# Patient Record
Sex: Female | Born: 1950
Health system: Southern US, Community
[De-identification: ages and names within clinical notes are randomized; demographics above are authoritative.]

## PROBLEM LIST (undated history)

## (undated) DIAGNOSIS — M1990 Unspecified osteoarthritis, unspecified site: Secondary | ICD-10-CM

## (undated) DIAGNOSIS — R51 Headache: Secondary | ICD-10-CM

## (undated) DIAGNOSIS — I499 Cardiac arrhythmia, unspecified: Secondary | ICD-10-CM

## (undated) DIAGNOSIS — M109 Gout, unspecified: Secondary | ICD-10-CM

## (undated) DIAGNOSIS — R011 Cardiac murmur, unspecified: Secondary | ICD-10-CM

## (undated) DIAGNOSIS — J189 Pneumonia, unspecified organism: Secondary | ICD-10-CM

## (undated) DIAGNOSIS — D649 Anemia, unspecified: Secondary | ICD-10-CM

## (undated) DIAGNOSIS — E039 Hypothyroidism, unspecified: Secondary | ICD-10-CM

## (undated) DIAGNOSIS — R0602 Shortness of breath: Secondary | ICD-10-CM

## (undated) DIAGNOSIS — K219 Gastro-esophageal reflux disease without esophagitis: Secondary | ICD-10-CM

## (undated) DIAGNOSIS — E119 Type 2 diabetes mellitus without complications: Secondary | ICD-10-CM

## (undated) DIAGNOSIS — K759 Inflammatory liver disease, unspecified: Secondary | ICD-10-CM

## (undated) DIAGNOSIS — N052 Unspecified nephritic syndrome with diffuse membranous glomerulonephritis: Secondary | ICD-10-CM

## (undated) DIAGNOSIS — I6529 Occlusion and stenosis of unspecified carotid artery: Secondary | ICD-10-CM

## (undated) DIAGNOSIS — I209 Angina pectoris, unspecified: Secondary | ICD-10-CM

## (undated) DIAGNOSIS — E7800 Pure hypercholesterolemia, unspecified: Secondary | ICD-10-CM

## (undated) DIAGNOSIS — I10 Essential (primary) hypertension: Secondary | ICD-10-CM

## (undated) DIAGNOSIS — G4730 Sleep apnea, unspecified: Secondary | ICD-10-CM

## (undated) HISTORY — PX: TONSILLECTOMY: SUR1361

## (undated) HISTORY — PX: BUNIONECTOMY: SHX129

## (undated) HISTORY — PX: RENAL BIOPSY: SHX156

## (undated) HISTORY — PX: CHOLECYSTECTOMY: SHX55

---
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
## 2001-06-08 ENCOUNTER — Encounter: Payer: Self-pay | Admitting: Family Medicine

## 2001-06-08 ENCOUNTER — Ambulatory Visit (HOSPITAL_COMMUNITY): Admission: RE | Admit: 2001-06-08 | Discharge: 2001-06-08 | Payer: Self-pay | Admitting: Family Medicine

## 2001-06-21 ENCOUNTER — Ambulatory Visit (HOSPITAL_COMMUNITY): Admission: RE | Admit: 2001-06-21 | Discharge: 2001-06-21 | Payer: Self-pay | Admitting: General Surgery

## 2001-10-09 ENCOUNTER — Ambulatory Visit (HOSPITAL_COMMUNITY): Admission: RE | Admit: 2001-10-09 | Discharge: 2001-10-10 | Payer: Self-pay | Admitting: Nephrology

## 2002-06-08 ENCOUNTER — Ambulatory Visit (HOSPITAL_COMMUNITY): Admission: RE | Admit: 2002-06-08 | Discharge: 2002-06-08 | Payer: Self-pay | Admitting: Family Medicine

## 2002-06-08 ENCOUNTER — Encounter: Payer: Self-pay | Admitting: Family Medicine

## 2003-06-13 ENCOUNTER — Encounter: Payer: Self-pay | Admitting: Family Medicine

## 2003-06-13 ENCOUNTER — Ambulatory Visit (HOSPITAL_COMMUNITY): Admission: RE | Admit: 2003-06-13 | Discharge: 2003-06-13 | Payer: Self-pay | Admitting: Family Medicine

## 2003-07-09 ENCOUNTER — Ambulatory Visit (HOSPITAL_COMMUNITY): Admission: RE | Admit: 2003-07-09 | Discharge: 2003-07-09 | Payer: Self-pay | Admitting: *Deleted

## 2003-08-15 ENCOUNTER — Ambulatory Visit (HOSPITAL_COMMUNITY): Admission: RE | Admit: 2003-08-15 | Discharge: 2003-08-15 | Payer: Self-pay | Admitting: Obstetrics and Gynecology

## 2004-08-27 ENCOUNTER — Ambulatory Visit (HOSPITAL_COMMUNITY): Admission: RE | Admit: 2004-08-27 | Discharge: 2004-08-27 | Payer: Self-pay | Admitting: Family Medicine

## 2005-07-02 ENCOUNTER — Inpatient Hospital Stay (HOSPITAL_COMMUNITY): Admission: EM | Admit: 2005-07-02 | Discharge: 2005-07-04 | Payer: Self-pay | Admitting: Emergency Medicine

## 2005-08-31 ENCOUNTER — Ambulatory Visit (HOSPITAL_COMMUNITY): Admission: RE | Admit: 2005-08-31 | Discharge: 2005-08-31 | Payer: Self-pay | Admitting: Family Medicine

## 2006-06-09 ENCOUNTER — Other Ambulatory Visit: Admission: RE | Admit: 2006-06-09 | Discharge: 2006-06-09 | Payer: Self-pay | Admitting: Obstetrics and Gynecology

## 2006-07-07 ENCOUNTER — Ambulatory Visit (HOSPITAL_COMMUNITY): Admission: RE | Admit: 2006-07-07 | Discharge: 2006-07-07 | Payer: Self-pay | Admitting: General Surgery

## 2006-09-02 ENCOUNTER — Ambulatory Visit (HOSPITAL_COMMUNITY): Admission: RE | Admit: 2006-09-02 | Discharge: 2006-09-02 | Payer: Self-pay | Admitting: Family Medicine

## 2006-11-20 ENCOUNTER — Emergency Department (HOSPITAL_COMMUNITY): Admission: EM | Admit: 2006-11-20 | Discharge: 2006-11-20 | Payer: Self-pay | Admitting: Emergency Medicine

## 2006-12-29 ENCOUNTER — Encounter (HOSPITAL_COMMUNITY): Admission: RE | Admit: 2006-12-29 | Discharge: 2007-01-28 | Payer: Self-pay | Admitting: Internal Medicine

## 2007-01-03 ENCOUNTER — Ambulatory Visit (HOSPITAL_COMMUNITY): Admission: RE | Admit: 2007-01-03 | Discharge: 2007-01-03 | Payer: Self-pay | Admitting: Internal Medicine

## 2007-06-29 ENCOUNTER — Ambulatory Visit (HOSPITAL_COMMUNITY): Admission: RE | Admit: 2007-06-29 | Discharge: 2007-06-29 | Payer: Self-pay | Admitting: Family Medicine

## 2007-08-24 HISTORY — PX: SHOULDER ARTHROSCOPY W/ ROTATOR CUFF REPAIR: SHX2400

## 2007-09-05 ENCOUNTER — Ambulatory Visit (HOSPITAL_COMMUNITY): Admission: RE | Admit: 2007-09-05 | Discharge: 2007-09-05 | Payer: Self-pay | Admitting: Obstetrics and Gynecology

## 2007-12-12 ENCOUNTER — Ambulatory Visit (HOSPITAL_COMMUNITY): Admission: RE | Admit: 2007-12-12 | Discharge: 2007-12-12 | Payer: Self-pay | Admitting: Family Medicine

## 2008-05-31 ENCOUNTER — Ambulatory Visit (HOSPITAL_BASED_OUTPATIENT_CLINIC_OR_DEPARTMENT_OTHER): Admission: RE | Admit: 2008-05-31 | Discharge: 2008-06-01 | Payer: Self-pay | Admitting: Orthopedic Surgery

## 2008-07-15 ENCOUNTER — Encounter (HOSPITAL_COMMUNITY): Admission: RE | Admit: 2008-07-15 | Discharge: 2008-08-21 | Payer: Self-pay | Admitting: Orthopedic Surgery

## 2008-07-19 ENCOUNTER — Ambulatory Visit (HOSPITAL_COMMUNITY): Admission: RE | Admit: 2008-07-19 | Discharge: 2008-07-19 | Payer: Self-pay | Admitting: Family Medicine

## 2008-08-26 ENCOUNTER — Encounter (HOSPITAL_COMMUNITY): Admission: RE | Admit: 2008-08-26 | Discharge: 2008-09-25 | Payer: Self-pay | Admitting: Orthopedic Surgery

## 2008-09-11 ENCOUNTER — Ambulatory Visit (HOSPITAL_COMMUNITY): Admission: RE | Admit: 2008-09-11 | Discharge: 2008-09-11 | Payer: Self-pay | Admitting: Family Medicine

## 2008-09-26 ENCOUNTER — Encounter (HOSPITAL_COMMUNITY): Admission: RE | Admit: 2008-09-26 | Discharge: 2008-10-26 | Payer: Self-pay | Admitting: Orthopedic Surgery

## 2008-10-29 ENCOUNTER — Encounter (HOSPITAL_COMMUNITY): Admission: RE | Admit: 2008-10-29 | Discharge: 2008-11-01 | Payer: Self-pay | Admitting: Orthopedic Surgery

## 2009-02-02 ENCOUNTER — Emergency Department (HOSPITAL_COMMUNITY): Admission: EM | Admit: 2009-02-02 | Discharge: 2009-02-02 | Payer: Self-pay | Admitting: Emergency Medicine

## 2009-03-17 ENCOUNTER — Ambulatory Visit (HOSPITAL_COMMUNITY): Admission: RE | Admit: 2009-03-17 | Discharge: 2009-03-17 | Payer: Self-pay | Admitting: Family Medicine

## 2009-09-22 ENCOUNTER — Ambulatory Visit (HOSPITAL_COMMUNITY)
Admission: RE | Admit: 2009-09-22 | Discharge: 2009-09-22 | Payer: Self-pay | Source: Home / Self Care | Admitting: Family Medicine

## 2010-09-10 ENCOUNTER — Other Ambulatory Visit (HOSPITAL_COMMUNITY): Payer: Self-pay | Admitting: Obstetrics and Gynecology

## 2010-09-10 DIAGNOSIS — Z139 Encounter for screening, unspecified: Secondary | ICD-10-CM

## 2010-09-24 ENCOUNTER — Ambulatory Visit (HOSPITAL_COMMUNITY): Admission: RE | Admit: 2010-09-24 | Payer: 59 | Source: Home / Self Care | Admitting: Obstetrics and Gynecology

## 2010-09-24 ENCOUNTER — Ambulatory Visit (HOSPITAL_COMMUNITY)
Admission: RE | Admit: 2010-09-24 | Discharge: 2010-09-24 | Disposition: A | Discharge status: Home / Self Care | Payer: 59 | Source: Ambulatory Visit | Attending: Obstetrics and Gynecology | Admitting: Obstetrics and Gynecology

## 2010-09-24 DIAGNOSIS — Z1231 Encounter for screening mammogram for malignant neoplasm of breast: Secondary | ICD-10-CM | POA: Insufficient documentation

## 2010-09-24 DIAGNOSIS — Z139 Encounter for screening, unspecified: Secondary | ICD-10-CM

## 2011-01-05 NOTE — Op Note (Signed)
NAMEFELICIE, [name]                  ACCOUNT NO.:  [id number]   MEDICAL RECORD NO.:  [id number]          PATIENT TYPE:  AMB   LOCATION:  [place]                          FACILITY:  [facility]   PHYSICIAN:  Johnny Bridge, MD    DATE OF BIRTH:  February 04, 1951   DATE OF PROCEDURE:  05/31/2008  DATE OF DISCHARGE:                               OPERATIVE REPORT   PREOPERATIVE DIAGNOSIS:  Right shoulder rotator cuff tear and  impingement with acromioclavicular joint arthritis.   POSTOPERATIVE DIAGNOSES:  1. Right shoulder rotator cuff tear and impingement with      acromioclavicular joint arthritis.  2. Biceps tendinosis.   OPERATIVE PROCEDURE:  Right shoulder arthroscopy with rotator cuff  repair, acromioplasty, distal clavicle excision, and biceps tenolysis.   ANESTHESIA:  General with interscalene block.   OPERATIVE IMPLANTS:  Arthrex PEEK 5.5 anchors x2 and PushLock 4.5  anchors x2 for lateral row.   PREOPERATIVE INDICATIONS:  Ms. [name] is a 60 year old woman who  complained of right shoulder pain.  She had impingement as well as  rotator cuff tear, AC joint arthritis.  She elected to undergo the above-  named procedures.  The risks, benefits, and alternatives were discussed  with her preoperatively including but not limited to risks of infection,  bleeding, nerve injury, recurrent rotator cuff tear, stiffness, adhesive  capsulitis, recurrent pain, cardiopulmonary complications, blood clots,  among others and she was willing to proceed.   OPERATIVE FINDINGS:  The glenohumeral articular surface was normal.  The  labrum was intact.  The biceps had significant fraying especially along  the intertubercular groove portion when pulled into the joint.  There  was at least 50% involvement of the tendon also with synovitis and  tendinosis going distally.  For this reason, I performed a biceps  tenolysis, because I felt that the biceps would likely continue to  degenerate and cause pain  postoperatively if left as it was.   The rotator cuff had a 2 x 2 cm tear in the supraspinatus.  Infraspinatus and subscapularis were intact.  The quality of the tissue  was reasonable, although not great.  There was a large subacromial spur.  [facility] joint had significant arthritis.   OPERATIVE PROCEDURE:  The patient was brought to the operating room and  placed in a supine position.  General anesthesia was administered.  Regional block had already been administered.  She was turned in a  sloppy lateral decubitus position, and diagnostic arthroscopy was  carried out after sterile prep and drape.  The above-named findings were  noted.  The biceps was tenolysed.  The shaver was utilized to debride  the bone on the greater tuberosity in order to prepare a bony bed for  the rotator cuff repair.  We then debrided the portion of the rotator  cuff of the worst tissue.  We placed a total of 2 anchors for the medial  row and passed then using modified Mason-Allen type stitch.  We then  anchored the tendon to the bone.  We then placed 2 lateral PushLock  anchors utilizing  the suture.  Excellent recreation of the footprint was  achieved.   We then performed an acromioplasty and released the CA ligament.  The  bone spur was excised.  We then used the bur to perform a distal  clavicle excision.  Confirmation was made of acceptable resection on  multiple views from multiple portals.  We then irrigated the shoulder  out copiously and closed the skin with Monocryl and Steri-Strips and  sterile gauze.  The patient was awakened and returned to PACU in stable  and satisfactory condition.  There were no complications.  The patient  tolerated the procedure well.      Johnny Bridge, MD  Electronically Signed     JPL/MEDQ  D:  05/31/2008  T:  06/01/2008  Job:  [id number]

## 2011-01-08 NOTE — H&P (Signed)
NAMEJANECIA, [name]                  ACCOUNT NO.:  [id number]   MEDICAL RECORD NO.:  [id number]          PATIENT TYPE:  INP   LOCATION:  A213                          FACILITY:  [facility]   PHYSICIAN:  Bonne Dolores, M.D.    DATE OF BIRTH:  09/20/1950   DATE OF ADMISSION:  07/02/2005  DATE OF DISCHARGE:  LH                                HISTORY & PHYSICAL   CHIEF COMPLAINT:  Chest pain.   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 She is managed by  nephrology in [place]. She also has a history of recurring chest pain and  has undergone Cardiolite stress testing most recently two years ago and has  been followed by Dr. Mathis Bud. She underwent a Doppler of her carotid  arteries within the past month for a bruit, the results of which are  unknown.   PAST SURGICAL HISTORY:  1.  Significant for cholecystitis.  2.  Cesarean section x3.  3.  Bunionectomy x2.  4.  Lasix surgery.  5.  Tonsillectomy.   PAST MEDICAL HISTORY:  1.  She also has a history of gastroesophageal reflux disease.  2.  She is treated chronically for hyperlipidemia as well, medication      regimen noted below.  3.  She has a history of hypothyroidism, compensated.   The patient presented to the emergency department with a one-hour history of  facial flushing, hard heart beat with a heart rate of 100 per her  measurement, right shoulder and upper arm discomfort as well as chest  tightness. She has associated dyspnea which was mild. However, she  experienced no diaphoresis, nausea, syncope.   Emergency room evaluation was essentially benign except for mild  hypokalemia. Cardiac enzymes negative. EKG essentially normal.   The patient is admitted with chest pain and rule out MI protocol. Given her  history and risk factors (noted below), cardiac catheterization will  probably be required.   There is no history of headache,  neurological deficits, nausea, vomiting,  diarrhea, melena, hematemesis, hematochezia or genitourinary symptoms.   MEDICATIONS:  1.  Lisinopril 40 mg p.o. b.i.d.  2.  Protonix 40 mg daily.  3.  Synthroid 0.125 daily.  4.  Prempro 3/1.5 daily.  5.  Lasix 20 mg daily.  6.  Toprol-XL 50 daily.  7.  Aspirin 325 daily.  8.  Lipitor 80 daily.  9.  TriCor 145 nightly.  10. Micardis 80 mg daily.  11. Nitroglycerin p.r.n.  12. Fiorinal p.r.n.   ALLERGIES:  None known.   PAST HISTORY:  As noted above. She also has a history of anemia in the  remote past.   SOCIAL HISTORY:  Nonsmoker, nondrinker. Supportive family.   FAMILY HISTORY:  Family history is significant for multiple members of her  father's family with coronary artery disease. Her father also had coronary  artery disease. Her mother died of nonalcoholic related cirrhosis of the  liver at age 84.   REVIEW OF SYSTEMS:  Negative except as mentioned.   PHYSICAL EXAMINATION:  GENERAL:  Very pleasant, black female who is no acute  distress at this time. Her color is good.  VITAL SIGNS:  At presentation, BP 137/62, temperature 98.7, pulse 82 and  regular, respirations 20. O2 saturation 98%.  HEENT:  Normocephalic, atraumatic. Pupils are equal. Ears, nose, and throat  benign.  NECK:  Supple. I am unable to appreciate a bruit on either carotid.  LUNGS:  Clear.  HEART:  Normal without murmurs, rubs, or gallops.  ABDOMEN:  Nontender, nondistended. Bowel sounds were intact.  EXTREMITIES:  No clubbing, cyanosis, or edema.  NEUROLOGICAL:  Nonfocal.   LABORATORY DATA:  As noted. Second set of cardiac enzymes were negative.  Repeat EKG:  No acute change.   ASSESSMENT:  Chest pain associated with mild tachy palpitations of  questionable etiology. Consider atypical angina pectoris. This has been  recurring over the past several years and cardiac catheterization probably  indicated.   PLAN:  Will continue current medications. Rule out  MI protocol. Ask  cardiology to see in consultation. Consider transfer to [facility]  for emergent catheterization should her symptoms recur. Will follow and  treat expectantly.     Bonne Dolores, M.D.  Electronically Signed    MC/MEDQ  D:  07/03/2005  T:  07/03/2005  Job:  [id number]

## 2011-01-08 NOTE — Discharge Summary (Signed)
Katherine, Burke                  ACCOUNT NO.:  [id number]   MEDICAL RECORD NO.:  [id number]          PATIENT TYPE:  INP   LOCATION:  A213                          FACILITY:  [facility]   PHYSICIAN:  Bonne Dolores, M.D.    DATE OF BIRTH:  November 21, 1950   DATE OF ADMISSION:  07/02/2005  DATE OF DISCHARGE:  11/12/2006LH                                 DISCHARGE SUMMARY   DISCHARGE DIAGNOSES:  1.  Chest pain of questionable etiology, rule out myocardial infarction      protocol negative, symptoms resolved.  2.  History biopsy-proven membranous glomerulonephritis, stable.  3.  History of cholecystitis.  4.  Cesarean section x3.  5.  Bunionectomy x2.  6.  History of LASIK surgery.  7.  History of tonsillectomy.  8.  Gastroesophageal reflux disease, controlled.  9.  Chronic hyperlipidemia.  10. Hypothyroidism, compensated.  11. Hypertension, controlled.   HISTORY AND PHYSICAL:  For details regarding admission please refer to the  admitting note.  Briefly this 60 year old female with above history  presented to the emergency department with a 1-hour history of facial  flushing and a hard heartbeat.  She also experienced right shoulder and  upper arm discomfort as well as chest tightness.  She also had associated  dyspnea which was mild.  She experienced no diaphoresis, nausea, or syncope.   In the emergency department her evaluation was benign except for a mild  hypokalemia.  Cardiac enzymes negative.  EKG normal.   Patient was admitted with chest pain and rule out MI protocol.   COURSE IN THE HOSPITAL:  The patient was treated routinely, rule out  myocardial infarction protocol was negative.  Her medications were  unchanged.  She was anxious for discharge after 36 hours and was painfree.  Of note she is a patient of Dr. Mathis Bud, cardiac catheterization is  probably indicated however outpatient workup may be undertaken as per  cardiology.   DISPOSITION:  Medications include lisinopril 40  mg twice daily, Protonix 40  daily, Synthroid 125 mcg daily, Prempro 3/1.5 daily, Lasix 40 daily, Toprol  XL 50 daily, aspirin 325 daily, Lipitor 80 daily, Tricor 145 mg daily and  Micardis 80 mg daily, nitroglycerin p.r.n., Fiorinal p.r.n., fish oil one  tab daily.      Bonne Dolores, M.D.  Electronically Signed     MC/MEDQ  D:  07/25/2005  T:  07/25/2005  Job:  [id number]

## 2011-01-08 NOTE — H&P (Signed)
[name], [name]                  ACCOUNT NO.:  [id number]   MEDICAL RECORD NO.:  [id number]          PATIENT TYPE:  AMB   LOCATION:                                FACILITY:  [facility]   PHYSICIAN:  Jamesetta So, M.D.  DATE OF BIRTH:  [birth year]   DATE OF ADMISSION:  DATE OF DISCHARGE:  LH                                HISTORY & PHYSICAL   CHIEF COMPLAINT:  Diverticulosis, abdominal pain.   HISTORY OF PRESENT ILLNESS:  The patient is a 60 year old white female who  is referred for endoscopic evaluation.  She needs a colonoscopy for a  history of diverticulosis and left lower quadrant abdominal pain.  She last  had a colonoscopy in 2000 by Dr. Tamala Julian, she was told she had diverticulosis  at that time.  She has been having intermittent left lower quadrant  abdominal pain, though no fevers, chills, or diarrhea.  No weight loss,  nausea, vomiting, constipation, melena, or hematochezia have been noted.  There is no family history of colon carcinoma.  Past medical history  includes hypothyroidism, hypertension, coronary artery disease.   PAST SURGICAL HISTORY:  Cholecystectomy, bunionectomy, temperature, two C-  sections.   CURRENT MEDICATIONS:  Lisinopril, Synthroid, Protonix, Lasix, Prempro,  aspirin, Micardis, Tricor, Lipitor, lisinopril, Cardizem.   ALLERGIES:  CODEINE, NONSTEROIDAL ANTI-INFLAMMATORY DRUGS.   REVIEW OF SYSTEMS:  Noncontributory.   PHYSICAL EXAMINATION:  On physical examination, the patient is well-  developed, well-nourished white female in no acute distress.  LUNGS:  Clear to auscultation with equal breath sounds bilaterally.  HEART:  Examination reveals regular rate and rhythm without S3, S4, or  murmurs.  The abdomen is soft, nontender, nondistended.  No hepatosplenomegaly or  masses are noted.  RECTAL:  Examination was deferred to the procedure.   IMPRESSION:  Diverticulosis, abdominal pain.   PLAN:  The patient is scheduled for a colonoscopy on June 28, 2006.  Risks and benefits of the procedure including bleeding and perforation were  fully explained to the patient, who gave informed consent.      Jamesetta So, M.D.  Electronically Signed     MAJ/MEDQ  D:  06/07/2006  T:  06/08/2006  Job:  [id number]   cc:   [facility]  Fax: [phone]   Halford Chessman, M.D.  Fax: [phone]

## 2011-02-11 ENCOUNTER — Encounter (HOSPITAL_COMMUNITY): Payer: Self-pay

## 2011-02-11 ENCOUNTER — Other Ambulatory Visit (HOSPITAL_COMMUNITY): Payer: Self-pay | Admitting: Family Medicine

## 2011-02-11 ENCOUNTER — Ambulatory Visit (HOSPITAL_COMMUNITY)
Admission: RE | Admit: 2011-02-11 | Discharge: 2011-02-11 | Disposition: A | Discharge status: Home / Self Care | Payer: 59 | Source: Ambulatory Visit | Attending: Family Medicine | Admitting: Family Medicine

## 2011-02-11 DIAGNOSIS — M25569 Pain in unspecified knee: Secondary | ICD-10-CM

## 2011-02-11 DIAGNOSIS — S99919A Unspecified injury of unspecified ankle, initial encounter: Secondary | ICD-10-CM | POA: Insufficient documentation

## 2011-02-11 DIAGNOSIS — W19XXXA Unspecified fall, initial encounter: Secondary | ICD-10-CM | POA: Insufficient documentation

## 2011-02-11 DIAGNOSIS — S8990XA Unspecified injury of unspecified lower leg, initial encounter: Secondary | ICD-10-CM | POA: Insufficient documentation

## 2011-02-11 DIAGNOSIS — S99929A Unspecified injury of unspecified foot, initial encounter: Secondary | ICD-10-CM | POA: Insufficient documentation

## 2011-05-25 LAB — BASIC METABOLIC PANEL
BUN: 31 — ABNORMAL HIGH
CO2: 27
Calcium: 10
Chloride: 108
Creatinine, Ser: 1.11
GFR calc Af Amer: >60
GFR calc non Af Amer: 51 — ABNORMAL LOW
Glucose, Bld: 114 — ABNORMAL HIGH
Potassium: 4.4
Sodium: 140

## 2011-05-25 LAB — POCT HEMOGLOBIN-HEMACUE: Hemoglobin: 12

## 2011-05-25 LAB — GLUCOSE, CAPILLARY: Glucose-Capillary: 169 — ABNORMAL HIGH

## 2011-08-25 ENCOUNTER — Other Ambulatory Visit (HOSPITAL_COMMUNITY): Payer: Self-pay | Admitting: Family Medicine

## 2011-08-25 DIAGNOSIS — Z139 Encounter for screening, unspecified: Secondary | ICD-10-CM

## 2011-09-28 ENCOUNTER — Ambulatory Visit (HOSPITAL_COMMUNITY)
Admission: RE | Admit: 2011-09-28 | Discharge: 2011-09-28 | Disposition: A | Discharge status: Home / Self Care | Payer: 59 | Source: Ambulatory Visit | Attending: Family Medicine | Admitting: Family Medicine

## 2011-09-28 DIAGNOSIS — Z1231 Encounter for screening mammogram for malignant neoplasm of breast: Secondary | ICD-10-CM | POA: Insufficient documentation

## 2011-09-28 DIAGNOSIS — Z139 Encounter for screening, unspecified: Secondary | ICD-10-CM

## 2011-11-07 ENCOUNTER — Emergency Department (HOSPITAL_COMMUNITY)
Admission: EM | Admit: 2011-11-07 | Discharge: 2011-11-07 | Disposition: A | Discharge status: Home / Self Care | Payer: 59 | Attending: Emergency Medicine | Admitting: Emergency Medicine

## 2011-11-07 ENCOUNTER — Encounter (HOSPITAL_COMMUNITY): Payer: Self-pay | Admitting: Emergency Medicine

## 2011-11-07 DIAGNOSIS — E079 Disorder of thyroid, unspecified: Secondary | ICD-10-CM | POA: Insufficient documentation

## 2011-11-07 DIAGNOSIS — K529 Noninfective gastroenteritis and colitis, unspecified: Secondary | ICD-10-CM

## 2011-11-07 DIAGNOSIS — K5289 Other specified noninfective gastroenteritis and colitis: Secondary | ICD-10-CM | POA: Insufficient documentation

## 2011-11-07 DIAGNOSIS — E119 Type 2 diabetes mellitus without complications: Secondary | ICD-10-CM | POA: Insufficient documentation

## 2011-11-07 DIAGNOSIS — I10 Essential (primary) hypertension: Secondary | ICD-10-CM | POA: Insufficient documentation

## 2011-11-07 DIAGNOSIS — E7800 Pure hypercholesterolemia, unspecified: Secondary | ICD-10-CM | POA: Insufficient documentation

## 2011-11-07 DIAGNOSIS — N289 Disorder of kidney and ureter, unspecified: Secondary | ICD-10-CM | POA: Insufficient documentation

## 2011-11-07 HISTORY — DX: Pure hypercholesterolemia, unspecified: E78.00

## 2011-11-07 HISTORY — DX: Essential (primary) hypertension: I10

## 2011-11-07 LAB — CBC
HCT: 37.4 % (ref 36.0–46.0)
Hemoglobin: 13.1 g/dL (ref 12.0–15.0)
MCHC: 35 g/dL (ref 30.0–36.0)
MCV: 87.2 fL (ref 78.0–100.0)
RBC: 4.29 MIL/uL (ref 3.87–5.11)
WBC: 6.7 10*3/uL (ref 4.0–10.5)

## 2011-11-07 LAB — COMPREHENSIVE METABOLIC PANEL
ALT: 52 U/L — ABNORMAL HIGH (ref 0–35)
AST: 36 U/L (ref 0–37)
Albumin: 3.3 g/dL — ABNORMAL LOW (ref 3.5–5.2)
Alkaline Phosphatase: 43 U/L (ref 39–117)
BUN: 18 mg/dL (ref 6–23)
CO2: 22 mEq/L (ref 19–32)
Chloride: 107 mEq/L (ref 96–112)
GFR calc non Af Amer: 60 mL/min — ABNORMAL LOW (ref >90)
Glucose, Bld: 134 mg/dL — ABNORMAL HIGH (ref 70–99)
Potassium: 3.5 mEq/L (ref 3.5–5.1)
Total Bilirubin: 0.3 mg/dL (ref 0.3–1.2)

## 2011-11-07 LAB — DIFFERENTIAL
Basophils Relative: 0 % (ref 0–1)
Eosinophils Absolute: 0.2 10*3/uL (ref 0.0–0.7)
Lymphocytes Relative: 26 % (ref 12–46)
Monocytes Relative: 9 % (ref 3–12)
Neutro Abs: 4.2 10*3/uL (ref 1.7–7.7)
Neutrophils Relative %: 62 % (ref 43–77)

## 2011-11-07 MED ORDER — PROMETHAZINE HCL 25 MG PO TABS: 25.0000 mg | ORAL_TABLET | Freq: Four times a day (QID) | ORAL | Status: AC | PRN

## 2011-11-07 NOTE — ED Provider Notes (Signed)
This chart was scribed for [name], MD by Toniann Ket. The patient was seen in room APA05/APA05 and the patient's care was started at 9:11 AM.   CSN: [id number]  Arrival date & time 11/07/11  0851   First MD Initiated Contact with Patient 11/07/11 [phone]      Chief Complaint  Patient presents with  . Nausea  . Diarrhea    (Consider location/radiation/quality/duration/timing/severity/associated sxs/prior treatment) Patient is a 61 y.o. female presenting with diarrhea. The history is provided by the patient. No language interpreter was used.  Diarrhea The primary symptoms include fever, fatigue, nausea, vomiting and diarrhea. Primary symptoms do not include abdominal pain or rash. The illness began 3 to 5 days ago. The onset was sudden. The problem has been gradually worsening.  Nausea began 3 to 5 days ago.  The illness is also significant for chills. The illness does not include back pain. Associated medical issues do not include gastric bypass. Risk factors: none.   [name] is a 61 y.o. female who presents to the Emergency Department complaining of sudden onset, waxing and waning, gradually worsening, moderate nausea onset 4 days ago that began after pt came home from home from work. Pt c/o associated chills, emesis, fatigue, dry heaves,  mild diarrhea, intermittent low-grade fever.  Pt stated that yesterday she was feeling better, but sx have worsened today. Pt denies any sick contacts.There are no other associated symptoms and no other alleviating or aggravating factors.  Pt w/ renal disorder and DM.   PCP: Hilma Favors Past Medical History  Diagnosis Date  . Renal disorder   . Diabetes mellitus   . Hypertension   . High cholesterol   . Thyroid disease     Past Surgical History  Procedure Date  . Cholecystectomy   . Cesarean section   . Rotator cuff repair   . Bunionectomy     No family history on file.  History  Substance Use Topics  . Smoking status:  Never Smoker   . Smokeless tobacco: Not on file  . Alcohol Use: No    OB History    Grav Para Term Preterm Abortions TAB SAB Ect Mult Living                  Review of Systems  Constitutional: Positive for fever, chills and fatigue.  HENT: Negative for congestion, sinus pressure and ear discharge.   Eyes: Negative for discharge.  Respiratory: Negative for cough.   Cardiovascular: Negative for chest pain.  Gastrointestinal: Positive for nausea, vomiting and diarrhea. Negative for abdominal pain.  Genitourinary: Negative for frequency and hematuria.  Musculoskeletal: Negative for back pain.  Skin: Negative for rash.  Neurological: Negative for seizures and headaches.  Hematological: Negative.   Psychiatric/Behavioral: Negative for hallucinations.    Allergies  Codeine and Nsaids  Home Medications  No current outpatient prescriptions on file.  BP 122/55  Pulse 81  Temp 98.4 F (36.9 C)  Resp 18  Ht 5\' 3"  (1.6 m)  Wt 160 lb (72.576 kg)  BMI 28.34 kg/m2  SpO2 95%  Physical Exam  Constitutional: She is oriented to person, place, and time. She appears well-developed.  HENT:  Head: Normocephalic and atraumatic.  Eyes: Conjunctivae and EOM are normal. No scleral icterus.  Neck: Neck supple. No thyromegaly present.  Cardiovascular: Normal rate and regular rhythm.  Exam reveals no gallop and no friction rub.   No murmur heard. Pulmonary/Chest: No stridor. She has no wheezes. She has no  rales. She exhibits no tenderness.  Abdominal: She exhibits no distension. There is no tenderness. There is no rebound.  Musculoskeletal: Normal range of motion. She exhibits no edema.  Lymphadenopathy:    She has no cervical adenopathy.  Neurological: She is oriented to person, place, and time. Coordination normal.  Skin: No rash noted. No erythema.  Psychiatric: She has a normal mood and affect. Her behavior is normal.    ED Course  Procedures (including critical care  time) DIAGNOSTIC STUDIES: Oxygen Saturation is 95% on room air, adequate by my interpretation.    COORDINATION OF CARE:  11:49 AM: EDP at bedside. All results reviewed and discussed with pt, questions answered, pt agreeable with plan.   Labs Reviewed  COMPREHENSIVE METABOLIC PANEL - Abnormal; Notable for the following:    Glucose, Bld 134 (*)    Albumin 3.3 (*)    ALT 52 (*)    GFR calc non Af Amer 60 (*)    GFR calc Af Amer 70 (*)    All other components within normal limits  CBC  DIFFERENTIAL   No results found.   No diagnosis found.    MDM  gastroenteritis  The chart was scribed for me under my direct supervision.  I personally performed the history, physical, and medical decision making and all procedures in the evaluation of this patient.[name], MD 11/07/11 1153

## 2011-11-07 NOTE — ED Notes (Signed)
Family at bedside. Patient is comfortable. Doctor in at this time.

## 2011-11-07 NOTE — ED Notes (Signed)
Pt c/o intermittent n/d/dry heaves since Wednesday. denies pain

## 2011-11-07 NOTE — ED Notes (Signed)
Family at bedside. Patient walked to restroom and back with no assistance needed.

## 2011-11-07 NOTE — Discharge Instructions (Signed)
Drink plenty of fluids.  Follow up if any problems

## 2012-01-10 ENCOUNTER — Other Ambulatory Visit (HOSPITAL_COMMUNITY): Payer: Self-pay | Admitting: Family Medicine

## 2012-01-10 DIAGNOSIS — Z01419 Encounter for gynecological examination (general) (routine) without abnormal findings: Secondary | ICD-10-CM

## 2012-01-10 DIAGNOSIS — Z139 Encounter for screening, unspecified: Secondary | ICD-10-CM

## 2012-01-19 ENCOUNTER — Other Ambulatory Visit (HOSPITAL_COMMUNITY): Payer: 59

## 2012-01-19 ENCOUNTER — Inpatient Hospital Stay (HOSPITAL_COMMUNITY): Admission: RE | Admit: 2012-01-19 | Payer: 59 | Source: Ambulatory Visit

## 2012-01-24 ENCOUNTER — Ambulatory Visit (HOSPITAL_COMMUNITY)
Admission: RE | Admit: 2012-01-24 | Discharge: 2012-01-24 | Disposition: A | Discharge status: Home / Self Care | Payer: 59 | Source: Ambulatory Visit | Attending: Family Medicine | Admitting: Family Medicine

## 2012-01-24 DIAGNOSIS — Z13820 Encounter for screening for osteoporosis: Secondary | ICD-10-CM | POA: Insufficient documentation

## 2012-01-24 DIAGNOSIS — Z139 Encounter for screening, unspecified: Secondary | ICD-10-CM

## 2012-01-24 DIAGNOSIS — Z01419 Encounter for gynecological examination (general) (routine) without abnormal findings: Secondary | ICD-10-CM

## 2012-01-24 DIAGNOSIS — M899 Disorder of bone, unspecified: Secondary | ICD-10-CM | POA: Insufficient documentation

## 2012-09-05 ENCOUNTER — Other Ambulatory Visit (HOSPITAL_COMMUNITY): Payer: Self-pay | Admitting: Obstetrics and Gynecology

## 2012-09-05 DIAGNOSIS — IMO0001 Reserved for inherently not codable concepts without codable children: Secondary | ICD-10-CM

## 2012-09-29 ENCOUNTER — Ambulatory Visit (HOSPITAL_COMMUNITY)
Admission: RE | Admit: 2012-09-29 | Discharge: 2012-09-29 | Disposition: A | Discharge status: Home / Self Care | Payer: 59 | Source: Ambulatory Visit | Attending: Obstetrics and Gynecology | Admitting: Obstetrics and Gynecology

## 2012-09-29 DIAGNOSIS — Z1231 Encounter for screening mammogram for malignant neoplasm of breast: Secondary | ICD-10-CM | POA: Insufficient documentation

## 2012-09-29 DIAGNOSIS — IMO0001 Reserved for inherently not codable concepts without codable children: Secondary | ICD-10-CM

## 2012-10-29 ENCOUNTER — Emergency Department (HOSPITAL_COMMUNITY): Payer: 59

## 2012-10-29 ENCOUNTER — Other Ambulatory Visit: Payer: Self-pay

## 2012-10-29 ENCOUNTER — Encounter (HOSPITAL_COMMUNITY): Payer: Self-pay | Admitting: *Deleted

## 2012-10-29 ENCOUNTER — Emergency Department (HOSPITAL_COMMUNITY)
Admission: EM | Admit: 2012-10-29 | Discharge: 2012-10-29 | Disposition: A | Discharge status: Home / Self Care | Payer: 59 | Attending: Emergency Medicine | Admitting: Emergency Medicine

## 2012-10-29 DIAGNOSIS — J189 Pneumonia, unspecified organism: Secondary | ICD-10-CM | POA: Insufficient documentation

## 2012-10-29 DIAGNOSIS — Z79899 Other long term (current) drug therapy: Secondary | ICD-10-CM | POA: Insufficient documentation

## 2012-10-29 DIAGNOSIS — Z9089 Acquired absence of other organs: Secondary | ICD-10-CM | POA: Insufficient documentation

## 2012-10-29 DIAGNOSIS — M7989 Other specified soft tissue disorders: Secondary | ICD-10-CM | POA: Insufficient documentation

## 2012-10-29 DIAGNOSIS — R079 Chest pain, unspecified: Secondary | ICD-10-CM

## 2012-10-29 DIAGNOSIS — R911 Solitary pulmonary nodule: Secondary | ICD-10-CM | POA: Insufficient documentation

## 2012-10-29 DIAGNOSIS — E119 Type 2 diabetes mellitus without complications: Secondary | ICD-10-CM | POA: Insufficient documentation

## 2012-10-29 DIAGNOSIS — I10 Essential (primary) hypertension: Secondary | ICD-10-CM | POA: Insufficient documentation

## 2012-10-29 DIAGNOSIS — R350 Frequency of micturition: Secondary | ICD-10-CM | POA: Insufficient documentation

## 2012-10-29 DIAGNOSIS — R110 Nausea: Secondary | ICD-10-CM | POA: Insufficient documentation

## 2012-10-29 DIAGNOSIS — Z7982 Long term (current) use of aspirin: Secondary | ICD-10-CM | POA: Insufficient documentation

## 2012-10-29 DIAGNOSIS — E7800 Pure hypercholesterolemia, unspecified: Secondary | ICD-10-CM | POA: Insufficient documentation

## 2012-10-29 DIAGNOSIS — Z87448 Personal history of other diseases of urinary system: Secondary | ICD-10-CM | POA: Insufficient documentation

## 2012-10-29 DIAGNOSIS — E079 Disorder of thyroid, unspecified: Secondary | ICD-10-CM | POA: Insufficient documentation

## 2012-10-29 LAB — BASIC METABOLIC PANEL
BUN: 14 mg/dL (ref 6–23)
Chloride: 103 mEq/L (ref 96–112)
Creatinine, Ser: 1.06 mg/dL (ref 0.50–1.10)
GFR calc Af Amer: 65 mL/min — ABNORMAL LOW (ref >90)
GFR calc non Af Amer: 56 mL/min — ABNORMAL LOW (ref >90)

## 2012-10-29 LAB — CBC WITH DIFFERENTIAL/PLATELET
Basophils Relative: 1 % (ref 0–1)
Eosinophils Absolute: 0.4 10*3/uL (ref 0.0–0.7)
HCT: 38.3 % (ref 36.0–46.0)
Hemoglobin: 13.3 g/dL (ref 12.0–15.0)
MCH: 30.7 pg (ref 26.0–34.0)
MCHC: 34.7 g/dL (ref 30.0–36.0)
MCV: 88.5 fL (ref 78.0–100.0)
Monocytes Absolute: 0.6 10*3/uL (ref 0.1–1.0)
Monocytes Relative: 7 % (ref 3–12)

## 2012-10-29 MED ORDER — AZITHROMYCIN 250 MG PO TABS: 250.0000 mg | ORAL_TABLET | Freq: Every day | ORAL | Status: DC

## 2012-10-29 MED ORDER — SODIUM CHLORIDE 0.9 % IV BOLUS (SEPSIS)
500.0000 mL | Freq: Once | INTRAVENOUS | Status: AC
  Administered 2012-10-29: 1000 mL via INTRAVENOUS

## 2012-10-29 MED ORDER — IOHEXOL 350 MG/ML SOLN
100.0000 mL | Freq: Once | INTRAVENOUS | Status: AC | PRN
  Administered 2012-10-29: 100 mL via INTRAVENOUS

## 2012-10-29 NOTE — ED Provider Notes (Signed)
History    This chart was scribed for [employer]. Alvino Chapel, MD by Marin Comment, ED Scribe. The patient was seen in room APA05/APA05. Patient's care was started at [place].   CSN: [id number]  Arrival date & time 10/29/12  [id number]   First MD Initiated Contact with Patient 10/29/12 0831      Chief Complaint  Patient presents with  . Abdominal Pain    The history is provided by the patient. No language interpreter was used.   [name] is a 62 y.o. female who presents to the Emergency Department complaining of gradually worsening, constant, right lower anterior rib pain that started 3 days ago. She states that the pain started under her right breast and has gradually moved to around her right ribs. Her pain is aggravated with breathing and movement, but improved with sitting up. She reports associated nausea but denies any vomiting. She denies any h/o similar symptoms. She denies any falls or recent trauma. She denies any new SOB, cough, rashes. She has a h/o renal disorder and states that she has had increased leg swelling and urinary frequency recently. She reports a h/o cholecystectomy.   Past Medical History  Diagnosis Date  . Renal disorder   . Diabetes mellitus   . Hypertension   . High cholesterol   . Thyroid disease     Past Surgical History  Procedure Laterality Date  . Cholecystectomy    . Cesarean section    . Rotator cuff repair    . Bunionectomy      No family history on file.  History  Substance Use Topics  . Smoking status: Never Smoker   . Smokeless tobacco: Not on file  . Alcohol Use: No    OB History   Grav Para Term Preterm Abortions TAB SAB Ect Mult Living                  Review of Systems  Respiratory: Negative for shortness of breath.   Cardiovascular: Positive for leg swelling.       Right lower rib pain.  Gastrointestinal: Positive for nausea. Negative for vomiting and abdominal pain.  Genitourinary: Positive for frequency.  All other  systems reviewed and are negative.    Allergies  Codeine; Nsaids; and Shellfish allergy  Home Medications   Current Outpatient Rx  Name  Route  Sig  Dispense  Refill  . allopurinol (ZYLOPRIM) 100 MG tablet   Oral   Take 200 mg by mouth daily.         Marland Kitchen aspirin EC 325 MG tablet   Oral   Take 325 mg by mouth daily.         Marland Kitchen dexlansoprazole (DEXILANT) 60 MG capsule   Oral   Take 60 mg by mouth daily.         Marland Kitchen diltiazem (CARDIZEM CD) 240 MG 24 hr capsule   Oral   Take 240 mg by mouth at bedtime.         . fenofibrate (TRICOR) 145 MG tablet   Oral   Take 145 mg by mouth daily.         . furosemide (LASIX) 20 MG tablet   Oral   Take 20 mg by mouth daily.         Marland Kitchen levothyroxine (SYNTHROID, LEVOTHROID) 125 MCG tablet   Oral   Take 125 mcg by mouth daily.         Marland Kitchen linagliptin (TRADJENTA) 5 MG TABS tablet  Oral   Take 5 mg by mouth daily.         Marland Kitchen lisinopril (PRINIVIL,ZESTRIL) 40 MG tablet   Oral   Take 40 mg by mouth 2 (two) times daily.         . metoprolol succinate (TOPROL-XL) 25 MG 24 hr tablet   Oral   Take 37.5 mg by mouth daily.         . Nutritional Supplements (ESTROVEN) TABS   Oral   Take 1 tablet by mouth daily.         . Omega-3 Fatty Acids (FISH OIL) 1200 MG CAPS   Oral   Take 2 capsules by mouth daily.          . rosuvastatin (CRESTOR) 40 MG tablet   Oral   Take 40 mg by mouth at bedtime.         Marland Kitchen telmisartan (MICARDIS) 80 MG tablet   Oral   Take 160 mg by mouth daily.         Marland Kitchen azithromycin (ZITHROMAX) 250 MG tablet   Oral   Take 1 tablet (250 mg total) by mouth daily. Take first 2 tablets together, then 1 every day until finished.   6 tablet   0     BP 121/63  Pulse 72  Temp(Src) 97.6 F (36.4 C) (Oral)  Resp 18  Ht 5\' 3"  (1.6 m)  Wt 165 lb (74.844 kg)  BMI 29.24 kg/m2  SpO2 90%  Physical Exam  Nursing note and vitals reviewed. Constitutional: She is oriented to person, place, and time.  She appears well-developed and well-nourished. No distress.  HENT:  Head: Normocephalic and atraumatic.  Eyes: Conjunctivae and EOM are normal.  Neck: Normal range of motion. Neck supple. No tracheal deviation present.  Cardiovascular: Normal rate, regular rhythm and normal heart sounds.   Pulmonary/Chest: Effort normal and breath sounds normal. No respiratory distress. She exhibits tenderness.  Point tenderness present in the anterior right lower chest wall. No crepitus, deformities or rashes noted. Lungs are clear bilaterally.    Abdominal: Soft. She exhibits no distension. There is no tenderness.  No RUQ abdominal tenderness.   Musculoskeletal: Normal range of motion. She exhibits edema.  Mild edema bilaterally in the lower legs  Neurological: She is alert and oriented to person, place, and time.  Skin: Skin is warm and dry.  Psychiatric: She has a normal mood and affect. Her behavior is normal.    ED Course  Procedures (including critical care time)  DIAGNOSTIC STUDIES: Oxygen Saturation is 95% on room air, adequate by my interpretation.    COORDINATION OF CARE:  08:40-Discussed planned course of treatment with the patient including a chest x-ray, basic blood work and checking a d-dimer, who is agreeable at this time.   Results for orders placed during the hospital encounter of 10/29/12  CBC WITH DIFFERENTIAL      Result Value Range   WBC 8.4  4.0 - 10.5 K/uL   RBC 4.33  3.87 - 5.11 MIL/uL   Hemoglobin 13.3  12.0 - 15.0 g/dL   HCT 38.3  36.0 - 46.0 %   MCV 88.5  78.0 - 100.0 fL   MCH 30.7  26.0 - 34.0 pg   MCHC 34.7  30.0 - 36.0 g/dL   RDW 13.6  11.5 - 15.5 %   Platelets 335  150 - 400 K/uL   Neutrophils Relative 69  43 - 77 %   Neutro Abs 5.7  1.7 - 7.7  K/uL   Lymphocytes Relative 19  12 - 46 %   Lymphs Abs 1.6  0.7 - 4.0 K/uL   Monocytes Relative 7  3 - 12 %   Monocytes Absolute 0.6  0.1 - 1.0 K/uL   Eosinophils Relative 5  0 - 5 %   Eosinophils Absolute 0.4  0.0  - 0.7 K/uL   Basophils Relative 1  0 - 1 %   Basophils Absolute 0.1  0.0 - 0.1 K/uL  BASIC METABOLIC PANEL      Result Value Range   Sodium 140  135 - 145 mEq/L   Potassium 4.1  3.5 - 5.1 mEq/L   Chloride 103  96 - 112 mEq/L   CO2 25  19 - 32 mEq/L   Glucose, Bld 195 (*) 70 - 99 mg/dL   BUN 14  6 - 23 mg/dL   Creatinine, Ser 1.06  0.50 - 1.10 mg/dL   Calcium 10.4  8.4 - 10.5 mg/dL   GFR calc non Af Amer 56 (*) >90 mL/min   GFR calc Af Amer 65 (*) >90 mL/min  D-DIMER, QUANTITATIVE      Result Value Range   D-Dimer, Quant 0.73 (*) 0.00 - 0.48 ug/mL-FEU    Dg Chest 2 View  10/29/2012  *RADIOLOGY REPORT*  Clinical Data: Right-sided chest pain since Thursday.  Shortness of breath.  CHEST - 2 VIEW  Comparison: 03/17/2009  Findings: Hyperinflation on the lateral view.  Accentuation of expected thoracic kyphosis. Lateral view degraded by patient arm position.  Remote right clavicular trauma.  Mild cardiomegaly. Atherosclerosis in the transverse aorta. No pleural effusion or pneumothorax.  Lung volumes are diminished on the frontal.  Left greater than right bibasilar volume loss. Apparent minimally increased opacity is favored to represent scarring, superimposed upon diminished lung volumes.  Upper lobes clear. No congestive failure.  IMPRESSION: Cardiomegaly and diminished lung volumes on the frontal.  Bibasilar opacities, left greater than right, favored to represent progressive scarring and volume loss.  If symptoms persist and there is a suspicion of infection, consider short-term radiographic follow-up.   Original Report Authenticated By: Abigail Miyamoto, M.D.    Ct Angio Chest W/cm &/or Wo Cm  10/29/2012  *RADIOLOGY REPORT*  Clinical Data: Shortness of breath, chest pain, positive D-dimer  CT ANGIOGRAPHY CHEST  Technique:  Multidetector CT imaging of the chest using the standard protocol during bolus administration of intravenous contrast. Multiplanar reconstructed images including MIPs were obtained  and reviewed to evaluate the vascular anatomy.  Contrast: 16mL OMNIPAQUE IOHEXOL 350 MG/ML SOLN  Comparison: Chest radiographs dated 10/29/2012  Findings: No evidence of pulmonary embolism.  Mild patchy opacity in the lingula (series 5/image 41), suspicious for pneumonia, less likely atelectasis / scarring.  Additional linear scarring/atelectasis in the left upper lobe and bilateral lower lobes.  4 mm subpleural nodule in the right lower lobe (series 5/image 45). Calcified granuloma in the left lower lobe (series 5/image 37).  No pleural effusion or pneumothorax.  Mild cardiomegaly.  No pericardial effusion.  Mild atherosclerotic calcifications of the aortic arch.  No suspicious mediastinal, hilar, or axillary lymphadenopathy.  Visualized upper abdomen is notable for severe hepatic steatosis.  Degenerative changes of the visualized thoracolumbar spine.  IMPRESSION: No evidence of pulmonary embolism.  Mild patchy lingular opacity, suspicious for pneumonia.  Additional areas of linear scarring/atelectasis in the left upper lobe and bilateral lower lobes.  4 mm subpleural nodule in the right lower lobe.  If this patient is high risk for primary  bronchogenic neoplasm, a single follow-up CT chest is suggested in 12 months.  If low risk, no dedicated follow- up imaging is required per Fleischner Society guidelines.  This recommendation follows the consensus statement: Guidelines for Management of Small Pulmonary Nodules Detected on CT Scans: A Statement from the [place] as published in Radiology 2005; 237:395-400.   Original Report Authenticated By: Julian Hy, M.D.      1. Chest pain   2. Lung nodule   3. CAP (community acquired pneumonia)     Date: 10/29/2012  Rate: 73  Rhythm: normal sinus rhythm  QRS Axis: normal  Intervals: normal  ST/T Wave abnormalities: normal  Conduction Disutrbances:none  Narrative Interpretation:   Old EKG Reviewed: none available     MDM  Patient  presents with sharp right-sided chest pain. Worse with breathing and movement. Patient had a positive d-dimer but negative CT angiography for pulmonary embolism. There is a lung nodule but would need to be followed. She also has possible pneumonia on the left lung. Patient is well-appearing be discharged home with antibiotics.   I personally performed the services described in this documentation, which was scribed in my presence. The recorded information has been reviewed and is accurate.        Jasper Riling. Alvino Chapel, MD 10/29/12 1504

## 2012-10-29 NOTE — ED Notes (Signed)
Pt presents to er with c/o right upper quad abd pain/rib cage pain that radiates around to right back area that started Thursday, is worse with respiration. Admits to nausea, denies any vomiting. Pt states that the pain remains constant but is better when she sits up.

## 2012-10-29 NOTE — ED Notes (Signed)
Patient is in CT. Family states they do not need anything at this time.

## 2012-10-29 NOTE — ED Notes (Signed)
Patient assisted to the restroom and back to room. Tolerated well.

## 2012-10-29 NOTE — ED Notes (Signed)
Iv attempted X2 without success, another RN to attempt placement,

## 2012-12-05 ENCOUNTER — Encounter (HOSPITAL_COMMUNITY): Payer: Self-pay | Admitting: Dietician

## 2012-12-05 NOTE — Progress Notes (Signed)
[facility] Diabetes Class Completion  Date:December 05, 2012  Time: 1000  Pt attended [facility]'s Diabetes Group Education Class on December 05, 2012.   Patient was educated on the following topics: survival skills (signs and symptoms of hyperglycemia and hypoglycemia, treatment for hypoglycemia, ideal levels for fasting and postprandial blood sugars, goal Hgb A1c level, foot care basics), recommendations for physical activity, carbohydrate metabolism in relation to diabetes, and meal planning (sources of carbohydrate, carbohydrate counting, meal planning strategies, food label reading, and portion control).   Joaquim Lai, RD, LDN

## 2013-01-11 ENCOUNTER — Other Ambulatory Visit (HOSPITAL_COMMUNITY): Payer: Self-pay | Admitting: Family Medicine

## 2013-01-11 DIAGNOSIS — Z0000 Encounter for general adult medical examination without abnormal findings: Secondary | ICD-10-CM

## 2013-01-16 ENCOUNTER — Ambulatory Visit (HOSPITAL_COMMUNITY)
Admission: RE | Admit: 2013-01-16 | Discharge: 2013-01-16 | Disposition: A | Discharge status: Home / Self Care | Payer: 59 | Source: Ambulatory Visit | Attending: Family Medicine | Admitting: Family Medicine

## 2013-01-16 DIAGNOSIS — Z0000 Encounter for general adult medical examination without abnormal findings: Secondary | ICD-10-CM

## 2013-01-16 DIAGNOSIS — R0989 Other specified symptoms and signs involving the circulatory and respiratory systems: Secondary | ICD-10-CM | POA: Insufficient documentation

## 2013-03-28 ENCOUNTER — Ambulatory Visit (INDEPENDENT_AMBULATORY_CARE_PROVIDER_SITE_OTHER): Payer: 59 | Admitting: Cardiovascular Disease

## 2013-03-28 VITALS — BP 128/78 | HR 90 | Ht 63.0 in | Wt 166.0 lb

## 2013-03-28 DIAGNOSIS — I119 Hypertensive heart disease without heart failure: Secondary | ICD-10-CM

## 2013-03-28 DIAGNOSIS — E119 Type 2 diabetes mellitus without complications: Secondary | ICD-10-CM

## 2013-03-28 DIAGNOSIS — G4733 Obstructive sleep apnea (adult) (pediatric): Secondary | ICD-10-CM

## 2013-03-28 DIAGNOSIS — E785 Hyperlipidemia, unspecified: Secondary | ICD-10-CM

## 2013-03-28 DIAGNOSIS — N052 Unspecified nephritic syndrome with diffuse membranous glomerulonephritis: Secondary | ICD-10-CM

## 2013-03-28 DIAGNOSIS — K219 Gastro-esophageal reflux disease without esophagitis: Secondary | ICD-10-CM

## 2013-03-28 DIAGNOSIS — E039 Hypothyroidism, unspecified: Secondary | ICD-10-CM

## 2013-03-28 DIAGNOSIS — I10 Essential (primary) hypertension: Secondary | ICD-10-CM

## 2013-03-28 MED ORDER — METOPROLOL SUCCINATE ER 50 MG PO TB24: 50.0000 mg | ORAL_TABLET | Freq: Every day | ORAL | Status: DC

## 2013-03-28 NOTE — Patient Instructions (Signed)
Your physician has recommended you make the following change in your medication: INCREASE metoprolol to 50 mg daily.  Your physician recommends that you schedule a follow-up appointment in: 1 year.

## 2013-04-08 ENCOUNTER — Encounter: Payer: Self-pay | Admitting: Cardiovascular Disease

## 2013-04-08 DIAGNOSIS — K219 Gastro-esophageal reflux disease without esophagitis: Secondary | ICD-10-CM | POA: Insufficient documentation

## 2013-04-08 DIAGNOSIS — G4733 Obstructive sleep apnea (adult) (pediatric): Secondary | ICD-10-CM | POA: Insufficient documentation

## 2013-04-08 DIAGNOSIS — I10 Essential (primary) hypertension: Secondary | ICD-10-CM | POA: Insufficient documentation

## 2013-04-08 DIAGNOSIS — E119 Type 2 diabetes mellitus without complications: Secondary | ICD-10-CM | POA: Insufficient documentation

## 2013-04-08 DIAGNOSIS — E785 Hyperlipidemia, unspecified: Secondary | ICD-10-CM | POA: Insufficient documentation

## 2013-04-08 DIAGNOSIS — N052 Unspecified nephritic syndrome with diffuse membranous glomerulonephritis: Secondary | ICD-10-CM | POA: Insufficient documentation

## 2013-04-08 DIAGNOSIS — E039 Hypothyroidism, unspecified: Secondary | ICD-10-CM | POA: Insufficient documentation

## 2013-04-08 NOTE — Progress Notes (Signed)
Patient ID: [name], female   DOB: [birth year], 62 y.o.   MRN: [id number]     HPI: [name], is a 62 y.o. female who presents for one-year cardiology evaluation.  Katherine. 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 Jimmy Footman. She also has a history of hypertension, hypothyroidism,  DM2, GERD as well as obstructive sleep apnea and only intermittently utilizes CPAP therapy. Her CPAP or it is moderate with an AHI of 20.9 but severe during REM sleep with an AHI of 54.3 which was noted on her initial sleep study in 2009.  She also has a history of hyperlipidemia and has been on combination therapy with fenofibrate and Crestor.  Over the past year, she has had issues with pneumonia she also has noticed some mild leg swelling. She does have proteinuria with her nephropathy and is on combination therapy with both ACE-I and ARB therapy by Dr. Jimmy Footman. She states she underwent carotid studies in June.  She denies recent chest pain. She denies presyncope or syncope. She notes her resting pulse typically runs somewhat in the 90s. She presents for evaluation.  Past Medical History  Diagnosis Date  . Renal disorder   . Diabetes mellitus   . Hypertension   . High cholesterol   . Thyroid disease     Past Surgical History  Procedure Laterality Date  . Cholecystectomy    . Cesarean section    . Rotator cuff repair    . Bunionectomy      Allergies  Allergen Reactions  . Codeine Nausea And Vomiting  . Ibuprofen   . Nsaids Other (See Comments)    Per Kidney DR. No NSAIDS  . Shellfish Allergy Other (See Comments)    Gout Flare-ups     Current Outpatient Prescriptions  Medication Sig Dispense Refill  . allopurinol (ZYLOPRIM) 100 MG tablet Take 200 mg by mouth daily.      Marland Kitchen aspirin EC 325 MG tablet Take 325 mg by mouth daily.      Marland Kitchen dexlansoprazole (DEXILANT) 60 MG capsule Take 60 mg by  mouth daily.      Marland Kitchen diltiazem (CARDIZEM CD) 240 MG 24 hr capsule Take 240 mg by mouth at bedtime.      . fenofibrate (TRICOR) 145 MG tablet Take 145 mg by mouth daily.      . furosemide (LASIX) 20 MG tablet Take 20 mg by mouth daily.      Marland Kitchen glimepiride (AMARYL) 2 MG tablet 1 tab bid      . levothyroxine (SYNTHROID, LEVOTHROID) 125 MCG tablet Take 125 mcg by mouth daily.      Marland Kitchen linagliptin (TRADJENTA) 5 MG TABS tablet Take 5 mg by mouth daily.      Marland Kitchen lisinopril (PRINIVIL,ZESTRIL) 40 MG tablet Take 40 mg by mouth 2 (two) times daily.      . Nutritional Supplements (ESTROVEN) TABS Take 1 tablet by mouth daily.      . Omega-3 Fatty Acids (FISH OIL) 1200 MG CAPS Take 2 capsules by mouth daily.       Marland Kitchen rOPINIRole (REQUIP) 0.5 MG tablet Take 0.5 mg by mouth daily.       . rosuvastatin (CRESTOR) 40 MG tablet Take 40 mg by mouth at bedtime.      Marland Kitchen telmisartan (MICARDIS) 80 MG tablet Take 160 mg by mouth daily.      . metoprolol succinate (TOPROL-XL) 50 MG 24  hr tablet Take 1 tablet (50 mg total) by mouth daily. Take with or immediately following a meal.  90 tablet  3   No current facility-administered medications for this visit.    History   Social History  . Marital Status: Married    Spouse Name: N/A    Number of Children: N/A  . Years of Education: N/A   Occupational History  . Not on file.   Social History Main Topics  . Smoking status: Never Smoker   . Smokeless tobacco: Not on file  . Alcohol Use: No  . Drug Use: No  . Sexual Activity: Not on file   Other Topics Concern  . Not on file   Social History Narrative  . No narrative on file     ROS is negative for fevers, chills or night sweats.  She denies presyncope or syncope. She denies wheezing. Does note an ache in her right shoulder. Pneumonia. She does note mild shortness of breath. There is no chest pain. Notes trace edema in her legs. She denies claudication. She denies paresthesias. Other system review is  negative.  PE BP 128/78  Pulse 90  Ht 5\' 3"  (1.6 m)  Wt 166 lb (75.297 kg)  BMI 29.41 kg/m2  General: Alert, oriented, no distress.  Skin: normal turgor, no rashes HEENT: Normocephalic, atraumatic. Pupils round and reactive; sclera anicteric;no lid lag.  Nose without nasal septal hypertrophy Mouth/Parynx benign; Mallinpatti scale 3 Neck: No JVD, no carotid briuts Lungs: clear to ausculatation and percussion; no wheezing or rales Heart: RRR, s1 s2 normal 1/6 sem Abdomen: soft, nontender; no hepatosplenomehaly, BS+; abdominal aorta nontender and not dilated by palpation. Pulses 2+ Extremities: Trace pretibial edema, no clubbing cyanosis, Homan's sign negative  Neurologic: grossly nonfocal  ECG: Normal sinus rhythm at 92 beats per minute. Poor R wave progression.  LABS:  BMET    Component Value Date/Time   NA 140 10/29/2012 0845   K 4.1 10/29/2012 0845   CL 103 10/29/2012 0845   CO2 25 10/29/2012 0845   GLUCOSE 195* 10/29/2012 0845   BUN 14 10/29/2012 0845   CREATININE 1.06 10/29/2012 0845   CALCIUM 10.4 10/29/2012 0845   GFRNONAA 56* 10/29/2012 0845   GFRAA 65* 10/29/2012 0845     Hepatic Function Panel     Component Value Date/Time   PROT 6.6 11/07/2011 0910   ALBUMIN 3.3* 11/07/2011 0910   AST 36 11/07/2011 0910   ALT 52* 11/07/2011 0910   ALKPHOS 43 11/07/2011 0910   BILITOT 0.3 11/07/2011 0910     CBC    Component Value Date/Time   WBC 8.4 10/29/2012 0845   RBC 4.33 10/29/2012 0845   HGB 13.3 10/29/2012 0845   HCT 38.3 10/29/2012 0845   PLT 335 10/29/2012 0845   MCV 88.5 10/29/2012 0845   MCH 30.7 10/29/2012 0845   MCHC 34.7 10/29/2012 0845   RDW 13.6 10/29/2012 0845   LYMPHSABS 1.6 10/29/2012 0845   MONOABS 0.6 10/29/2012 0845   EOSABS 0.4 10/29/2012 0845   BASOSABS 0.1 10/29/2012 0845     BNP No results found for this basename: probnp    Lipid Panel  No results found for this basename: chol, trig, hdl, cholhdl, vldl, ldlcalc     RADIOLOGY: No results found.    ASSESSMENT AND  PLAN: [name] is now 62 years old and has a history of hypertension, hypothyroidism, GERD and obstructive sleep apnea as well as her nephrotic range proteinuria due to membranous glomerulonephritis.  She only intermittently utilizes her CPAP therapy. We discussed importance of continued improved compliance and increased cardiovascular comorbidities  if her sleep apnea is left untreated. I'm recommending further titration of her Toprol to 50 mg daily from her present dose of 37.5 mg. She also is on Cardizem. She is on combination therapy for lipid treatment including fenofibrate and rosuvastatin. Blood sugar was recently elevated and diabetes needs to be more optimally controlled I will be available to see her if problems arise. Otherwise I will see her in one year for followup evaluation.     Troy Sine, MD, [facility]  04/08/2013 8:55 AM

## 2013-06-07 ENCOUNTER — Other Ambulatory Visit (HOSPITAL_COMMUNITY): Payer: Self-pay | Admitting: Nephrology

## 2013-06-07 DIAGNOSIS — N059 Unspecified nephritic syndrome with unspecified morphologic changes: Secondary | ICD-10-CM

## 2013-06-08 ENCOUNTER — Encounter: Payer: Self-pay | Admitting: Cardiovascular Disease

## 2013-06-14 ENCOUNTER — Encounter (HOSPITAL_COMMUNITY)
Admission: RE | Admit: 2013-06-14 | Discharge: 2013-06-14 | Disposition: A | Discharge status: Home / Self Care | Payer: 59 | Source: Ambulatory Visit | Attending: Nephrology | Admitting: Nephrology

## 2013-06-14 ENCOUNTER — Other Ambulatory Visit (HOSPITAL_COMMUNITY): Payer: Self-pay | Admitting: *Deleted

## 2013-06-14 DIAGNOSIS — Z01812 Encounter for preprocedural laboratory examination: Secondary | ICD-10-CM | POA: Insufficient documentation

## 2013-06-14 LAB — PLATELET FUNCTION ASSAY: Collagen / Epinephrine: 240 seconds (ref 0–197)

## 2013-06-14 LAB — PROTIME-INR: Prothrombin Time: 12.4 seconds (ref 11.6–15.2)

## 2013-06-14 LAB — APTT: aPTT: 32 seconds (ref 24–37)

## 2013-06-15 ENCOUNTER — Encounter (HOSPITAL_COMMUNITY): Payer: Self-pay | Admitting: Pharmacy Technician

## 2013-06-18 ENCOUNTER — Observation Stay (HOSPITAL_COMMUNITY)
Admission: RE | Admit: 2013-06-18 | Discharge: 2013-06-19 | Disposition: A | Discharge status: Home / Self Care | Payer: 59 | Source: Ambulatory Visit | Attending: Nephrology | Admitting: Nephrology

## 2013-06-18 ENCOUNTER — Encounter (HOSPITAL_COMMUNITY): Payer: Self-pay

## 2013-06-18 VITALS — BP 118/60 | HR 76 | Temp 98.2°F | Resp 18 | Ht 63.0 in | Wt 165.0 lb

## 2013-06-18 DIAGNOSIS — R809 Proteinuria, unspecified: Secondary | ICD-10-CM | POA: Insufficient documentation

## 2013-06-18 DIAGNOSIS — K219 Gastro-esophageal reflux disease without esophagitis: Secondary | ICD-10-CM | POA: Insufficient documentation

## 2013-06-18 DIAGNOSIS — E119 Type 2 diabetes mellitus without complications: Secondary | ICD-10-CM | POA: Insufficient documentation

## 2013-06-18 DIAGNOSIS — I129 Hypertensive chronic kidney disease with stage 1 through stage 4 chronic kidney disease, or unspecified chronic kidney disease: Principal | ICD-10-CM | POA: Insufficient documentation

## 2013-06-18 DIAGNOSIS — N269 Renal sclerosis, unspecified: Secondary | ICD-10-CM | POA: Insufficient documentation

## 2013-06-18 DIAGNOSIS — N059 Unspecified nephritic syndrome with unspecified morphologic changes: Secondary | ICD-10-CM

## 2013-06-18 DIAGNOSIS — E785 Hyperlipidemia, unspecified: Secondary | ICD-10-CM | POA: Insufficient documentation

## 2013-06-18 DIAGNOSIS — E669 Obesity, unspecified: Secondary | ICD-10-CM | POA: Insufficient documentation

## 2013-06-18 DIAGNOSIS — Z79899 Other long term (current) drug therapy: Secondary | ICD-10-CM | POA: Insufficient documentation

## 2013-06-18 DIAGNOSIS — E039 Hypothyroidism, unspecified: Secondary | ICD-10-CM | POA: Insufficient documentation

## 2013-06-18 HISTORY — DX: Cardiac murmur, unspecified: R01.1

## 2013-06-18 HISTORY — DX: Angina pectoris, unspecified: I20.9

## 2013-06-18 HISTORY — DX: Shortness of breath: R06.02

## 2013-06-18 HISTORY — DX: Cardiac arrhythmia, unspecified: I49.9

## 2013-06-18 HISTORY — DX: Sleep apnea, unspecified: G47.30

## 2013-06-18 HISTORY — DX: Unspecified osteoarthritis, unspecified site: M19.90

## 2013-06-18 HISTORY — DX: Unspecified nephritic syndrome with diffuse membranous glomerulonephritis: N05.2

## 2013-06-18 HISTORY — DX: Occlusion and stenosis of unspecified carotid artery: I65.29

## 2013-06-18 HISTORY — DX: Hypothyroidism, unspecified: E03.9

## 2013-06-18 HISTORY — DX: Gastro-esophageal reflux disease without esophagitis: K21.9

## 2013-06-18 HISTORY — DX: Anemia, unspecified: D64.9

## 2013-06-18 HISTORY — DX: Pneumonia, unspecified organism: J18.9

## 2013-06-18 HISTORY — DX: Type 2 diabetes mellitus without complications: E11.9

## 2013-06-18 HISTORY — DX: Gout, unspecified: M10.9

## 2013-06-18 HISTORY — DX: Inflammatory liver disease, unspecified: K75.9

## 2013-06-18 HISTORY — DX: Headache: R51

## 2013-06-18 LAB — TYPE AND SCREEN
ABO/RH(D): B POS
Antibody Screen: NEGATIVE

## 2013-06-18 LAB — CBC
HCT: 36.9 % (ref 36.0–46.0)
HCT: 34.1 % — ABNORMAL LOW (ref 36.0–46.0)
Hemoglobin: 11.6 g/dL — ABNORMAL LOW (ref 12.0–15.0)
MCH: 31.8 pg (ref 26.0–34.0)
MCH: 31.4 pg (ref 26.0–34.0)
MCHC: 34.4 g/dL (ref 30.0–36.0)
MCHC: 34 g/dL (ref 30.0–36.0)
MCV: 92.3 fL (ref 78.0–100.0)
Platelets: 306 10*3/uL (ref 150–400)
RDW: 13.5 % (ref 11.5–15.5)
WBC: 9.5 10*3/uL (ref 4.0–10.5)

## 2013-06-18 LAB — BASIC METABOLIC PANEL
BUN: 24 mg/dL — ABNORMAL HIGH (ref 6–23)
Calcium: 10.3 mg/dL (ref 8.4–10.5)
Creatinine, Ser: 1.08 mg/dL (ref 0.50–1.10)
GFR calc Af Amer: 63 mL/min — ABNORMAL LOW (ref >90)
GFR calc non Af Amer: 54 mL/min — ABNORMAL LOW (ref >90)

## 2013-06-18 LAB — GLUCOSE, CAPILLARY: Glucose-Capillary: 115 mg/dL — ABNORMAL HIGH (ref 70–99)

## 2013-06-18 MED ORDER — OXYCODONE-ACETAMINOPHEN 5-325 MG PO TABS: 1.0000 | ORAL_TABLET | ORAL | Status: DC | PRN

## 2013-06-18 MED ORDER — SODIUM CHLORIDE 0.9 % IV SOLN
INTRAVENOUS | Status: DC
  Administered 2013-06-18 (×2): via INTRAVENOUS

## 2013-06-18 MED ORDER — NON FORMULARY: 160.0000 mg | Freq: Every day | Status: DC

## 2013-06-18 MED ORDER — TELMISARTAN 80 MG PO TABS
160.0000 mg | ORAL_TABLET | Freq: Every day | ORAL | Status: DC
  Administered 2013-06-18 – 2013-06-19 (×2): 160 mg via ORAL
  Filled 2013-06-18 (×2): qty 2

## 2013-06-18 MED ORDER — HYDROCODONE-ACETAMINOPHEN 5-325 MG PO TABS: 1.0000 | ORAL_TABLET | Freq: Four times a day (QID) | ORAL | Status: DC | PRN

## 2013-06-18 MED ORDER — SODIUM CHLORIDE 0.9 % IV SOLN: INTRAVENOUS | Status: DC

## 2013-06-18 MED ORDER — FENOFIBRATE 160 MG PO TABS
160.0000 mg | ORAL_TABLET | Freq: Every day | ORAL | Status: DC
  Filled 2013-06-18: qty 1

## 2013-06-18 MED ORDER — LORAZEPAM 1 MG PO TABS
4.0000 mg | ORAL_TABLET | ORAL | Status: AC
  Administered 2013-06-18: 4 mg via ORAL
  Filled 2013-06-18: qty 4

## 2013-06-18 MED ORDER — LEVOTHYROXINE SODIUM 100 MCG PO TABS: 100.0000 ug | ORAL_TABLET | Freq: Every day | ORAL | Status: DC

## 2013-06-18 MED ORDER — LORAZEPAM 1 MG PO TABS
ORAL_TABLET | ORAL | Status: AC
  Filled 2013-06-18: qty 4

## 2013-06-18 MED ORDER — ALLOPURINOL 100 MG PO TABS
200.0000 mg | ORAL_TABLET | Freq: Every day | ORAL | Status: DC
  Administered 2013-06-18 – 2013-06-19 (×2): 200 mg via ORAL
  Filled 2013-06-18 (×2): qty 2

## 2013-06-18 MED ORDER — LINAGLIPTIN 5 MG PO TABS
5.0000 mg | ORAL_TABLET | Freq: Every day | ORAL | Status: DC
  Filled 2013-06-18 (×2): qty 1

## 2013-06-18 MED ORDER — FENOFIBRATE 160 MG PO TABS
160.0000 mg | ORAL_TABLET | ORAL | Status: DC
  Administered 2013-06-18: 160 mg via ORAL
  Filled 2013-06-18 (×3): qty 1

## 2013-06-18 MED ORDER — PANTOPRAZOLE SODIUM 40 MG PO TBEC
40.0000 mg | DELAYED_RELEASE_TABLET | Freq: Every day | ORAL | Status: DC
  Administered 2013-06-18 – 2013-06-19 (×2): 40 mg via ORAL
  Filled 2013-06-18 (×2): qty 1

## 2013-06-18 MED ORDER — ATORVASTATIN CALCIUM 80 MG PO TABS
80.0000 mg | ORAL_TABLET | Freq: Every day | ORAL | Status: DC
  Administered 2013-06-18: 80 mg via ORAL
  Filled 2013-06-18 (×2): qty 1

## 2013-06-18 MED ORDER — ROPINIROLE HCL 0.5 MG PO TABS
0.5000 mg | ORAL_TABLET | Freq: Every day | ORAL | Status: DC
  Administered 2013-06-18: 0.5 mg via ORAL
  Filled 2013-06-18 (×4): qty 1

## 2013-06-18 MED ORDER — METOPROLOL SUCCINATE ER 50 MG PO TB24
50.0000 mg | ORAL_TABLET | Freq: Every day | ORAL | Status: DC
  Administered 2013-06-18 – 2013-06-19 (×2): 50 mg via ORAL
  Filled 2013-06-18 (×2): qty 1

## 2013-06-18 MED ORDER — LEVOTHYROXINE SODIUM 125 MCG PO TABS
125.0000 ug | ORAL_TABLET | Freq: Every day | ORAL | Status: DC
  Administered 2013-06-19: 125 ug via ORAL
  Filled 2013-06-18 (×2): qty 1

## 2013-06-18 MED ORDER — DILTIAZEM HCL ER COATED BEADS 240 MG PO CP24
240.0000 mg | ORAL_CAPSULE | Freq: Every day | ORAL | Status: DC
  Filled 2013-06-18: qty 1

## 2013-06-18 MED ORDER — LISINOPRIL 40 MG PO TABS
40.0000 mg | ORAL_TABLET | Freq: Two times a day (BID) | ORAL | Status: DC
  Administered 2013-06-18 – 2013-06-19 (×2): 40 mg via ORAL
  Filled 2013-06-18 (×4): qty 1

## 2013-06-18 MED ORDER — LEVOTHYROXINE SODIUM 25 MCG PO TABS: 25.0000 ug | ORAL_TABLET | Freq: Every day | ORAL | Status: DC

## 2013-06-18 MED ORDER — ACETAMINOPHEN 500 MG PO TABS: 500.0000 mg | ORAL_TABLET | Freq: Four times a day (QID) | ORAL | Status: DC | PRN

## 2013-06-18 MED ORDER — DILTIAZEM HCL ER COATED BEADS 240 MG PO CP24
240.0000 mg | ORAL_CAPSULE | ORAL | Status: DC
Start: 2013-06-18 — End: 2013-06-19
  Administered 2013-06-18: 240 mg via ORAL
  Filled 2013-06-18 (×3): qty 1

## 2013-06-18 NOTE — H&P (Signed)
[name] is an 62 y.o. female.  HPI: 53yr female with hx MGN bx 17 yr ago.  Quick response to steroids at that time.  Over past 1 yr worsening proteinuria.  Interim devel of DM.  Has signif HTN, ^ Lipids, hypothyroid,  And obesity.  Admitted for renal bx.   Past Medical History  Diagnosis Date  . Renal disorder   . Diabetes mellitus   . Hypertension   . High cholesterol   . Thyroid disease     Allergies:  Allergies  Allergen Reactions  . Codeine Nausea And Vomiting  . Ibuprofen Other (See Comments)    Per kidney MD.  . Nsaids Other (See Comments)    Per Kidney DR. No NSAIDS  . Shellfish Allergy Other (See Comments)    REACTION: Gout Flare-ups     Medications: {medication reviewed/display:[id number] Results for orders placed during the hospital encounter of 06/18/13 (from the past 48 hour(s))  TYPE AND SCREEN     Status: None   Collection Time    06/18/13 10:15 AM      Result Value Range   ABO/RH(D) B POS     Antibody Screen NEG     Sample Expiration 06/21/2013    ABO/RH     Status: None   Collection Time    06/18/13 10:15 AM      Result Value Range   ABO/RH(D) B POS    CBC     Status: None   Collection Time    06/18/13 10:26 AM      Result Value Range   WBC 9.5  4.0 - 10.5 K/uL   RBC 4.00  3.87 - 5.11 MIL/uL   Hemoglobin 12.7  12.0 - 15.0 g/dL   HCT 36.9  36.0 - 46.0 %   MCV 92.3  78.0 - 100.0 fL   MCH 31.8  26.0 - 34.0 pg   MCHC 34.4  30.0 - 36.0 g/dL   RDW 13.5  11.5 - 15.5 %   Platelets 306  150 - 400 K/uL  BASIC METABOLIC PANEL     Status: Abnormal   Collection Time    06/18/13 10:26 AM      Result Value Range   Sodium 139  135 - 145 mEq/L   Potassium 4.2  3.5 - 5.1 mEq/L   Chloride 104  96 - 112 mEq/L   CO2 25  19 - 32 mEq/L   Glucose, Bld 125 (*) 70 - 99 mg/dL   BUN 24 (*) 6 - 23 mg/dL   Creatinine, Ser 1.08  0.50 - 1.10 mg/dL   Calcium 10.3  8.4 - 10.5 mg/dL   GFR calc non Af Amer 54 (*) >90 mL/min   GFR calc Af Amer 63 (*) >90 mL/min   Comment: (NOTE)     The eGFR has been calculated using the CKD EPI equation.     This calculation has not been validated in all clinical situations.     eGFR's persistently <90 mL/min signify possible Chronic Kidney     Disease.  GLUCOSE, CAPILLARY     Status: Abnormal   Collection Time    06/18/13 10:30 AM      Result Value Range   Glucose-Capillary 115 (*) 70 - 99 mg/dL    No results found.  Blood pressure 127/54, pulse 72, temperature 97.8 F (36.6 C), temperature source Oral, resp. rate 20, height 5\' 3"  (1.6 m), weight 74.844 kg (165 lb), SpO2 100.00%. General appearance: alert, cooperative, mildly obese  and pale Head: Normocephalic, without obvious abnormality, atraumatic Eyes: fundi benign Neck: PCL Resp: clear to auscultation bilaterally Chest wall: kyphosis Cardio: S1, S2 normal, S4 present and systolic murmur: holosystolic 2/6, blowing at apex GI: obese,pos bs, liver down 5 cm Extremities: edema 2+ Skin: pale doughy Lymph nodes: Cervical adenopathy: PCL  Assessment/Plan: 1 Membranous in past , worsening proteinuria. ?MGN recurrence vs DM vs other 2 DM 3 HTN 4 Obesity 5 ^ Lipids P Renal bx, observe over night, meds.   [name] L 06/18/2013, 12:58 PM

## 2013-06-18 NOTE — Progress Notes (Signed)
06/18/2013 patient came from ultrasound to [facility]. Patient is alert, oriented and ambulatory. She had a ultrasound guided biopsy bandaid on the left lower back have little blood stain. Patient voided, but no blood in urine. Patient skin is fine.[facility] RN.

## 2013-06-18 NOTE — Procedures (Signed)
Patient in prone position.  Kidneys localized with U/S.  Prep clorohexadine, xylocaine LA.  Using U/S guidance 2 passes to obtain 2 cores of tissue.  EBL 5cc.  Tolerated well.Marland Kitchen

## 2013-06-19 LAB — CBC
Hemoglobin: 11.6 g/dL — ABNORMAL LOW (ref 12.0–15.0)
MCH: 31.7 pg (ref 26.0–34.0)
MCV: 92.6 fL (ref 78.0–100.0)
RBC: 3.66 MIL/uL — ABNORMAL LOW (ref 3.87–5.11)
RDW: 13.6 % (ref 11.5–15.5)
WBC: 10.6 10*3/uL — ABNORMAL HIGH (ref 4.0–10.5)

## 2013-06-19 LAB — GLUCOSE, CAPILLARY
Glucose-Capillary: 149 mg/dL — ABNORMAL HIGH (ref 70–99)
Glucose-Capillary: 150 mg/dL — ABNORMAL HIGH (ref 70–99)
Glucose-Capillary: 132 mg/dL — ABNORMAL HIGH (ref 70–99)

## 2013-06-19 NOTE — Discharge Summary (Signed)
  Physician Discharge Summary  Patient ID: [name] MRN: [id number] DOB/AGE: [birth year] 62 y.o.  Admit date: 06/18/2013 Discharge date: 06/19/2013  Admission Diagnoses:Proteinuria  Discharge Diagnoses:Proteinuria  DM HTN Obesity Hyperlipidemia Hypothyroid GERD   Active Problems:   * No active hospital problems. *   Discharged Condition: good  Hospital Course: Patient admitted for Renal Biopsy.  Procedure went well. Hb 11.6, little change.  F/U in office scheduled.    Treatments: procedures: Renal Biopsy  Blood pressure 112/57, pulse 76, temperature 98.2 F (36.8 C), temperature source Oral, resp. rate 18, height 5\' 3"  (1.6 m), weight 74.844 kg (165 lb), SpO2 98.00%.  Disposition: 01-Home or Self Care Resume home meds.  F/U as scheduled.     Medication List         allopurinol 100 MG tablet  Commonly known as:  ZYLOPRIM  Take 200 mg by mouth daily.     aspirin EC 325 MG tablet  Take 325 mg by mouth daily.     cetirizine 10 MG tablet  Commonly known as:  ZYRTEC  Take 10 mg by mouth at bedtime.     DEXILANT 60 MG capsule  Generic drug:  dexlansoprazole  Take 60 mg by mouth every morning.     diltiazem 240 MG 24 hr capsule  Commonly known as:  CARDIZEM CD  Take 240 mg by mouth at bedtime.     ESTROVEN Tabs  Take 1 tablet by mouth at bedtime.     fenofibrate 145 MG tablet  Commonly known as:  TRICOR  Take 145 mg by mouth at bedtime.     Fish Oil 1200 MG Caps  Take 2 capsules by mouth every morning.     furosemide 40 MG tablet  Commonly known as:  LASIX  Take 40 mg by mouth daily.     glimepiride 2 MG tablet  Commonly known as:  AMARYL  Take 2 mg by mouth 2 (two) times daily.     levothyroxine 125 MCG tablet  Commonly known as:  SYNTHROID, LEVOTHROID  Take 125 mcg by mouth daily.     linagliptin 5 MG Tabs tablet  Commonly known as:  TRADJENTA  Take 5 mg by mouth every morning.     lisinopril 40 MG tablet  Commonly known as:   PRINIVIL,ZESTRIL  Take 40 mg by mouth 2 (two) times daily.     metoprolol succinate 50 MG 24 hr tablet  Commonly known as:  TOPROL-XL  Take 1 tablet (50 mg total) by mouth daily. Take with or immediately following a meal.     multivitamin with minerals Tabs tablet  Take 1 tablet by mouth daily.     rOPINIRole 0.5 MG tablet  Commonly known as:  REQUIP  Take 0.5 mg by mouth at bedtime.     rosuvastatin 40 MG tablet  Commonly known as:  CRESTOR  Take 40 mg by mouth at bedtime.     telmisartan 80 MG tablet  Commonly known as:  MICARDIS  Take 160 mg by mouth at bedtime.           Follow-up Information   Please follow up. (scheduled already)       Signed: Tahjay Binion L 06/19/2013, 7:41 AM

## 2013-06-19 NOTE — Progress Notes (Signed)
Subjective: Interval History: none.  Objective: Vital signs in last 24 hours: Temp:  [97.4 F (36.3 C)-98.6 F (37 C)] 98.2 F (36.8 C) (10/28 0536) Pulse Rate:  [72-89] 76 (10/28 0536) Resp:  [17-20] 18 (10/28 0536) BP: (104-128)/(48-61) 112/57 mmHg (10/28 0536) SpO2:  [96 %-100 %] 98 % (10/28 0536) Weight:  [74.844 kg (165 lb)] 74.844 kg (165 lb) (10/27 1027) Weight change:   Intake/Output from previous day: 10/27 0701 - 10/28 0700 In: 220 [P.O.:220] Out: 200 [Urine:200] Intake/Output this shift:    General appearance: alert, cooperative and mildly obese Resp: clear to auscultation bilaterally Chest wall: no tenderness GI: soft, non-tender; bowel sounds normal; no masses,  no organomegaly and liver down 4 cm Skin: Skin color, texture, turgor normal. No rashes or lesions  Lab Results:  Recent Labs  06/18/13 1647 06/19/13 0358  WBC 9.4 10.6*  HGB 11.6* 11.6*  HCT 34.1* 33.9*  PLT 293 279   BMET:  Recent Labs  06/18/13 1026  NA 139  K 4.2  CL 104  CO2 25  GLUCOSE 125*  BUN 24*  CREATININE 1.08  CALCIUM 10.3   No results found for this basename: PTH,  in the last 72 hours Iron Studies: No results found for this basename: IRON, TIBC, TRANSFERRIN, FERRITIN,  in the last 72 hours  Studies/Results: No results found.  I have reviewed the patient's current medications.  Assessment/Plan: 1 Proteinuria BX pending 2 DM controlled monitor at home 3 HTN  4 Hyperlipidemia 5Hypothyroid P F/u in office    LOS: 1 day   Zachry Hopfensperger L 06/19/2013,7:45 AM

## 2013-07-02 ENCOUNTER — Encounter (HOSPITAL_COMMUNITY): Payer: Self-pay

## 2013-09-03 ENCOUNTER — Other Ambulatory Visit (HOSPITAL_COMMUNITY): Payer: Self-pay | Admitting: Obstetrics and Gynecology

## 2013-09-03 DIAGNOSIS — Z139 Encounter for screening, unspecified: Secondary | ICD-10-CM

## 2013-10-01 ENCOUNTER — Ambulatory Visit (HOSPITAL_COMMUNITY)
Admission: RE | Admit: 2013-10-01 | Discharge: 2013-10-01 | Disposition: A | Discharge status: Home / Self Care | Payer: 59 | Source: Ambulatory Visit | Attending: Obstetrics and Gynecology | Admitting: Obstetrics and Gynecology

## 2013-10-01 DIAGNOSIS — Z139 Encounter for screening, unspecified: Secondary | ICD-10-CM

## 2013-10-01 DIAGNOSIS — Z1231 Encounter for screening mammogram for malignant neoplasm of breast: Secondary | ICD-10-CM | POA: Insufficient documentation

## 2013-10-10 ENCOUNTER — Encounter: Payer: Self-pay | Admitting: Family Medicine

## 2014-03-25 ENCOUNTER — Encounter: Payer: Self-pay | Admitting: Cardiovascular Disease

## 2014-03-25 ENCOUNTER — Ambulatory Visit (INDEPENDENT_AMBULATORY_CARE_PROVIDER_SITE_OTHER): Payer: 59 | Admitting: Cardiovascular Disease

## 2014-03-25 VITALS — BP 112/70 | HR 70 | Ht 62.5 in | Wt 163.5 lb

## 2014-03-25 DIAGNOSIS — N052 Unspecified nephritic syndrome with diffuse membranous glomerulonephritis: Secondary | ICD-10-CM

## 2014-03-25 DIAGNOSIS — I150 Renovascular hypertension: Secondary | ICD-10-CM

## 2014-03-25 DIAGNOSIS — I151 Hypertension secondary to other renal disorders: Secondary | ICD-10-CM

## 2014-03-25 DIAGNOSIS — E785 Hyperlipidemia, unspecified: Secondary | ICD-10-CM

## 2014-03-25 DIAGNOSIS — E1121 Type 2 diabetes mellitus with diabetic nephropathy: Secondary | ICD-10-CM

## 2014-03-25 DIAGNOSIS — G4733 Obstructive sleep apnea (adult) (pediatric): Secondary | ICD-10-CM

## 2014-03-25 DIAGNOSIS — E1129 Type 2 diabetes mellitus with other diabetic kidney complication: Secondary | ICD-10-CM

## 2014-03-25 DIAGNOSIS — N058 Unspecified nephritic syndrome with other morphologic changes: Secondary | ICD-10-CM

## 2014-03-25 DIAGNOSIS — K219 Gastro-esophageal reflux disease without esophagitis: Secondary | ICD-10-CM

## 2014-03-25 DIAGNOSIS — N2889 Other specified disorders of kidney and ureter: Secondary | ICD-10-CM

## 2014-03-25 NOTE — Patient Instructions (Signed)
Your physician recommends that you schedule a follow-up appointment in: 1 year. No changes were made today in your therapy. 

## 2014-03-25 NOTE — Progress Notes (Signed)
Patient ID: [name], female   DOB: 1951-08-11, 63 y.o.   MRN: [id number]      HPI: [name] is a 63 y.o. female who presents for one-year cardiology evaluation.  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 Jimmy Footman. She also has a history of hypertension, hypothyroidism,  DM2, GERD as well as obstructive sleep apnea and has only  intermittently utilized CPAP therapy. Her OSA is moderate with an AHI of 20.9 but severe during REM sleep with an AHI of 54.3 which was noted on her initial sleep study in 2009.  She also has a history of hyperlipidemia and has been on combination therapy with fenofibrate and Crestor.  Over the past year, she states that she underwent another kidney biopsy in October.  This did not show any significant pathologic change.  She continues to be on high-dose ARB and ACE inhibition to reduce protein excretion and also takes furosemide.  In addition to diltiazem for blood pressure.  She has not been using her CPAP recently.  She has TMJ and has been using it might mouthguard.  Oftentimes this as her mouth stay open.  While sleeping.  She has had recent issues with plantar fasciitis.  She denies chest pain.  She denies PND or orthopnea.  She denies presyncope or syncope.  She is unaware of any arrhythmia.  Past Medical History  Diagnosis Date  . Hypertension   . High cholesterol   . Heart murmur   . Type II diabetes mellitus   . Anginal pain     "often; usually when thyroid goes out of wack and heart starts fluttering" (06/18/2013)  . Dysrhythmia     "palpitation-type feelings; related to thyroid" (06/18/2013)  . Carotid stenosis ~ 01/2013    "bilaterally; ~ 50%; dx'd/carotid US" (06/18/2013)  . Hypothyroidism   . Pneumonia ?1980's; 09/2011    "twice" (06/18/2013)  . Exertional shortness of breath   . Sleep apnea     "have mask; haven't worn it in ~  1 yr" (06/18/2013)  . Anemia     "as a kid" (06/18/2013)  . GERD (gastroesophageal reflux disease)   . [id number])     "monthly" (06/18/2013)  . Arthritis     "joints probably" (101/27/2014)  . Gout   . Membranous glomerulonephritis   . Hepatitis, unspecified 1972    "husband had hepatitis; my mother, daughter, and me all had to get the gammaglobulin shots" (06/18/2013)    Past Surgical History  Procedure Laterality Date  . Cesarean section  1971; 1977  . Shoulder arthroscopy w/ rotator cuff repair Right 2009  . Bunionectomy Bilateral [id number]    "once on each side" (06/18/2013)  . Cholecystectomy  ?1998  . Renal biopsy Left ~ 1997; 06/18/2013    Allergies  Allergen Reactions  . Codeine Nausea And Vomiting  . Ibuprofen Other (See Comments)    Per kidney MD.  . Nsaids Other (See Comments)    Per Kidney DR. No NSAIDS  . Shellfish Allergy Other (See Comments)    REACTION: Gout Flare-ups     Current Outpatient Prescriptions  Medication Sig Dispense Refill  . allopurinol (ZYLOPRIM) 300 MG tablet Take 300 mg by mouth daily.      Marland Kitchen aspirin EC 325 MG tablet Take 325 mg by mouth daily.      . cetirizine (ZYRTEC) 10 MG tablet Take 10 mg by mouth at bedtime.      Marland Kitchen  dexlansoprazole (DEXILANT) 60 MG capsule Take 60 mg by mouth every morning.       . diltiazem (CARDIZEM CD) 240 MG 24 hr capsule Take 240 mg by mouth at bedtime.      . fenofibrate (TRICOR) 145 MG tablet Take 145 mg by mouth at bedtime.       . furosemide (LASIX) 40 MG tablet Take 40 mg by mouth daily.      Marland Kitchen glimepiride (AMARYL) 2 MG tablet Take 2 mg by mouth 2 (two) times daily.      Marland Kitchen levothyroxine (SYNTHROID, LEVOTHROID) 75 MCG tablet Take 75 mcg by mouth daily before breakfast.      . linagliptin (TRADJENTA) 5 MG TABS tablet Take 5 mg by mouth every morning.       Marland Kitchen lisinopril (PRINIVIL,ZESTRIL) 40 MG tablet Take 40 mg by mouth 2 (two) times daily.      . metoprolol succinate (TOPROL-XL) 50 MG 24 hr tablet  Take 1 tablet (50 mg total) by mouth daily. Take with or immediately following a meal.  90 tablet  3  . Multiple Vitamin (MULTIVITAMIN WITH MINERALS) TABS tablet Take 1 tablet by mouth daily.      . Nutritional Supplements (ESTROVEN) TABS Take 1 tablet by mouth at bedtime.       . Omega-3 Fatty Acids (FISH OIL) 1200 MG CAPS Take 2 capsules by mouth every morning.       Marland Kitchen rOPINIRole (REQUIP) 0.5 MG tablet Take 0.5 mg by mouth at bedtime.      . rosuvastatin (CRESTOR) 40 MG tablet Take 40 mg by mouth at bedtime.      Marland Kitchen telmisartan (MICARDIS) 80 MG tablet Take 160 mg by mouth at bedtime.        No current facility-administered medications for this visit.    History   Social History  . Marital Status: Married    Spouse Name: N/A    Number of Children: N/A  . Years of Education: N/A   Occupational History  . Not on file.   Social History Main Topics  . Smoking status: Never Smoker   . Smokeless tobacco: Never Used  . Alcohol Use: No  . Drug Use: No  . Sexual Activity: Not Currently   Other Topics Concern  . Not on file   Social History Narrative  . No narrative on file   Socially, she has just retired.  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  ROS General: Negative; No fevers, chills, or night sweats;  HEENT: Negative; No changes in vision or hearing, sinus congestion, difficulty swallowing Pulmonary: Negative; No cough, wheezing, shortness of breath, hemoptysis Cardiovascular: Negative; No chest pain, presyncope, syncope, palpitations Positive for occasional edema GI: Negative; No nausea, vomiting, diarrhea, or abdominal pain GU: Negative; No dysuria, hematuria, or difficulty voiding Renal: Positive for membranous glomerulonephropathy. Musculoskeletal: Positive for TMJ; no myalgias, joint pain, or weakness Hematologic/Oncology: Negative; no easy bruising, bleeding Endocrine: Negative; no heat/cold intolerance; no diabetes Neuro:  Negative; no changes in balance, headaches Skin: Negative; No rashes or skin lesions Psychiatric: Negative; No behavioral problems, depression Sleep: Positive for OSA; No snoring, daytime sleepiness, hypersomnolence, bruxism, restless legs, hypnogognic hallucinations, no cataplexy Other comprehensive 14 point system review is negative.  PE BP 112/70  Pulse 70  Ht 5' 2.5" (1.588 m)  Wt 163 lb 8 oz (74.163 kg)  BMI 29.41 kg/m2  General: Alert, oriented, no distress.  Skin: normal turgor, no rashes HEENT:  Normocephalic, atraumatic. Pupils round and reactive; sclera anicteric;no lid lag.  Nose without nasal septal hypertrophy Mouth/Parynx benign; Mallinpatti scale 3 Neck: No JVD, no carotid bruits with normal carotid Lungs: clear to ausculatation and percussion; no wheezing or rales Chest wall: Nontender to palpation Heart: RRR, s1 s2 normal 1/6 sem; no diastolic murmur, rubs, thrills or heaves. Abdomen: soft, nontender; no hepatosplenomehaly, BS+; abdominal aorta nontender and not dilated by palpation. Back: No CVA tenderness Pulses 2+ Extremities: Trace pretibial edema, no clubbing cyanosis, Homan's sign negative  Neurologic: grossly nonfocal Psychological: Normal affect and mood  ECG (independently read by me): Normal sinus rhythm at 70 beats per minute.  QTc interval 423 Katherine.  PR interval normal at 150 Katherine.  Prior ECG: Normal sinus rhythm at 92 beats per minute. Poor R wave progression.  LABS:  BMET    Component Value Date/Time   NA 139 06/18/2013 1026   K 4.2 06/18/2013 1026   CL 104 06/18/2013 1026   CO2 25 06/18/2013 1026   GLUCOSE 125* 06/18/2013 1026   BUN 24* 06/18/2013 1026   CREATININE 1.08 06/18/2013 1026   CALCIUM 10.3 06/18/2013 1026   GFRNONAA 54* 06/18/2013 1026   GFRAA 63* 06/18/2013 1026     Hepatic Function Panel     Component Value Date/Time   PROT 6.6 11/07/2011 0910   ALBUMIN 3.3* 11/07/2011 0910   AST 36 11/07/2011 0910   ALT 52* 11/07/2011 0910     ALKPHOS 43 11/07/2011 0910   BILITOT 0.3 11/07/2011 0910     CBC    Component Value Date/Time   WBC 10.6* 06/19/2013 0358   RBC 3.66* 06/19/2013 0358   HGB 11.6* 06/19/2013 0358   HCT 33.9* 06/19/2013 0358   PLT 279 06/19/2013 0358   MCV 92.6 06/19/2013 0358   MCH 31.7 06/19/2013 0358   MCHC 34.2 06/19/2013 0358   RDW 13.6 06/19/2013 0358   LYMPHSABS 1.6 10/29/2012 0845   MONOABS 0.6 10/29/2012 0845   EOSABS 0.4 10/29/2012 0845   BASOSABS 0.1 10/29/2012 0845     BNP No results found for this basename: probnp    Lipid Panel  No results found for this basename: chol,  trig,  hdl,  cholhdl,  vldl,  ldlcalc     RADIOLOGY: No results found.    ASSESSMENT AND PLAN: [name] will be turning 63 years old next week years and has a history of hypertension, hypothyroidism, GERD, obstructive sleep apnea as well as her nephrotic range proteinuria due to membranous glomerulonephritis.  When I last saw her, her resting pulse was elevated as was her blood pressure.  She now has been on an increased dose of beta blocker therapy with Toprol-XL at 50 mg daily.  Her resting pulse is now 70.  Blood pressure is controlled.  She also is on lisinopril for 80 mg in addition to my card is 160 mg, furosemide 40 mg as well as Cardizem CD 240 mg daily.  She's not having any chest pain.  She is on Crestor 40 mg for hyperlipidemia.  Therapist.  Her diabetes.  She is on Amaryl 2 mg, Cogentin 5 mg.  Her GERD is controlled with excellent.  She did recently have plantar fasciitis.  She's not having any shortness of breath.  Presently, her volume status appears normal.  I did review recent laboratory which had over the year.  SPEP and UPEP were normal without an M spike.  Her 24 urine in October 2014 showed over 4 g of  protein.  Apparently, there was no significant change in her renal pathology on biopsy.  Her EKG is stable today.  She will be having repeat blood work done by Dr. Jimmy Footman  As long as she remains stable  from a cardiac standpoint, I will see her one year followup evaluation.   Troy Sine, MD, [facility]  03/25/2014 6:14 PM

## 2014-04-21 ENCOUNTER — Other Ambulatory Visit: Payer: Self-pay | Admitting: Cardiovascular Disease

## 2014-04-22 NOTE — Telephone Encounter (Signed)
Rx was sent to pharmacy electronically. 

## 2014-05-22 ENCOUNTER — Other Ambulatory Visit (HOSPITAL_COMMUNITY): Payer: Self-pay | Admitting: Nephrology

## 2014-05-22 DIAGNOSIS — R0602 Shortness of breath: Secondary | ICD-10-CM

## 2014-05-24 ENCOUNTER — Ambulatory Visit (HOSPITAL_COMMUNITY)
Admission: RE | Admit: 2014-05-24 | Discharge: 2014-05-24 | Disposition: A | Discharge status: Home / Self Care | Payer: 59 | Source: Ambulatory Visit | Attending: Nephrology | Admitting: Nephrology

## 2014-05-24 DIAGNOSIS — Z9181 History of falling: Secondary | ICD-10-CM | POA: Insufficient documentation

## 2014-05-24 DIAGNOSIS — J9811 Atelectasis: Secondary | ICD-10-CM | POA: Insufficient documentation

## 2014-05-24 DIAGNOSIS — R0602 Shortness of breath: Secondary | ICD-10-CM

## 2014-05-24 DIAGNOSIS — J986 Disorders of diaphragm: Secondary | ICD-10-CM | POA: Diagnosis not present

## 2014-05-24 MED ORDER — TECHNETIUM TC 99M DIETHYLENETRIAME-PENTAACETIC ACID: 40.0000 | Freq: Once | INTRAVENOUS | Status: AC | PRN

## 2014-05-24 MED ORDER — TECHNETIUM TO 99M ALBUMIN AGGREGATED
6.0000 | Freq: Once | INTRAVENOUS | Status: AC | PRN
  Administered 2014-05-24: 6 via INTRAVENOUS

## 2014-08-19 ENCOUNTER — Other Ambulatory Visit (HOSPITAL_COMMUNITY): Payer: Self-pay | Admitting: Family Medicine

## 2014-08-19 DIAGNOSIS — R059 Cough, unspecified: Secondary | ICD-10-CM

## 2014-08-19 DIAGNOSIS — R918 Other nonspecific abnormal finding of lung field: Secondary | ICD-10-CM

## 2014-08-19 DIAGNOSIS — R05 Cough: Secondary | ICD-10-CM

## 2014-08-21 ENCOUNTER — Ambulatory Visit (HOSPITAL_COMMUNITY)
Admission: RE | Admit: 2014-08-21 | Discharge: 2014-08-21 | Disposition: A | Discharge status: Home / Self Care | Payer: 59 | Source: Ambulatory Visit | Attending: Family Medicine | Admitting: Family Medicine

## 2014-08-21 DIAGNOSIS — R059 Cough, unspecified: Secondary | ICD-10-CM

## 2014-08-21 DIAGNOSIS — J8410 Pulmonary fibrosis, unspecified: Secondary | ICD-10-CM | POA: Insufficient documentation

## 2014-08-21 DIAGNOSIS — R05 Cough: Secondary | ICD-10-CM | POA: Diagnosis present

## 2014-08-21 DIAGNOSIS — R918 Other nonspecific abnormal finding of lung field: Secondary | ICD-10-CM | POA: Insufficient documentation

## 2014-08-21 DIAGNOSIS — I898 Other specified noninfective disorders of lymphatic vessels and lymph nodes: Secondary | ICD-10-CM | POA: Diagnosis not present

## 2014-08-21 DIAGNOSIS — I2510 Atherosclerotic heart disease of native coronary artery without angina pectoris: Secondary | ICD-10-CM | POA: Diagnosis not present

## 2014-08-21 DIAGNOSIS — K760 Fatty (change of) liver, not elsewhere classified: Secondary | ICD-10-CM | POA: Insufficient documentation

## 2014-08-21 DIAGNOSIS — Z9049 Acquired absence of other specified parts of digestive tract: Secondary | ICD-10-CM | POA: Insufficient documentation

## 2014-08-21 LAB — POCT I-STAT CREATININE: CREATININE: 1.3 mg/dL — AB (ref 0.50–1.10)

## 2014-08-21 MED ORDER — IOHEXOL 300 MG/ML  SOLN
80.0000 mL | Freq: Once | INTRAMUSCULAR | Status: AC | PRN
  Administered 2014-08-21: 64 mL via INTRAVENOUS

## 2014-09-12 ENCOUNTER — Other Ambulatory Visit (HOSPITAL_COMMUNITY): Payer: Self-pay | Admitting: Family Medicine

## 2014-09-12 DIAGNOSIS — Z1231 Encounter for screening mammogram for malignant neoplasm of breast: Secondary | ICD-10-CM

## 2014-10-03 ENCOUNTER — Ambulatory Visit (HOSPITAL_COMMUNITY)
Admission: RE | Admit: 2014-10-03 | Discharge: 2014-10-03 | Disposition: A | Discharge status: Home / Self Care | Payer: 59 | Source: Ambulatory Visit | Attending: Family Medicine | Admitting: Family Medicine

## 2014-10-03 DIAGNOSIS — Z1231 Encounter for screening mammogram for malignant neoplasm of breast: Secondary | ICD-10-CM | POA: Insufficient documentation

## 2014-10-21 ENCOUNTER — Ambulatory Visit (HOSPITAL_COMMUNITY)
Admission: RE | Admit: 2014-10-21 | Discharge: 2014-10-21 | Disposition: A | Discharge status: Home / Self Care | Payer: 59 | Source: Ambulatory Visit | Attending: Family Medicine | Admitting: Family Medicine

## 2014-10-21 ENCOUNTER — Other Ambulatory Visit (HOSPITAL_COMMUNITY): Payer: Self-pay | Admitting: Family Medicine

## 2014-10-21 DIAGNOSIS — M545 Low back pain: Secondary | ICD-10-CM | POA: Diagnosis present

## 2015-01-15 ENCOUNTER — Encounter: Payer: Self-pay | Admitting: Cardiovascular Disease

## 2015-02-13 ENCOUNTER — Ambulatory Visit (HOSPITAL_COMMUNITY): Payer: 59 | Attending: Orthopedic Surgery

## 2015-02-13 ENCOUNTER — Encounter (HOSPITAL_COMMUNITY): Payer: Self-pay

## 2015-02-13 DIAGNOSIS — M25562 Pain in left knee: Secondary | ICD-10-CM | POA: Diagnosis present

## 2015-02-13 DIAGNOSIS — R2681 Unsteadiness on feet: Secondary | ICD-10-CM | POA: Insufficient documentation

## 2015-02-13 NOTE — Patient Instructions (Signed)
Quad Set   With other leg bent, foot flat, slowly tighten muscles on thigh of straight leg while counting out loud to 3. Repeat with other leg. Repeat 10 times. Do 2 sessions per day.  http://gt2.exer.us/276   Copyright  VHI. All rights reserved.   Functional Quadriceps: Chair Squat   Keeping feet flat on floor, shoulder width apart, squat as low as is comfortable. Use support as necessary. Repeat 10 times per set. Do 1 sets per session. Do 2 sessions per day.  http://orth.exer.us/737   Copyright  VHI. All rights reserved.

## 2015-02-14 ENCOUNTER — Ambulatory Visit (HOSPITAL_COMMUNITY): Payer: 59

## 2015-02-14 DIAGNOSIS — M25562 Pain in left knee: Secondary | ICD-10-CM

## 2015-02-14 DIAGNOSIS — R2681 Unsteadiness on feet: Secondary | ICD-10-CM

## 2015-02-14 NOTE — Patient Instructions (Signed)
Belly Breathing   Inhaling, relax the belly. Exhaling, pull belly toward spine, to "hug the baby". (Can be done with Kegels.) Repeat 10 times. Do 2 times per day.  Copyright  VHI. All rights reserved.   Stabilization: Transverse Abdominus Contraction - Supine   Lie with knees bent, feet flat. Place fingers on abdominal muscles just inside pelvis. Contract abdominals, pulling naval toward spine. Feel muscle contract, but keep pelvis and back still. Repeat 10x times per set. Do 1 set per session. Do 7 sessions per week.  Copyright  VHI. All rights reserved.

## 2015-02-14 NOTE — Therapy (Signed)
[place], [name], [address] Phone: [phone]   Fax:  [phone]  Physical Therapy Evaluation  Patient Details  Name: [name] MRN: [id number] Date of Birth: [birth year] Referring Provider:  Marchia Bond, MD  Encounter Date: 02/13/2015      PT End of Session - 02/13/15 1630    Visit Number 1   Number of Visits 16   Date for PT Re-Evaluation 04/15/15   Authorization Type [employer] Choice Plus    Authorization Time Period 02/13/15- 04/15/15   Authorization - Visit Number 1   Authorization - Number of Visits 16   PT Start Time [id number]   PT Stop Time 1435   PT Time Calculation (min) 46 min   Activity Tolerance Patient tolerated treatment well;No increased pain   Behavior During Therapy [facility] for tasks assessed/performed      Past Medical History  Diagnosis Date  . Hypertension   . High cholesterol   . Heart murmur   . Type II diabetes mellitus   . Anginal pain     "often; usually when thyroid goes out of wack and heart starts fluttering" (06/18/2013)  . Dysrhythmia     "palpitation-type feelings; related to thyroid" (06/18/2013)  . Carotid stenosis ~ 01/2013    "bilaterally; ~ 50%; dx'd/carotid US" (06/18/2013)  . Hypothyroidism   . Pneumonia ?1980's; 09/2011    "twice" (06/18/2013)  . Exertional shortness of breath   . Sleep apnea     "have mask; haven't worn it in ~ 1 yr" (06/18/2013)  . Anemia     "as a kid" (06/18/2013)  . GERD (gastroesophageal reflux disease)   . [id number])     "monthly" (06/18/2013)  . Arthritis     "joints probably" (101/27/2014)  . Gout   . Membranous glomerulonephritis   . Hepatitis, unspecified 1972    "husband had hepatitis; my mother, daughter, and me all had to get the gammaglobulin shots" (06/18/2013)    Past Surgical History  Procedure Laterality Date  . Cesarean section  1971; 1977  . Shoulder arthroscopy w/ rotator cuff repair Right 2009  . Bunionectomy  Bilateral [id number]    "once on each side" (06/18/2013)  . Cholecystectomy  ?1998  . Renal biopsy Left ~ 1997; 06/18/2013    There were no vitals filed for this visit.  Visit Diagnosis:  Left knee pain - Plan: PT plan of care cert/re-cert  Unsteady gait - Plan: PT plan of care cert/re-cert      Subjective Assessment - 02/13/15 1624    Subjective Pt reports acute onset of L medial knee pain during a trip to [place], [place] upon which pt underwent excessive walking c gradually worseing symptomes. Pt has been checked out by PCP and ortho consult, and now presents for PT. Pt reports initital pain was intermittent in nature, stabing and burning. Pt denies acute trauma. Pt reports since injection into knee on 6/20, pain has constantly hurt c ache.  Pt reports knees feels fine upon waking but will hurt more with activity throughout day.    How long can you sit comfortably? 30 minutes prior to elevation for comfort    How long can you stand comfortably? 1 hour   How long can you walk comfortably? 1 hour    Diagnostic tests x rays at ortho   Patient Stated Goals Reduce pain, improve function activity tolerance, and avoid surgery.    Currently in Pain? Yes   Pain Score 1  Pain Location Knee   Pain Orientation Left;Medial   Pain Descriptors / Indicators Aching   Pain Onset In the past 7 days   Pain Frequency Constant   Aggravating Factors  walking, sitting, activity   Pain Relieving Factors ice, tramadol   Effect of Pain on Daily Activities (unable to take nsaids due to kidney problems)             [facility] PT Assessment - 02/13/15 1507    Assessment   Medical Diagnosis L medial knee pain   Onset Date/Surgical Date 01/13/15   Hand Dominance Right   Prior Therapy yes, for rotator cuff surgery    Balance Screen   Has the patient fallen in the past 6 months No   Has the patient had a decrease in activity level because of a fear of falling?  No   Is the patient reluctant to leave their  home because of a fear of falling?  No   Prior Function   Level of Independence Independent   Vocation Retired  Marine scientist   Observation/Other Assessments   Focus on Therapeutic Outcomes (FOTO)  42   Sensation   Light Touch Appears Intact   AROM   Overall AROM  Within functional limits for tasks performed   PROM   Right Hip External Rotation  60   Right Hip Internal Rotation  70   Left Hip External Rotation  35   Left Hip Internal Rotation  75   Left Knee Extension -5  R: -10   Left Knee Flexion 162  R:165   Strength   Overall Strength Deficits  BLE 5/5 throughout c exception to: hip flexion 4/5 bilat   Right Knee   Right Knee Extension -10   Right Knee Flexion 165   Special Tests    Special Tests --  Apley's compression distraction negative.    Lumbar Tests other   Hip Special Tests  --  negative   Meniscus Tests Apley's Compression;Apley's Distraction  both negative   other   Findings Negative   Side  Left   Comments repeated flexion, extension lumbar spine   Hip Scouring   Findings Negative   Side Left   Apley's Compression   Findings Negative   Side  Left   Apley's Distraction   Findings Negative   Side Left                   [facility] Adult PT Treatment/Exercise - 02/13/15 1623    Knee/Hip Exercises: Standing   Functional Squat 1 set;10 reps  chair for tactile feedback   Knee/Hip Exercises: Supine   Quad Sets AROM;Strengthening;Left;1 set;10 reps  3 sec hold                PT Education - 02/13/15 1630    Education provided No          PT Short Term Goals - 02/13/15 1640    PT SHORT TERM GOAL #1   Title Pt will demonstrate ability to perform sitting without increase in pain for >2 hours to show return to plof.    PT SHORT TERM GOAL #2   Title Pt will demonstrate ability to perform standing without increase in pain for >2 hours to show return to plof.    PT SHORT TERM GOAL #3   Title Pt will demonstrate ability to perform walking  without increase in pain for >2 hours to show return to plof.  PT Long Term Goals - 02/13/15 1641    PT LONG TERM GOAL #1   Title Pt will demonstrate ability to perform sitting without increase in pain for >3 hours to show return to plof.    PT LONG TERM GOAL #2   Title Pt will demonstrate ability to perform standing without increase in pain for >4 hours to show return to plof.    PT LONG TERM GOAL #3   Title Pt will demonstrate ability to perform standing without increase in pain for >4 hours to show return to plof.                Plan - 02/13/15 1633    Clinical Impression Statement Pt presenting with subacute onset of medial knee pain. Pt pain has been intermittent, sharp and burning. Screening rules out signs consistent c origination of symptoms from Lumbar spine, Hip OA, or meniscal pathology. Subjective report also not consistent c knee OA, but more likely acute onset of medial impingement pain, possibly bursa, worsened with increased activity, likely related to hypermobility/hyperextension of knee joint. Pt presenting c impairment of pain and swelling and joint instability and will benefit from strengthening to restore PLOF in moblity.    Pt will benefit from skilled therapeutic intervention in order to improve on the following deficits Difficulty walking;Abnormal gait;Decreased activity tolerance;Hypermobility;Pain;Decreased strength;Increased edema   Rehab Potential Good   PT Frequency 2x / week   PT Duration 8 weeks   PT Treatment/Interventions Balance training;Therapeutic exercise;Therapeutic activities;Manual techniques;Stair training;Gait training   PT Next Visit Plan Progress closed chain strengtheng of quads and hips.    PT Home Exercise Plan Quad sets, and functional squats.    Consulted and Agree with Plan of Care Patient         Problem List Patient Active Problem List   Diagnosis Date Noted  . HTN (hypertension) 04/08/2013  . Membranous  glomerulonephritis 04/08/2013  . Hypothyroidism 04/08/2013  . GERD (gastroesophageal reflux disease) 04/08/2013  . OSA (obstructive sleep apnea) 04/08/2013  . Hyperlipidemia 04/08/2013  . DM2 (diabetes mellitus, type 2) 04/08/2013    Keiarra Charon C 02/14/2015, 8:28 AM  8:29 AM  Etta Grandchild, PT, DPT [place] License # [id number]       [facility] [address] [place], [name], [address] Phone: [phone]   Fax:  [phone]

## 2015-02-15 NOTE — Therapy (Signed)
[place], [name], [address] Phone: [phone]   Fax:  [phone]  Physical Therapy Treatment  Patient Details  Name: [name] MRN: [id number] Date of Birth: April 11, 1951 Referring Provider:  Sharilyn Sites, MD  Encounter Date: 02/14/2015      PT End of Session - 02/15/15 0604    Visit Number 2   Number of Visits 16   Date for PT Re-Evaluation 04/15/15   Authorization Type [employer] Choice Plus    Authorization Time Period 02/13/15- 04/15/15   Authorization - Visit Number 2   Authorization - Number of Visits 16   PT Start Time [id number]   PT Stop Time 1432   PT Time Calculation (min) 38 min   Activity Tolerance Patient tolerated treatment well;No increased pain   Behavior During Therapy [facility] for tasks assessed/performed      Past Medical History  Diagnosis Date  . Hypertension   . High cholesterol   . Heart murmur   . Type II diabetes mellitus   . Anginal pain     "often; usually when thyroid goes out of wack and heart starts fluttering" (06/18/2013)  . Dysrhythmia     "palpitation-type feelings; related to thyroid" (06/18/2013)  . Carotid stenosis ~ 01/2013    "bilaterally; ~ 50%; dx'd/carotid US" (06/18/2013)  . Hypothyroidism   . Pneumonia ?1980's; 09/2011    "twice" (06/18/2013)  . Exertional shortness of breath   . Sleep apnea     "have mask; haven't worn it in ~ 1 yr" (06/18/2013)  . Anemia     "as a kid" (06/18/2013)  . GERD (gastroesophageal reflux disease)   . [id number]     "monthly" (06/18/2013)  . Arthritis     "joints probably" (101/27/2014)  . Gout   . Membranous glomerulonephritis   . Hepatitis, unspecified 1972    "husband had hepatitis; my mother, daughter, and me all had to get the gammaglobulin shots" (06/18/2013)    Past Surgical History  Procedure Laterality Date  . Cesarean section  1971; 1977  . Shoulder arthroscopy w/ rotator cuff repair Right 2009  . Bunionectomy  Bilateral [id number]    "once on each side" (06/18/2013)  . Cholecystectomy  ?1998  . Renal biopsy Left ~ 1997; 06/18/2013    There were no vitals filed for this visit.  Visit Diagnosis:  Left knee pain  Unsteady gait      Subjective Assessment - 02/14/15 1402    Subjective Pt reports since eval yesterday, pain has resolved from constant to intermittent, and overall feeling much better.  HEP reviewed as correct.    Pertinent History Pt reports acute onset of L medial knee pain during a trip to [place], [place] upon which pt underwent excessive walking c gradually worseing symptomes. Pt has been checked out by PCP and ortho consult, and now presents for PT. Pt reports initital pain was intermittent in nature, stabing and burning. Pt denies acute trauma. Pt reports since injection into knee on 6/20, pain has constantly hurt c ache.  Pt reports knees feels fine upon waking but will hurt more with activity throughout day.    Patient Stated Goals Reduce pain, improve function activity tolerance, and avoid surgery.                          [facility] Adult PT Treatment/Exercise - 02/14/15 1412    Knee/Hip Exercises: Standing   Functional Squat 1 set;10 reps  chair for tactile feedback   Knee/Hip Exercises: Seated   Long Arc Quad Strengthening;2 sets;Both;15 reps   Long Arc Quad Weight 0 lbs.   Knee/Hip Exercises: Supine   Quad Sets AROM;Strengthening;Left;1 set;10 reps  3 sec hold   Heel Slides AROM;1 set;5 reps  ilat c BP cuff at 48mmHg   Other Supine Knee/Hip Exercises Transverse Abdomins activation 10x3 sec c BP cuff   10x supine, 10x quadruped.   Other Supine Knee/Hip Exercises Transverse Abdominus activation  c postural breathing, 10x seated, 10x quadruped.                PT Education - 02/15/15 0603    Education provided Yes   Education Details Using yoga belly breathing for better activation of Transverse abdominus to correct dysfunctional breathing patterns  assoicated c belly holding.    Person(s) Educated Patient   Methods Explanation   Comprehension Verbalized understanding          PT Short Term Goals - 02/13/15 1640    PT SHORT TERM GOAL #1   Title Pt will demonstrate ability to perform sitting without increase in pain for >2 hours to show return to plof.    PT SHORT TERM GOAL #2   Title Pt will demonstrate ability to perform standing without increase in pain for >2 hours to show return to plof.    PT SHORT TERM GOAL #3   Title Pt will demonstrate ability to perform walking without increase in pain for >2 hours to show return to plof.            PT Long Term Goals - 02/13/15 1641    PT LONG TERM GOAL #1   Title Pt will demonstrate ability to perform sitting without increase in pain for >3 hours to show return to plof.    PT LONG TERM GOAL #2   Title Pt will demonstrate ability to perform standing without increase in pain for >4 hours to show return to plof.    PT LONG TERM GOAL #3   Title Pt will demonstrate ability to perform standing without increase in pain for >4 hours to show return to plof.                Plan - 02/15/15 0604    Clinical Impression Statement Pt tolerating treatment session well, motivated and able to complete entire PT session as planned. Pt continues to make progress toward goals as evidenced by decrease in pain and improved indep in HEP. Pt's greatest limitation continues to be poor ability to perform isolated activation of core stability muscles, which continues to limit ability to perform dynamic core stabilization training c LE functional tasks. This will be a crucial part of getting patient out of lower-crossed-syndrome associated postural changes mediated by fatigue and resultant in reinjury of anterior knee impingement. Patient presenting with impairments of muscle weakness, pain, hypermoblity, and activity tolerance, limiting ability to perform ADL and mobility tasks at  baseline level of  function. Patient will benefit from skilled intervention to address the above impairments and limitations, in order to restore to prior level of function, improve patient safety upon discharge, and to restore quality of life.   Pt will benefit from skilled therapeutic intervention in order to improve on the following deficits Difficulty walking;Abnormal gait;Decreased activity tolerance;Hypermobility;Pain;Decreased strength;Increased edema   Rehab Potential Good   Clinical Impairments Affecting Rehab Potential bilat genu recurvatum; generalized congenital global hypermobility   PT Frequency 2x / week   PT Duration  8 weeks   PT Treatment/Interventions Balance training;Therapeutic exercise;Therapeutic activities;Manual techniques;Stair training;Gait training   PT Next Visit Plan Progress closed chain strengtheng of quads and hips. Progress BP mediated dynamic core stabilization and TrA activation.    PT Home Exercise Plan Quad sets, and functional squats; updated to include TrA activation in hook-lying as well as belly breathing.    Consulted and Agree with Plan of Care Patient        Problem List Patient Active Problem List   Diagnosis Date Noted  . HTN (hypertension) 04/08/2013  . Membranous glomerulonephritis 04/08/2013  . Hypothyroidism 04/08/2013  . GERD (gastroesophageal reflux disease) 04/08/2013  . OSA (obstructive sleep apnea) 04/08/2013  . Hyperlipidemia 04/08/2013  . DM2 (diabetes mellitus, type 2) 04/08/2013    Alline Pio C 02/15/2015, 6:12 AM  6:12 AM  Etta Grandchild, PT, DPT [place] License # [id number]       [facility] [address] [place], [name], [address] Phone: [phone]   Fax:  [phone]

## 2015-02-17 ENCOUNTER — Ambulatory Visit (HOSPITAL_COMMUNITY): Payer: 59 | Admitting: Physical Therapy

## 2015-02-17 DIAGNOSIS — M25562 Pain in left knee: Secondary | ICD-10-CM | POA: Diagnosis not present

## 2015-02-17 DIAGNOSIS — R2681 Unsteadiness on feet: Secondary | ICD-10-CM

## 2015-02-17 NOTE — Patient Instructions (Signed)
Step-Up: Forward   Step up forward. Keep pelvis level and spine neutral. Do _10__ times, each leg, _1-2__ times per day.   Step-Up: Lateral   Step up to side with right leg. Bring other foot up onto _6_ inch step. Return to floor position with left leg. Repeat _10___ times per session. Do __1-2__ sessions per day.  Copyright  [employer]. All rights reserved.

## 2015-02-17 NOTE — Therapy (Signed)
[place], [name], [address] Phone: [phone]   Fax:  [phone]  Physical Therapy Treatment  Patient Details  Name: [name] MRN: [id number] Date of Birth: [birth year] Referring Provider:  Marchia Bond, MD  Encounter Date: 02/17/2015      PT End of Session - 02/17/15 1148    Visit Number 3   Number of Visits 16   Date for PT Re-Evaluation 04/15/15   Authorization Type [employer] Choice Plus    Authorization Time Period 02/13/15- 04/15/15   Authorization - Visit Number 3   Authorization - Number of Visits 16   PT Start Time [id number]   PT Stop Time 1142   PT Time Calculation (min) 39 min   Activity Tolerance Patient tolerated treatment well;No increased pain   Behavior During Therapy [facility] for tasks assessed/performed      Past Medical History  Diagnosis Date  . Hypertension   . High cholesterol   . Heart murmur   . Type II diabetes mellitus   . Anginal pain     "often; usually when thyroid goes out of wack and heart starts fluttering" (06/18/2013)  . Dysrhythmia     "palpitation-type feelings; related to thyroid" (06/18/2013)  . Carotid stenosis ~ 01/2013    "bilaterally; ~ 50%; dx'd/carotid US" (06/18/2013)  . Hypothyroidism   . Pneumonia ?1980's; 09/2011    "twice" (06/18/2013)  . Exertional shortness of breath   . Sleep apnea     "have mask; haven't worn it in ~ 1 yr" (06/18/2013)  . Anemia     "as a kid" (06/18/2013)  . GERD (gastroesophageal reflux disease)   . [id number])     "monthly" (06/18/2013)  . Arthritis     "joints probably" (101/27/2014)  . Gout   . Membranous glomerulonephritis   . Hepatitis, unspecified 1972    "husband had hepatitis; my mother, daughter, and me all had to get the gammaglobulin shots" (06/18/2013)    Past Surgical History  Procedure Laterality Date  . Cesarean section  1971; 1977  . Shoulder arthroscopy w/ rotator cuff repair Right 2009  . Bunionectomy  Bilateral [id number]    "once on each side" (06/18/2013)  . Cholecystectomy  ?1998  . Renal biopsy Left ~ 1997; 06/18/2013    There were no vitals filed for this visit.  Visit Diagnosis:  Left knee pain  Unsteady gait      Subjective Assessment - 02/17/15 1104    Subjective Pt reports that she has no pain today, and her knee has been feeling better. She had slight pain over the weekend, and reports some slight tightness in her L hamstrings.            [facility] Adult PT Treatment/Exercise - 02/17/15 0001    Knee/Hip Exercises: Standing   Forward Lunges 10 reps;Both   Side Lunges 10 reps;Both   Lateral Step Up 2 sets;10 reps;Left;Step Height: 6"   Forward Step Up 2 sets;10 reps;Step Height: 6";Both   Functional Squat 1 set;15 reps  at chair for tactile feedback   Knee/Hip Exercises: Seated   Long Arc Quad Strengthening;2 sets;Both;15 reps   Long Arc Quad Weight 3 lbs.   Knee/Hip Exercises: Supine   Quad Sets AROM;Strengthening;Left;1 set;15 reps  3 sec hold   Other Supine Knee/Hip Exercises Transverse Abdomins activation 10x3 sec c BP cuff   10x supine            PT Education - 02/17/15 1148  Education provided Yes   Education Details Belly breathing, progressed HEP.    Person(s) Educated Patient   Methods Explanation;Handout   Comprehension Verbalized understanding;Returned demonstration          PT Short Term Goals - 02/13/15 1640    PT SHORT TERM GOAL #1   Title Pt will demonstrate ability to perform sitting without increase in pain for >2 hours to show return to plof.    PT SHORT TERM GOAL #2   Title Pt will demonstrate ability to perform standing without increase in pain for >2 hours to show return to plof.    PT SHORT TERM GOAL #3   Title Pt will demonstrate ability to perform walking without increase in pain for >2 hours to show return to plof.            PT Long Term Goals - 02/13/15 1641    PT LONG TERM GOAL #1   Title Pt will demonstrate  ability to perform sitting without increase in pain for >3 hours to show return to plof.    PT LONG TERM GOAL #2   Title Pt will demonstrate ability to perform standing without increase in pain for >4 hours to show return to plof.    PT LONG TERM GOAL #3   Title Pt will demonstrate ability to perform standing without increase in pain for >4 hours to show return to plof.                Plan - 02/17/15 1149    Clinical Impression Statement Pt was able to tolerate progression of closed chain BLE strengthening without any c/o increased pain. She required verbal cueing during TrA activation to prevent holding her breath, and also required tactile cueing and visual cueing with BP cuff to contract TrA correctly. Pt also required cueing during forward and lateral lunges to maintain upright posture for proper form. She will benefit from continued core strengthening and closed chain BLE strengthening.    PT Next Visit Plan Continue with closed chain BLE strengthening, review HEP of forward and lateral step ups, continue with BP cuff stabilization training.         Problem List Patient Active Problem List   Diagnosis Date Noted  . HTN (hypertension) 04/08/2013  . Membranous glomerulonephritis 04/08/2013  . Hypothyroidism 04/08/2013  . GERD (gastroesophageal reflux disease) 04/08/2013  . OSA (obstructive sleep apnea) 04/08/2013  . Hyperlipidemia 04/08/2013  . DM2 (diabetes mellitus, type 2) 04/08/2013    Hilma Favors, PT, DPT [id number] 02/17/2015, 11:55 AM  [place], [name], [address] Phone: [phone]   Fax:  [phone]

## 2015-02-21 ENCOUNTER — Ambulatory Visit (HOSPITAL_COMMUNITY): Payer: 59 | Attending: Orthopedic Surgery | Admitting: Physical Therapy

## 2015-02-21 DIAGNOSIS — M25562 Pain in left knee: Secondary | ICD-10-CM | POA: Diagnosis not present

## 2015-02-21 DIAGNOSIS — R2681 Unsteadiness on feet: Secondary | ICD-10-CM

## 2015-02-21 NOTE — Therapy (Signed)
[place], [name], [address] Phone: [phone]   Fax:  [phone]  Physical Therapy Treatment  Patient Details  Name: [name] MRN: [id number] Date of Birth: 03/16/1951 Referring Provider:  Marchia Bond, MD  Encounter Date: 02/21/2015      PT End of Session - 02/21/15 1438    Visit Number 4   Number of Visits 16   Date for PT Re-Evaluation 04/15/15   Authorization Type [employer] Choice Plus    Authorization Time Period 02/13/15- 04/15/15   Authorization - Visit Number 4   Authorization - Number of Visits 16   PT Start Time 1350   PT Stop Time 1430   PT Time Calculation (min) 40 min   Activity Tolerance Patient tolerated treatment well   Behavior During Therapy [facility] for tasks assessed/performed      Past Medical History  Diagnosis Date  . Hypertension   . High cholesterol   . Heart murmur   . Type II diabetes mellitus   . Anginal pain     "often; usually when thyroid goes out of wack and heart starts fluttering" (06/18/2013)  . Dysrhythmia     "palpitation-type feelings; related to thyroid" (06/18/2013)  . Carotid stenosis ~ 01/2013    "bilaterally; ~ 50%; dx'd/carotid US" (06/18/2013)  . Hypothyroidism   . Pneumonia ?1980's; 09/2011    "twice" (06/18/2013)  . Exertional shortness of breath   . Sleep apnea     "have mask; haven't worn it in ~ 1 yr" (06/18/2013)  . Anemia     "as a kid" (06/18/2013)  . GERD (gastroesophageal reflux disease)   . [id number])     "monthly" (06/18/2013)  . Arthritis     "joints probably" (101/27/2014)  . Gout   . Membranous glomerulonephritis   . Hepatitis, unspecified 1972    "husband had hepatitis; my mother, daughter, and me all had to get the gammaglobulin shots" (06/18/2013)    Past Surgical History  Procedure Laterality Date  . Cesarean section  1971; 1977  . Shoulder arthroscopy w/ rotator cuff repair Right 2009  . Bunionectomy Bilateral [id number]   "once on each side" (06/18/2013)  . Cholecystectomy  ?1998  . Renal biopsy Left ~ 1997; 06/18/2013    There were no vitals filed for this visit.  Visit Diagnosis:  Left knee pain  Unsteady gait      Subjective Assessment - 02/21/15 1352    Subjective Patient reports that she is not having any pain today, doing well and needs to take her dog to the vet after PT today    Pertinent History Pt reports acute onset of L medial knee pain during a trip to [place], [place] upon which pt underwent excessive walking c gradually worseing symptomes. Pt has been checked out by PCP and ortho consult, and now presents for PT. Pt reports initital pain was intermittent in nature, stabing and burning. Pt denies acute trauma. Pt reports since injection into knee on 6/20, pain has constantly hurt c ache.  Pt reports knees feels fine upon waking but will hurt more with activity throughout day.    Currently in Pain? No/denies                         [facility] Adult PT Treatment/Exercise - 02/21/15 0001    Knee/Hip Exercises: Stretches   Active Hamstring Stretch 3 reps;30 seconds   Active Hamstring Stretch Limitations 12 inch box  Piriformis Stretch 3 reps;30 seconds   Piriformis Stretch Limitations seated    Gastroc Stretch 3 reps;30 seconds   Gastroc Stretch Limitations slantboard    Knee/Hip Exercises: Aerobic   Nustep Seat 7 hills 3 level 2 for 10 minutes    Knee/Hip Exercises: Standing   Heel Raises Both;1 set;15 reps   Heel Raises Limitations floor    Forward Lunges Both;1 set;10 reps   Forward Lunges Limitations 6 inch step    Side Lunges Both;1 set;10 reps   Side Lunges Limitations 6 inch step    Forward Step Up 1 set;15 reps   Forward Step Up Limitations 6 inch box    Rocker Board Limitations x20AP, x20 lateral U HHA    Other Standing Knee Exercises 3D hip excursions 1x15 neutral stance    Other Standing Knee Exercises Hip IR box walks 1x10 each side; standing hip ABD 2x56ft                  PT Education - 02/21/15 1438    Education provided No          PT Short Term Goals - 02/13/15 1640    PT SHORT TERM GOAL #1   Title Pt will demonstrate ability to perform sitting without increase in pain for >2 hours to show return to plof.    PT SHORT TERM GOAL #2   Title Pt will demonstrate ability to perform standing without increase in pain for >2 hours to show return to plof.    PT SHORT TERM GOAL #3   Title Pt will demonstrate ability to perform walking without increase in pain for >2 hours to show return to plof.            PT Long Term Goals - 02/13/15 1641    PT LONG TERM GOAL #1   Title Pt will demonstrate ability to perform sitting without increase in pain for >3 hours to show return to plof.    PT LONG TERM GOAL #2   Title Pt will demonstrate ability to perform standing without increase in pain for >4 hours to show return to plof.    PT LONG TERM GOAL #3   Title Pt will demonstrate ability to perform standing without increase in pain for >4 hours to show return to plof.                Plan - 02/21/15 1439    Clinical Impression Statement Continued and progressed closed chain functional exercises and stretches in closed chain today, without any increased pain and apparent good tolerance by patient. Introduced tasks such as 3D hip excursions, hip ABD endurance walking, hip IR box walks, and Nustep. Patient had 0/10 pain at end of session but did state that she had occasional knee twinges during standing hip ABD walk today.    Pt will benefit from skilled therapeutic intervention in order to improve on the following deficits Difficulty walking;Abnormal gait;Decreased activity tolerance;Hypermobility;Pain;Decreased strength;Increased edema   Rehab Potential Good   Clinical Impairments Affecting Rehab Potential bilat genu recurvatum; generalized congenital global hypermobility   PT Frequency 2x / week   PT Duration 8 weeks   PT  Treatment/Interventions Balance training;Therapeutic exercise;Therapeutic activities;Manual techniques;Stair training;Gait training   PT Next Visit Plan Continue with closed chain BLE strengthening, review HEP of forward and lateral step ups, continue with BP cuff stabilization training.    PT Home Exercise Plan Quad sets, and functional squats; updated to include TrA activation in hook-lying as well  as belly breathing.    Consulted and Agree with Plan of Care Patient        Problem List Patient Active Problem List   Diagnosis Date Noted  . HTN (hypertension) 04/08/2013  . Membranous glomerulonephritis 04/08/2013  . Hypothyroidism 04/08/2013  . GERD (gastroesophageal reflux disease) 04/08/2013  . OSA (obstructive sleep apnea) 04/08/2013  . Hyperlipidemia 04/08/2013  . DM2 (diabetes mellitus, type 2) 04/08/2013    Deniece Ree PT, DPT [place] [address] [place], [name], [address] Phone: [phone]   Fax:  [phone]

## 2015-02-26 ENCOUNTER — Ambulatory Visit (HOSPITAL_COMMUNITY): Payer: 59 | Admitting: Physical Therapy

## 2015-02-28 ENCOUNTER — Ambulatory Visit (HOSPITAL_COMMUNITY): Payer: 59 | Admitting: Physical Therapy

## 2015-02-28 DIAGNOSIS — M25562 Pain in left knee: Secondary | ICD-10-CM

## 2015-02-28 DIAGNOSIS — R2681 Unsteadiness on feet: Secondary | ICD-10-CM

## 2015-02-28 NOTE — Therapy (Signed)
[name], [name], [address] Phone: [phone]   Fax:  [phone]  Physical Therapy Treatment  Patient Details  Name: [name] MRN: [id number] Date of Birth: 03-May-1951 Referring Anwar Crill:  Sharilyn Sites, MD  Encounter Date: 02/28/2015      PT End of Session - 02/28/15 1336    Visit Number 5   Number of Visits 16   Date for PT Re-Evaluation 04/15/15   Authorization Type [employer] Plus    Authorization Time Period 02/13/15- 04/15/15   Authorization - Visit Number 5   Authorization - Number of Visits 16   PT Start Time 1301   PT Stop Time 1342   PT Time Calculation (min) 41 min   Activity Tolerance Patient tolerated treatment well   Behavior During Therapy [facility] for tasks assessed/performed      Past Medical History  Diagnosis Date  . Hypertension   . High cholesterol   . Heart murmur   . Type II diabetes mellitus   . Anginal pain     "often; usually when thyroid goes out of wack and heart starts fluttering" (06/18/2013)  . Dysrhythmia     "palpitation-type feelings; related to thyroid" (06/18/2013)  . Carotid stenosis ~ 01/2013    "bilaterally; ~ 50%; dx'd/carotid US" (06/18/2013)  . Hypothyroidism   . Pneumonia ?1980's; 09/2011    "twice" (06/18/2013)  . Exertional shortness of breath   . Sleep apnea     "have mask; haven't worn it in ~ 1 yr" (06/18/2013)  . Anemia     "as a kid" (06/18/2013)  . GERD (gastroesophageal reflux disease)   . [id number])     "monthly" (06/18/2013)  . Arthritis     "joints probably" (101/27/2014)  . Gout   . Membranous glomerulonephritis   . Hepatitis, unspecified 1972    "husband had hepatitis; my mother, daughter, and me all had to get the gammaglobulin shots" (06/18/2013)    Past Surgical History  Procedure Laterality Date  . Cesarean section  1971; 1977  . Shoulder arthroscopy w/ rotator cuff repair Right 2009  . Bunionectomy Bilateral [id number]   "once on each side" (06/18/2013)  . Cholecystectomy  ?1998  . Renal biopsy Left ~ 1997; 06/18/2013    There were no vitals filed for this visit.  Visit Diagnosis:  Left knee pain  Unsteady gait      Subjective Assessment - 02/28/15 1303    Subjective Patient reports she is doing well today, felt a little tired after last session but no increased soreness    Pertinent History Pt reports acute onset of L medial knee pain during a trip to [place], [place] upon which pt underwent excessive walking c gradually worseing symptomes. Pt has been checked out by PCP and ortho consult, and now presents for PT. Pt reports initital pain was intermittent in nature, stabing and burning. Pt denies acute trauma. Pt reports since injection into knee on 6/20, pain has constantly hurt c ache.  Pt reports knees feels fine upon waking but will hurt more with activity throughout day.    Currently in Pain? No/denies                         [facility] Adult PT Treatment/Exercise - 02/28/15 0001    Knee/Hip Exercises: Stretches   Active Hamstring Stretch 3 reps;30 seconds   Active Hamstring Stretch Limitations 12 inch box    Piriformis Stretch 3 reps;30  seconds   Piriformis Stretch Limitations seated    Gastroc Stretch 3 reps;30 seconds   Gastroc Stretch Limitations slantboard    Knee/Hip Exercises: Aerobic   Nustep Seat 7 hills 3 level 2 for 10 minutes    Knee/Hip Exercises: Standing   Heel Raises Right;1 set;20 reps   Heel Raises Limitations floor    Forward Lunges Both;1 set;10 reps   Forward Lunges Limitations 4 inch box    Side Lunges Both;1 set;10 reps   Side Lunges Limitations 4 inch box    Functional Squat 3 sets;10 reps   Functional Squat Limitations toes neutral, in, out    Rocker Board Limitations x20AP, x20 lateral no HHA    Other Standing Knee Exercises 3D hip excursions 1x15 neutral stance    Other Standing Knee Exercises Standing hip ABD 2x26ft                PT  Education - 02/28/15 1335    Education provided Yes   Education Details educated to stretch ankle PF groups before bed, to check with kidney MD before trying pedialyte to assist in reducing cramps at night    Person(s) Educated Patient   Methods Explanation   Comprehension Verbalized understanding          PT Short Term Goals - 02/13/15 1640    PT SHORT TERM GOAL #1   Title Pt will demonstrate ability to perform sitting without increase in pain for >2 hours to show return to plof.    PT SHORT TERM GOAL #2   Title Pt will demonstrate ability to perform standing without increase in pain for >2 hours to show return to plof.    PT SHORT TERM GOAL #3   Title Pt will demonstrate ability to perform walking without increase in pain for >2 hours to show return to plof.            PT Long Term Goals - 02/13/15 1641    PT LONG TERM GOAL #1   Title Pt will demonstrate ability to perform sitting without increase in pain for >3 hours to show return to plof.    PT LONG TERM GOAL #2   Title Pt will demonstrate ability to perform standing without increase in pain for >4 hours to show return to plof.    PT LONG TERM GOAL #3   Title Pt will demonstrate ability to perform standing without increase in pain for >4 hours to show return to plof.                Plan - 02/28/15 1336    Clinical Impression Statement Continued functional exercises and stretches today with progression of all tasks. Patient appeared to tolerate session well today, however states that she has been having strong muscle cramps at night; advised to stretch gastrocs before bed and also advised to ask her kidney MD about drinking pedialyte to help reduce cramps.    Pt will benefit from skilled therapeutic intervention in order to improve on the following deficits Difficulty walking;Abnormal gait;Decreased activity tolerance;Hypermobility;Pain;Decreased strength;Increased edema   Rehab Potential Good   Clinical Impairments  Affecting Rehab Potential bilat genu recurvatum; generalized congenital global hypermobility   PT Frequency 2x / week   PT Duration 8 weeks   PT Treatment/Interventions Balance training;Therapeutic exercise;Therapeutic activities;Manual techniques;Stair training;Gait training   PT Next Visit Plan Continue with closed chain BLE strengthening, review HEP of forward and lateral step ups, continue with BP cuff stabilization training.    PT  Home Exercise Plan Quad sets, and functional squats; updated to include TrA activation in hook-lying as well as belly breathing.    Consulted and Agree with Plan of Care Patient        Problem List Patient Active Problem List   Diagnosis Date Noted  . HTN (hypertension) 04/08/2013  . Membranous glomerulonephritis 04/08/2013  . Hypothyroidism 04/08/2013  . GERD (gastroesophageal reflux disease) 04/08/2013  . OSA (obstructive sleep apnea) 04/08/2013  . Hyperlipidemia 04/08/2013  . DM2 (diabetes mellitus, type 2) 04/08/2013    Deniece Ree PT, DPT [place] [address] [place], [name], [address] Phone: [phone]   Fax:  [phone]

## 2015-03-03 ENCOUNTER — Ambulatory Visit (HOSPITAL_COMMUNITY): Payer: 59 | Admitting: Physical Therapy

## 2015-03-03 DIAGNOSIS — M25562 Pain in left knee: Secondary | ICD-10-CM | POA: Diagnosis not present

## 2015-03-03 DIAGNOSIS — R2681 Unsteadiness on feet: Secondary | ICD-10-CM

## 2015-03-03 NOTE — Therapy (Signed)
[place], [name], [address] Phone: [phone]   Fax:  [phone]  Physical Therapy Treatment  Patient Details  Name: [name] MRN: [id number] Date of Birth: 1951-06-15 Referring Provider:  Sharilyn Sites, MD  Encounter Date: 03/03/2015      PT End of Session - 03/03/15 1428    Visit Number 6   Number of Visits 16   Date for PT Re-Evaluation 04/15/15   Authorization Type [employer] Choice Plus    Authorization Time Period 02/13/15- 04/15/15   Authorization - Visit Number 6   Authorization - Number of Visits 16   PT Start Time 1346   PT Stop Time 1435   PT Time Calculation (min) 49 min   Activity Tolerance Patient tolerated treatment well   Behavior During Therapy [facility] for tasks assessed/performed      Past Medical History  Diagnosis Date  . Hypertension   . High cholesterol   . Heart murmur   . Type II diabetes mellitus   . Anginal pain     "often; usually when thyroid goes out of wack and heart starts fluttering" (06/18/2013)  . Dysrhythmia     "palpitation-type feelings; related to thyroid" (06/18/2013)  . Carotid stenosis ~ 01/2013    "bilaterally; ~ 50%; dx'd/carotid US" (06/18/2013)  . Hypothyroidism   . Pneumonia ?1980's; 09/2011    "twice" (06/18/2013)  . Exertional shortness of breath   . Sleep apnea     "have mask; haven't worn it in ~ 1 yr" (06/18/2013)  . Anemia     "as a kid" (06/18/2013)  . GERD (gastroesophageal reflux disease)   . [id number])     "monthly" (06/18/2013)  . Arthritis     "joints probably" (101/27/2014)  . Gout   . Membranous glomerulonephritis   . Hepatitis, unspecified 1972    "husband had hepatitis; my mother, daughter, and me all had to get the gammaglobulin shots" (06/18/2013)    Past Surgical History  Procedure Laterality Date  . Cesarean section  1971; 1977  . Shoulder arthroscopy w/ rotator cuff repair Right 2009  . Bunionectomy Bilateral [id number]   "once on each side" (06/18/2013)  . Cholecystectomy  ?1998  . Renal biopsy Left ~ 1997; 06/18/2013    There were no vitals filed for this visit.  Visit Diagnosis:  Left knee pain  Unsteady gait      Subjective Assessment - 03/03/15 1347    Subjective Pt denies having any pain in her knee today, however, when she went to bed last night, she was having pain underneath her kneecap.    Currently in Pain? No/denies   Pain Score 0-No pain              [facility] Adult PT Treatment/Exercise - 03/03/15 0001    Exercises   Exercises Knee/Hip   Knee/Hip Exercises: Stretches   Active Hamstring Stretch 3 reps;30 seconds   Active Hamstring Stretch Limitations 14 inch step   Piriformis Stretch 3 reps;30 seconds   Piriformis Stretch Limitations seated    Gastroc Stretch 3 reps;30 seconds   Gastroc Stretch Limitations slantboard    Knee/Hip Exercises: Aerobic   Nustep Seat 7 hills 3 level 2 for 10 minutes    Knee/Hip Exercises: Standing   Heel Raises Right;1 set;20 reps   Heel Raises Limitations floor    Forward Lunges Both;1 set;10 reps   Forward Lunges Limitations 4 inch box    Side Lunges Both;1 set;10 reps  Side Lunges Limitations 4 inch box    Forward Step Up 1 set;15 reps   Forward Step Up Limitations 6 inch box    Functional Squat 2 sets;15 reps   Wall Squat 10 reps   Rocker Board 2 minutes   Rocker Board Limitations no HHA   Other Standing Knee Exercises Standing hip ABD 2x75ft   Knee/Hip Exercises: Supine   Straight Leg Raise with External Rotation 2 sets;10 reps   Knee/Hip Exercises: Sidelying   Hip ABduction 2 sets;15 reps                PT Education - 03/03/15 1428    Education provided No          PT Short Term Goals - 02/13/15 1640    PT SHORT TERM GOAL #1   Title Pt will demonstrate ability to perform sitting without increase in pain for >2 hours to show return to plof.    PT SHORT TERM GOAL #2   Title Pt will demonstrate ability to perform  standing without increase in pain for >2 hours to show return to plof.    PT SHORT TERM GOAL #3   Title Pt will demonstrate ability to perform walking without increase in pain for >2 hours to show return to plof.            PT Long Term Goals - 02/13/15 1641    PT LONG TERM GOAL #1   Title Pt will demonstrate ability to perform sitting without increase in pain for >3 hours to show return to plof.    PT LONG TERM GOAL #2   Title Pt will demonstrate ability to perform standing without increase in pain for >4 hours to show return to plof.    PT LONG TERM GOAL #3   Title Pt will demonstrate ability to perform standing without increase in pain for >4 hours to show return to plof.                Plan - 03/03/15 1428    Clinical Impression Statement Treatment focused on improving quad and hip abductor strength in today's treatment. She responded well to the addition of wall squats and sidelying hip abduction, but fatigued easily with these exercises.    PT Next Visit Plan Continue with closed chain BLE strengthening, review HEP of forward and lateral step ups        Problem List Patient Active Problem List   Diagnosis Date Noted  . HTN (hypertension) 04/08/2013  . Membranous glomerulonephritis 04/08/2013  . Hypothyroidism 04/08/2013  . GERD (gastroesophageal reflux disease) 04/08/2013  . OSA (obstructive sleep apnea) 04/08/2013  . Hyperlipidemia 04/08/2013  . DM2 (diabetes mellitus, type 2) 04/08/2013    Hilma Favors, PT, DPT [phone] 03/03/2015, 2:30 PM  [place], [name], [address] Phone: [phone]   Fax:  [phone]

## 2015-03-06 ENCOUNTER — Ambulatory Visit (HOSPITAL_COMMUNITY): Payer: 59 | Admitting: Physical Therapy

## 2015-03-06 DIAGNOSIS — M25562 Pain in left knee: Secondary | ICD-10-CM | POA: Diagnosis not present

## 2015-03-06 DIAGNOSIS — R2681 Unsteadiness on feet: Secondary | ICD-10-CM

## 2015-03-06 NOTE — Therapy (Signed)
[place], [name], [address] Phone: [phone]   Fax:  [phone]  Physical Therapy Treatment  Patient Details  Name: [name] MRN: [id number] Date of Birth: 01/23/1951 Referring Provider:  Sharilyn Sites, MD  Encounter Date: 03/06/2015      PT End of Session - 03/06/15 1430    Visit Number 7   Number of Visits 16   Date for PT Re-Evaluation 04/15/15   Authorization Type [employer] Choice Plus    Authorization Time Period 02/13/15- 04/15/15   Authorization - Visit Number 7   Authorization - Number of Visits 16   PT Start Time 1346   PT Stop Time 1429   PT Time Calculation (min) 43 min   Activity Tolerance Patient tolerated treatment well   Behavior During Therapy [facility] for tasks assessed/performed      Past Medical History  Diagnosis Date  . Hypertension   . High cholesterol   . Heart murmur   . Type II diabetes mellitus   . Anginal pain     "often; usually when thyroid goes out of wack and heart starts fluttering" (06/18/2013)  . Dysrhythmia     "palpitation-type feelings; related to thyroid" (06/18/2013)  . Carotid stenosis ~ 01/2013    "bilaterally; ~ 50%; dx'd/carotid US" (06/18/2013)  . Hypothyroidism   . Pneumonia ?1980's; 09/2011    "twice" (06/18/2013)  . Exertional shortness of breath   . Sleep apnea     "have mask; haven't worn it in ~ 1 yr" (06/18/2013)  . Anemia     "as a kid" (06/18/2013)  . GERD (gastroesophageal reflux disease)   . VFIEPPIR(518.8)     "monthly" (06/18/2013)  . Arthritis     "joints probably" (101/27/2014)  . Gout   . Membranous glomerulonephritis   . Hepatitis, unspecified 1972    "husband had hepatitis; my mother, daughter, and me all had to get the gammaglobulin shots" (06/18/2013)    Past Surgical History  Procedure Laterality Date  . Cesarean section  1971; 1977  . Shoulder arthroscopy w/ rotator cuff repair Right 2009  . Bunionectomy Bilateral [id number]   "once on each side" (06/18/2013)  . Cholecystectomy  ?1998  . Renal biopsy Left ~ 1997; 06/18/2013    There were no vitals filed for this visit.  Visit Diagnosis:  Left knee pain  Unsteady gait      Subjective Assessment - 03/06/15 1349    Subjective Patient reports she is doing OK, after last session when she was driving to [facility] she had a sharp hip to back pain on R that was approx 8/10; it has since eased and she is not having any pain at the moment.    Pertinent History Pt reports acute onset of L medial knee pain during a trip to [place], [place] upon which pt underwent excessive walking c gradually worseing symptomes. Pt has been checked out by PCP and ortho consult, and now presents for PT. Pt reports initital pain was intermittent in nature, stabing and burning. Pt denies acute trauma. Pt reports since injection into knee on 6/20, pain has constantly hurt c ache.  Pt reports knees feels fine upon waking but will hurt more with activity throughout day.    Currently in Pain? No/denies                         [facility] Adult PT Treatment/Exercise - 03/06/15 0001    Knee/Hip Exercises:  Stretches   Active Hamstring Stretch 3 reps;30 seconds   Active Hamstring Stretch Limitations 12 inch step, 3 way   Piriformis Stretch 3 reps;30 seconds   Piriformis Stretch Limitations seated    Gastroc Stretch 3 reps;30 seconds   Gastroc Stretch Limitations slantboard    Knee/Hip Exercises: Standing   Forward Lunges Both;1 set;15 reps   Forward Lunges Limitations 4 inch box    Side Lunges Both;1 set;15 reps   Side Lunges Limitations 4 inch box    Forward Step Up Both;1 set;10 reps   Forward Step Up Limitations 8 inch box    Step Down Both;1 set;10 reps   Step Down Limitations 4 inch box    Functional Squat 3 sets;10 reps   Functional Squat Limitations toes neutral, in, out    Wall Squat 10 reps   Rocker Board Limitations x20 AP and lateral, no HHA    Other Standing Knee  Exercises 3D hip excursions toe touch 1x10 each side; 2D walking hip excursions 4x81f     Other Standing Knee Exercises Standing hip ABD 2x574f  Knee/Hip Exercises: Supine   Bridges Limitations 1x15 standard form    Straight Leg Raises Both;1 set;15 reps   Straight Leg Raises Limitations standard form    Knee/Hip Exercises: Sidelying   Clams 1x10 with red TB    Manual Therapy   Manual Therapy Muscle Energy Technique   Muscle Energy Technique corrected anteriorly rotated R pelvis with MET                 PT Education - 03/06/15 1430    Education provided Yes   Education Details reviewed HEP           PT Short Term Goals - 02/13/15 1640    PT SHORT TERM GOAL #1   Title Pt will demonstrate ability to perform sitting without increase in pain for >2 hours to show return to plof.    PT SHORT TERM GOAL #2   Title Pt will demonstrate ability to perform standing without increase in pain for >2 hours to show return to plof.    PT SHORT TERM GOAL #3   Title Pt will demonstrate ability to perform walking without increase in pain for >2 hours to show return to plof.            PT Long Term Goals - 02/13/15 1641    PT LONG TERM GOAL #1   Title Pt will demonstrate ability to perform sitting without increase in pain for >3 hours to show return to plof.    PT LONG TERM GOAL #2   Title Pt will demonstrate ability to perform standing without increase in pain for >4 hours to show return to plof.    PT LONG TERM GOAL #3   Title Pt will demonstrate ability to perform standing without increase in pain for >4 hours to show return to plof.                Plan - 03/06/15 1430    Clinical Impression Statement COntinued functional stretches and exercises today. Introduced toe touch 3D hip excursions and 2D walking hip excursions, also progressed closed chain exercises with good tolerance by patient today. Corrected R anteriorly rotated pelvis today as well.  Reviewed HEP.    Pt  will benefit from skilled therapeutic intervention in order to improve on the following deficits Difficulty walking;Abnormal gait;Decreased activity tolerance;Hypermobility;Pain;Decreased strength;Increased edema   Rehab Potential Good   Clinical Impairments Affecting  Rehab Potential bilat genu recurvatum; generalized congenital global hypermobility   PT Frequency 2x / week   PT Duration 8 weeks   PT Treatment/Interventions Balance training;Therapeutic exercise;Therapeutic activities;Manual techniques;Stair training;Gait training   PT Next Visit Plan Continue with closed chain BLE strengthening, review HEP of forward and lateral step ups   PT Home Exercise Plan Quad sets, and functional squats; updated to include TrA activation in hook-lying as well as belly breathing.    Consulted and Agree with Plan of Care Patient        Problem List Patient Active Problem List   Diagnosis Date Noted  . HTN (hypertension) 04/08/2013  . Membranous glomerulonephritis 04/08/2013  . Hypothyroidism 04/08/2013  . GERD (gastroesophageal reflux disease) 04/08/2013  . OSA (obstructive sleep apnea) 04/08/2013  . Hyperlipidemia 04/08/2013  . DM2 (diabetes mellitus, type 2) 04/08/2013    Deniece Ree PT, DPT [place] [address] [place], [name], [address] Phone: [phone]   Fax:  [phone]

## 2015-03-10 ENCOUNTER — Ambulatory Visit (HOSPITAL_COMMUNITY): Payer: 59 | Admitting: Physical Therapy

## 2015-03-13 ENCOUNTER — Ambulatory Visit (HOSPITAL_COMMUNITY): Payer: 59

## 2015-03-13 DIAGNOSIS — M25562 Pain in left knee: Secondary | ICD-10-CM | POA: Diagnosis not present

## 2015-03-13 DIAGNOSIS — R2681 Unsteadiness on feet: Secondary | ICD-10-CM

## 2015-03-13 NOTE — Therapy (Signed)
[facility], [name], [address] Phone: [phone]   Fax:  [phone]  Physical Therapy Treatment  Patient Details  Name: [name] MRN: [id number] Date of Birth: 25-Feb-1951 Referring Provider:  Sharilyn Sites, MD  Encounter Date: 03/13/2015      PT End of Session - 03/13/15 1358    Visit Number 8   Number of Visits 16   Date for PT Re-Evaluation 04/15/15   Authorization Type [employer] Plus    Authorization Time Period 02/13/15- 04/15/15   Authorization - Visit Number 8   Authorization - Number of Visits 16   PT Start Time 1350   PT Stop Time 1430   PT Time Calculation (min) 40 min   Activity Tolerance Patient tolerated treatment well   Behavior During Therapy [facility] for tasks assessed/performed      Past Medical History  Diagnosis Date  . Hypertension   . High cholesterol   . Heart murmur   . Type II diabetes mellitus   . Anginal pain     "often; usually when thyroid goes out of wack and heart starts fluttering" (06/18/2013)  . Dysrhythmia     "palpitation-type feelings; related to thyroid" (06/18/2013)  . Carotid stenosis ~ 01/2013    "bilaterally; ~ 50%; dx'd/carotid US" (06/18/2013)  . Hypothyroidism   . Pneumonia ?1980's; 09/2011    "twice" (06/18/2013)  . Exertional shortness of breath   . Sleep apnea     "have mask; haven't worn it in ~ 1 yr" (06/18/2013)  . Anemia     "as a kid" (06/18/2013)  . GERD (gastroesophageal reflux disease)   . [id number])     "monthly" (06/18/2013)  . Arthritis     "joints probably" (101/27/2014)  . Gout   . Membranous glomerulonephritis   . Hepatitis, unspecified 1972    "husband had hepatitis; my mother, daughter, and me all had to get the gammaglobulin shots" (06/18/2013)    Past Surgical History  Procedure Laterality Date  . Cesarean section  1971; 1977  . Shoulder arthroscopy w/ rotator cuff repair Right 2009  . Bunionectomy Bilateral [id number]   "once on each side" (06/18/2013)  . Cholecystectomy  ?1998  . Renal biopsy Left ~ 1997; 06/18/2013    There were no vitals filed for this visit.  Visit Diagnosis:  Left knee pain  Unsteady gait      Subjective Assessment - 03/13/15 1350    Subjective Pt stated pain free today.  Went to MD on Monday 18th with reports of MD happy with progress, stated she may discharge following next week (2 more sessions following this session).   Currently in Pain? No/denies            [facility] PT Assessment - 03/13/15 0001    PROM   Left Knee Extension 0   Left Knee Flexion 162   Right Knee   Right Knee Extension 0   Right Knee Flexion 165              [facility] Adult PT Treatment/Exercise - 03/13/15 0001    Exercises   Exercises Knee/Hip   Knee/Hip Exercises: Stretches   Active Hamstring Stretch 3 reps;30 seconds   Active Hamstring Stretch Limitations 14in step   Gastroc Stretch 3 reps;30 seconds   Knee/Hip Exercises: Standing   Lateral Step Up 15 reps;Hand Hold: 0;Step Height: 6"   Forward Step Up Left;15 reps;Hand Hold: 0;Step Height: 6"   Step Down Left;15 reps;Step  Height: 6";Hand Hold: 1   Functional Squat 15 reps   Wall Squat 10 reps;5 seconds   Stairs 5RT reciprocal pattern 7in height wiht 1 HR   SLS Lt 30", Rt19" max of 3   SLS with Vectors 5x5"   Other Standing Knee Exercises 3D hip excursion 10x   Other Standing Knee Exercises Sidestepping GTB 2RT, tandem stance 2x 30", tandem gait 2RT             PT Short Term Goals - 03/13/15 1359    PT SHORT TERM GOAL #1   Title Pt will demonstrate ability to perform sitting without increase in pain for >2 hours to show return to plof.    Status On-going   PT SHORT TERM GOAL #2   Title Pt will demonstrate ability to perform standing without increase in pain for >2 hours to show return to plof.    Status On-going   PT SHORT TERM GOAL #3   Title Pt will demonstrate ability to perform walking without increase in pain for >2  hours to show return to plof.    Status On-going           PT Long Term Goals - 03/13/15 1359    PT LONG TERM GOAL #1   Title Pt will demonstrate ability to perform sitting without increase in pain for >3 hours to show return to plof.    PT LONG TERM GOAL #2   Title Pt will demonstrate ability to perform standing without increase in pain for >4 hours to show return to plof.    PT LONG TERM GOAL #3   Title Pt will demonstrate ability to perform standing without increase in pain for >4 hours to show return to plof.                Plan - 03/13/15 1401    Clinical Impression Statement Session focus on functional strengthening exercises.  Pt able to demonstrate appropraite technique with all closed chain kinetic exercises with minimal cueing for technique.  Added balance activities to improve knee stabilty with min guard for LOB episodes, pt able to recover independently.  No reports of pain.  AROM 0-162 degrees.     PT Next Visit Plan Reassess in 2 more session per MD, anticapate DC.  Continue CKC for BLE strengthening        Problem List Patient Active Problem List   Diagnosis Date Noted  . HTN (hypertension) 04/08/2013  . Membranous glomerulonephritis 04/08/2013  . Hypothyroidism 04/08/2013  . GERD (gastroesophageal reflux disease) 04/08/2013  . OSA (obstructive sleep apnea) 04/08/2013  . Hyperlipidemia 04/08/2013  . DM2 (diabetes mellitus, type 2) 04/08/2013   Ihor Austin, [place]; [place]  Aldona Lento 03/13/2015, 2:32 PM  [facility] [address] [place], [name], [address] Phone: [phone]   Fax:  [phone]

## 2015-03-17 ENCOUNTER — Ambulatory Visit (HOSPITAL_COMMUNITY): Payer: 59 | Admitting: Physical Therapy

## 2015-03-17 DIAGNOSIS — M25562 Pain in left knee: Secondary | ICD-10-CM

## 2015-03-17 DIAGNOSIS — R2681 Unsteadiness on feet: Secondary | ICD-10-CM

## 2015-03-17 NOTE — Therapy (Signed)
[place], [name], [address] Phone: [phone]   Fax:  [phone]  Physical Therapy Treatment  Patient Details  Name: [name] MRN: [id number] Date of Birth: November 23, 1950 Referring Provider:  Sharilyn Sites, MD  Encounter Date: 03/17/2015      PT End of Session - 03/17/15 1419    Visit Number 9   Number of Visits 16   Date for PT Re-Evaluation 04/15/15   Authorization Type [employer] Choice Plus    Authorization Time Period 02/13/15- 04/15/15   Authorization - Visit Number 9   Authorization - Number of Visits 16   PT Start Time [id number]   PT Stop Time 1428   PT Time Calculation (min) 43 min   Activity Tolerance Patient tolerated treatment well   Behavior During Therapy [facility] for tasks assessed/performed      Past Medical History  Diagnosis Date  . Hypertension   . High cholesterol   . Heart murmur   . Type II diabetes mellitus   . Anginal pain     "often; usually when thyroid goes out of wack and heart starts fluttering" (06/18/2013)  . Dysrhythmia     "palpitation-type feelings; related to thyroid" (06/18/2013)  . Carotid stenosis ~ 01/2013    "bilaterally; ~ 50%; dx'd/carotid US" (06/18/2013)  . Hypothyroidism   . Pneumonia ?1980's; 09/2011    "twice" (06/18/2013)  . Exertional shortness of breath   . Sleep apnea     "have mask; haven't worn it in ~ 1 yr" (06/18/2013)  . Anemia     "as a kid" (06/18/2013)  . GERD (gastroesophageal reflux disease)   . [id number]     "monthly" (06/18/2013)  . Arthritis     "joints probably" (101/27/2014)  . Gout   . Membranous glomerulonephritis   . Hepatitis, unspecified 1972    "husband had hepatitis; my mother, daughter, and me all had to get the gammaglobulin shots" (06/18/2013)    Past Surgical History  Procedure Laterality Date  . Cesarean section  1971; 1977  . Shoulder arthroscopy w/ rotator cuff repair Right 2009  . Bunionectomy Bilateral [id number]     "once on each side" (06/18/2013)  . Cholecystectomy  ?1998  . Renal biopsy Left ~ 1997; 06/18/2013    There were no vitals filed for this visit.  Visit Diagnosis:  Left knee pain  Unsteady gait      Subjective Assessment - 03/17/15 1345    Subjective Patient pain free this afternoon, very pleasant    Pertinent History Pt reports acute onset of L medial knee pain during a trip to [place], [place] upon which pt underwent excessive walking c gradually worseing symptomes. Pt has been checked out by PCP and ortho consult, and now presents for PT. Pt reports initital pain was intermittent in nature, stabing and burning. Pt denies acute trauma. Pt reports since injection into knee on 6/20, pain has constantly hurt c ache.  Pt reports knees feels fine upon waking but will hurt more with activity throughout day.    Currently in Pain? No/denies                         [facility] Adult PT Treatment/Exercise - 03/17/15 0001    Knee/Hip Exercises: Stretches   Active Hamstring Stretch 3 reps;30 seconds   Active Hamstring Stretch Limitations 14in step   Piriformis Stretch 3 reps;30 seconds   Piriformis Stretch Limitations seated    Gastroc  Stretch 3 reps;30 seconds   Gastroc Stretch Limitations slantboard    Knee/Hip Exercises: Aerobic   Nustep seat 7 hills 3 level 3 for 10 minutes   Knee/Hip Exercises: Standing   Heel Raises Both;1 set;15 reps   Heel Raises Limitations BOSU    Forward Lunges Both;1 set;10 reps   Forward Lunges Limitations 2 inch box    Side Lunges Both;1 set;10 reps   Side Lunges Limitations 2 inch box    Forward Step Up Both;1 set;15 reps   Forward Step Up Limitations 8 inch box    Functional Squat 15 reps   Functional Squat Limitations on BOSU    Other Standing Knee Exercises 3D hip excursions 1x15 toe touch; step ups on BOSU 1x10 each leg    Other Standing Knee Exercises sit to stand with staggered stance 1x10 each sid; squat walks 2x59ft                   PT Education - 03/17/15 1418    Education provided No          PT Short Term Goals - 03/13/15 1359    PT SHORT TERM GOAL #1   Title Pt will demonstrate ability to perform sitting without increase in pain for >2 hours to show return to plof.    Status On-going   PT SHORT TERM GOAL #2   Title Pt will demonstrate ability to perform standing without increase in pain for >2 hours to show return to plof.    Status On-going   PT SHORT TERM GOAL #3   Title Pt will demonstrate ability to perform walking without increase in pain for >2 hours to show return to plof.    Status On-going           PT Long Term Goals - 03/13/15 1359    PT LONG TERM GOAL #1   Title Pt will demonstrate ability to perform sitting without increase in pain for >3 hours to show return to plof.    PT LONG TERM GOAL #2   Title Pt will demonstrate ability to perform standing without increase in pain for >4 hours to show return to plof.    PT LONG TERM GOAL #3   Title Pt will demonstrate ability to perform standing without increase in pain for >4 hours to show return to plof.                Plan - 03/17/15 1420    Clinical Impression Statement Focus on functional strength exercisess in CKC form today. Patient performed all exercises well with only occasional cues for form today. Introduced functional CKC exercises on BOSU today with good tolerance and no increases in pain during session.    Pt will benefit from skilled therapeutic intervention in order to improve on the following deficits Difficulty walking;Abnormal gait;Decreased activity tolerance;Hypermobility;Pain;Decreased strength;Increased edema   Rehab Potential Good   Clinical Impairments Affecting Rehab Potential bilat genu recurvatum; generalized congenital global hypermobility   PT Frequency 2x / week   PT Duration 8 weeks   PT Treatment/Interventions Balance training;Therapeutic exercise;Therapeutic activities;Manual  techniques;Stair training;Gait training   PT Next Visit Plan Re-assess next session with possible DC.    PT Home Exercise Plan Quad sets, and functional squats; updated to include TrA activation in hook-lying as well as belly breathing.    Consulted and Agree with Plan of Care Patient        Problem List Patient Active Problem List   Diagnosis Date  Noted  . HTN (hypertension) 04/08/2013  . Membranous glomerulonephritis 04/08/2013  . Hypothyroidism 04/08/2013  . GERD (gastroesophageal reflux disease) 04/08/2013  . OSA (obstructive sleep apnea) 04/08/2013  . Hyperlipidemia 04/08/2013  . DM2 (diabetes mellitus, type 2) 04/08/2013    Deniece Ree PT, DPT [facility] [address] [place], [name], [address] Phone: [phone]   Fax:  [phone]

## 2015-03-20 ENCOUNTER — Ambulatory Visit (HOSPITAL_COMMUNITY): Payer: 59 | Admitting: Physical Therapy

## 2015-03-20 DIAGNOSIS — M25562 Pain in left knee: Secondary | ICD-10-CM

## 2015-03-20 DIAGNOSIS — R2681 Unsteadiness on feet: Secondary | ICD-10-CM

## 2015-03-20 NOTE — Therapy (Signed)
[place], [name], [address] Phone: [phone]   Fax:  [phone]  Physical Therapy Treatment (Discharge)  Patient Details  Name: [name] MRN: [id number] Date of Birth: 06/07/1951 Referring Provider:  Marchia Bond, MD  Encounter Date: 03/20/2015      PT End of Session - 03/20/15 1623    Visit Number 10   Number of Visits 16   Date for PT Re-Evaluation 04/15/15   Authorization Type [employer] Choice Plus    Authorization Time Period 02/13/15- 04/15/15   Authorization - Visit Number 10   Authorization - Number of Visits 16   PT Start Time 1346   PT Stop Time 1430   PT Time Calculation (min) 44 min   Activity Tolerance Patient tolerated treatment well   Behavior During Therapy [facility] for tasks assessed/performed      Past Medical History  Diagnosis Date  . Hypertension   . High cholesterol   . Heart murmur   . Type II diabetes mellitus   . Anginal pain     "often; usually when thyroid goes out of wack and heart starts fluttering" (06/18/2013)  . Dysrhythmia     "palpitation-type feelings; related to thyroid" (06/18/2013)  . Carotid stenosis ~ 01/2013    "bilaterally; ~ 50%; dx'd/carotid US" (06/18/2013)  . Hypothyroidism   . Pneumonia ?1980's; 09/2011    "twice" (06/18/2013)  . Exertional shortness of breath   . Sleep apnea     "have mask; haven't worn it in ~ 1 yr" (06/18/2013)  . Anemia     "as a kid" (06/18/2013)  . GERD (gastroesophageal reflux disease)   . [id number](993.5)     "monthly" (06/18/2013)  . Arthritis     "joints probably" (101/27/2014)  . Gout   . Membranous glomerulonephritis   . Hepatitis, unspecified 1972    "husband had hepatitis; my mother, daughter, and me all had to get the gammaglobulin shots" (06/18/2013)    Past Surgical History  Procedure Laterality Date  . Cesarean section  1971; 1977  . Shoulder arthroscopy w/ rotator cuff repair Right 2009  . Bunionectomy Bilateral  [id number]    "once on each side" (06/18/2013)  . Cholecystectomy  ?1998  . Renal biopsy Left ~ 1997; 06/18/2013    There were no vitals filed for this visit.  Visit Diagnosis:  Left knee pain  Unsteady gait      Subjective Assessment - 03/20/15 1349    Subjective Patient reports she is having a little bit of a hard day today, has more to do with kidney issues than anything    Pertinent History Pt reports acute onset of L medial knee pain during a trip to [place], [place] upon which pt underwent excessive walking c gradually worseing symptomes. Pt has been checked out by PCP and ortho consult, and now presents for PT. Pt reports initital pain was intermittent in nature, stabing and burning. Pt denies acute trauma. Pt reports since injection into knee on 6/20, pain has constantly hurt c ache.  Pt reports knees feels fine upon waking but will hurt more with activity throughout day.    How long can you sit comfortably? 7/28- no limits    How long can you stand comfortably? 7/28- able to stand longer than an hour    How long can you walk comfortably? 7/28- at least 2 hours    Patient Stated Goals Reduce pain, improve function activity tolerance, and avoid surgery.  Currently in Pain? No/denies            [facility] PT Assessment - 03/20/15 0001    Observation/Other Assessments   Focus on Therapeutic Outcomes (FOTO)  10% limited    PROM   Left Knee Extension 5  hyperextension    Left Knee Flexion 158   Strength   Right Hip Flexion 4/5   Right Hip Extension 4-/5   Right Hip ABduction 3+/5   Left Hip Flexion 4/5   Left Hip Extension 4-/5   Left Hip ABduction 3/5   Right Knee Flexion 4/5   Right Knee Extension 4+/5   Left Knee Flexion 4-/5   Left Knee Extension 4+/5   Right Ankle Dorsiflexion 4+/5   Left Ankle Dorsiflexion 4+/5                     [facility] Adult PT Treatment/Exercise - 03/20/15 0001    Knee/Hip Exercises: Stretches   Active Hamstring Stretch 3 reps;30  seconds   Active Hamstring Stretch Limitations 12 inch step    Piriformis Stretch 3 reps;30 seconds   Piriformis Stretch Limitations seated    Gastroc Stretch 3 reps;30 seconds   Gastroc Stretch Limitations slantboard    Knee/Hip Exercises: Aerobic   Nustep seat 7 hills 3 level 3 for 10 minutes                PT Education - 03/20/15 1623    Education provided Yes   Education Details DC today    Person(s) Educated Patient   Methods Explanation   Comprehension Verbalized understanding          PT Short Term Goals - 03/20/15 1414    PT SHORT TERM GOAL #1   Title Pt will demonstrate ability to perform sitting without increase in pain for >2 hours to show return to plof.    Status Achieved   PT SHORT TERM GOAL #2   Title Pt will demonstrate ability to perform standing without increase in pain for >2 hours to show return to plof.    Status Achieved   PT SHORT TERM GOAL #3   Title Pt will demonstrate ability to perform walking without increase in pain for >2 hours to show return to plof.    Status Partially Met           PT Long Term Goals - 03/20/15 1417    PT LONG TERM GOAL #1   Title Pt will demonstrate ability to perform sitting without increase in pain for >3 hours to show return to plof.    Status Achieved   PT LONG TERM GOAL #2   Title Pt will demonstrate ability to perform standing without increase in pain for >4 hours to show return to plof.    Status Partially Met   PT LONG TERM GOAL #3   Title Pt will demonstrate ability to perform standing without increase in pain for >4 hours to show return to plof.    Status On-going               Plan - 03/20/15 1624    Clinical Impression Statement Discharge assesssment performed today. Patient does demonstrate some weakness in proximal musculature but otherwise is at the level of function that she is very content with. At this time patient is ready for discharge and is motivated to remain active at home. DC  today.    Pt will benefit from skilled therapeutic intervention in order to improve on the  following deficits Difficulty walking;Abnormal gait;Decreased activity tolerance;Hypermobility;Pain;Decreased strength;Increased edema   Rehab Potential Good   Clinical Impairments Affecting Rehab Potential bilat genu recurvatum; generalized congenital global hypermobility   PT Frequency 2x / week   PT Duration 8 weeks   PT Treatment/Interventions Balance training;Therapeutic exercise;Therapeutic activities;Manual techniques;Stair training;Gait training   PT Next Visit Plan DC today    PT Home Exercise Plan Quad sets, and functional squats; updated to include TrA activation in hook-lying as well as belly breathing.    Consulted and Agree with Plan of Care Patient        Problem List Patient Active Problem List   Diagnosis Date Noted  . HTN (hypertension) 04/08/2013  . Membranous glomerulonephritis 04/08/2013  . Hypothyroidism 04/08/2013  . GERD (gastroesophageal reflux disease) 04/08/2013  . OSA (obstructive sleep apnea) 04/08/2013  . Hyperlipidemia 04/08/2013  . DM2 (diabetes mellitus, type 2) 04/08/2013   PHYSICAL THERAPY DISCHARGE SUMMARY  Visits from Start of Care: 10  Current functional level related to goals / functional outcomes: Patient is able to do all she needs and wants to do functionally at this time and is pain free    Remaining deficits: Proximal muscle weakness   Education / Equipment: If patient would like to return to PT, will need new MD order  Plan: Patient agrees to discharge.  Patient goals were partially met. Patient is being discharged due to being pleased with the current functional level.  ?????       Deniece Ree PT, DPT [facility] [address] [place], [name], [address] Phone: [phone]   Fax:  [phone]

## 2015-05-27 ENCOUNTER — Ambulatory Visit (INDEPENDENT_AMBULATORY_CARE_PROVIDER_SITE_OTHER): Payer: 59 | Admitting: Cardiovascular Disease

## 2015-05-27 ENCOUNTER — Encounter: Payer: Self-pay | Admitting: Cardiovascular Disease

## 2015-05-27 VITALS — BP 126/84 | HR 99 | Ht 61.0 in | Wt 159.4 lb

## 2015-05-27 DIAGNOSIS — E118 Type 2 diabetes mellitus with unspecified complications: Secondary | ICD-10-CM | POA: Insufficient documentation

## 2015-05-27 DIAGNOSIS — G4733 Obstructive sleep apnea (adult) (pediatric): Secondary | ICD-10-CM

## 2015-05-27 DIAGNOSIS — N032 Chronic nephritic syndrome with diffuse membranous glomerulonephritis: Secondary | ICD-10-CM

## 2015-05-27 DIAGNOSIS — E039 Hypothyroidism, unspecified: Secondary | ICD-10-CM | POA: Diagnosis not present

## 2015-05-27 DIAGNOSIS — I10 Essential (primary) hypertension: Secondary | ICD-10-CM

## 2015-05-27 DIAGNOSIS — I25810 Atherosclerosis of coronary artery bypass graft(s) without angina pectoris: Secondary | ICD-10-CM

## 2015-05-27 DIAGNOSIS — N052 Unspecified nephritic syndrome with diffuse membranous glomerulonephritis: Secondary | ICD-10-CM

## 2015-05-27 DIAGNOSIS — E785 Hyperlipidemia, unspecified: Secondary | ICD-10-CM

## 2015-05-27 MED ORDER — METOPROLOL SUCCINATE ER 50 MG PO TB24: ORAL_TABLET | ORAL | Status: DC

## 2015-05-27 NOTE — Progress Notes (Signed)
Patient ID: [name], female   DOB: 02/10/1951, 64 y.o.   MRN: [id number]      HPI: [name] is a 64 y.o. female who presents for 28 month cardiology evaluation.  Ms. 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 Jimmy Footman. She also has a history of hypertension, hypothyroidism,  DM2, GERD as well as obstructive sleep apnea and has only  intermittently utilized CPAP therapy. Her OSA is moderate with an AHI of 20.9 but severe during REM sleep with an AHI of 54.3 which was noted on her initial sleep study in 2009.  She also has a history of hyperlipidemia and has been on combination therapy with fenofibrate and Crestor.  She underwent another kidney biopsy in October 2014.  This did not show any significant pathologic change.  She has been on direct renin inhibition and ARB therapy in attempt to reduce protein excretion.  She tells recently, her proteinuria has increased to 10 g.  She has a history of hypothyroidism and is on thyroid replacement.  She will be seen Dr. Suzette Battiest to establish endocrinologic care.  She has a history of hyperlipidemia and has been on combination Crestor and fenofibrate.  In addition to her tekturna and micardis.  She also has been taking Lasix 40 mg daily, Toprol-XL 50 mg and Cardizem 240 mg for additional blood pressure control.  I saw her last year, she was not using CPAP.  She again has started to use CPAP but admits that she has has not been using this for long enough duration per night.  She at times uses it only 1-4 hours and oftentimes does not put a bag on.  When she comes back from going to the bathroom.  She denies chest pain brought on with exertion.  At times she notes a vague chest sensation which is nonexertional.  She tells me she had was running out of her metoprolol succinate, which had been 50 mg and a prescription of metoprolol was cold and by her primary physician,  but unfortunately this was just metoprolol, tartrate, and she has been only taking this 50 mg daily.  She did not take her dose last night.  She presents to the office today for evaluation.  Past Medical History  Diagnosis Date  . Hypertension   . High cholesterol   . Heart murmur   . Type II diabetes mellitus ([place])   . Anginal pain ([place])     "often; usually when thyroid goes out of wack and heart starts fluttering" (06/18/2013)  . Dysrhythmia     "palpitation-type feelings; related to thyroid" (06/18/2013)  . Carotid stenosis ~ 01/2013    "bilaterally; ~ 50%; dx'd/carotid US" (06/18/2013)  . Hypothyroidism   . Pneumonia ?1980's; 09/2011    "twice" (06/18/2013)  . Exertional shortness of breath   . Sleep apnea     "have mask; haven't worn it in ~ 1 yr" (06/18/2013)  . Anemia     "as a kid" (06/18/2013)  . GERD (gastroesophageal reflux disease)   . CLEXNTZG(017.4)     "monthly" (06/18/2013)  . Arthritis     "joints probably" (101/27/2014)  . Gout   . Membranous glomerulonephritis   . Hepatitis, unspecified 1972    "husband had hepatitis; my mother, daughter, and me all had to get the gammaglobulin shots" (06/18/2013)    Past Surgical History  Procedure Laterality Date  . Cesarean section  1971; 1977  .  Shoulder arthroscopy w/ rotator cuff repair Right 2009  . Bunionectomy Bilateral [id number]    "once on each side" (06/18/2013)  . Cholecystectomy  ?1998  . Renal biopsy Left ~ 1997; 06/18/2013    Allergies  Allergen Reactions  . Codeine Nausea And Vomiting  . Ibuprofen Other (See Comments)    Per kidney MD.  . Nsaids Other (See Comments)    Per Kidney DR. No NSAIDS  . Shellfish Allergy Other (See Comments)    REACTION: Gout Flare-ups   . Septra Ds [Sulfamethoxazole-Trimethoprim] Itching and Rash    Current Outpatient Prescriptions  Medication Sig Dispense Refill  . allopurinol (ZYLOPRIM) 300 MG tablet Take 300 mg by mouth daily.    Marland Kitchen aspirin EC 325 MG tablet Take  325 mg by mouth daily.    . cetirizine (ZYRTEC) 10 MG tablet Take 10 mg by mouth at bedtime.    Marland Kitchen dexlansoprazole (DEXILANT) 60 MG capsule Take 60 mg by mouth every morning.     . diltiazem (CARDIZEM CD) 240 MG 24 hr capsule Take 240 mg by mouth at bedtime.    . fenofibrate (TRICOR) 145 MG tablet Take 145 mg by mouth at bedtime.     . furosemide (LASIX) 40 MG tablet Take 40 mg by mouth daily.    Marland Kitchen glimepiride (AMARYL) 2 MG tablet Take 2 mg by mouth 2 (two) times daily.    Marland Kitchen linagliptin (TRADJENTA) 5 MG TABS tablet Take 5 mg by mouth every morning.     . Multiple Vitamin (MULTIVITAMIN WITH MINERALS) TABS tablet Take 1 tablet by mouth daily.    . Nutritional Supplements (ESTROVEN) TABS Take 1 tablet by mouth at bedtime.     . Omega-3 Fatty Acids (FISH OIL) 1200 MG CAPS Take 2 capsules by mouth every morning.     Marland Kitchen rOPINIRole (REQUIP) 0.5 MG tablet Take 0.5 mg by mouth at bedtime.    . rosuvastatin (CRESTOR) 40 MG tablet Take 40 mg by mouth at bedtime.    Marland Kitchen SYNTHROID 200 MCG tablet Take 200 mcg by mouth daily.  0  . SYNTHROID 25 MCG tablet Take 25 mcg by mouth daily.  0  . TEKTURNA 150 MG tablet Take 1 tablet by mouth daily.  0  . telmisartan (MICARDIS) 80 MG tablet Take 160 mg by mouth at bedtime.     . metoprolol succinate (TOPROL-XL) 50 MG 24 hr tablet Take 1 & 1/2 tablet daily 45 tablet 6   No current facility-administered medications for this visit.    Social History   Social History  . Marital Status: Married    Spouse Name: N/A  . Number of Children: N/A  . Years of Education: N/A   Occupational History  . Not on file.   Social History Main Topics  . Smoking status: Never Smoker   . Smokeless tobacco: Never Used  . Alcohol Use: No  . Drug Use: No  . Sexual Activity: No   Other Topics Concern  . Not on file   Social History Narrative   Socially, she has just retired.  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  ROS General:  Negative; No fevers, chills, or night sweats;  HEENT: Negative; No changes in vision or hearing, sinus congestion, difficulty swallowing Pulmonary: Negative; No cough, wheezing, shortness of breath, hemoptysis Cardiovascular: Negative; No chest pain, presyncope, syncope, palpitations Positive for occasional edema GI: Negative; No nausea, vomiting, diarrhea, or abdominal pain GU: Negative; No dysuria, hematuria,  or difficulty voiding Renal: Positive for membranous glomerulonephropathy with nephrotic proteinuria Musculoskeletal: Positive for TMJ; no myalgias, joint pain, or weakness Hematologic/Oncology: Negative; no easy bruising, bleeding Endocrine: Negative; no heat/cold intolerance; no diabetes Neuro: Negative; no changes in balance, headaches Skin: Negative; No rashes or skin lesions Psychiatric: Negative; No behavioral problems, depression Sleep: Positive for OSA;  Other comprehensive 14 point system review is negative.  PE BP 126/84 mmHg  Pulse 99  Ht '5\' 1"'  (1.549 m)  Wt 159 lb 6.4 oz (72.303 kg)  BMI 30.13 kg/m2   Wt Readings from Last 3 Encounters:  05/27/15 159 lb 6.4 oz (72.303 kg)  03/25/14 163 lb 8 oz (74.163 kg)  06/18/13 165 lb (74.844 kg)   General: Alert, oriented, no distress.  Skin: normal turgor, no rashes HEENT: Normocephalic, atraumatic. Pupils round and reactive; sclera anicteric;no lid lag.  Nose without nasal septal hypertrophy Mouth/Parynx benign; Mallinpatti scale 3 Neck: No JVD, no carotid bruits with normal carotid Lungs: clear to ausculatation and percussion; no wheezing or rales Chest wall: Nontender to palpation Heart: RRR, s1 s2 normal 1/6 sem; no diastolic murmur, rubs, thrills or heaves. Abdomen: soft, nontender; no hepatosplenomehaly, BS+; abdominal aorta nontender and not dilated by palpation. Back: No CVA tenderness Pulses 2+ Extremities: Trace pretibial edema, no clubbing cyanosis, Homan's sign negative  Neurologic: grossly  nonfocal Psychological: Normal affect and mood  ECG (independently read by me): Sinus rhythm at 99 bpm.  Left axis deviation.  Poor progression anteriorly.  August 2015 ECG (independently read by me): Normal sinus rhythm at 70 beats per minute.  QTc interval 423 ms.  PR interval normal at 150 ms.  Prior ECG: Normal sinus rhythm at 92 beats per minute. Poor R wave progression.  LABS: BMP Latest Ref Rng 08/21/2014 06/18/2013 10/29/2012  Glucose 70 - 99 mg/dL - 125(H) 195(H)  BUN 6 - 23 mg/dL - 24(H) 14  Creatinine 0.50 - 1.10 mg/dL 1.30(H) 1.08 1.06  Sodium 135 - 145 mEq/L - 139 140  Potassium 3.5 - 5.1 mEq/L - 4.2 4.1  Chloride 96 - 112 mEq/L - 104 103  CO2 19 - 32 mEq/L - 25 25  Calcium 8.4 - 10.5 mg/dL - 10.3 10.4   Hepatic Function Latest Ref Rng 11/07/2011  Total Protein 6.0 - 8.3 g/dL 6.6  Albumin 3.5 - 5.2 g/dL 3.3(L)  AST 0 - 37 U/L 36  ALT 0 - 35 U/L 52(H)  Alk Phosphatase 39 - 117 U/L 43  Total Bilirubin 0.3 - 1.2 mg/dL 0.3    CBC Latest Ref Rng 06/19/2013 06/18/2013 06/18/2013  WBC 4.0 - 10.5 K/uL 10.6(H) 9.4 9.5  Hemoglobin 12.0 - 15.0 g/dL 11.6(L) 11.6(L) 12.7  Hematocrit 36.0 - 46.0 % 33.9(L) 34.1(L) 36.9  Platelets 150 - 400 K/uL 279 293 306   . Lab Results  Component Value Date   MCV 92.6 06/19/2013   MCV 92.4 06/18/2013   MCV 92.3 06/18/2013   No results found for: TSH No results found for: HGBA1C   Lipid Panel  No results found for: CHOL, TRIG, HDL, CHOLHDL, VLDL, LDLCALC, LDLDIRECT   RADIOLOGY: No results found.    ASSESSMENT AND PLAN: Ms Darsey is a 64 year old female who has a history of hypertension, hypothyroidism, GERD, obstructive sleep apnea as well as her nephrotic range proteinuria due to membranous glomerulonephritis.  She has a resting tachycardia today, which I suspect may be due to the inadvertant change from metoprolol succinate to metoprolol tartrate, and she had missed a  dose yesterday.  She has had issues with fast heartbeat in the  past.  She also was on high-dose Synthroid replacement at 225 g for her thyroid disease and will be establishing endocrinologic care in the near future with Dr. Suzette Battiest.  I am changing her metoprolol back to metoprolol succinate and will increase this to 75 mg daily.  This should continue to allow for good blood pressure control as well as heart rate.  I had a long discussion with her concerning the importance of using her CPAP all night.  I discussed with her that the preponderance of rim sleep occurs in the latter half at night and her sleep apnea is markedly abnormal, particularly in REM sleep with an AHI of 54.3.  If she has only been using her CPAP therapy for short duration when she needs it the most .  She has not been utilizing this therapy.  We discussed ways so that she will put the mask on which his nasal pillows and should not be difficult.  She seems have a better understanding as to the importance of this.  She is not having any anginal type chest pain.  She is diabetic on amaryl and tradjenta.  Target LDL is less than 70 in this diabetic female and she is on Crestor 40 mg, omega-3 fatty acids, and fenofibrate.  I will see her in 6 months for cardiology reevaluation  Troy Sine, MD, [facility]  05/27/2015 6:10 PM

## 2015-05-27 NOTE — Patient Instructions (Signed)
Your physician has recommended you make the following change in your medication: the lopressor has been changed to metoprolol succ ER 50 mg tablets. Take as directed on the bottle.  Your physician wants you to follow-up in: 6 months or sooner if needed. You will receive a reminder letter in the mail two months in advance. If you don't receive a letter, please call our office to schedule the follow-up appointment.

## 2015-07-10 ENCOUNTER — Encounter: Payer: Self-pay | Admitting: *Deleted

## 2015-07-10 ENCOUNTER — Encounter: Payer: 59 | Attending: Endocrinology | Admitting: *Deleted

## 2015-07-10 DIAGNOSIS — Z713 Dietary counseling and surveillance: Secondary | ICD-10-CM | POA: Diagnosis not present

## 2015-07-10 DIAGNOSIS — E1121 Type 2 diabetes mellitus with diabetic nephropathy: Secondary | ICD-10-CM | POA: Diagnosis not present

## 2015-07-10 DIAGNOSIS — Z794 Long term (current) use of insulin: Secondary | ICD-10-CM | POA: Diagnosis not present

## 2015-07-10 NOTE — Patient Instructions (Signed)
Plan:  Aim for 2 Carb Choices per meal (30 grams) +/- 1 either way  Aim for 0-1 Carbs per snack if hungry  Include protein in moderation with your meals and snacks Consider reading food labels for Total Carbohydrate of foods Consider checking BG at alternate times per day if directed by MD  Continue taking medication insulin as directed by MD

## 2015-07-10 NOTE — Progress Notes (Signed)
Diabetes Self-Management Education  Visit Type: First/Initial  Appt. Start Time: 0930 Appt. End Time: 1100  07/10/2015  Ms. [name], identified by name and date of birth, is a 64 y.o. female with a diagnosis of Diabetes: Type 2.   ASSESSMENT  There were no vitals taken for this visit. There is no weight on file to calculate BMI.      Diabetes Self-Management Education - 07/10/15 0938    Visit Information   Visit Type First/Initial   Initial Visit   Diabetes Type Type 2   Are you currently following a meal plan? No   Are you taking your medications as prescribed? Yes   Date Diagnosed 2012   Health Coping   How would you rate your overall health? Fair   Psychosocial Assessment   Patient Belief/Attitude about Diabetes Motivated to manage diabetes   Self-management support Family;Doctor's office;Internet communities   Other persons present Patient;Spouse/SO   Patient Concerns Nutrition/Meal planning   Preferred Learning Style Auditory;Hands on   How often do you need to have someone help you when you read instructions, pamphlets, or other written materials from your doctor or pharmacy? 1 - Never   What is the last grade level you completed in school? 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 3-4 times/day   Fasting Blood glucose range (mg/dL) 70-129   Postprandial Blood glucose range (mg/dL) 130-179   Number of hypoglycemic episodes per month 0   Have you had a dilated eye exam in the past 12 months? Yes   Have you had a dental exam in the past 12 months? Yes   Are you checking your feet? Yes   How many days per week are you checking your feet? 3   Dietary Intake   Breakfast she used to skip breakfast until about 11 AM. Now that she's on insulin, she is eating bacon on lettuce leaf wtih a tomato   Snack (morning) cheese and a yogourt, pickles    Lunch lunch meat on lettuce again with  tomato   Snack (afternoon) same as AM snack   Dinner meat, green vegetables, avoiding starches right now   Snack (evening) yogurt or cheese or pickle   Beverage(s) coffee with sugar free creamer, water, Crystal light   Exercise   Exercise Type ADL's   How many days per week to you exercise? 0   How many minutes per day do you exercise? 0   Total minutes per week of exercise 0   Patient Education   Previous Diabetes Education Yes (please comment)  2014 and 2015 at [place] [facility]  Definition of diabetes, type 1 and 2, and the diagnosis of diabetes   Nutrition management  Role of diet in the treatment of diabetes and the relationship between the three main macronutrients and blood glucose level;Food label reading, portion sizes and measuring food.;Carbohydrate counting   Monitoring Purpose and frequency of SMBG.;Identified appropriate SMBG and/or A1C goals.   Individualized Goals (developed by patient)   Nutrition Follow meal plan discussed   Outcomes   Expected Outcomes Demonstrated interest in learning. Expect positive outcomes   Future DMSE PRN   Program Status Completed      Individualized Plan for Diabetes Self-Management Training:   Learning Objective:  Patient will have a greater understanding of diabetes self-management. Patient education plan is to attend individual and/or group sessions  per assessed needs and concerns.   Plan:   Patient Instructions  Plan:  Aim for 2 Carb Choices per meal (30 grams) +/- 1 either way  Aim for 0-1 Carbs per snack if hungry  Include protein in moderation with your meals and snacks Consider reading food labels for Total Carbohydrate of foods Consider checking BG at alternate times per day if directed by MD  Continue taking medication insulin as directed by MD       Expected Outcomes:  Demonstrated interest in learning. Expect positive outcomes  Education material provided: Living Well with Diabetes, A1C conversion sheet,  Meal plan card and Carbohydrate counting sheet  If problems or questions, patient to contact team via:  Phone and Email  Future DSME appointment: PRN

## 2015-09-11 ENCOUNTER — Other Ambulatory Visit (HOSPITAL_COMMUNITY): Payer: Self-pay | Admitting: Obstetrics and Gynecology

## 2015-09-11 DIAGNOSIS — Z1231 Encounter for screening mammogram for malignant neoplasm of breast: Secondary | ICD-10-CM

## 2015-10-06 ENCOUNTER — Ambulatory Visit (HOSPITAL_COMMUNITY)
Admission: RE | Admit: 2015-10-06 | Discharge: 2015-10-06 | Disposition: A | Discharge status: Home / Self Care | Payer: 59 | Source: Ambulatory Visit | Attending: Obstetrics and Gynecology | Admitting: Obstetrics and Gynecology

## 2015-10-06 DIAGNOSIS — Z1231 Encounter for screening mammogram for malignant neoplasm of breast: Secondary | ICD-10-CM | POA: Diagnosis not present

## 2015-10-28 ENCOUNTER — Telehealth: Payer: Self-pay

## 2015-10-28 NOTE — Telephone Encounter (Signed)
Pt received a letter from DS to be triage. Please call her at [phone]

## 2015-10-28 NOTE — Telephone Encounter (Signed)
Tried to call pt. Bad connection.  

## 2015-10-28 NOTE — Telephone Encounter (Signed)
Pt called back. She was unable to get to the phone before answering machine picked up.

## 2015-11-03 NOTE — Telephone Encounter (Signed)
Faxed Medical Records, pt said she had a colonoscopy with Dr. Arnoldo Morale in the last 10 years.

## 2015-11-06 NOTE — Telephone Encounter (Signed)
Gastroenterology Pre-Procedure Review  Request Date: 11/06/2015 Requesting Physician: Delman Cheadle, PA  PATIENT REVIEW QUESTIONS: The patient responded to the following health history questions as indicated:    Pt said she occasionally has palpitations  Routing to Walden Field, NP to advise about OV prior to procedure  1. Diabetes Melitis: YES 2. Joint replacements in the past 12 months: no 3. Major health problems in the past 3 months: no 4. Has an artificial valve or MVP: no 5. Has a defibrillator: no 6. Has been advised in past to take antibiotics in advance of a procedure like teeth cleaning: no 7. Family history of colon cancer: YES   Her mom's sister in her 13's 8. Alcohol Use: no 9. History of sleep apnea: YES    But she just does not sleep with the C-pap/ got out of the habit   MEDICATIONS & ALLERGIES:    Patient reports the following regarding taking any blood thinners:   Plavix? no Aspirin? YES Coumadin? no  Patient confirms/reports the following medications:  Current Outpatient Prescriptions  Medication Sig Dispense Refill  . allopurinol (ZYLOPRIM) 300 MG tablet Take 300 mg by mouth daily.    Marland Kitchen aspirin 81 MG tablet Take 81 mg by mouth daily. Takes 2 of the 81 mg tablets daily    . cetirizine (ZYRTEC) 10 MG tablet Take 10 mg by mouth at bedtime.    . Corticotropin (ACTHAR HP IJ) Inject as directed. Takes the injections every Tuesday and Friday    . dexlansoprazole (DEXILANT) 60 MG capsule Take 60 mg by mouth every morning.     . diltiazem (CARDIZEM CD) 240 MG 24 hr capsule Take 240 mg by mouth at bedtime.    . fenofibrate (TRICOR) 145 MG tablet Take 145 mg by mouth at bedtime.     . furosemide (LASIX) 40 MG tablet Take 40 mg by mouth daily.    . insulin aspart (NOVOLOG) 100 UNIT/ML injection Inject 3 Units into the skin 3 (three) times daily before meals. On sliding scale    . insulin glargine (LANTUS) 100 UNIT/ML injection Inject 15 Units into the skin at bedtime.     . metoprolol succinate (TOPROL-XL) 50 MG 24 hr tablet Take 1 & 1/2 tablet daily 45 tablet 6  . NON FORMULARY     . rOPINIRole (REQUIP) 0.5 MG tablet Take 0.5 mg by mouth at bedtime. Takes 2 tablets at bedtime    . rosuvastatin (CRESTOR) 40 MG tablet Take 40 mg by mouth at bedtime.    Marland Kitchen SYNTHROID 200 MCG tablet Take 225 mcg by mouth daily.   0  . SYNTHROID 25 MCG tablet Take 25 mcg by mouth daily.  0  . TEKTURNA 150 MG tablet Take 1 tablet by mouth daily.  0  . telmisartan (MICARDIS) 80 MG tablet Take 160 mg by mouth at bedtime.     Marland Kitchen aspirin EC 325 MG tablet Take 325 mg by mouth daily. Reported on 10/28/2015    . glimepiride (AMARYL) 2 MG tablet Take 2 mg by mouth 2 (two) times daily. Reported on 10/28/2015    . linagliptin (TRADJENTA) 5 MG TABS tablet Take 5 mg by mouth every morning. Reported on 10/28/2015    . Misc Natural Products (ESTROVEN ENERGY PO) Take by mouth. Reported on 10/28/2015    . Multiple Vitamin (MULTIVITAMIN WITH MINERALS) TABS tablet Take 1 tablet by mouth daily. Reported on 10/28/2015    . Nutritional Supplements (ESTROVEN) TABS Take 1 tablet by mouth at bedtime.  Reported on 10/28/2015    . Omega-3 Fatty Acids (FISH OIL) 1200 MG CAPS Take 2 capsules by mouth every morning. Reported on 10/28/2015     No current facility-administered medications for this visit.    Patient confirms/reports the following allergies:  Allergies  Allergen Reactions  . Codeine Nausea And Vomiting  . Ibuprofen Other (See Comments)    Per kidney MD.  . Nsaids Other (See Comments)    Per Kidney DR. No NSAIDS  . Shellfish Allergy Other (See Comments)    REACTION: Gout Flare-ups   . Septra Ds [Sulfamethoxazole-Trimethoprim] Itching and Rash    No orders of the defined types were placed in this encounter.    AUTHORIZATION INFORMATION Primary Insurance:  ID #: Group #:  Pre-Cert / Auth required:  Pre-Cert / Auth #:   Secondary Insurance:   ID #: Group #:  Pre-Cert / Auth required:  Pre-Cert /  Auth #:   SCHEDULE INFORMATION: Procedure has been scheduled as follows:  Date:                 Time:   Location:   This Gastroenterology Pre-Precedure Review Form is being routed to the following provider(s):

## 2015-11-06 NOTE — Telephone Encounter (Signed)
Last colonoscopy report was 07/07/2006 by dr. Arnoldo Morale and he recommended the next in 10 years.

## 2015-11-06 NOTE — Addendum Note (Signed)
Addended by: Everardo All on: 11/06/2015 11:39 AM   Modules accepted: Medications

## 2015-11-06 NOTE — Addendum Note (Signed)
Addended by: Everardo All on: 11/06/2015 11:14 AM   Modules accepted: Medications

## 2015-11-06 NOTE — Telephone Encounter (Signed)
Pt has family hx of colon cancer in her mom's sister in her 58's. [place] for colonoscopy for family hx of colon cancer. She said previously she was getting them 3-5 years, but Dr. Arnoldo Morale said she could wait 10 years. She would like to do now. Sending triage to Walden Field, NP to see if OV is recommended prior to colonoscopy due to her kidney problems.

## 2015-11-06 NOTE — Telephone Encounter (Signed)
Pt to check her insurance and see if it will pay before 06/2016. She will let me know.

## 2015-11-07 NOTE — Telephone Encounter (Signed)
Ok to schedule.

## 2015-11-10 ENCOUNTER — Other Ambulatory Visit: Payer: Self-pay

## 2015-11-10 DIAGNOSIS — Z1211 Encounter for screening for malignant neoplasm of colon: Secondary | ICD-10-CM

## 2015-11-10 MED ORDER — PEG 3350-KCL-NA BICARB-NACL 420 G PO SOLR: 4000.0000 mL | ORAL | Status: DC

## 2015-11-10 NOTE — Telephone Encounter (Signed)
Discard first instructions. New instructions to include DM meds instructions.

## 2015-11-10 NOTE — Telephone Encounter (Signed)
All DM medications: 1/2 dose night before, none morning of.

## 2015-11-10 NOTE — Telephone Encounter (Signed)
Eric, please advise on her insulin. Thanks!

## 2015-11-10 NOTE — Addendum Note (Signed)
Addended by: Everardo All on: 11/10/2015 04:00 PM   Modules accepted: Orders

## 2015-11-10 NOTE — Telephone Encounter (Signed)
Rx sent to the pharmacy and instructions mailed to pt.  

## 2015-11-26 ENCOUNTER — Telehealth: Payer: Self-pay

## 2015-11-26 NOTE — Telephone Encounter (Signed)
I called [employer] at [phone] and spoke to Lex T who said a PA is required for the screening colonoscopy as out patient. Reference # [id number], process started.

## 2015-11-27 NOTE — Telephone Encounter (Signed)
VM from [place] with [facility] stating that the PA has been approved. Ref# [id number].

## 2015-11-30 ENCOUNTER — Emergency Department (HOSPITAL_COMMUNITY)
Admission: EM | Admit: 2015-11-30 | Discharge: 2015-11-30 | Disposition: A | Discharge status: Home / Self Care | Payer: 59 | Attending: Emergency Medicine | Admitting: Emergency Medicine

## 2015-11-30 ENCOUNTER — Encounter (HOSPITAL_COMMUNITY): Payer: Self-pay | Admitting: Emergency Medicine

## 2015-11-30 ENCOUNTER — Emergency Department (HOSPITAL_COMMUNITY): Payer: 59

## 2015-11-30 DIAGNOSIS — Z862 Personal history of diseases of the blood and blood-forming organs and certain disorders involving the immune mechanism: Secondary | ICD-10-CM | POA: Insufficient documentation

## 2015-11-30 DIAGNOSIS — W01198A Fall on same level from slipping, tripping and stumbling with subsequent striking against other object, initial encounter: Secondary | ICD-10-CM | POA: Insufficient documentation

## 2015-11-30 DIAGNOSIS — E119 Type 2 diabetes mellitus without complications: Secondary | ICD-10-CM | POA: Insufficient documentation

## 2015-11-30 DIAGNOSIS — Y9289 Other specified places as the place of occurrence of the external cause: Secondary | ICD-10-CM | POA: Insufficient documentation

## 2015-11-30 DIAGNOSIS — S20212A Contusion of left front wall of thorax, initial encounter: Secondary | ICD-10-CM | POA: Insufficient documentation

## 2015-11-30 DIAGNOSIS — M1990 Unspecified osteoarthritis, unspecified site: Secondary | ICD-10-CM | POA: Diagnosis not present

## 2015-11-30 DIAGNOSIS — Y998 Other external cause status: Secondary | ICD-10-CM | POA: Insufficient documentation

## 2015-11-30 DIAGNOSIS — Z7984 Long term (current) use of oral hypoglycemic drugs: Secondary | ICD-10-CM | POA: Diagnosis not present

## 2015-11-30 DIAGNOSIS — Z794 Long term (current) use of insulin: Secondary | ICD-10-CM | POA: Insufficient documentation

## 2015-11-30 DIAGNOSIS — Y9389 Activity, other specified: Secondary | ICD-10-CM | POA: Insufficient documentation

## 2015-11-30 DIAGNOSIS — Z79899 Other long term (current) drug therapy: Secondary | ICD-10-CM | POA: Insufficient documentation

## 2015-11-30 DIAGNOSIS — S63502A Unspecified sprain of left wrist, initial encounter: Secondary | ICD-10-CM | POA: Diagnosis not present

## 2015-11-30 DIAGNOSIS — R011 Cardiac murmur, unspecified: Secondary | ICD-10-CM | POA: Insufficient documentation

## 2015-11-30 DIAGNOSIS — Z7982 Long term (current) use of aspirin: Secondary | ICD-10-CM | POA: Diagnosis not present

## 2015-11-30 DIAGNOSIS — E039 Hypothyroidism, unspecified: Secondary | ICD-10-CM | POA: Insufficient documentation

## 2015-11-30 DIAGNOSIS — Z8669 Personal history of other diseases of the nervous system and sense organs: Secondary | ICD-10-CM | POA: Diagnosis not present

## 2015-11-30 DIAGNOSIS — Z87448 Personal history of other diseases of urinary system: Secondary | ICD-10-CM | POA: Insufficient documentation

## 2015-11-30 DIAGNOSIS — I10 Essential (primary) hypertension: Secondary | ICD-10-CM | POA: Insufficient documentation

## 2015-11-30 DIAGNOSIS — S6992XA Unspecified injury of left wrist, hand and finger(s), initial encounter: Secondary | ICD-10-CM | POA: Diagnosis present

## 2015-11-30 DIAGNOSIS — M109 Gout, unspecified: Secondary | ICD-10-CM | POA: Diagnosis not present

## 2015-11-30 DIAGNOSIS — E7800 Pure hypercholesterolemia, unspecified: Secondary | ICD-10-CM | POA: Insufficient documentation

## 2015-11-30 DIAGNOSIS — Z8701 Personal history of pneumonia (recurrent): Secondary | ICD-10-CM | POA: Insufficient documentation

## 2015-11-30 DIAGNOSIS — S8002XA Contusion of left knee, initial encounter: Secondary | ICD-10-CM

## 2015-11-30 DIAGNOSIS — I499 Cardiac arrhythmia, unspecified: Secondary | ICD-10-CM | POA: Insufficient documentation

## 2015-11-30 DIAGNOSIS — K219 Gastro-esophageal reflux disease without esophagitis: Secondary | ICD-10-CM | POA: Diagnosis not present

## 2015-11-30 NOTE — ED Notes (Signed)
Pt fell while outside on Friday. Pt states she tripped. Denies LOC. States she hit her face and broke her glasses. Pt c/o L wrist pain and L knee pain. Bruising noted to both. + pulse and sensation noted.

## 2015-11-30 NOTE — ED Provider Notes (Signed)
CSN: [id number]     Arrival date & time 11/30/15  [id number] History   First MD Initiated Contact with Patient 11/30/15 [phone]     Chief Complaint  Patient presents with  . Fall     (Consider location/radiation/quality/duration/timing/severity/associated sxs/prior Treatment) HPI Comments: Patient presents with wrist and knee pain after a fall. She states 2 days ago she tripped on a piece of concrete and fell onto her left side. She has pain and swelling to her left knee and pain and swelling to her left wrist. She states she hit her glasses during the fall and her glasses broke but she denies any other head injury. She's not been having any headaches since the fall. No dizziness. No nausea or vomiting. No neck or back pain. She is not on anticoagulants. She denies any other injuries from the fall other than she has some soreness in her left ribs. She denies any shortness of breath. On Friday after it happened, she did take one of her husband's pain pills which did seem to help with the pain. She states her tetanus shot is up-to-date.  Patient is a 65 y.o. female presenting with fall.  Fall Pertinent negatives include no chest pain, no abdominal pain, no headaches and no shortness of breath.    Past Medical History  Diagnosis Date  . Hypertension   . High cholesterol   . Heart murmur   . Type II diabetes mellitus ([place])   . Anginal pain ([place])     "often; usually when thyroid goes out of wack and heart starts fluttering" (06/18/2013)  . Dysrhythmia     "palpitation-type feelings; related to thyroid" (06/18/2013)  . Carotid stenosis ~ 01/2013    "bilaterally; ~ 50%; dx'd/carotid US" (06/18/2013)  . Hypothyroidism   . Pneumonia ?1980's; 09/2011    "twice" (06/18/2013)  . Exertional shortness of breath   . Sleep apnea     "have mask; haven't worn it in ~ 1 yr" (06/18/2013)  . Anemia     "as a kid" (06/18/2013)  . GERD (gastroesophageal reflux disease)   . [id number])     "monthly"  (06/18/2013)  . Arthritis     "joints probably" (101/27/2014)  . Gout   . Membranous glomerulonephritis   . Hepatitis, unspecified 1972    "husband had hepatitis; my mother, daughter, and me all had to get the gammaglobulin shots" (06/18/2013)   Past Surgical History  Procedure Laterality Date  . Cesarean section  1971; 1977  . Shoulder arthroscopy w/ rotator cuff repair Right 2009  . Bunionectomy Bilateral [id number]    "once on each side" (06/18/2013)  . Cholecystectomy  ?1998  . Renal biopsy Left ~ 1997; 06/18/2013   Family History  Problem Relation Age of Onset  . Cirrhosis Mother 36    Non-alcoholic related  . Coronary artery disease Father    Social History  Substance Use Topics  . Smoking status: Never Smoker   . Smokeless tobacco: Never Used  . Alcohol Use: No   OB History    No data available     Review of Systems  Constitutional: Negative for fever, chills, diaphoresis and fatigue.  HENT: Negative for congestion, rhinorrhea and sneezing.   Eyes: Negative.   Respiratory: Negative for cough, chest tightness and shortness of breath.   Cardiovascular: Negative for chest pain and leg swelling.  Gastrointestinal: Negative for nausea, vomiting, abdominal pain, diarrhea and blood in stool.  Genitourinary: Negative for frequency, hematuria, flank pain and difficulty urinating.  Musculoskeletal:  Positive for joint swelling and arthralgias. Negative for back pain and neck pain.  Skin: Positive for wound. Negative for rash.  Neurological: Negative for dizziness, speech difficulty, weakness, numbness and headaches.      Allergies  Codeine; Ibuprofen; Nsaids; Shellfish allergy; and Septra ds  Home Medications   Prior to Admission medications   Medication Sig Start Date End Date Taking? Authorizing Provider  allopurinol (ZYLOPRIM) 300 MG tablet Take 300 mg by mouth daily.   Yes Historical Provider, MD  amoxicillin-clavulanate (AUGMENTIN) 875-125 MG tablet Take 1  tablet by mouth 2 (two) times daily. 10 day regimen started 4/6 11/27/15  Yes Historical Provider, MD  aspirin 81 MG tablet Take 162 mg by mouth daily.    Yes Historical Provider, MD  cetirizine (ZYRTEC) 10 MG tablet Take 10 mg by mouth at bedtime.   Yes Historical Provider, MD  Corticotropin (ACTHAR HP IJ) Inject 80 Units as directed See admin instructions. Takes the injections every Tuesday and Friday   Yes Historical Provider, MD  dexlansoprazole (DEXILANT) 60 MG capsule Take 60 mg by mouth every morning.    Yes Historical Provider, MD  diltiazem (CARDIZEM CD) 240 MG 24 hr capsule Take 240 mg by mouth at bedtime.   Yes Historical Provider, MD  fenofibrate (TRICOR) 145 MG tablet Take 145 mg by mouth at bedtime.    Yes Historical Provider, MD  furosemide (LASIX) 40 MG tablet Take 40 mg by mouth daily.   Yes Historical Provider, MD  insulin aspart (NOVOLOG) 100 UNIT/ML injection Inject 3-8 Units into the skin 3 (three) times daily before meals. On sliding scale   Yes Historical Provider, MD  insulin glargine (LANTUS) 100 UNIT/ML injection Inject 15 Units into the skin at bedtime.   Yes Historical Provider, MD  linagliptin (TRADJENTA) 5 MG TABS tablet Take 5 mg by mouth every morning. Reported on 10/28/2015   Yes Historical Provider, MD  metoprolol succinate (TOPROL-XL) 50 MG 24 hr tablet Take 1 & 1/2 tablet daily Patient taking differently: Take 75 mg by mouth daily.  05/27/15  Yes Troy Sine, MD  Multiple Vitamin (MULTIVITAMIN WITH MINERALS) TABS tablet Take 1 tablet by mouth daily. Reported on 10/28/2015   Yes Historical Provider, MD  Nutritional Supplements (ESTROVEN) TABS Take 1 tablet by mouth at bedtime. Reported on 10/28/2015   Yes Historical Provider, MD  Omega-3 Fatty Acids (FISH OIL) 1200 MG CAPS Take 2 capsules by mouth every morning. Reported on 10/28/2015   Yes Historical Provider, MD  rOPINIRole (REQUIP) 0.5 MG tablet Take 1 mg by mouth at bedtime.    Yes Historical Provider, MD  rosuvastatin  (CRESTOR) 40 MG tablet Take 40 mg by mouth at bedtime.   Yes Historical Provider, MD  SYNTHROID 200 MCG tablet Take 225 mcg by mouth daily.  05/15/15  Yes Historical Provider, MD  SYNTHROID 25 MCG tablet Take 25 mcg by mouth daily. 05/25/15  Yes Historical Provider, MD  TEKTURNA 150 MG tablet Take 1 tablet by mouth daily. 05/16/15  Yes Historical Provider, MD  telmisartan (MICARDIS) 80 MG tablet Take 160 mg by mouth at bedtime.    Yes Historical Provider, MD   BP 164/75 mmHg  Pulse 74  Temp(Src) 98.7 F (37.1 C) (Oral)  Resp 16  Ht 5\' 1"  (1.549 m)  Wt 151 lb (68.493 kg)  BMI 28.55 kg/m2  SpO2 96% Physical Exam  Constitutional: She is oriented to person, place, and time. She appears well-developed and well-nourished.  HENT:  Head: Normocephalic and  atraumatic.  She has a small abrasion over her left maxilla. There is no bony tenderness or swelling to the face. There is no malocclusion.  Eyes: Pupils are equal, round, and reactive to light.  Neck: Normal range of motion. Neck supple.  No pain to the cervical thoracic or lumbosacral spine  Cardiovascular: Normal rate, regular rhythm and normal heart sounds.   Pulmonary/Chest: Effort normal and breath sounds normal. No respiratory distress. She has no wheezes. She has no rales. She exhibits tenderness (Positive tenderness to the left mid ribs. No crepitus or deformity. No signs of external trauma to the chest or abdomen.).  Abdominal: Soft. Bowel sounds are normal. There is no tenderness. There is no rebound and no guarding.  Musculoskeletal: Normal range of motion. She exhibits edema.  Patient has some pain and mild swelling to the left wrist over the distal radius. There is no bony tenderness to the hand. No pain to the elbow. No pain to the remainder of the forearm. Radial pulses are intact. She has normal sensation and motor function in the hand. She has some ecchymosis and swelling to her left knee. There is tenderness over the proximal  tibia. There is no pain to the hip. Pedal pulses are intact. She has normal sensation and motor function distally. There is no other pain on palpation or range of motion of the extremities. There is some mild ecchymosis over the right knee but there is no underlying bony tenderness.  Lymphadenopathy:    She has no cervical adenopathy.  Neurological: She is alert and oriented to person, place, and time.  Skin: Skin is warm and dry. No rash noted.  Psychiatric: She has a normal mood and affect.    ED Course  Procedures (including critical care time) Labs Review Labs Reviewed - No data to display  Imaging Review Dg Ribs Unilateral W/chest Left  11/30/2015  CLINICAL DATA:  65 year old who tripped and fell while outside 2 days ago, patient presents with left rib pain, left anterior knee pain and bruising, and left wrist pain and swelling. Initial encounter. EXAM: LEFT RIBS AND CHEST - 3+ VIEW COMPARISON:  CTs CT chest 08/21/2014 and earlier. Chest x-rays 05/24/2014 and earlier. No prior left rib imaging. FINDINGS: No fractures identified involving the left ribs. Osseous demineralization. Cardiac silhouette normal in size, unchanged. Thoracic aorta mildly atherosclerotic, unchanged. Hilar and mediastinal contours otherwise unremarkable. Linear scarring in the lingula. Lungs otherwise clear. Bronchovascular markings normal. No localized airspace consolidation. No pleural effusions. No pneumothorax. Normal pulmonary vascularity. IMPRESSION: 1. No left rib fracture identified. 2.  No acute cardiopulmonary disease. Electronically Signed   By: Evangeline Dakin M.D.   On: 11/30/2015 10:12   Dg Wrist Complete Left  11/30/2015  CLINICAL DATA:  65 year old who tripped and fell while outside 2 days ago, patient presents with left rib pain, left anterior knee pain and bruising, and left wrist pain and swelling. Initial encounter. EXAM: LEFT WRIST - COMPLETE 3+ VIEW COMPARISON:  None. FINDINGS: Osseous  demineralization. No evidence of acute fracture or dislocation. Severe narrowing of the trapezium-1st metacarpal joint space with associated hypertrophic changes and likely calcified loose bodies in the joint. Narrowing of the scaphotrapezium and scaphotrapezoid joint spaces. IMPRESSION: 1. No acute osseous abnormality. 2. Severe osteoarthritis. 3. Osseous demineralization. Electronically Signed   By: Evangeline Dakin M.D.   On: 11/30/2015 10:08   Dg Knee Complete 4 Views Left  11/30/2015  CLINICAL DATA:  65 year old who tripped and fell while outside 2 days  ago, patient presents with left rib pain, left anterior knee pain and bruising, and left wrist pain and swelling. Initial encounter. EXAM: LEFT KNEE - COMPLETE 4+ VIEW COMPARISON:  02/11/2011. FINDINGS: No evidence of acute fracture or dislocation. Moderate medial compartment joint space narrowing, unchanged. Lateral joint space well preserved. Mild patellofemoral compartment joint space narrowing and associated minimal spurring along the undersurface of the patella, unchanged. No visible joint effusion. IMPRESSION: 1. No acute osseous abnormality. 2. Stable moderate medial compartment osteoarthritis and mild patellofemoral compartment osteoarthritis. Electronically Signed   By: Evangeline Dakin M.D.   On: 11/30/2015 10:16   I have personally reviewed and evaluated these images and lab results as part of my medical decision-making.   EKG Interpretation None      MDM   Final diagnoses:  Wrist sprain, left, initial encounter  Chest wall contusion, left, initial encounter  Knee contusion, left, initial encounter    Patient has no evidence of fractures. No evidence of pneumothorax or rib fracture. She has no abdominal pain. She has no evidence of a head injury and since this is been 2 days since he incident, I didn't feel that she needed CT imaging of her head. She has no spinal tenderness. She was placed in a left wrist splint. She will take  Tylenol home for symptomatic relief. She denied the need for any stronger pain medication. She is able to ambulate and does not need crutches. She was advised to follow-up with her PCP if her symptoms continue or return here as needed for any worsening symptoms.    Malvin Johns, MD 11/30/15 1052

## 2015-11-30 NOTE — Discharge Instructions (Signed)
Blunt Chest Trauma Blunt chest trauma is an injury caused by a blow to the chest. These chest injuries can be very painful. Blunt chest trauma often results in bruised or broken (fractured) ribs. Most cases of bruised and fractured ribs from blunt chest traumas get better after 1 to 3 weeks of rest and pain medicine. Often, the soft tissue in the chest wall is also injured, causing pain and bruising. Internal organs, such as the heart and lungs, may also be injured. Blunt chest trauma can lead to serious medical problems. This injury requires immediate medical care. CAUSES   Motor vehicle collisions.  Falls.  Physical violence.  Sports injuries. SYMPTOMS   Chest pain. The pain may be worse when you move or breathe deeply.  Shortness of breath.  Lightheadedness.  Bruising.  Tenderness.  Swelling. DIAGNOSIS  Your caregiver will do a physical exam. X-rays may be taken to look for fractures. However, minor rib fractures may not show up on X-rays until a few days after the injury. If a more serious injury is suspected, further imaging tests may be done. This may include ultrasounds, computed tomography (CT) scans, or magnetic resonance imaging (MRI). TREATMENT  Treatment depends on the severity of your injury. Your caregiver may prescribe pain medicines and deep breathing exercises. HOME CARE INSTRUCTIONS  Limit your activities until you can move around without much pain.  Do not do any strenuous work until your injury is healed.  Put ice on the injured area.  Put ice in a plastic bag.  Place a towel between your skin and the bag.  Leave the ice on for 15-20 minutes, 03-04 times a day.  You may wear a rib belt as directed by your caregiver to reduce pain.  Practice deep breathing as directed by your caregiver to keep your lungs clear.  Only take over-the-counter or prescription medicines for pain, fever, or discomfort as directed by your caregiver. SEEK IMMEDIATE MEDICAL  CARE IF:   You have increasing pain or shortness of breath.  You cough up blood.  You have nausea, vomiting, or abdominal pain.  You have a fever.  You feel dizzy, weak, or you faint. MAKE SURE YOU:  Understand these instructions.  Will watch your condition.  Will get help right away if you are not doing well or get worse.   This information is not intended to replace advice given to you by your health care provider. Make sure you discuss any questions you have with your health care provider.   Document Released: 09/16/2004 Document Revised: 08/30/2014 Document Reviewed: 02/05/2015 Elsevier Interactive Patient Education 2016 [place] A contusion is a deep bruise. Contusions are the result of a blunt injury to tissues and muscle fibers under the skin. The injury causes bleeding under the skin. The skin overlying the contusion may turn blue, purple, or yellow. Minor injuries will give you a painless contusion, but more severe contusions may stay painful and swollen for a few weeks.  CAUSES  This condition is usually caused by a blow, trauma, or direct force to an area of the body. SYMPTOMS  Symptoms of this condition include:  Swelling of the injured area.  Pain and tenderness in the injured area.  Discoloration. The area may have redness and then turn blue, purple, or yellow. DIAGNOSIS  This condition is diagnosed based on a physical exam and medical history. An X-ray, CT scan, or MRI may be needed to determine if there are any associated injuries, such as  broken bones (fractures). TREATMENT  Specific treatment for this condition depends on what area of the body was injured. In general, the best treatment for a contusion is resting, icing, applying pressure to (compression), and elevating the injured area. This is often called the RICE strategy. Over-the-counter anti-inflammatory medicines may also be recommended for pain control.  HOME CARE INSTRUCTIONS    Rest the injured area.  If directed, apply ice to the injured area:  Put ice in a plastic bag.  Place a towel between your skin and the bag.  Leave the ice on for 20 minutes, 2-3 times per day.  If directed, apply light compression to the injured area using an elastic bandage. Make sure the bandage is not wrapped too tightly. Remove and reapply the bandage as directed by your health care provider.  If possible, raise (elevate) the injured area above the level of your heart while you are sitting or lying down.  Take over-the-counter and prescription medicines only as told by your health care provider. SEEK MEDICAL CARE IF:  Your symptoms do not improve after several days of treatment.  Your symptoms get worse.  You have difficulty moving the injured area. SEEK IMMEDIATE MEDICAL CARE IF:   You have severe pain.  You have numbness in a hand or foot.  Your hand or foot turns pale or cold.   This information is not intended to replace advice given to you by your health care provider. Make sure you discuss any questions you have with your health care provider.   Document Released: 05/19/2005 Document Revised: 04/30/2015 Document Reviewed: 12/25/2014 Elsevier Interactive Patient Education 2016 [employer]  Wrist Sprain A wrist sprain is a stretch or tear in the strong, fibrous tissues (ligaments) that connect your wrist bones. The ligaments of your wrist may be easily sprained. There are three types of wrist sprains.  Grade 1. The ligament is not stretched or torn, but the sprain causes pain.  Grade 2. The ligament is stretched or partially torn. You may be able to move your wrist, but not very much.  Grade 3. The ligament or muscle completely tears. You may find it difficult or extremely painful to move your wrist even a little. CAUSES Often, wrist sprains are a result of a fall or an injury. The force of the impact causes the fibers of your ligament to stretch too much  or tear. Common causes of wrist sprains include:  Overextending your wrist while catching a ball with your hands.  Repetitive or strenuous extension or bending of your wrist.  Landing on your hand during a fall. RISK FACTORS  Having previous wrist injuries.  Playing contact sports, such as boxing or wrestling.  Participating in activities in which falling is common.  Having poor wrist strength and flexibility. SIGNS AND SYMPTOMS  Wrist pain.  Wrist tenderness.  Inflammation or bruising of the wrist area.  Hearing a "pop" or feeling a tear at the time of the injury.  Decreased wrist movement due to pain, stiffness, or weakness. DIAGNOSIS Your health care provider will examine your wrist. In some cases, an X-ray will be taken to make sure you did not break any bones. If your health care provider thinks that you tore a ligament, he or she may order an MRI of your wrist. TREATMENT Treatment involves resting and icing your wrist. You may also need to take pain medicines to help lessen pain and inflammation. Your health care provider may recommend keeping your wrist still (immobilized) with  a splint to help your sprain heal. When the splint is no longer necessary, you may need to perform strengthening and stretching exercises. These exercises help you to regain strength and full range of motion in your wrist. Surgery is not usually needed for wrist sprains unless the ligament completely tears. HOME CARE INSTRUCTIONS  Rest your wrist. Do not do things that cause pain.  Wear your wrist splint as directed by your health care provider.  Take medicines only as directed by your health care provider.  To ease pain and swelling, apply ice to the injured area.  Put ice in a plastic bag.  Place a towel between your skin and the bag.  Leave the ice on for 20 minutes, 2-3 times a day. SEEK MEDICAL CARE IF:  Your pain, discomfort, or swelling gets worse even with treatment.  You feel  sudden numbness in your hand.   This information is not intended to replace advice given to you by your health care provider. Make sure you discuss any questions you have with your health care provider.   Document Released: 04/12/2014 Document Reviewed: 04/12/2014 Elsevier Interactive Patient Education [employer].

## 2015-12-02 NOTE — Telephone Encounter (Signed)
T/C from [place] @ [facility] asking about the PA for this pt. I told her the Ref # and she went on line and said it did not exiist. She asked me to call again. I called and spoke to Corene Cornea D who gave me the same Ref # that was already approved for the colonoscopy scheduled for 12/03/2015.  Ref # [id number].  I returned Lynnda Shields' call to [phone] and left Vm with the information.

## 2015-12-03 ENCOUNTER — Encounter (HOSPITAL_COMMUNITY)
Admission: RE | Disposition: A | Discharge status: Home / Self Care | Payer: Self-pay | Source: Ambulatory Visit | Attending: Internal Medicine

## 2015-12-03 ENCOUNTER — Ambulatory Visit (HOSPITAL_COMMUNITY)
Admission: RE | Admit: 2015-12-03 | Discharge: 2015-12-03 | Disposition: A | Discharge status: Home / Self Care | Payer: 59 | Source: Ambulatory Visit | Attending: Internal Medicine | Admitting: Internal Medicine

## 2015-12-03 ENCOUNTER — Encounter (HOSPITAL_COMMUNITY): Payer: Self-pay | Admitting: *Deleted

## 2015-12-03 DIAGNOSIS — K5730 Diverticulosis of large intestine without perforation or abscess without bleeding: Secondary | ICD-10-CM | POA: Diagnosis not present

## 2015-12-03 DIAGNOSIS — Z794 Long term (current) use of insulin: Secondary | ICD-10-CM | POA: Insufficient documentation

## 2015-12-03 DIAGNOSIS — E7800 Pure hypercholesterolemia, unspecified: Secondary | ICD-10-CM | POA: Diagnosis not present

## 2015-12-03 DIAGNOSIS — Z7982 Long term (current) use of aspirin: Secondary | ICD-10-CM | POA: Diagnosis not present

## 2015-12-03 DIAGNOSIS — E119 Type 2 diabetes mellitus without complications: Secondary | ICD-10-CM | POA: Diagnosis not present

## 2015-12-03 DIAGNOSIS — Z79899 Other long term (current) drug therapy: Secondary | ICD-10-CM | POA: Insufficient documentation

## 2015-12-03 DIAGNOSIS — E039 Hypothyroidism, unspecified: Secondary | ICD-10-CM | POA: Diagnosis not present

## 2015-12-03 DIAGNOSIS — D120 Benign neoplasm of cecum: Secondary | ICD-10-CM

## 2015-12-03 DIAGNOSIS — G4730 Sleep apnea, unspecified: Secondary | ICD-10-CM | POA: Diagnosis not present

## 2015-12-03 DIAGNOSIS — K219 Gastro-esophageal reflux disease without esophagitis: Secondary | ICD-10-CM | POA: Insufficient documentation

## 2015-12-03 DIAGNOSIS — Z1211 Encounter for screening for malignant neoplasm of colon: Secondary | ICD-10-CM | POA: Diagnosis not present

## 2015-12-03 DIAGNOSIS — I10 Essential (primary) hypertension: Secondary | ICD-10-CM | POA: Insufficient documentation

## 2015-12-03 DIAGNOSIS — M1991 Primary osteoarthritis, unspecified site: Secondary | ICD-10-CM | POA: Diagnosis not present

## 2015-12-03 DIAGNOSIS — Z86010 Personal history of colonic polyps: Secondary | ICD-10-CM | POA: Insufficient documentation

## 2015-12-03 HISTORY — PX: COLONOSCOPY: SHX5424

## 2015-12-03 LAB — GLUCOSE, CAPILLARY: Glucose-Capillary: 135 mg/dL — ABNORMAL HIGH (ref 65–99)

## 2015-12-03 SURGERY — COLONOSCOPY
Anesthesia: Moderate Sedation

## 2015-12-03 MED ORDER — MIDAZOLAM HCL 5 MG/5ML IJ SOLN
INTRAMUSCULAR | Status: AC
  Filled 2015-12-03: qty 10

## 2015-12-03 MED ORDER — ONDANSETRON HCL 4 MG/2ML IJ SOLN
INTRAMUSCULAR | Status: DC | PRN
  Administered 2015-12-03: 4 mg via INTRAVENOUS

## 2015-12-03 MED ORDER — STERILE WATER FOR IRRIGATION IR SOLN
Status: DC | PRN
  Administered 2015-12-03: 09:00:00

## 2015-12-03 MED ORDER — MEPERIDINE HCL 100 MG/ML IJ SOLN
INTRAMUSCULAR | Status: DC | PRN
  Administered 2015-12-03: 25 mg via INTRAVENOUS
  Administered 2015-12-03: 50 mg via INTRAVENOUS

## 2015-12-03 MED ORDER — MIDAZOLAM HCL 5 MG/5ML IJ SOLN
INTRAMUSCULAR | Status: DC | PRN
  Administered 2015-12-03: 2 mg via INTRAVENOUS
  Administered 2015-12-03 (×3): 1 mg via INTRAVENOUS

## 2015-12-03 MED ORDER — MEPERIDINE HCL 100 MG/ML IJ SOLN
INTRAMUSCULAR | Status: AC
  Filled 2015-12-03: qty 2

## 2015-12-03 MED ORDER — SODIUM CHLORIDE 0.9 % IV SOLN
INTRAVENOUS | Status: DC
  Administered 2015-12-03: 1000 mL via INTRAVENOUS

## 2015-12-03 MED ORDER — ONDANSETRON HCL 4 MG/2ML IJ SOLN
INTRAMUSCULAR | Status: AC
  Filled 2015-12-03: qty 2

## 2015-12-03 NOTE — Op Note (Signed)
[facility] Patient Name: [name] Procedure Date: 12/03/2015 8:25 AM MRN: [id number] Date of Birth: Apr 13, 1951 Attending MD: Norvel Richards , MD CSN: [id number] Age: 65 Admit Type: Outpatient Procedure:                Colonoscopy Indications:              Screening for colorectal malignant neoplasm Providers:                Norvel Richards, MD, Gwenlyn Fudge, RN, Isabella Stalling, Technician Referring MD:             Dr. Hilma Favors, MD (Referring MD) Medicines:                Midazolam 5 mg IV, Meperidine 75 mg IV, Ondansetron                            4 mg IV Complications:            No immediate complications. Estimated Blood Loss:     Estimated blood loss was minimal. Procedure:                Pre-Anesthesia Assessment:                           - Prior to the procedure, a History and Physical                            was performed, and patient medications and                            allergies were reviewed. The patient's tolerance of                            previous anesthesia was also reviewed. The risks                            and benefits of the procedure and the sedation                            options and risks were discussed with the patient.                            All questions were answered, and informed consent                            was obtained. Prior Anticoagulants: The patient has                            taken no previous anticoagulant or antiplatelet                            agents. ASA Grade Assessment: II - A patient with  mild systemic disease. After reviewing the risks                            and benefits, the patient was deemed in                            satisfactory condition to undergo the procedure.                           After obtaining informed consent, the colonoscope                            was passed under direct vision. Throughout the                procedure, the patient's blood pressure, pulse, and                            oxygen saturations were monitored continuously. The                            EC-[id number] scope was introduced through                            the anus and advanced to the the cecum, identified                            by appendiceal orifice and ileocecal valve. The                            colonoscopy was performed without difficulty. The                            patient tolerated the procedure well. The quality                            of the bowel preparation was adequate. The                            ileocecal valve, appendiceal orifice, and rectum                            were photographed. The colonoscopy was performed                            without difficulty. The patient tolerated the                            procedure well. Scope In: 9:02:04 AM Scope Out: 9:20:16 AM Scope Withdrawal Time: 0 hours 10 minutes 5 seconds  Total Procedure Duration: 0 hours 18 minutes 12 seconds  Findings:      The perianal and digital rectal examinations were normal.      Scattered medium-mouthed diverticula were found in the sigmoid colon and       descending colon. There was no evidence of diverticular bleeding.  Estimated blood loss: none.      A 3 mm polyp was found in the cecum. The polyp was sessile. The polyp       was removed with a cold biopsy forceps. Resection was complete, and       retrieval was complete. Estimated blood loss was minimal.      Internal hemorrhoids were found. Impression:               - Diverticulosis in the sigmoid colon and in the                            descending colon. There was no evidence of                            diverticular bleeding.                           - One 3 mm polyp in the cecum, removed with a cold                            biopsy forceps. Resected and retrieved. Moderate Sedation:      Moderate (conscious) sedation  was administered by the endoscopy nurse       and supervised by the endoscopist. The following parameters were       monitored: oxygen saturation, heart rate, blood pressure, respiratory       rate, EKG, adequacy of pulmonary ventilation, and response to care.       Total physician intraservice time was 30 minutes. Recommendation:           - Patient has a contact number available for                            emergencies. The signs and symptoms of potential                            delayed complications were discussed with the                            patient. Return to normal activities tomorrow.                            Written discharge instructions were provided to the                            patient.                           - Advance diet as tolerated.                           - Await pathology results.                           - Repeat colonoscopy (date not yet determined) for                            surveillance  based on pathology results.                           - Return to GI office PRN.                           - Continue present medications. Procedure Code(s):        --- Professional ---                           [phone], Colonoscopy, flexible; with biopsy, single                            or multiple                           99152, Moderate sedation services provided by the                            same physician or other qualified health care                            professional performing the diagnostic or                            therapeutic service that the sedation supports,                            requiring the presence of an independent trained                            observer to assist in the monitoring of the                            patient's level of consciousness and physiological                            status; initial 15 minutes of intraservice time,                            patient age 86 years or older                            [phone], Moderate sedation services; each additional                            15 minutes intraservice time Diagnosis Code(s):        --- Professional ---                           Z12.11, Encounter for screening for malignant                            neoplasm of colon  D12.0, Benign neoplasm of cecum                           K57.30, Diverticulosis of large intestine without                            perforation or abscess without bleeding CPT copyright 2016 American Medical Association. All rights reserved. The codes documented in this report are preliminary and upon coder review may  be revised to meet current compliance requirements. Cristopher Estimable. Jenelle Drennon, MD Norvel Richards, MD 12/03/2015 9:31:33 AM This report has been signed electronically. Number of Addenda: 0

## 2015-12-03 NOTE — H&P (Signed)
@LOGO @   Primary Care Physician:  Purvis Kilts, MD Primary Gastroenterologist:  Dr. Gala Romney  Pre-Procedure History & Physical: HPI:  [name] is a 65 y.o. female is here for a screening colonoscopy.   Past Medical History  Diagnosis Date  . Hypertension   . High cholesterol   . Heart murmur   . Type II diabetes mellitus ([place])   . Anginal pain ([place])     "often; usually when thyroid goes out of wack and heart starts fluttering" (06/18/2013)  . Dysrhythmia     "palpitation-type feelings; related to thyroid" (06/18/2013)  . Carotid stenosis ~ 01/2013    "bilaterally; ~ 50%; dx'd/carotid US" (06/18/2013)  . Hypothyroidism   . Pneumonia ?1980's; 09/2011    "twice" (06/18/2013)  . Exertional shortness of breath   . Sleep apnea     "have mask; haven't worn it in ~ 1 yr" (06/18/2013)  . Anemia     "as a kid" (06/18/2013)  . GERD (gastroesophageal reflux disease)   . [id number])     "monthly" (06/18/2013)  . Arthritis     "joints probably" (101/27/2014)  . Gout   . Membranous glomerulonephritis   . Hepatitis, unspecified 1972    "husband had hepatitis; my mother, daughter, and me all had to get the gammaglobulin shots" (06/18/2013)    Past Surgical History  Procedure Laterality Date  . Cesarean section  1971; 1977  . Shoulder arthroscopy w/ rotator cuff repair Right 2009  . Bunionectomy Bilateral [id number]    "once on each side" (06/18/2013)  . Cholecystectomy  ?1998  . Renal biopsy Left ~ 1997; 06/18/2013  . Tonsillectomy      Prior to Admission medications   Medication Sig Start Date End Date Taking? Authorizing Provider  allopurinol (ZYLOPRIM) 300 MG tablet Take 300 mg by mouth daily.   Yes Historical Provider, MD  amoxicillin-clavulanate (AUGMENTIN) 875-125 MG tablet Take 1 tablet by mouth 2 (two) times daily. 10 day regimen started 4/6 11/27/15  Yes Historical Provider, MD  aspirin 81 MG tablet Take 162 mg by mouth daily.    Yes Historical Provider, MD   cetirizine (ZYRTEC) 10 MG tablet Take 10 mg by mouth at bedtime.   Yes Historical Provider, MD  Corticotropin (ACTHAR HP IJ) Inject 80 Units as directed See admin instructions. Takes the injections every Tuesday and Friday   Yes Historical Provider, MD  dexlansoprazole (DEXILANT) 60 MG capsule Take 60 mg by mouth every morning.    Yes Historical Provider, MD  diltiazem (CARDIZEM CD) 240 MG 24 hr capsule Take 240 mg by mouth at bedtime.   Yes Historical Provider, MD  fenofibrate (TRICOR) 145 MG tablet Take 145 mg by mouth at bedtime.    Yes Historical Provider, MD  furosemide (LASIX) 40 MG tablet Take 40 mg by mouth daily.   Yes Historical Provider, MD  insulin aspart (NOVOLOG) 100 UNIT/ML injection Inject 3-8 Units into the skin 3 (three) times daily before meals. On sliding scale   Yes Historical Provider, MD  insulin glargine (LANTUS) 100 UNIT/ML injection Inject 15 Units into the skin at bedtime.   Yes Historical Provider, MD  linagliptin (TRADJENTA) 5 MG TABS tablet Take 5 mg by mouth every morning. Reported on 10/28/2015   Yes Historical Provider, MD  metoprolol succinate (TOPROL-XL) 50 MG 24 hr tablet Take 1 & 1/2 tablet daily Patient taking differently: Take 75 mg by mouth daily.  05/27/15  Yes Troy Sine, MD  Multiple Vitamin (MULTIVITAMIN WITH MINERALS)  TABS tablet Take 1 tablet by mouth daily. Reported on 10/28/2015   Yes Historical Provider, MD  Nutritional Supplements (ESTROVEN) TABS Take 1 tablet by mouth at bedtime. Reported on 10/28/2015   Yes Historical Provider, MD  Omega-3 Fatty Acids (FISH OIL) 1200 MG CAPS Take 2 capsules by mouth every morning. Reported on 10/28/2015   Yes Historical Provider, MD  rOPINIRole (REQUIP) 0.5 MG tablet Take 1 mg by mouth at bedtime.    Yes Historical Provider, MD  rosuvastatin (CRESTOR) 40 MG tablet Take 40 mg by mouth at bedtime.   Yes Historical Provider, MD  SYNTHROID 200 MCG tablet Take 225 mcg by mouth daily.  05/15/15  Yes Historical Provider, MD   SYNTHROID 25 MCG tablet Take 25 mcg by mouth daily. 05/25/15  Yes Historical Provider, MD  TEKTURNA 150 MG tablet Take 1 tablet by mouth daily. 05/16/15  Yes Historical Provider, MD  telmisartan (MICARDIS) 80 MG tablet Take 160 mg by mouth at bedtime.    Yes Historical Provider, MD    Allergies as of 11/10/2015 - Review Complete 10/28/2015  Allergen Reaction Noted  . Codeine Nausea And Vomiting 02/11/2011  . Ibuprofen Other (See Comments) 03/28/2013  . Nsaids Other (See Comments) 02/11/2011  . Shellfish allergy Other (See Comments) 10/29/2012  . Septra ds [sulfamethoxazole-trimethoprim] Itching and Rash 08/21/2014    Family History  Problem Relation Age of Onset  . Cirrhosis Mother 72    Non-alcoholic related  . Coronary artery disease Father     Social History   Social History  . Marital Status: Married    Spouse Name: N/A  . Number of Children: N/A  . Years of Education: N/A   Occupational History  . Not on file.   Social History Main Topics  . Smoking status: Never Smoker   . Smokeless tobacco: Never Used  . Alcohol Use: No  . Drug Use: No  . Sexual Activity: No   Other Topics Concern  . Not on file   Social History Narrative    Review of Systems: See HPI, otherwise negative ROS  Physical Exam: BP 159/86 mmHg  Pulse 88  Temp(Src) 98.4 F (36.9 C) (Oral)  Resp 16  Ht 5\' 1"  (1.549 m)  Wt 151 lb (68.493 kg)  BMI 28.55 kg/m2  SpO2 100% General:   Alert,  Well-developed, well-nourished, pleasant and cooperative in NAD Head:  Normocephalic and atraumatic. Eyes:  Sclera clear, no icterus.   Conjunctiva pink. Neck:  Supple; no masses or thyromegaly. Lungs:  Clear throughout to auscultation.   No wheezes, crackles, or rhonchi. No acute distress. Heart:  Regular rate and rhythm; no murmurs, clicks, rubs,  or gallops. Abdomen:  Soft, nontender and nondistended. No masses, hepatosplenomegaly or hernias noted. Normal bowel sounds, without guarding, and without  rebound.   Msk:  Symmetrical without gross deformities. Normal posture. Pulses:  Normal pulses noted. Extremities:  Without clubbing or edema.  Impression/Plan: [name] is now here to undergo a screening colonoscopy. Average risk screening examination  Risks, benefits, limitations, imponderables and alternatives regarding colonoscopy have been reviewed with the patient. Questions have been answered. All parties agreeable.     Notice:  This dictation was prepared with Dragon dictation along with smaller phrase technology. Any transcriptional errors that result from this process are unintentional and may not be corrected upon review.

## 2015-12-03 NOTE — Discharge Instructions (Signed)
Colonoscopy Discharge Instructions  Read the instructions outlined below and refer to this sheet in the next few weeks. These discharge instructions provide you with general information on caring for yourself after you leave the hospital. Your doctor may also give you specific instructions. While your treatment has been planned according to the most current medical practices available, unavoidable complications occasionally occur. If you have any problems or questions after discharge, call Dr. Gala Romney at [phone]. ACTIVITY  You may resume your regular activity, but move at a slower pace for the next 24 hours.   Take frequent rest periods for the next 24 hours.   Walking will help get rid of the air and reduce the bloated feeling in your belly (abdomen).   No driving for 24 hours (because of the medicine (anesthesia) used during the test).    Do not sign any important legal documents or operate any machinery for 24 hours (because of the anesthesia used during the test).  NUTRITION  Drink plenty of fluids.   You may resume your normal diet as instructed by your doctor.   Begin with a light meal and progress to your normal diet. Heavy or fried foods are harder to digest and may make you feel sick to your stomach (nauseated).   Avoid alcoholic beverages for 24 hours or as instructed.  MEDICATIONS  You may resume your normal medications unless your doctor tells you otherwise.  WHAT YOU CAN EXPECT TODAY  Some feelings of bloating in the abdomen.   Passage of more gas than usual.   Spotting of blood in your stool or on the toilet paper.  IF YOU HAD POLYPS REMOVED DURING THE COLONOSCOPY:  No aspirin products for 7 days or as instructed.   No alcohol for 7 days or as instructed.   Eat a soft diet for the next 24 hours.  FINDING OUT THE RESULTS OF YOUR TEST Not all test results are available during your visit. If your test results are not back during the visit, make an appointment  with your caregiver to find out the results. Do not assume everything is normal if you have not heard from your caregiver or the medical facility. It is important for you to follow up on all of your test results.  SEEK IMMEDIATE MEDICAL ATTENTION IF:  You have more than a spotting of blood in your stool.   Your belly is swollen (abdominal distention).   You are nauseated or vomiting.   You have a temperature over 101.   You have abdominal pain or discomfort that is severe or gets worse throughout the day.    Colon polyp and diverticulosis information provided  Further recommendations to follow pending review of pathology report      Colon Polyps Polyps are lumps of extra tissue growing inside the body. Polyps can grow in the large intestine (colon). Most colon polyps are noncancerous (benign). However, some colon polyps can become cancerous over time. Polyps that are larger than a pea may be harmful. To be safe, caregivers remove and test all polyps. CAUSES  Polyps form when mutations in the genes cause your cells to grow and divide even though no more tissue is needed. RISK FACTORS There are a number of risk factors that can increase your chances of getting colon polyps. They include:  Being older than 50 years.  Family history of colon polyps or colon cancer.  Long-term colon diseases, such as colitis or Crohn disease.  Being overweight.  Smoking.  Being  inactive.  Drinking too much alcohol. SYMPTOMS  Most small polyps do not cause symptoms. If symptoms are present, they may include:  Blood in the stool. The stool may look dark red or black.  Constipation or diarrhea that lasts longer than 1 week. DIAGNOSIS People often do not know they have polyps until their caregiver finds them during a regular checkup. Your caregiver can use 4 tests to check for polyps:  Digital rectal exam. The caregiver wears gloves and feels inside the rectum. This test would find polyps  only in the rectum.  Barium enema. The caregiver puts a liquid called barium into your rectum before taking X-rays of your colon. Barium makes your colon look white. Polyps are dark, so they are easy to see in the X-ray pictures.  Sigmoidoscopy. A thin, flexible tube (sigmoidoscope) is placed into your rectum. The sigmoidoscope has a light and tiny camera in it. The caregiver uses the sigmoidoscope to look at the last third of your colon.  Colonoscopy. This test is like sigmoidoscopy, but the caregiver looks at the entire colon. This is the most common method for finding and removing polyps. TREATMENT  Any polyps will be removed during a sigmoidoscopy or colonoscopy. The polyps are then tested for cancer. PREVENTION  To help lower your risk of getting more colon polyps:  Eat plenty of fruits and vegetables. Avoid eating fatty foods.  Do not smoke.  Avoid drinking alcohol.  Exercise every day.  Lose weight if recommended by your caregiver.  Eat plenty of calcium and folate. Foods that are rich in calcium include milk, cheese, and broccoli. Foods that are rich in folate include chickpeas, kidney beans, and spinach. HOME CARE INSTRUCTIONS Keep all follow-up appointments as directed by your caregiver. You may need periodic exams to check for polyps. SEEK MEDICAL CARE IF: You notice bleeding during a bowel movement.   This information is not intended to replace advice given to you by your health care provider. Make sure you discuss any questions you have with your health care provider.   Document Released: 05/05/2004 Document Revised: 08/30/2014 Document Reviewed: 10/19/2011 Elsevier Interactive Patient Education 2016 [employer].     Diverticulosis Diverticulosis is the condition that develops when small pouches (diverticula) form in the wall of your colon. Your colon, or large intestine, is where water is absorbed and stool is formed. The pouches form when the inside layer of your  colon pushes through weak spots in the outer layers of your colon. CAUSES  No one knows exactly what causes diverticulosis. RISK FACTORS  Being older than 64. Your risk for this condition increases with age. Diverticulosis is rare in people younger than 40 years. By age 30, almost everyone has it.  Eating a low-fiber diet.  Being frequently constipated.  Being overweight.  Not getting enough exercise.  Smoking.  Taking over-the-counter pain medicines, like aspirin and ibuprofen. SYMPTOMS  Most people with diverticulosis do not have symptoms. DIAGNOSIS  Because diverticulosis often has no symptoms, health care providers often discover the condition during an exam for other colon problems. In many cases, a health care provider will diagnose diverticulosis while using a flexible scope to examine the colon (colonoscopy). TREATMENT  If you have never developed an infection related to diverticulosis, you may not need treatment. If you have had an infection before, treatment may include:  Eating more fruits, vegetables, and grains.  Taking a fiber supplement.  Taking a live bacteria supplement (probiotic).  Taking medicine to relax your colon.  HOME CARE INSTRUCTIONS   Drink at least 6-8 glasses of water each day to prevent constipation.  Try not to strain when you have a bowel movement.  Keep all follow-up appointments. If you have had an infection before:  Increase the fiber in your diet as directed by your health care provider or dietitian.  Take a dietary fiber supplement if your health care provider approves.  Only take medicines as directed by your health care provider. SEEK MEDICAL CARE IF:   You have abdominal pain.  You have bloating.  You have cramps.  You have not gone to the bathroom in 3 days. SEEK IMMEDIATE MEDICAL CARE IF:   Your pain gets worse.  Yourbloating becomes very bad.  You have a fever or chills, and your symptoms suddenly get  worse.  You begin vomiting.  You have bowel movements that are bloody or black. MAKE SURE YOU:  Understand these instructions.  Will watch your condition.  Will get help right away if you are not doing well or get worse.   This information is not intended to replace advice given to you by your health care provider. Make sure you discuss any questions you have with your health care provider.   Document Released: 05/06/2004 Document Revised: 08/14/2013 Document Reviewed: 07/04/2013 Elsevier Interactive Patient Education [employer].

## 2015-12-04 ENCOUNTER — Encounter: Payer: Self-pay | Admitting: Internal Medicine

## 2015-12-08 ENCOUNTER — Encounter (HOSPITAL_COMMUNITY): Payer: Self-pay | Admitting: Internal Medicine

## 2015-12-18 ENCOUNTER — Other Ambulatory Visit (HOSPITAL_COMMUNITY): Payer: Self-pay | Admitting: Family Medicine

## 2015-12-18 ENCOUNTER — Ambulatory Visit (HOSPITAL_COMMUNITY)
Admission: RE | Admit: 2015-12-18 | Discharge: 2015-12-18 | Disposition: A | Discharge status: Home / Self Care | Payer: 59 | Source: Ambulatory Visit | Attending: Family Medicine | Admitting: Family Medicine

## 2015-12-18 DIAGNOSIS — X58XXXS Exposure to other specified factors, sequela: Secondary | ICD-10-CM | POA: Insufficient documentation

## 2015-12-18 DIAGNOSIS — Z6828 Body mass index (BMI) 28.0-28.9, adult: Secondary | ICD-10-CM | POA: Insufficient documentation

## 2015-12-18 DIAGNOSIS — S63502S Unspecified sprain of left wrist, sequela: Secondary | ICD-10-CM

## 2015-12-18 DIAGNOSIS — E663 Overweight: Secondary | ICD-10-CM | POA: Diagnosis not present

## 2015-12-18 DIAGNOSIS — Z1389 Encounter for screening for other disorder: Secondary | ICD-10-CM | POA: Insufficient documentation

## 2015-12-30 ENCOUNTER — Other Ambulatory Visit: Payer: Self-pay

## 2015-12-30 MED ORDER — METOPROLOL SUCCINATE ER 50 MG PO TB24: ORAL_TABLET | ORAL | Status: DC

## 2015-12-31 ENCOUNTER — Telehealth: Payer: Self-pay | Admitting: Cardiovascular Disease

## 2015-12-31 NOTE — Telephone Encounter (Signed)
Received records from [facility] for appointment on 01/08/16 with Dr Claiborne Billings.  Records given to [facility] (medical records) for Dr Evette Georges schedule on 01/08/16. lp

## 2016-01-08 ENCOUNTER — Encounter: Payer: Self-pay | Admitting: Cardiovascular Disease

## 2016-01-08 ENCOUNTER — Ambulatory Visit (INDEPENDENT_AMBULATORY_CARE_PROVIDER_SITE_OTHER): Payer: 59 | Admitting: Cardiovascular Disease

## 2016-01-08 VITALS — BP 182/96 | HR 75 | Ht 61.0 in | Wt 150.0 lb

## 2016-01-08 DIAGNOSIS — E118 Type 2 diabetes mellitus with unspecified complications: Secondary | ICD-10-CM

## 2016-01-08 DIAGNOSIS — E039 Hypothyroidism, unspecified: Secondary | ICD-10-CM

## 2016-01-08 DIAGNOSIS — N052 Unspecified nephritic syndrome with diffuse membranous glomerulonephritis: Secondary | ICD-10-CM

## 2016-01-08 DIAGNOSIS — E785 Hyperlipidemia, unspecified: Secondary | ICD-10-CM

## 2016-01-08 DIAGNOSIS — N032 Chronic nephritic syndrome with diffuse membranous glomerulonephritis: Secondary | ICD-10-CM

## 2016-01-08 DIAGNOSIS — G4733 Obstructive sleep apnea (adult) (pediatric): Secondary | ICD-10-CM

## 2016-01-08 DIAGNOSIS — I10 Essential (primary) hypertension: Secondary | ICD-10-CM | POA: Diagnosis not present

## 2016-01-08 MED ORDER — METOPROLOL SUCCINATE ER 100 MG PO TB24: 100.0000 mg | ORAL_TABLET | Freq: Every day | ORAL | Status: DC

## 2016-01-08 NOTE — Patient Instructions (Signed)
Your physician has recommended you make the following change in your medication:   1.) The metoprolol has been changed to 100 mg daily.  Your physician recommends that you schedule a follow-up appointment in: 4 months with Dr Claiborne Billings.

## 2016-01-10 ENCOUNTER — Encounter: Payer: Self-pay | Admitting: Cardiovascular Disease

## 2016-01-10 NOTE — Progress Notes (Signed)
Patient ID: [name], female   DOB: 06/14/1951, 64 y.o.   MRN: [id number]    Primary M.D.: Dr. John Golding Primary nephrologist: Dr. Deterding  HPI: [name] is a 64 y.o. female who presents for a 7  month cardiology evaluation.  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 Deterding. She also has a history of hypertension, hypothyroidism,  DM2, GERD as well as obstructive sleep apnea and has only  intermittently utilized CPAP therapy. Her OSA is moderate with an AHI of 20.9 but severe during REM sleep with an AHI of 54.3 which was noted on her initial sleep study in 2009.  She also has a history of hyperlipidemia and has been on combination therapy with fenofibrate and Crestor.  She underwent another kidney biopsy in October 2014.  This did not show any significant pathologic change.  She has been on direct renin inhibition and ARB therapy in attempt to reduce protein excretion.  She tells recently, her proteinuria has increased to 10 grams. She has a history of hypothyroidism and is on thyroid replacement.   She has a history of hyperlipidemia and has been on combination Crestor and fenofibrate.  In addition to her tekturna and micardis she also has been taking Lasix 40 mg daily, Toprol-XL 50 mg and Cardizem 240 mg for additional blood pressure control.  She is diabetic on Levemir insulin.  She no longer is taking Tradjenta.  She now sees Dr. Balen for her endocrinologic care.  She has a history of obstructive sleep apnea but states that she has not been using CPAP therapy.  She uses a mouthguard for TMJ.  She denies chest pain brought on with exertion.  She admits to ankle swelling.  She tells me she had undergone an MRI of her brain and was told of having abnormality in her sinuses for which she was given a prescription for Augmentin.   Past Medical History  Diagnosis Date  . Hypertension     . High cholesterol   . Heart murmur   . Type II diabetes mellitus (HCC)   . Anginal pain (HCC)     "often; usually when thyroid goes out of wack and heart starts fluttering" (06/18/2013)  . Dysrhythmia     "palpitation-type feelings; related to thyroid" (06/18/2013)  . Carotid stenosis ~ 01/2013    "bilaterally; ~ 50%; dx'd/carotid US" (06/18/2013)  . Hypothyroidism   . Pneumonia ?1980's; 09/2011    "twice" (06/18/2013)  . Exertional shortness of breath   . Sleep apnea     "have mask; haven't worn it in ~ 1 yr" (06/18/2013)  . Anemia     "as a kid" (06/18/2013)  . GERD (gastroesophageal reflux disease)   . Headache(784.0)     "monthly" (06/18/2013)  . Arthritis     "joints probably" (101/27/2014)  . Gout   . Membranous glomerulonephritis   . Hepatitis, unspecified 1972    "husband had hepatitis; my mother, daughter, and me all had to get the gammaglobulin shots" (06/18/2013)    Past Surgical History  Procedure Laterality Date  . Cesarean section  1971; 1977  . Shoulder arthroscopy w/ rotator cuff repair Right 2009  . Bunionectomy Bilateral 1994-1997    "once on each side" (06/18/2013)  . Cholecystectomy  ?1998  . Renal biopsy Left ~ 1997; 06/18/2013  . Tonsillectomy    . Colonoscopy N/A 12/03/2015    Procedure: COLONOSCOPY;  Surgeon:   Robert M Rourk, MD;  Location: [facility];  Service: Endoscopy;  Laterality: N/A;  8:30 AM    Allergies  Allergen Reactions  . Codeine Nausea And Vomiting  . Ibuprofen Other (See Comments)    Per kidney MD.  . Nsaids Other (See Comments)    Per Kidney DR. No NSAIDS  . Shellfish Allergy Other (See Comments)    REACTION: Gout Flare-ups   . Septra Ds [Sulfamethoxazole-Trimethoprim] Itching and Rash    Current Outpatient Prescriptions  Medication Sig Dispense Refill  . allopurinol (ZYLOPRIM) 300 MG tablet Take 300 mg by mouth daily.    . aspirin 81 MG tablet Take 162 mg by mouth daily.     . cetirizine (ZYRTEC) 10 MG tablet Take 10  mg by mouth at bedtime.    . dexlansoprazole (DEXILANT) 60 MG capsule Take 60 mg by mouth every morning.     . diltiazem (CARDIZEM CD) 240 MG 24 hr capsule Take 240 mg by mouth at bedtime.    . fenofibrate (TRICOR) 145 MG tablet Take 145 mg by mouth at bedtime.     . furosemide (LASIX) 40 MG tablet Take 40 mg by mouth daily.    . insulin aspart (NOVOLOG) 100 UNIT/ML injection Inject 3-8 Units into the skin 3 (three) times daily before meals. On sliding scale    . insulin detemir (LEVEMIR) 100 UNIT/ML injection Inject 15 Units into the skin at bedtime.    . insulin glargine (LANTUS) 100 UNIT/ML injection Inject 15 Units into the skin at bedtime.    . Multiple Vitamin (MULTIVITAMIN WITH MINERALS) TABS tablet Take 1 tablet by mouth daily. Reported on 10/28/2015    . Nutritional Supplements (ESTROVEN) TABS Take 1 tablet by mouth at bedtime. Reported on 10/28/2015    . Omega-3 Fatty Acids (FISH OIL) 1200 MG CAPS Take 2 capsules by mouth every morning. Reported on 10/28/2015    . rOPINIRole (REQUIP) 0.5 MG tablet Take 1 mg by mouth at bedtime.     . rosuvastatin (CRESTOR) 40 MG tablet Take 40 mg by mouth at bedtime.    . SYNTHROID 200 MCG tablet Take 225 mcg by mouth daily.   0  . SYNTHROID 25 MCG tablet Take 25 mcg by mouth daily.  0  . TEKTURNA 150 MG tablet Take 1 tablet by mouth daily.  0  . telmisartan (MICARDIS) 80 MG tablet Take 160 mg by mouth at bedtime.     . metoprolol succinate (TOPROL-XL) 100 MG 24 hr tablet Take 1 tablet (100 mg total) by mouth daily. Take with or immediately following a meal. 30 tablet 6   No current facility-administered medications for this visit.    Social History   Social History  . Marital Status: Married    Spouse Name: N/A  . Number of Children: N/A  . Years of Education: N/A   Occupational History  . Not on file.   Social History Main Topics  . Smoking status: Never Smoker   . Smokeless tobacco: Never Used  . Alcohol Use: No  . Drug Use: No  . Sexual  Activity: No   Other Topics Concern  . Not on file   Social History Narrative   Socially, she has just retired.  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  ROS General: Negative; No fevers, chills, or night sweats;  HEENT: Negative; No changes in vision or hearing, sinus congestion, difficulty swallowing Pulmonary: Negative; No cough, wheezing, shortness of   breath, hemoptysis Cardiovascular: Negative; No chest pain, presyncope, syncope, palpitations Positive for occasional edema GI: Negative; No nausea, vomiting, diarrhea, or abdominal pain GU: Negative; No dysuria, hematuria, or difficulty voiding Renal: Positive for membranous glomerulonephropathy with nephrotic proteinuria Musculoskeletal: Positive for TMJ; no myalgias, joint pain, or weakness Hematologic/Oncology: Negative; no easy bruising, bleeding Endocrine: Negative; no heat/cold intolerance; no diabetes Neuro: Negative; no changes in balance, headaches Skin: Negative; No rashes or skin lesions Psychiatric: Negative; No behavioral problems, depression Sleep: Positive for OSA; Not using CPAP Other comprehensive 14 point system review is negative.  PE BP 182/96 mmHg  Pulse 75  Ht 5' 1" (1.549 m)  Wt 150 lb (68.04 kg)  BMI 28.36 kg/m2   Repeat blood pressure by me was 170/90.  Wt Readings from Last 3 Encounters:  01/08/16 150 lb (68.04 kg)  12/03/15 151 lb (68.493 kg)  11/30/15 151 lb (68.493 kg)   General: Alert, oriented, no distress.  Skin: normal turgor, no rashes HEENT: Normocephalic, atraumatic. Pupils round and reactive; sclera anicteric;no lid lag.  Nose without nasal septal hypertrophy Mouth/Parynx benign; Mallinpatti scale 3 Neck: No JVD, no carotid bruits with normal carotid Lungs: clear to ausculatation and percussion; no wheezing or rales Chest wall: Nontender to palpation Heart: RRR, s1 s2 normal 1/6 sem; no diastolic murmur, rubs, thrills or heaves. Abdomen:  soft, nontender; no hepatosplenomehaly, BS+; abdominal aorta nontender and not dilated by palpation. Back: No CVA tenderness Pulses 2+ Extremities: 1+ bilateral ankle edema, no clubbing cyanosis, Homan's sign negative  Neurologic: grossly nonfocal Psychological: Normal affect and mood  ECG (independently read by me): Normal sinus rhythm at 75 bpm.  LVH by voltage criteria.  No significant ST changes.  QTc interval 460 Katherine.  ECG (independently read by me): Sinus rhythm at 99 bpm.  Left axis deviation.  Poor progression anteriorly.  August 2015 ECG (independently read by me): Normal sinus rhythm at 70 beats per minute.  QTc interval 423 Katherine.  PR interval normal at 150 Katherine.  Prior ECG: Normal sinus rhythm at 92 beats per minute. Poor R wave progression.  LABS: BMP Latest Ref Rng 08/21/2014 06/18/2013 10/29/2012  Glucose 70 - 99 mg/dL - 125(H) 195(H)  BUN 6 - 23 mg/dL - 24(H) 14  Creatinine 0.50 - 1.10 mg/dL 1.30(H) 1.08 1.06  Sodium 135 - 145 mEq/L - 139 140  Potassium 3.5 - 5.1 mEq/L - 4.2 4.1  Chloride 96 - 112 mEq/L - 104 103  CO2 19 - 32 mEq/L - 25 25  Calcium 8.4 - 10.5 mg/dL - 10.3 10.4   Hepatic Function Latest Ref Rng 11/07/2011  Total Protein 6.0 - 8.3 g/dL 6.6  Albumin 3.5 - 5.2 g/dL 3.3(L)  AST 0 - 37 U/L 36  ALT 0 - 35 U/L 52(H)  Alk Phosphatase 39 - 117 U/L 43  Total Bilirubin 0.3 - 1.2 mg/dL 0.3    CBC Latest Ref Rng 06/19/2013 06/18/2013 06/18/2013  WBC 4.0 - 10.5 K/uL 10.6(H) 9.4 9.5  Hemoglobin 12.0 - 15.0 g/dL 11.6(L) 11.6(L) 12.7  Hematocrit 36.0 - 46.0 % 33.9(L) 34.1(L) 36.9  Platelets 150 - 400 K/uL 279 293 306   . Lab Results  Component Value Date   MCV 92.6 06/19/2013   MCV 92.4 06/18/2013   MCV 92.3 06/18/2013   No results found for: TSH No results found for: HGBA1C   Lipid Panel  No results found for: CHOL, TRIG, HDL, CHOLHDL, VLDL, LDLCALC, LDLDIRECT   RADIOLOGY: No results found.    ASSESSMENT   AND PLAN: [name] is a 64 year old female who  has a history of hypertension, hypothyroidism, GERD, obstructive sleep apnea as well as her nephrotic range proteinuria due to membranous glomerulonephritis.  She has been on combination therapy with  micardis 160 mg and tekturna 150 mg , both for blood pressure and her proteinuria.  She had recently completed a trial of Acthar without significant benefit in further proteinuria reduction  With her resting pulse at 75  I am recommending that she increase her Toprol to 100 mg daily.  She is seeing Dr. Deterding for her renal issues, and she may be able to tolerate an increase in Tekturna to 300 mg daily, but I will defer this to him.  She is on high-dose Synthroid replacement at 225 g for her thyroid disease and sees Dr. Balen. I had a long discussion with her concerning the importance of using her CPAP all night.  I discussed with her that the preponderance of REM sleep occurs in the latter half at night and her sleep apnea is markedly abnormal, particularly in REM sleep with an AHI of 54.3.  If she has only been using her CPAP therapy for short duration when she needs it the most .  She continues to be on Crestor 40 mg daily in addition to omega-3 fatty acids and fenofibrate for her mixed hyperlipidemia.  I do not have recent lab work pertaining to her lipids. I will try to obtain lab results from her primary physician.  She will be seeing Dr. Deterding next month for nephrology follow-up evaluation.  I will see her in 4 months for cardiology reevaluation.  Time spent: 25 minutes   A. , MD, FACC  01/10/2016 11:26 PM    

## 2016-02-08 ENCOUNTER — Emergency Department (HOSPITAL_COMMUNITY)
Admission: EM | Admit: 2016-02-08 | Discharge: 2016-02-08 | Disposition: A | Discharge status: Home / Self Care | Payer: 59 | Attending: Emergency Medicine | Admitting: Emergency Medicine

## 2016-02-08 ENCOUNTER — Encounter (HOSPITAL_COMMUNITY): Payer: Self-pay | Admitting: Emergency Medicine

## 2016-02-08 ENCOUNTER — Emergency Department (HOSPITAL_COMMUNITY): Payer: 59

## 2016-02-08 DIAGNOSIS — R0781 Pleurodynia: Secondary | ICD-10-CM | POA: Diagnosis present

## 2016-02-08 DIAGNOSIS — E119 Type 2 diabetes mellitus without complications: Secondary | ICD-10-CM | POA: Diagnosis not present

## 2016-02-08 DIAGNOSIS — Z7982 Long term (current) use of aspirin: Secondary | ICD-10-CM | POA: Insufficient documentation

## 2016-02-08 DIAGNOSIS — Y929 Unspecified place or not applicable: Secondary | ICD-10-CM | POA: Insufficient documentation

## 2016-02-08 DIAGNOSIS — E039 Hypothyroidism, unspecified: Secondary | ICD-10-CM | POA: Diagnosis not present

## 2016-02-08 DIAGNOSIS — S20212A Contusion of left front wall of thorax, initial encounter: Secondary | ICD-10-CM

## 2016-02-08 DIAGNOSIS — Z794 Long term (current) use of insulin: Secondary | ICD-10-CM | POA: Diagnosis not present

## 2016-02-08 DIAGNOSIS — W19XXXA Unspecified fall, initial encounter: Secondary | ICD-10-CM

## 2016-02-08 DIAGNOSIS — Y939 Activity, unspecified: Secondary | ICD-10-CM | POA: Insufficient documentation

## 2016-02-08 DIAGNOSIS — I10 Essential (primary) hypertension: Secondary | ICD-10-CM | POA: Insufficient documentation

## 2016-02-08 DIAGNOSIS — M1990 Unspecified osteoarthritis, unspecified site: Secondary | ICD-10-CM | POA: Diagnosis not present

## 2016-02-08 DIAGNOSIS — Z79899 Other long term (current) drug therapy: Secondary | ICD-10-CM | POA: Diagnosis not present

## 2016-02-08 DIAGNOSIS — Y999 Unspecified external cause status: Secondary | ICD-10-CM | POA: Diagnosis not present

## 2016-02-08 DIAGNOSIS — W109XXA Fall (on) (from) unspecified stairs and steps, initial encounter: Secondary | ICD-10-CM | POA: Diagnosis not present

## 2016-02-08 MED ORDER — OXYCODONE-ACETAMINOPHEN 5-325 MG PO TABS: 1.0000 | ORAL_TABLET | ORAL | Status: DC | PRN

## 2016-02-08 NOTE — Discharge Instructions (Signed)
X-ray showed no broken ribs. Medication for pain. You will be sore for several days.

## 2016-02-08 NOTE — ED Provider Notes (Addendum)
CSN: [id number]     Arrival date & time 02/08/16  0847 History  By signing my name below, I, Georgette Shell, attest that this documentation has been prepared under the direction and in the presence of Nat Christen, MD. Electronically Signed: Georgette Shell, ED Scribe. 02/08/2016. 10:03 AM.   Chief Complaint  Patient presents with  . Fall    The history is provided by the patient. No language interpreter was used.  HPI Comments: [name] is a 65 y.o. female who presents to the Emergency Department complaining of intermittent, moderate left side pain which began 4 days s/p fall 6 days ago. Pt reports that she tripped going up her steps and felt pain along her left side two days ago after. Pt states pain is exacerbated with movement. Pt has taken Butalbital and Tylenol with no relief. Patient denies any additional injuries. Patient denies cough.   PCP: Eddie Candle Nephrologist: Deterding, J  Past Medical History  Diagnosis Date  . Hypertension   . High cholesterol   . Heart murmur   . Type II diabetes mellitus ([place])   . Anginal pain ([place])     "often; usually when thyroid goes out of wack and heart starts fluttering" (06/18/2013)  . Dysrhythmia     "palpitation-type feelings; related to thyroid" (06/18/2013)  . Carotid stenosis ~ 01/2013    "bilaterally; ~ 50%; dx'd/carotid US" (06/18/2013)  . Hypothyroidism   . Pneumonia ?1980's; 09/2011    "twice" (06/18/2013)  . Exertional shortness of breath   . Sleep apnea     "have mask; haven't worn it in ~ 1 yr" (06/18/2013)  . Anemia     "as a kid" (06/18/2013)  . GERD (gastroesophageal reflux disease)   . [id number])     "monthly" (06/18/2013)  . Arthritis     "joints probably" (101/27/2014)  . Gout   . Membranous glomerulonephritis   . Hepatitis, unspecified 1972    "husband had hepatitis; my mother, daughter, and me all had to get the gammaglobulin shots" (06/18/2013)   Past Surgical History  Procedure Laterality Date  . Cesarean  section  1971; 1977  . Shoulder arthroscopy w/ rotator cuff repair Right 2009  . Bunionectomy Bilateral [id number]    "once on each side" (06/18/2013)  . Cholecystectomy  ?1998  . Renal biopsy Left ~ 1997; 06/18/2013  . Tonsillectomy    . Colonoscopy N/A 12/03/2015    Procedure: COLONOSCOPY;  Surgeon: Daneil Dolin, MD;  Location: [facility];  Service: Endoscopy;  Laterality: N/A;  8:30 AM   Family History  Problem Relation Age of Onset  . Cirrhosis Mother 43    Non-alcoholic related  . Coronary artery disease Father    Social History  Substance Use Topics  . Smoking status: Never Smoker   . Smokeless tobacco: Never Used  . Alcohol Use: No   OB History    No data available     Review of Systems A complete 10 system review of systems was obtained and all systems are negative except as noted in the HPI and PMH.   Allergies  Codeine; Ibuprofen; Nsaids; Shellfish allergy; and Septra ds  Home Medications   Prior to Admission medications   Medication Sig Start Date End Date Taking? Authorizing Provider  allopurinol (ZYLOPRIM) 300 MG tablet Take 300 mg by mouth daily.    Historical Provider, MD  aspirin 81 MG tablet Take 162 mg by mouth daily.     Historical Provider, MD  cetirizine (ZYRTEC)  10 MG tablet Take 10 mg by mouth at bedtime.    Historical Provider, MD  dexlansoprazole (DEXILANT) 60 MG capsule Take 60 mg by mouth every morning.     Historical Provider, MD  diltiazem (CARDIZEM CD) 240 MG 24 hr capsule Take 240 mg by mouth at bedtime.    Historical Provider, MD  fenofibrate (TRICOR) 145 MG tablet Take 145 mg by mouth at bedtime.     Historical Provider, MD  furosemide (LASIX) 40 MG tablet Take 40 mg by mouth daily.    Historical Provider, MD  insulin aspart (NOVOLOG) 100 UNIT/ML injection Inject 3-8 Units into the skin 3 (three) times daily before meals. On sliding scale    Historical Provider, MD  insulin detemir (LEVEMIR) 100 UNIT/ML injection Inject 15 Units into the  skin at bedtime.    Historical Provider, MD  insulin glargine (LANTUS) 100 UNIT/ML injection Inject 15 Units into the skin at bedtime.    Historical Provider, MD  metoprolol succinate (TOPROL-XL) 100 MG 24 hr tablet Take 1 tablet (100 mg total) by mouth daily. Take with or immediately following a meal. 01/08/16   Troy Sine, MD  Multiple Vitamin (MULTIVITAMIN WITH MINERALS) TABS tablet Take 1 tablet by mouth daily. Reported on 10/28/2015    Historical Provider, MD  Nutritional Supplements (ESTROVEN) TABS Take 1 tablet by mouth at bedtime. Reported on 10/28/2015    Historical Provider, MD  Omega-3 Fatty Acids (FISH OIL) 1200 MG CAPS Take 2 capsules by mouth every morning. Reported on 10/28/2015    Historical Provider, MD  oxyCODONE-acetaminophen (PERCOCET) 5-325 MG tablet Take 1-2 tablets by mouth every 4 (four) hours as needed. 02/08/16   Nat Christen, MD  rOPINIRole (REQUIP) 0.5 MG tablet Take 1 mg by mouth at bedtime.     Historical Provider, MD  rosuvastatin (CRESTOR) 40 MG tablet Take 40 mg by mouth at bedtime.    Historical Provider, MD  SYNTHROID 200 MCG tablet Take 225 mcg by mouth daily.  05/15/15   Historical Provider, MD  SYNTHROID 25 MCG tablet Take 25 mcg by mouth daily. 05/25/15   Historical Provider, MD  TEKTURNA 150 MG tablet Take 1 tablet by mouth daily. 05/16/15   Historical Provider, MD  telmisartan (MICARDIS) 80 MG tablet Take 160 mg by mouth at bedtime.     Historical Provider, MD   BP 155/76 mmHg  Pulse 88  Temp(Src) 98.7 F (37.1 C) (Oral)  Resp 16  Ht 5\' 1"  (1.549 m)  Wt 150 lb (68.04 kg)  BMI 28.36 kg/m2  SpO2 100% Physical Exam  Constitutional: She is oriented to person, place, and time. She appears well-developed and well-nourished.  HENT:  Head: Normocephalic and atraumatic.  Eyes: Conjunctivae and EOM are normal. Pupils are equal, round, and reactive to light.  Neck: Normal range of motion. Neck supple.  Cardiovascular: Normal rate and regular rhythm.    Pulmonary/Chest: Effort normal and breath sounds normal.  Abdominal: Soft. Bowel sounds are normal.  Musculoskeletal: Normal range of motion.  Tender on left lateral chest wall most prominently on T6-T8 area.  Neurological: She is alert and oriented to person, place, and time.  Skin: Skin is warm and dry.  Psychiatric: She has a normal mood and affect. Her behavior is normal.  Nursing note and vitals reviewed.   ED Course  Procedures (including critical care time) DIAGNOSTIC STUDIES: Oxygen Saturation is 100% on RA, normal by my interpretation.    COORDINATION OF CARE: 9:44 AM Discussed treatment plan with  pt at bedside which includes Rx of pain medication and pt agreed to plan.  Labs Review Labs Reviewed - No data to display  Imaging Review Dg Ribs Unilateral W/chest Left  02/08/2016  CLINICAL DATA:  Status post fall with chest pain. EXAM: LEFT RIBS AND CHEST - 3+ VIEW COMPARISON:  11/30/2015 FINDINGS: No fracture or other bone lesions are seen involving the ribs. There is no evidence of pneumothorax or pleural effusion. Heart size and mediastinal contours are within normal limits. There are low lung volumes with bibasilar peribronchovascular linear opacities which may represent atelectasis. IMPRESSION: No evidence of displaced rib fractures. Low lung volumes with bibasilar peribronchovascular linear opacities which may represent atelectasis. In the appropriate clinical setting early bronchopneumonia may have a similar appearance. Electronically Signed   By: Fidela Salisbury M.D.   On: 02/08/2016 09:25   I have personally reviewed and evaluated these images and lab results as part of my medical decision-making.   EKG Interpretation None      MDM   Final diagnoses:  Fall, initial encounter  Chest wall contusion, left, initial encounter    Patient is in no acute distress.   Left rib films negative for fracture. Rx Percocet.  I personally performed the services described  in this documentation, which was scribed in my presence. The recorded information has been reviewed and is accurate.    Nat Christen, MD 02/08/16 [place], MD 02/08/16 1043

## 2016-02-08 NOTE — ED Notes (Signed)
Pt states she tripped going up the steps about a week ago and is c/o left rib pain.

## 2016-08-23 ENCOUNTER — Other Ambulatory Visit: Payer: Self-pay | Admitting: Cardiovascular Disease

## 2016-08-24 NOTE — Telephone Encounter (Signed)
Rx has been sent to the pharmacy electronically. ° °

## 2016-09-02 ENCOUNTER — Other Ambulatory Visit (HOSPITAL_COMMUNITY): Payer: Self-pay | Admitting: Family Medicine

## 2016-09-02 DIAGNOSIS — Z1231 Encounter for screening mammogram for malignant neoplasm of breast: Secondary | ICD-10-CM

## 2016-10-08 ENCOUNTER — Ambulatory Visit (HOSPITAL_COMMUNITY): Payer: 59

## 2016-10-11 ENCOUNTER — Ambulatory Visit (HOSPITAL_COMMUNITY)
Admission: RE | Admit: 2016-10-11 | Discharge: 2016-10-11 | Disposition: A | Discharge status: Home / Self Care | Payer: 59 | Source: Ambulatory Visit | Attending: Family Medicine | Admitting: Family Medicine

## 2016-10-11 ENCOUNTER — Encounter (HOSPITAL_COMMUNITY): Payer: Self-pay

## 2016-10-11 DIAGNOSIS — Z1231 Encounter for screening mammogram for malignant neoplasm of breast: Secondary | ICD-10-CM

## 2016-10-19 ENCOUNTER — Encounter: Payer: Self-pay | Admitting: Nurse Practitioner

## 2016-11-08 ENCOUNTER — Ambulatory Visit: Payer: 59 | Admitting: Nurse Practitioner

## 2016-11-21 ENCOUNTER — Other Ambulatory Visit: Payer: Self-pay | Admitting: Cardiovascular Disease

## 2016-12-26 ENCOUNTER — Other Ambulatory Visit: Payer: Self-pay | Admitting: Cardiovascular Disease

## 2016-12-27 NOTE — Telephone Encounter (Signed)
NEEDS APPOINTMENT FOR ANY FURTHER REFILLS

## 2017-01-13 ENCOUNTER — Telehealth: Payer: Self-pay | Admitting: Cardiovascular Disease

## 2017-01-13 NOTE — Telephone Encounter (Signed)
01/13/2017 Received faxed medical records from [facility] for upcoming appointment with Dr. Claiborne Billings on 01/19/2017.  Records given to [facility].  cbr

## 2017-01-19 ENCOUNTER — Ambulatory Visit (INDEPENDENT_AMBULATORY_CARE_PROVIDER_SITE_OTHER): Payer: 59 | Admitting: Cardiovascular Disease

## 2017-01-19 ENCOUNTER — Encounter: Payer: Self-pay | Admitting: Cardiovascular Disease

## 2017-01-19 VITALS — BP 134/72 | HR 57 | Ht 61.0 in | Wt 150.6 lb

## 2017-01-19 DIAGNOSIS — I10 Essential (primary) hypertension: Secondary | ICD-10-CM | POA: Diagnosis not present

## 2017-01-19 DIAGNOSIS — N052 Unspecified nephritic syndrome with diffuse membranous glomerulonephritis: Secondary | ICD-10-CM | POA: Diagnosis not present

## 2017-01-19 DIAGNOSIS — E039 Hypothyroidism, unspecified: Secondary | ICD-10-CM

## 2017-01-19 DIAGNOSIS — E118 Type 2 diabetes mellitus with unspecified complications: Secondary | ICD-10-CM | POA: Diagnosis not present

## 2017-01-19 DIAGNOSIS — R002 Palpitations: Secondary | ICD-10-CM

## 2017-01-19 DIAGNOSIS — G4733 Obstructive sleep apnea (adult) (pediatric): Secondary | ICD-10-CM | POA: Diagnosis not present

## 2017-01-19 NOTE — Patient Instructions (Signed)
Your physician recommends that you restart the use of your CPAP machine and have [facility] get a download in 1 month.  Your physician recommends that you schedule a follow-up appointment in 6 months.

## 2017-01-19 NOTE — Progress Notes (Signed)
Patient ID: [name], female   DOB: April 23, 1951, 66 y.o.   MRN: [id number]    Primary M.D.: [name] Primary nephrologist: [name]  HPI: [name] is a 66 y.o. female who presents for a 74 month cardiology evaluation.  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 She also has a history of hypertension, hypothyroidism,  DM2, GERD as well as obstructive sleep apnea and has only  intermittently utilized CPAP therapy. Her OSA is moderate with an AHI of 20.9 but severe during REM sleep with an AHI of 54.3 which was noted on her initial sleep study in 2009.  She also has a history of hyperlipidemia and has been on combination therapy with fenofibrate and Crestor.  She underwent another kidney biopsy in October 2014.  This did not show any significant pathologic change.  She has been on direct renin inhibition and ARB therapy in attempt to reduce protein excretion.  She tells recently, her proteinuria has increased to 10 grams. She has a history of hypothyroidism and is on thyroid replacement.   She has a history of hyperlipidemia and has been on combination Crestor and fenofibrate.  In addition to her tekturna and micardis she also has been taking Lasix 40 mg daily, Toprol-XL 50 mg and Cardizem 240 mg for additional blood pressure control.  She is diabetic on Levemir insulin.  She no longer is taking Tradjenta.  She now sees [name] for her endocrinologic care.  She denies chest pain brought on with exertion.  She admits to ankle swelling.  He is followed by [name] dating for renal disease.  Her creatinine had risen up to 2.4 but most recently this had improved to 2.0.  She was started on a trial of cyclosporine, but she did not tolerate this.  She had experienced some rare to occasional palpitations.  This seemed to occur when she was on levothyroxine at 225 g.  Her symptoms  improved with dose reduction down to 200 g.  She has a history of obstructive sleep apnea but states that she has not been using CPAP therapy.  She uses a mouthguard for TMJ, and because of the mouthguard.  She does not feel that she has 10 use her mask.  Her sleep is not good.  Currently she is waking up every 2 hours for nocturia.  She presents for evaluation.  Past Medical History:  Diagnosis Date  . Anemia    "as a kid" (06/18/2013)  . Anginal pain ([place])    "often; usually when thyroid goes out of wack and heart starts fluttering" (06/18/2013)  . Arthritis    "joints probably" (101/27/2014)  . Carotid stenosis ~ 01/2013   "bilaterally; ~ 50%; dx'd/carotid US" (06/18/2013)  . Dysrhythmia    "palpitation-type feelings; related to thyroid" (06/18/2013)  . Exertional shortness of breath   . GERD (gastroesophageal reflux disease)   . Gout   . XIPJASNK(539.7)    "monthly" (06/18/2013)  . Heart murmur   . Hepatitis, unspecified 1972   "husband had hepatitis; my mother, daughter, and me all had to get the gammaglobulin shots" (06/18/2013)  . High cholesterol   . Hypertension   . Hypothyroidism   . Membranous glomerulonephritis   . Pneumonia ?1980's; 09/2011   "twice" (06/18/2013)  . Sleep apnea    "have mask; haven't worn it in ~ 1 yr" (06/18/2013)  . Type II diabetes mellitus (  [facility])     Past Surgical History:  Procedure Laterality Date  . BUNIONECTOMY Bilateral [id number]   "once on each side" (06/18/2013)  . CESAREAN SECTION  1971; 1977  . CHOLECYSTECTOMY  ?1998  . COLONOSCOPY N/A 12/03/2015   Procedure: COLONOSCOPY;  Surgeon: Daneil Dolin, MD;  Location: [facility];  Service: Endoscopy;  Laterality: N/A;  8:30 AM  . RENAL BIOPSY Left ~ 1997; 06/18/2013  . SHOULDER ARTHROSCOPY W/ ROTATOR CUFF REPAIR Right 2009  . TONSILLECTOMY      Allergies  Allergen Reactions  . Codeine Nausea And Vomiting  . Ibuprofen Other (See Comments)    Per kidney MD.  . Nsaids Other (See  Comments)    Per Kidney DR. No NSAIDS  . Shellfish Allergy Other (See Comments)    REACTION: Gout Flare-ups   . Septra Ds [Sulfamethoxazole-Trimethoprim] Itching and Rash    Current Outpatient Prescriptions  Medication Sig Dispense Refill  . allopurinol (ZYLOPRIM) 300 MG tablet Take 300 mg by mouth daily.    Marland Kitchen aspirin 81 MG tablet Take 162 mg by mouth daily.     . cetirizine (ZYRTEC) 10 MG tablet Take 10 mg by mouth at bedtime.    Marland Kitchen diltiazem (CARDIZEM CD) 240 MG 24 hr capsule Take 240 mg by mouth at bedtime.    . fenofibrate (TRICOR) 145 MG tablet Take 145 mg by mouth at bedtime.     . furosemide (LASIX) 40 MG tablet Take 40 mg by mouth daily.    . insulin aspart (NOVOLOG) 100 UNIT/ML injection Inject 3-8 Units into the skin 3 (three) times daily before meals. On sliding scale    . insulin detemir (LEVEMIR) 100 UNIT/ML injection Inject 15 Units into the skin daily.     . metoprolol succinate (TOPROL-XL) 100 MG 24 hr tablet take 1 tablet by mouth once daily with food 30 tablet 0  . Multiple Vitamin (MULTIVITAMIN WITH MINERALS) TABS tablet Take 1 tablet by mouth daily. Reported on 10/28/2015    . oxyCODONE-acetaminophen (PERCOCET) 5-325 MG tablet Take 1-2 tablets by mouth every 4 (four) hours as needed. 20 tablet 0  . ranitidine (ZANTAC) 150 MG tablet Take 150 mg by mouth 2 (two) times daily.    Marland Kitchen rOPINIRole (REQUIP) 0.5 MG tablet Take 1 mg by mouth at bedtime.     . rosuvastatin (CRESTOR) 40 MG tablet Take 40 mg by mouth at bedtime.    Marland Kitchen SYNTHROID 200 MCG tablet Take 200 mcg by mouth daily.   0  . telmisartan (MICARDIS) 80 MG tablet Take 160 mg by mouth at bedtime.      No current facility-administered medications for this visit.     Social History   Social History  . Marital status: Married    Spouse name: N/A  . Number of children: N/A  . Years of education: N/A   Occupational History  . Not on file.   Social History Main Topics  . Smoking status: Never Smoker  . Smokeless  tobacco: Never Used  . Alcohol use No  . Drug use: No  . Sexual activity: No   Other Topics Concern  . Not on file   Social History Narrative  . No narrative on file   Socially, she has just retired.  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  ROS General: Negative; No fevers, chills, or night sweats;  HEENT: Negative; No changes in vision or hearing, sinus congestion, difficulty swallowing Pulmonary: Negative;  No cough, wheezing, shortness of breath, hemoptysis Cardiovascular: Negative; No chest pain, presyncope, syncope, palpitations Positive for occasional edema GI: Negative; No nausea, vomiting, diarrhea, or abdominal pain GU: Negative; No dysuria, hematuria, or difficulty voiding Renal: Positive for membranous glomerulonephropathy with nephrotic proteinuria Musculoskeletal: Positive for TMJ; no myalgias, joint pain, or weakness Hematologic/Oncology: Negative; no easy bruising, bleeding Endocrine: Negative; no heat/cold intolerance; no diabetes Neuro: Negative; no changes in balance, headaches Skin: Negative; No rashes or skin lesions Psychiatric: Negative; No behavioral problems, depression Sleep: Positive for OSA; Not using CPAP;  Other comprehensive 14 point system review is negative.  PE BP 134/72   Pulse (!) 57   Ht '5\' 1"'  (1.549 m)   Wt 150 lb 9.6 oz (68.3 kg)   BMI 28.46 kg/m    Repeat blood pressure by me was 126/80.  Wt Readings from Last 3 Encounters:  01/19/17 150 lb 9.6 oz (68.3 kg)  02/08/16 150 lb (68 kg)  01/08/16 150 lb (68 kg)   General: Alert, oriented, no distress.  Skin: normal turgor, no rashes, warm and dry HEENT: Normocephalic, atraumatic. Pupils equal round and reactive to light; sclera anicteric; extraocular muscles intact; Nose without nasal septal hypertrophy Mouth/Parynx benign; Mallinpatti scale 3 Neck: No JVD, no carotid bruits; normal carotid upstroke Lungs: clear to ausculatation and percussion;  no wheezing or rales Chest wall: without tenderness to palpitation Heart: PMI not displaced, RRR, s1 s2 normal, 1/6 systolic murmur, no diastolic murmur, no rubs, gallops, thrills, or heaves Abdomen: soft, nontender; no hepatosplenomehaly, BS+; abdominal aorta nontender and not dilated by palpation. Back: no CVA tenderness Pulses 2+ Musculoskeletal: full range of motion, normal strength, no joint deformities Extremities: Trace ankle swelling no clubbing cyanosis , Homan's sign negative  Neurologic: grossly nonfocal; Cranial nerves grossly wnl Psychologic: Normal mood and affect  ECG (independently read by me): Sinus bradycardia 57 bpm.  PR interval 146 and QTc interval 447 ms.  No ectopy.  May 2017 ECG (independently read by me): Normal sinus rhythm at 75 bpm.  LVH by voltage criteria.  No significant ST changes.  QTc interval 460 ms.  ECG (independently read by me): Sinus rhythm at 99 bpm.  Left axis deviation.  Poor progression anteriorly.  August 2015 ECG (independently read by me): Normal sinus rhythm at 70 beats per minute.  QTc interval 423 ms.  PR interval normal at 150 ms.  Prior ECG: Normal sinus rhythm at 92 beats per minute. Poor R wave progression.  LABS: BMP Latest Ref Rng & Units 08/21/2014 06/18/2013 10/29/2012  Glucose 70 - 99 mg/dL - 125(H) 195(H)  BUN 6 - 23 mg/dL - 24(H) 14  Creatinine 0.50 - 1.10 mg/dL 1.30(H) 1.08 1.06  Sodium 135 - 145 mEq/L - 139 140  Potassium 3.5 - 5.1 mEq/L - 4.2 4.1  Chloride 96 - 112 mEq/L - 104 103  CO2 19 - 32 mEq/L - 25 25  Calcium 8.4 - 10.5 mg/dL - 10.3 10.4   Hepatic Function Latest Ref Rng & Units 11/07/2011  Total Protein 6.0 - 8.3 g/dL 6.6  Albumin 3.5 - 5.2 g/dL 3.3(L)  AST 0 - 37 U/L 36  ALT 0 - 35 U/L 52(H)  Alk Phosphatase 39 - 117 U/L 43  Total Bilirubin 0.3 - 1.2 mg/dL 0.3    CBC Latest Ref Rng & Units 06/19/2013 06/18/2013 06/18/2013  WBC 4.0 - 10.5 K/uL 10.6(H) 9.4 9.5  Hemoglobin 12.0 - 15.0 g/dL 11.6(L) 11.6(L)  12.7  Hematocrit 36.0 - 46.0 % 33.9(L)  34.1(L) 36.9  Platelets 150 - 400 K/uL 279 293 306   . Lab Results  Component Value Date   MCV 92.6 06/19/2013   MCV 92.4 06/18/2013   MCV 92.3 06/18/2013   No results found for: TSH No results found for: HGBA1C   Lipid Panel  No results found for: CHOL, TRIG, HDL, CHOLHDL, VLDL, LDLCALC, LDLDIRECT   RADIOLOGY: No results found.  IMPRESSION:  1. Essential hypertension   2. OSA (obstructive sleep apnea)   3. Membranous glomerulonephritis   4. DM (diabetes mellitus) with complications ([place])   5. Hypothyroidism, unspecified type   6. Palpitations     ASSESSMENT AND PLAN: Ms Cerrato is a 66 year old female who has a history of hypertension, hypothyroidism, GERD, obstructive sleep apnea as well as her nephrotic range proteinuria due to membranous glomerulonephritis.  She had been on combination therapy with  micardis 160 mg and tekturna 150 mg , both for blood pressure and her proteinuria.  She recently was given a trial with cyclosporine, but did not tolerate this with worsening renal function.  She is now on Micardis 160 mg, Cardizem CD 240 mg, Toprol 100 mg and furosemide 40 mg for her hypertension.  She is no longer on Tekturna.  She had experienced palpitations but these have improved with dose reduction of her Synthroid.  A long discussion with her and have strongly recommended reinstitution of CPAP therapy.  I explained to her the physiology of why she is waking up every 2 hours for urination since her sleep apnea is untreated.  We discussed new mask technology and that at present, I would recommend CPAP over her mouth guard .  If she only had one choice to make.  She is not having any chest pain.  She is diabetic on insulin.  She is on requip for restless legs.  I will recheck a CPAP download in one month.  I will see her in 6 months for cardiology evaluation.  Time spent: 25 minutes  Troy Sine, MD, [facility]  01/21/2017 7:23 PM

## 2017-01-23 ENCOUNTER — Other Ambulatory Visit: Payer: Self-pay | Admitting: Cardiovascular Disease

## 2017-01-24 NOTE — Telephone Encounter (Signed)
Rx has been sent to the pharmacy electronically. ° °

## 2017-03-03 ENCOUNTER — Telehealth: Payer: Self-pay | Admitting: *Deleted

## 2017-03-03 NOTE — Telephone Encounter (Signed)
Patient notified download shows compliance, however she needs to increase the daily usage. Patient voiced understanding. Last office note and prescription faxed to [facility] for patient to get new supplies.

## 2017-03-09 ENCOUNTER — Other Ambulatory Visit: Payer: Self-pay | Admitting: Cardiovascular Disease

## 2017-03-21 DIAGNOSIS — E119 Type 2 diabetes mellitus without complications: Secondary | ICD-10-CM | POA: Diagnosis not present

## 2017-03-24 DIAGNOSIS — E1129 Type 2 diabetes mellitus with other diabetic kidney complication: Secondary | ICD-10-CM | POA: Diagnosis not present

## 2017-03-24 DIAGNOSIS — E1165 Type 2 diabetes mellitus with hyperglycemia: Secondary | ICD-10-CM | POA: Diagnosis not present

## 2017-03-24 DIAGNOSIS — E039 Hypothyroidism, unspecified: Secondary | ICD-10-CM | POA: Diagnosis not present

## 2017-03-24 DIAGNOSIS — E782 Mixed hyperlipidemia: Secondary | ICD-10-CM | POA: Diagnosis not present

## 2017-03-31 DIAGNOSIS — E7800 Pure hypercholesterolemia, unspecified: Secondary | ICD-10-CM | POA: Diagnosis not present

## 2017-03-31 DIAGNOSIS — I10 Essential (primary) hypertension: Secondary | ICD-10-CM | POA: Diagnosis not present

## 2017-03-31 DIAGNOSIS — E1165 Type 2 diabetes mellitus with hyperglycemia: Secondary | ICD-10-CM | POA: Diagnosis not present

## 2017-03-31 DIAGNOSIS — N183 Chronic kidney disease, stage 3 (moderate): Secondary | ICD-10-CM | POA: Diagnosis not present

## 2017-05-02 DIAGNOSIS — G43719 Chronic migraine without aura, intractable, without status migrainosus: Secondary | ICD-10-CM | POA: Diagnosis not present

## 2017-05-10 DIAGNOSIS — H2513 Age-related nuclear cataract, bilateral: Secondary | ICD-10-CM | POA: Diagnosis not present

## 2017-05-10 DIAGNOSIS — E119 Type 2 diabetes mellitus without complications: Secondary | ICD-10-CM | POA: Diagnosis not present

## 2017-05-10 DIAGNOSIS — Z794 Long term (current) use of insulin: Secondary | ICD-10-CM | POA: Diagnosis not present

## 2017-06-10 DIAGNOSIS — R911 Solitary pulmonary nodule: Secondary | ICD-10-CM | POA: Diagnosis not present

## 2017-06-10 DIAGNOSIS — N052 Unspecified nephritic syndrome with diffuse membranous glomerulonephritis: Secondary | ICD-10-CM | POA: Diagnosis not present

## 2017-06-10 DIAGNOSIS — N183 Chronic kidney disease, stage 3 (moderate): Secondary | ICD-10-CM | POA: Diagnosis not present

## 2017-06-10 DIAGNOSIS — Z23 Encounter for immunization: Secondary | ICD-10-CM | POA: Diagnosis not present

## 2017-06-10 DIAGNOSIS — I129 Hypertensive chronic kidney disease with stage 1 through stage 4 chronic kidney disease, or unspecified chronic kidney disease: Secondary | ICD-10-CM | POA: Diagnosis not present

## 2017-06-10 DIAGNOSIS — E782 Mixed hyperlipidemia: Secondary | ICD-10-CM | POA: Diagnosis not present

## 2017-06-10 DIAGNOSIS — E039 Hypothyroidism, unspecified: Secondary | ICD-10-CM | POA: Diagnosis not present

## 2017-06-10 DIAGNOSIS — M1A30X0 Chronic gout due to renal impairment, unspecified site, without tophus (tophi): Secondary | ICD-10-CM | POA: Diagnosis not present

## 2017-06-10 DIAGNOSIS — R809 Proteinuria, unspecified: Secondary | ICD-10-CM | POA: Diagnosis not present

## 2017-06-10 DIAGNOSIS — E118 Type 2 diabetes mellitus with unspecified complications: Secondary | ICD-10-CM | POA: Diagnosis not present

## 2017-06-10 DIAGNOSIS — N2581 Secondary hyperparathyroidism of renal origin: Secondary | ICD-10-CM | POA: Diagnosis not present

## 2017-06-10 DIAGNOSIS — D631 Anemia in chronic kidney disease: Secondary | ICD-10-CM | POA: Diagnosis not present

## 2017-06-15 ENCOUNTER — Telehealth: Payer: Self-pay | Admitting: Cardiovascular Disease

## 2017-06-15 NOTE — Telephone Encounter (Signed)
06/15/2017 Received faxed medical records from [facility] for upcoming appointment with Dr. Claiborne Billings on 07/19/2017.  Records given to [facility].  cbr

## 2017-07-19 ENCOUNTER — Encounter: Payer: Self-pay | Admitting: Cardiovascular Disease

## 2017-07-19 ENCOUNTER — Ambulatory Visit (INDEPENDENT_AMBULATORY_CARE_PROVIDER_SITE_OTHER): Payer: Medicare Other | Admitting: Cardiovascular Disease

## 2017-07-19 VITALS — BP 154/86 | HR 62 | Ht 63.0 in | Wt 159.4 lb

## 2017-07-19 DIAGNOSIS — G4733 Obstructive sleep apnea (adult) (pediatric): Secondary | ICD-10-CM | POA: Diagnosis not present

## 2017-07-19 DIAGNOSIS — N052 Unspecified nephritic syndrome with diffuse membranous glomerulonephritis: Secondary | ICD-10-CM | POA: Diagnosis not present

## 2017-07-19 DIAGNOSIS — E039 Hypothyroidism, unspecified: Secondary | ICD-10-CM

## 2017-07-19 DIAGNOSIS — E118 Type 2 diabetes mellitus with unspecified complications: Secondary | ICD-10-CM

## 2017-07-19 DIAGNOSIS — I10 Essential (primary) hypertension: Secondary | ICD-10-CM | POA: Diagnosis not present

## 2017-07-19 DIAGNOSIS — E785 Hyperlipidemia, unspecified: Secondary | ICD-10-CM | POA: Diagnosis not present

## 2017-07-19 MED ORDER — AMLODIPINE BESYLATE 2.5 MG PO TABS: 2.5000 mg | ORAL_TABLET | Freq: Every day | ORAL | 3 refills | Status: DC

## 2017-07-19 NOTE — Progress Notes (Addendum)
Patient ID: [name], female   DOB: 31-Jul-1951, 66 y.o.   MRN: [id number]    Primary M.D.: Dr. Sharilyn Sites Primary nephrologist: Dr. Jimmy Footman  HPI: [name] is a 66 y.o. female who presents for a 6 month cardiology evaluation.  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 Jimmy Footman. She also has a history of hypertension, hypothyroidism,  DM2, GERD as well as obstructive sleep apnea and has only  intermittently utilized CPAP therapy. [name] OSA is moderate with an AHI of 20.9 but severe during REM sleep with an AHI of 54.3 which was noted on [name] initial sleep study in 2009.  She also has a history of hyperlipidemia and has been on combination therapy with fenofibrate and Crestor.  She underwent another kidney biopsy in October 2014.  This did not show any significant pathologic change.  She has been on direct renin inhibition and ARB therapy in attempt to reduce protein excretion.  She tells recently, [name] proteinuria has increased to 10 grams. She has a history of hypothyroidism and is on thyroid replacement.   She has a history of hyperlipidemia and has been on combination Crestor and fenofibrate.  In addition to [name] tekturna and micardis she also has been taking Lasix 40 mg daily, Toprol-XL 50 mg and Cardizem 240 mg for additional blood pressure control.  She is diabetic on Levemir insulin.  She no longer is taking Tradjenta.  She now sees Dr. Suzette Battiest for [name] endocrinologic care.  She denies chest pain brought on with exertion.  She admits to ankle swelling.  He is followed by Dr. Reinaldo Meeker dating for renal disease.  [name] creatinine had risen up to 2.4 but most recently this had improved to 2.0.  She was started on a trial of cyclosporine, but she did not tolerate this.  She had experienced some rare to occasional palpitations.  This seemed to occur when she was on levothyroxine at 225 g.  [name] symptoms  improved with dose reduction down to 200 g.  [name] Synthroid dose was further reduced to 175 g.  In August 2018, TSH was 0.08.  Since I last saw [name], she has been followed by Dr. Jimmy Footman for [name] nephrotic range proteinuria.  She tells me [name] recent protein to creatinine ratio was 20,927.  She was supposed to be starting Cytoxan and prednisone but apparently due to cost never initiated therapy.  She denies recent chest pain.  She denies change in shortness of breath.  There are no palpitations.  She presents for reevaluation.  Past Medical History:  Diagnosis Date  . Anemia    "as a kid" (06/18/2013)  . Anginal pain ([place])    "often; usually when thyroid goes out of wack and heart starts fluttering" (06/18/2013)  . Arthritis    "joints probably" (101/27/2014)  . Carotid stenosis ~ 01/2013   "bilaterally; ~ 50%; dx'd/carotid US" (06/18/2013)  . Dysrhythmia    "palpitation-type feelings; related to thyroid" (06/18/2013)  . Exertional shortness of breath   . GERD (gastroesophageal reflux disease)   . Gout   . HRCBULAG(536.4)    "monthly" (06/18/2013)  . Heart murmur   . Hepatitis, unspecified 1972   "husband had hepatitis; my mother, daughter, and me all had to get the gammaglobulin shots" (06/18/2013)  . High cholesterol   . Hypertension   . Hypothyroidism   . Membranous glomerulonephritis   . Pneumonia ?1980's; 09/2011   "twice" (  06/18/2013)  . Sleep apnea    "have mask; haven't worn it in ~ 1 yr" (06/18/2013)  . Type II diabetes mellitus ([place])     Past Surgical History:  Procedure Laterality Date  . BUNIONECTOMY Bilateral [id number]   "once on each side" (06/18/2013)  . CESAREAN SECTION  1971; 1977  . CHOLECYSTECTOMY  ?1998  . COLONOSCOPY N/A 12/03/2015   Procedure: COLONOSCOPY;  Surgeon: Daneil Dolin, MD;  Location: [facility];  Service: Endoscopy;  Laterality: N/A;  8:30 AM  . RENAL BIOPSY Left ~ 1997; 06/18/2013  . SHOULDER ARTHROSCOPY W/ ROTATOR CUFF REPAIR Right 2009   . TONSILLECTOMY      Allergies  Allergen Reactions  . Codeine Nausea And Vomiting  . Ibuprofen Other (See Comments)    Per kidney MD.  . Nsaids Other (See Comments)    Per Kidney DR. No NSAIDS  . Shellfish Allergy Other (See Comments)    REACTION: Gout Flare-ups   . Septra Ds [Sulfamethoxazole-Trimethoprim] Itching and Rash    Current Outpatient Medications  Medication Sig Dispense Refill  . allopurinol (ZYLOPRIM) 300 MG tablet Take 300 mg by mouth daily.    Marland Kitchen aspirin 81 MG tablet Take 162 mg by mouth daily.     . cetirizine (ZYRTEC) 10 MG tablet Take 10 mg by mouth at bedtime.    Marland Kitchen DEXILANT 30 MG capsule Take 30 mg by mouth daily.  11  . diltiazem (CARDIZEM CD) 240 MG 24 hr capsule Take 240 mg by mouth at bedtime.    . fenofibrate (TRICOR) 145 MG tablet Take 145 mg by mouth at bedtime.     . furosemide (LASIX) 40 MG tablet Take 40 mg by mouth daily.    . insulin aspart (NOVOLOG) 100 UNIT/ML injection Inject 3-8 Units into the skin 3 (three) times daily before meals. On sliding scale    . insulin detemir (LEVEMIR) 100 UNIT/ML injection Inject 30 Units into the skin daily.     . metoprolol succinate (TOPROL-XL) 100 MG 24 hr tablet take 1 tablet by mouth once daily with food 90 tablet 2  . Multiple Vitamin (MULTIVITAMIN WITH MINERALS) TABS tablet Take 1 tablet by mouth daily. Reported on 10/28/2015    . ranitidine (ZANTAC) 150 MG tablet Take 150 mg by mouth 2 (two) times daily.    Marland Kitchen rOPINIRole (REQUIP) 0.5 MG tablet Take 1 mg by mouth at bedtime.     . rosuvastatin (CRESTOR) 40 MG tablet Take 40 mg by mouth at bedtime.    Marland Kitchen SYNTHROID 175 MCG tablet Take 175 mcg by mouth daily.   0  . telmisartan (MICARDIS) 80 MG tablet Take 160 mg by mouth at bedtime.     Marland Kitchen amLODipine (NORVASC) 2.5 MG tablet Take 1 tablet (2.5 mg total) by mouth daily. 90 tablet 3   No current facility-administered medications for this visit.     Social History   Socioeconomic History  . Marital status: Married     Spouse name: Not on file  . Number of children: Not on file  . Years of education: Not on file  . Highest education level: Not on file  Social Needs  . Financial resource strain: Not on file  . Food insecurity - worry: Not on file  . Food insecurity - inability: Not on file  . Transportation needs - medical: Not on file  . Transportation needs - non-medical: Not on file  Occupational History  . Not on file  Tobacco Use  .  Smoking status: Never Smoker  . Smokeless tobacco: Never Used  Substance and Sexual Activity  . Alcohol use: No  . Drug use: No  . Sexual activity: No  Other Topics Concern  . Not on file  Social History Narrative  . Not on file   Socially, she has just retired.  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  ROS General: Negative; No fevers, chills, or night sweats;  HEENT: Negative; No changes in vision or hearing, sinus congestion, difficulty swallowing Pulmonary: Negative; No cough, wheezing, shortness of breath, hemoptysis Cardiovascular: Negative; No chest pain, presyncope, syncope, palpitations Positive for occasional edema GI: Negative; No nausea, vomiting, diarrhea, or abdominal pain GU: Negative; No dysuria, hematuria, or difficulty voiding Renal: Positive for membranous glomerulonephropathy with nephrotic proteinuria Musculoskeletal: Positive for TMJ; no myalgias, joint pain, or weakness Hematologic/Oncology: Negative; no easy bruising, bleeding Endocrine: Negative; no heat/cold intolerance; no diabetes Neuro: Negative; no changes in balance, headaches Skin: Negative; No rashes or skin lesions Psychiatric: Negative; No behavioral problems, depression Sleep: Positive for OSA; Not using CPAP;  Other comprehensive 14 point system review is negative.  PE BP (!) 154/86   Pulse 62   Ht '5\' 3"'  (1.6 m)   Wt 159 lb 6.4 oz (72.3 kg)   BMI 28.24 kg/m   Repeat blood pressure by me was elevated at 155/80  Wt  Readings from Last 3 Encounters:  07/19/17 159 lb 6.4 oz (72.3 kg)  01/19/17 150 lb 9.6 oz (68.3 kg)  02/08/16 150 lb (68 kg)   General: Alert, oriented, no distress.  Skin: normal turgor, no rashes, warm and dry HEENT: Normocephalic, atraumatic. Pupils equal round and reactive to light; sclera anicteric; extraocular muscles intact;  Nose without nasal septal hypertrophy Mouth/Parynx benign; Mallinpatti scale 3 Neck: No JVD, no carotid bruits; normal carotid upstroke Lungs: clear to ausculatation and percussion; no wheezing or rales Chest wall: without tenderness to palpitation Heart: PMI not displaced, RRR, s1 s2 normal, 1/6 systolic murmur, no diastolic murmur, no rubs, gallops, thrills, or heaves Abdomen: soft, nontender; no hepatosplenomehaly, BS+; abdominal aorta nontender and not dilated by palpation. Back: no CVA tenderness Pulses 2+ Musculoskeletal: full range of motion, normal strength, no joint deformities Extremities: no clubbing cyanosis or edema, Homan's sign negative  Neurologic: grossly nonfocal; Cranial nerves grossly wnl Psychologic: Normal mood and affect   ECG (independently read by me): Normal sinus rhythm at 62 bpm.  No syncope.  No CT changes.  QTc interval 456 Katherine  May 2018 ECG (independently read by me): Sinus bradycardia 57 bpm.  PR interval 146 and QTc interval 447 Katherine.  No ectopy.  May 2017 ECG (independently read by me): Normal sinus rhythm at 75 bpm.  LVH by voltage criteria.  No significant ST changes.  QTc interval 460 Katherine.  ECG (independently read by me): Sinus rhythm at 99 bpm.  Left axis deviation.  Poor progression anteriorly.  August 2015 ECG (independently read by me): Normal sinus rhythm at 70 beats per minute.  QTc interval 423 Katherine.  PR interval normal at 150 Katherine.  Prior ECG: Normal sinus rhythm at 92 beats per minute. Poor R wave progression.  LABS: BMP Latest Ref Rng & Units 08/21/2014 06/18/2013 10/29/2012  Glucose 70 - 99 mg/dL - 125(H) 195(H)    BUN 6 - 23 mg/dL - 24(H) 14  Creatinine 0.50 - 1.10 mg/dL 1.30(H) 1.08 1.06  Sodium 135 - 145 mEq/L - 139 140  Potassium 3.5 -  5.1 mEq/L - 4.2 4.1  Chloride 96 - 112 mEq/L - 104 103  CO2 19 - 32 mEq/L - 25 25  Calcium 8.4 - 10.5 mg/dL - 10.3 10.4   Hepatic Function Latest Ref Rng & Units 11/07/2011  Total Protein 6.0 - 8.3 g/dL 6.6  Albumin 3.5 - 5.2 g/dL 3.3(L)  AST 0 - 37 U/L 36  ALT 0 - 35 U/L 52(H)  Alk Phosphatase 39 - 117 U/L 43  Total Bilirubin 0.3 - 1.2 mg/dL 0.3    CBC Latest Ref Rng & Units 06/19/2013 06/18/2013 06/18/2013  WBC 4.0 - 10.5 K/uL 10.6(H) 9.4 9.5  Hemoglobin 12.0 - 15.0 g/dL 11.6(L) 11.6(L) 12.7  Hematocrit 36.0 - 46.0 % 33.9(L) 34.1(L) 36.9  Platelets 150 - 400 K/uL 279 293 306   . Lab Results  Component Value Date   MCV 92.6 06/19/2013   MCV 92.4 06/18/2013   MCV 92.3 06/18/2013   No results found for: TSH No results found for: HGBA1C   Lipid Panel  No results found for: CHOL, TRIG, HDL, CHOLHDL, VLDL, LDLCALC, LDLDIRECT   RADIOLOGY: No results found.  IMPRESSION:  1. Essential hypertension   2. Membranous glomerulonephritis   3. DM (diabetes mellitus) with complications ([place])   4. Hyperlipidemia with target LDL less than 70   5. Hypothyroidism, unspecified type   6. OSA (obstructive sleep apnea)     ASSESSMENT AND PLAN: [name] is a 66 year old female who has a history of hypertension, hypothyroidism, GERD, obstructive sleep apnea, insulin-dependent diabetes mellitus as well as [name] nephrotic range proteinuria due to membranous glomerulonephritis.  She had been on combination therapy with  micardis 160 mg and tekturna 150 mg , both for blood pressure and [name] proteinuria.  She recently was given a trial with cyclosporine, but did not tolerate this with worsening renal function.  She is now on Micardis 160 mg, Cardizem CD 240 mg, Toprol 100 mg and furosemide 40 mg for [name] hypertension.  [name] blood pressure today is elevated and I'm adding  low-dose amlodipine, initially at 2.5 mg for improved blood pressure control.  Since she will be seeing Dr. Jimmy Footman next month.  [name] blood pressure is elevated further titration of medication can be performed.  She continues to have significant proteinuria with a protein to creatinine ratio greater than 20,000.  Apparently she was supposed to start Cytoxan, but due to out-of-pocket cost.  She never started this.  She's now on a reduced dose of levothyroxine at 175 g since previously.  [name] thyroid was over suppressed.  She has a history of obstructive sleep apnea in the past had used a mouth guard and I had recommended resumption of CPAP treatment.  She continues to be on rosuvastatin 40 mg.  Laboratory in April 2018 showed a total cholesterol 125, HDL 39, LDL 42, although triglycerides remain elevated at 218.  She will be seeing Dr. Jimmy Footman for nephrology follow-up as well as Dr. Michiel Sites for endocrinology follow-up.  I will see [name] in 6 months for cardiology reevaluation.  Time spent: 25 minutes  Troy Sine, MD, [facility]  07/19/2017 6:36 PM

## 2017-07-19 NOTE — Patient Instructions (Signed)
Medication Instructions: Start Amlodipine 2.5 mg tablet daily  If you need a refill on your cardiac medications before your next appointment, please call your pharmacy.    Follow-Up: Your physician wants you to follow-up in: 6 months with Dr. Claiborne Billings. You will receive a reminder letter in the mail two months in advance. If you don't receive a letter, please call our office at [phone] to schedule this follow-up appointment.   Thank you for choosing [facility]!!

## 2017-07-28 ENCOUNTER — Telehealth: Payer: Self-pay | Admitting: Cardiovascular Disease

## 2017-07-28 NOTE — Telephone Encounter (Signed)
New message     *STAT* If patient is at the pharmacy, call can be transferred to refill team.   1. Which medications need to be refilled? (please list name of each medication and dose if known)  Nitrostat 0.4mg   2. Which pharmacy/location (including street and city if local pharmacy) is medication to be sent to? [facility]  - [place]   3. Do they need a 30 day or 90 day supply? [place]

## 2017-07-28 NOTE — Telephone Encounter (Signed)
When I look at her med list it does not appear that she is on sublingual nitroglycerin.

## 2017-07-29 NOTE — Telephone Encounter (Signed)
Has the patient recently used supplemental nitroglycerin?  If this is an old prescription.  It must be at least 12-66 years old.  If she has had instances to use, okay to refill but would caution the patient that this medicine will drop her blood pressure.

## 2017-07-29 NOTE — Telephone Encounter (Signed)
Called pt she states that she spoke with the nurse when she was here at lat visit and she was to speak with you about a refill because her's was old and expired so she needs refill. She states that she was rx'd this previously by Dr Mathis Bud she was st the [place] office. Do you want to fill?

## 2017-07-29 NOTE — Telephone Encounter (Signed)
[id number] 

## 2017-07-29 NOTE — Telephone Encounter (Signed)
Nitro is not on pt's past medication list either. Will call pt and discuss later

## 2017-08-05 DIAGNOSIS — E039 Hypothyroidism, unspecified: Secondary | ICD-10-CM | POA: Diagnosis not present

## 2017-08-05 DIAGNOSIS — E7800 Pure hypercholesterolemia, unspecified: Secondary | ICD-10-CM | POA: Diagnosis not present

## 2017-08-05 DIAGNOSIS — E1165 Type 2 diabetes mellitus with hyperglycemia: Secondary | ICD-10-CM | POA: Diagnosis not present

## 2017-08-05 DIAGNOSIS — I10 Essential (primary) hypertension: Secondary | ICD-10-CM | POA: Diagnosis not present

## 2017-08-05 DIAGNOSIS — N183 Chronic kidney disease, stage 3 (moderate): Secondary | ICD-10-CM | POA: Diagnosis not present

## 2017-08-05 NOTE — Telephone Encounter (Signed)
Unable to contact pt-will wait for call back

## 2017-08-08 MED ORDER — NITROGLYCERIN 0.4 MG SL SUBL: 0.4000 mg | SUBLINGUAL_TABLET | SUBLINGUAL | 3 refills | Status: DC | PRN

## 2017-08-08 NOTE — Telephone Encounter (Signed)
RETURNED CALL TO PT SHE STATES THAT SHE WAS HERE FOR APPT 11-27 AND HE TOLD HER HE WOULD REFILL AND STATES THAT HE MUST HAVE FORGOT. WARNED PT THAT THIS WILL LOWER HER BP AND PT ACKNOWLEDGES THIS AND SHE STATES THAT DR Claiborne Billings INFORMED HER OF THIS BEFORE.   REFILL SENT AS REQUESTED

## 2017-09-08 DIAGNOSIS — N183 Chronic kidney disease, stage 3 (moderate): Secondary | ICD-10-CM | POA: Diagnosis not present

## 2017-09-19 DIAGNOSIS — I129 Hypertensive chronic kidney disease with stage 1 through stage 4 chronic kidney disease, or unspecified chronic kidney disease: Secondary | ICD-10-CM | POA: Diagnosis not present

## 2017-09-19 DIAGNOSIS — K760 Fatty (change of) liver, not elsewhere classified: Secondary | ICD-10-CM | POA: Diagnosis not present

## 2017-09-19 DIAGNOSIS — D631 Anemia in chronic kidney disease: Secondary | ICD-10-CM | POA: Diagnosis not present

## 2017-09-19 DIAGNOSIS — E118 Type 2 diabetes mellitus with unspecified complications: Secondary | ICD-10-CM | POA: Diagnosis not present

## 2017-09-19 DIAGNOSIS — R809 Proteinuria, unspecified: Secondary | ICD-10-CM | POA: Diagnosis not present

## 2017-09-19 DIAGNOSIS — M1A30X0 Chronic gout due to renal impairment, unspecified site, without tophus (tophi): Secondary | ICD-10-CM | POA: Diagnosis not present

## 2017-09-19 DIAGNOSIS — N052 Unspecified nephritic syndrome with diffuse membranous glomerulonephritis: Secondary | ICD-10-CM | POA: Diagnosis not present

## 2017-09-19 DIAGNOSIS — E782 Mixed hyperlipidemia: Secondary | ICD-10-CM | POA: Diagnosis not present

## 2017-09-19 DIAGNOSIS — E669 Obesity, unspecified: Secondary | ICD-10-CM | POA: Diagnosis not present

## 2017-09-19 DIAGNOSIS — E039 Hypothyroidism, unspecified: Secondary | ICD-10-CM | POA: Diagnosis not present

## 2017-09-19 DIAGNOSIS — N183 Chronic kidney disease, stage 3 (moderate): Secondary | ICD-10-CM | POA: Diagnosis not present

## 2017-09-19 DIAGNOSIS — N2581 Secondary hyperparathyroidism of renal origin: Secondary | ICD-10-CM | POA: Diagnosis not present

## 2017-09-22 ENCOUNTER — Other Ambulatory Visit (HOSPITAL_COMMUNITY): Payer: Self-pay | Admitting: Family Medicine

## 2017-09-22 DIAGNOSIS — Z1231 Encounter for screening mammogram for malignant neoplasm of breast: Secondary | ICD-10-CM

## 2017-09-29 DIAGNOSIS — N052 Unspecified nephritic syndrome with diffuse membranous glomerulonephritis: Secondary | ICD-10-CM | POA: Diagnosis not present

## 2017-09-30 DIAGNOSIS — Z01419 Encounter for gynecological examination (general) (routine) without abnormal findings: Secondary | ICD-10-CM | POA: Diagnosis not present

## 2017-10-10 ENCOUNTER — Ambulatory Visit (HOSPITAL_COMMUNITY)
Admission: RE | Admit: 2017-10-10 | Discharge: 2017-10-10 | Disposition: A | Discharge status: Home / Self Care | Payer: Medicare Other | Source: Ambulatory Visit | Attending: Family Medicine | Admitting: Family Medicine

## 2017-10-10 ENCOUNTER — Encounter (HOSPITAL_COMMUNITY): Payer: Self-pay

## 2017-10-10 DIAGNOSIS — M5136 Other intervertebral disc degeneration, lumbar region: Secondary | ICD-10-CM | POA: Diagnosis not present

## 2017-10-10 DIAGNOSIS — E6609 Other obesity due to excess calories: Secondary | ICD-10-CM | POA: Diagnosis not present

## 2017-10-10 DIAGNOSIS — Z6831 Body mass index (BMI) 31.0-31.9, adult: Secondary | ICD-10-CM | POA: Diagnosis not present

## 2017-10-10 DIAGNOSIS — M545 Low back pain: Secondary | ICD-10-CM | POA: Diagnosis not present

## 2017-10-10 DIAGNOSIS — Z1389 Encounter for screening for other disorder: Secondary | ICD-10-CM | POA: Diagnosis not present

## 2017-10-10 DIAGNOSIS — M5410 Radiculopathy, site unspecified: Secondary | ICD-10-CM | POA: Diagnosis not present

## 2017-10-10 DIAGNOSIS — Z1231 Encounter for screening mammogram for malignant neoplasm of breast: Secondary | ICD-10-CM | POA: Diagnosis not present

## 2017-10-12 ENCOUNTER — Other Ambulatory Visit: Payer: Self-pay | Admitting: Cardiovascular Disease

## 2017-10-12 NOTE — Telephone Encounter (Signed)
REFILL 

## 2017-10-18 ENCOUNTER — Other Ambulatory Visit: Payer: Self-pay | Admitting: *Deleted

## 2017-10-18 MED ORDER — NITROGLYCERIN 0.4 MG SL SUBL: 0.4000 mg | SUBLINGUAL_TABLET | SUBLINGUAL | 3 refills | Status: DC | PRN

## 2017-10-19 DIAGNOSIS — D631 Anemia in chronic kidney disease: Secondary | ICD-10-CM | POA: Diagnosis not present

## 2017-10-19 DIAGNOSIS — N052 Unspecified nephritic syndrome with diffuse membranous glomerulonephritis: Secondary | ICD-10-CM | POA: Diagnosis not present

## 2017-11-14 DIAGNOSIS — M1A30X0 Chronic gout due to renal impairment, unspecified site, without tophus (tophi): Secondary | ICD-10-CM | POA: Diagnosis not present

## 2017-11-14 DIAGNOSIS — I129 Hypertensive chronic kidney disease with stage 1 through stage 4 chronic kidney disease, or unspecified chronic kidney disease: Secondary | ICD-10-CM | POA: Diagnosis not present

## 2017-11-14 DIAGNOSIS — E669 Obesity, unspecified: Secondary | ICD-10-CM | POA: Diagnosis not present

## 2017-11-14 DIAGNOSIS — N183 Chronic kidney disease, stage 3 (moderate): Secondary | ICD-10-CM | POA: Diagnosis not present

## 2017-11-14 DIAGNOSIS — N052 Unspecified nephritic syndrome with diffuse membranous glomerulonephritis: Secondary | ICD-10-CM | POA: Diagnosis not present

## 2017-11-14 DIAGNOSIS — E039 Hypothyroidism, unspecified: Secondary | ICD-10-CM | POA: Diagnosis not present

## 2017-11-14 DIAGNOSIS — D631 Anemia in chronic kidney disease: Secondary | ICD-10-CM | POA: Diagnosis not present

## 2017-11-14 DIAGNOSIS — E782 Mixed hyperlipidemia: Secondary | ICD-10-CM | POA: Diagnosis not present

## 2017-11-14 DIAGNOSIS — E118 Type 2 diabetes mellitus with unspecified complications: Secondary | ICD-10-CM | POA: Diagnosis not present

## 2017-11-14 DIAGNOSIS — R809 Proteinuria, unspecified: Secondary | ICD-10-CM | POA: Diagnosis not present

## 2017-11-14 DIAGNOSIS — N2581 Secondary hyperparathyroidism of renal origin: Secondary | ICD-10-CM | POA: Diagnosis not present

## 2017-11-14 DIAGNOSIS — K760 Fatty (change of) liver, not elsewhere classified: Secondary | ICD-10-CM | POA: Diagnosis not present

## 2017-11-18 ENCOUNTER — Ambulatory Visit (HOSPITAL_COMMUNITY)
Admission: RE | Admit: 2017-11-18 | Discharge: 2017-11-18 | Disposition: A | Discharge status: Home / Self Care | Payer: Medicare Other | Source: Ambulatory Visit | Attending: Internal Medicine | Admitting: Internal Medicine

## 2017-11-18 ENCOUNTER — Other Ambulatory Visit (HOSPITAL_COMMUNITY): Payer: Self-pay | Admitting: Internal Medicine

## 2017-11-18 DIAGNOSIS — I700 Atherosclerosis of aorta: Secondary | ICD-10-CM | POA: Insufficient documentation

## 2017-11-18 DIAGNOSIS — M545 Low back pain: Secondary | ICD-10-CM

## 2017-11-18 DIAGNOSIS — Z1389 Encounter for screening for other disorder: Secondary | ICD-10-CM | POA: Diagnosis not present

## 2017-11-18 DIAGNOSIS — M5440 Lumbago with sciatica, unspecified side: Secondary | ICD-10-CM | POA: Insufficient documentation

## 2017-11-18 DIAGNOSIS — E669 Obesity, unspecified: Secondary | ICD-10-CM | POA: Diagnosis not present

## 2017-11-18 DIAGNOSIS — Z6833 Body mass index (BMI) 33.0-33.9, adult: Secondary | ICD-10-CM | POA: Diagnosis not present

## 2017-11-18 DIAGNOSIS — E1129 Type 2 diabetes mellitus with other diabetic kidney complication: Secondary | ICD-10-CM | POA: Diagnosis not present

## 2017-11-18 DIAGNOSIS — M4807 Spinal stenosis, lumbosacral region: Secondary | ICD-10-CM | POA: Diagnosis not present

## 2017-11-18 DIAGNOSIS — M4804 Spinal stenosis, thoracic region: Secondary | ICD-10-CM | POA: Insufficient documentation

## 2017-11-29 DIAGNOSIS — D631 Anemia in chronic kidney disease: Secondary | ICD-10-CM | POA: Diagnosis not present

## 2018-01-17 DIAGNOSIS — E782 Mixed hyperlipidemia: Secondary | ICD-10-CM | POA: Diagnosis not present

## 2018-01-17 DIAGNOSIS — E669 Obesity, unspecified: Secondary | ICD-10-CM | POA: Diagnosis not present

## 2018-01-17 DIAGNOSIS — M1A30X0 Chronic gout due to renal impairment, unspecified site, without tophus (tophi): Secondary | ICD-10-CM | POA: Diagnosis not present

## 2018-01-17 DIAGNOSIS — R809 Proteinuria, unspecified: Secondary | ICD-10-CM | POA: Diagnosis not present

## 2018-01-17 DIAGNOSIS — I129 Hypertensive chronic kidney disease with stage 1 through stage 4 chronic kidney disease, or unspecified chronic kidney disease: Secondary | ICD-10-CM | POA: Diagnosis not present

## 2018-01-17 DIAGNOSIS — N183 Chronic kidney disease, stage 3 (moderate): Secondary | ICD-10-CM | POA: Diagnosis not present

## 2018-01-17 DIAGNOSIS — E118 Type 2 diabetes mellitus with unspecified complications: Secondary | ICD-10-CM | POA: Diagnosis not present

## 2018-01-17 DIAGNOSIS — K760 Fatty (change of) liver, not elsewhere classified: Secondary | ICD-10-CM | POA: Diagnosis not present

## 2018-01-17 DIAGNOSIS — N052 Unspecified nephritic syndrome with diffuse membranous glomerulonephritis: Secondary | ICD-10-CM | POA: Diagnosis not present

## 2018-01-17 DIAGNOSIS — D631 Anemia in chronic kidney disease: Secondary | ICD-10-CM | POA: Diagnosis not present

## 2018-01-17 DIAGNOSIS — E039 Hypothyroidism, unspecified: Secondary | ICD-10-CM | POA: Diagnosis not present

## 2018-01-17 DIAGNOSIS — N2581 Secondary hyperparathyroidism of renal origin: Secondary | ICD-10-CM | POA: Diagnosis not present

## 2018-01-18 DIAGNOSIS — D631 Anemia in chronic kidney disease: Secondary | ICD-10-CM | POA: Diagnosis not present

## 2018-01-18 DIAGNOSIS — D519 Vitamin B12 deficiency anemia, unspecified: Secondary | ICD-10-CM | POA: Diagnosis not present

## 2018-01-24 ENCOUNTER — Telehealth: Payer: Self-pay | Admitting: Cardiovascular Disease

## 2018-01-24 NOTE — Telephone Encounter (Signed)
Received records from [place] on 01/24/18, Appt 01/30/18 @ 11:00AM. [place]

## 2018-01-30 ENCOUNTER — Ambulatory Visit (INDEPENDENT_AMBULATORY_CARE_PROVIDER_SITE_OTHER): Payer: Medicare Other | Admitting: Cardiovascular Disease

## 2018-01-30 ENCOUNTER — Encounter: Payer: Self-pay | Admitting: Cardiovascular Disease

## 2018-01-30 VITALS — BP 122/80 | HR 64 | Ht 63.0 in | Wt 168.8 lb

## 2018-01-30 DIAGNOSIS — R351 Nocturia: Secondary | ICD-10-CM

## 2018-01-30 DIAGNOSIS — E118 Type 2 diabetes mellitus with unspecified complications: Secondary | ICD-10-CM | POA: Diagnosis not present

## 2018-01-30 DIAGNOSIS — R0683 Snoring: Secondary | ICD-10-CM | POA: Diagnosis not present

## 2018-01-30 DIAGNOSIS — I10 Essential (primary) hypertension: Secondary | ICD-10-CM | POA: Diagnosis not present

## 2018-01-30 DIAGNOSIS — N052 Unspecified nephritic syndrome with diffuse membranous glomerulonephritis: Secondary | ICD-10-CM | POA: Diagnosis not present

## 2018-01-30 DIAGNOSIS — G4733 Obstructive sleep apnea (adult) (pediatric): Secondary | ICD-10-CM

## 2018-01-30 NOTE — Progress Notes (Signed)
Patient ID: [name], female   DOB: 1951-03-31, 67 y.o.   MRN: [id number]    Primary M.D.: Dr. Sharilyn Sites Primary nephrologist: Dr. Jimmy Footman  HPI: [name] is a 67 y.o. female who presents for a 8 month cardiology evaluation.  Ms. 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 Jimmy Footman. She also has a history of hypertension, hypothyroidism,  DM2, GERD as well as obstructive sleep apnea and has only  intermittently utilized CPAP therapy. Her OSA is moderate with an AHI of 20.9 but severe during REM sleep with an AHI of 54.3 which was noted on her initial sleep study in 2009.  She also has a history of hyperlipidemia and has been on combination therapy with fenofibrate and Crestor.  She underwent another kidney biopsy in October 2014.  This did not show any significant pathologic change.  She has been on direct renin inhibition and ARB therapy in attempt to reduce protein excretion.  She tells recently, her proteinuria has increased to 10 grams. She has a history of hypothyroidism and is on thyroid replacement.   She has a history of hyperlipidemia and has been on combination Crestor and fenofibrate.  In addition to her tekturna and micardis she also has been taking Lasix 40 mg daily, Toprol-XL 50 mg and Cardizem 240 mg for additional blood pressure control.  She is diabetic on Levemir insulin.  She no longer is taking Tradjenta.  She now sees Dr. Suzette Battiest for her endocrinologic care.  She denies chest pain brought on with exertion.  She admits to ankle swelling.  He is followed by Dr. Reinaldo Meeker dating for renal disease.  Her creatinine had risen up to 2.4 but most recently this had improved to 2.0.  She was started on a trial of cyclosporine, but she did not tolerate this.  She had experienced some rare to occasional palpitations.  This seemed to occur when she was on levothyroxine at 225 g.  Her symptoms  improved with dose reduction down to 200 g.  Her Synthroid dose was further reduced to 175 g.  In August 2018, TSH was 0.08.  She has been followed by Dr. Jimmy Footman for her nephrotic range proteinuria.  When I last saw her in November 2018   protein to creatinine ratio was 20,927.  Since I last saw her, she ultimately was started on Cytoxan in January 2019 and also was on prednisone.  She developed leg swelling on prednisone treated with Lasix.  Recently due to anemia her Cytoxan dose was reduced from 150 mg down to 75 mg.  She recently saw Dr. Jimmy Footman on Jan 17, 2018.  Hematocrit was 27.9.  Glucose was increased.  BUN 14 creatinine 1.23.  She tells me that she has not used CPAP therapy in over 2 years.  She snores loudly.  She wakes up at least 3-4 times per night for urination.  She presents for evaluation.  Past Medical History:  Diagnosis Date  . Anemia    "as a kid" (06/18/2013)  . Anginal pain ([place])    "often; usually when thyroid goes out of wack and heart starts fluttering" (06/18/2013)  . Arthritis    "joints probably" (101/27/2014)  . Carotid stenosis ~ 01/2013   "bilaterally; ~ 50%; dx'd/carotid US" (06/18/2013)  . Dysrhythmia    "palpitation-type feelings; related to thyroid" (06/18/2013)  . Exertional shortness of breath   . GERD (gastroesophageal reflux disease)   .  Gout   . Headache(784.0)    "monthly" (06/18/2013)  . Heart murmur   . Hepatitis, unspecified 1972   "husband had hepatitis; my mother, daughter, and me all had to get the gammaglobulin shots" (06/18/2013)  . High cholesterol   . Hypertension   . Hypothyroidism   . Membranous glomerulonephritis   . Pneumonia ?1980's; 09/2011   "twice" (06/18/2013)  . Sleep apnea    "have mask; haven't worn it in ~ 1 yr" (06/18/2013)  . Type II diabetes mellitus ([place])     Past Surgical History:  Procedure Laterality Date  . BUNIONECTOMY Bilateral [id number]   "once on each side" (06/18/2013)  . CESAREAN SECTION  1971;  1977  . CHOLECYSTECTOMY  ?1998  . COLONOSCOPY N/A 12/03/2015   Procedure: COLONOSCOPY;  Surgeon: Daneil Dolin, MD;  Location: [facility];  Service: Endoscopy;  Laterality: N/A;  8:30 AM  . RENAL BIOPSY Left ~ 1997; 06/18/2013  . SHOULDER ARTHROSCOPY W/ ROTATOR CUFF REPAIR Right 2009  . TONSILLECTOMY      Allergies  Allergen Reactions  . Codeine Nausea And Vomiting  . Ibuprofen Other (See Comments)    Per kidney MD.  . Nsaids Other (See Comments)    Per Kidney DR. No NSAIDS  . Shellfish Allergy Other (See Comments)    REACTION: Gout Flare-ups   . Septra Ds [Sulfamethoxazole-Trimethoprim] Itching and Rash    Current Outpatient Medications  Medication Sig Dispense Refill  . allopurinol (ZYLOPRIM) 300 MG tablet Take 300 mg by mouth daily.    Marland Kitchen aspirin 81 MG tablet Take 81 mg by mouth daily.    . cetirizine (ZYRTEC) 10 MG tablet Take 10 mg by mouth at bedtime.    . cyclophosphamide (CYTOXAN) 50 MG capsule Take 75 mg by mouth daily.    Marland Kitchen diltiazem (CARDIZEM CD) 240 MG 24 hr capsule Take 240 mg by mouth at bedtime.    . Esomeprazole Magnesium (NEXIUM PO) Take 1 tablet by mouth daily.    . fenofibrate (TRICOR) 145 MG tablet Take 145 mg by mouth at bedtime.     . furosemide (LASIX) 40 MG tablet Take 80 mg by mouth daily.     . insulin aspart (NOVOLOG) 100 UNIT/ML injection Inject 3-8 Units into the skin 3 (three) times daily before meals. On sliding scale    . insulin detemir (LEVEMIR) 100 UNIT/ML injection Inject 40 Units into the skin daily. 30 Units AM; 10 units PM    . metoprolol succinate (TOPROL-XL) 100 MG 24 hr tablet TAKE 1 TABLET BY MOUTH EVERY DAY WITH FOOD 30 tablet 5  . Multiple Vitamin (MULTIVITAMIN WITH MINERALS) TABS tablet Take 1 tablet by mouth daily. Reported on 10/28/2015    . ondansetron (ZOFRAN) 4 MG tablet Take 4 mg by mouth every 8 (eight) hours as needed for nausea or vomiting.    Marland Kitchen rOPINIRole (REQUIP) 0.5 MG tablet Take 1 mg by mouth at bedtime.     .  rosuvastatin (CRESTOR) 40 MG tablet Take 40 mg by mouth at bedtime.    Marland Kitchen SYNTHROID 175 MCG tablet Take 175 mcg by mouth daily.   0  . telmisartan (MICARDIS) 80 MG tablet Take 160 mg by mouth at bedtime.     . nitroGLYCERIN (NITROSTAT) 0.4 MG SL tablet Place 1 tablet (0.4 mg total) under the tongue every 5 (five) minutes as needed for chest pain. 75 tablet 3   No current facility-administered medications for this visit.     Social History  Socioeconomic History  . Marital status: Married    Spouse name: Not on file  . Number of children: Not on file  . Years of education: Not on file  . Highest education level: Not on file  Occupational History  . Not on file  Social Needs  . Financial resource strain: Not on file  . Food insecurity:    Worry: Not on file    Inability: Not on file  . Transportation needs:    Medical: Not on file    Non-medical: Not on file  Tobacco Use  . Smoking status: Never Smoker  . Smokeless tobacco: Never Used  Substance and Sexual Activity  . Alcohol use: No  . Drug use: No  . Sexual activity: Never  Lifestyle  . Physical activity:    Days per week: Not on file    Minutes per session: Not on file  . Stress: Not on file  Relationships  . Social connections:    Talks on phone: Not on file    Gets together: Not on file    Attends religious service: Not on file    Active member of club or organization: Not on file    Attends meetings of clubs or organizations: Not on file    Relationship status: Not on file  . Intimate partner violence:    Fear of current or ex partner: Not on file    Emotionally abused: Not on file    Physically abused: Not on file    Forced sexual activity: Not on file  Other Topics Concern  . Not on file  Social History Narrative  . Not on file   Socially, she has just retired.  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  ROS General: Negative; No fevers, chills, or night sweats;    HEENT: Negative; No changes in vision or hearing, sinus congestion, difficulty swallowing Pulmonary: Negative; No cough, wheezing, shortness of breath, hemoptysis Cardiovascular: Negative; No chest pain, presyncope, syncope, palpitations Positive for occasional edema GI: Negative; No nausea, vomiting, diarrhea, or abdominal pain GU: Negative; No dysuria, hematuria, or difficulty voiding Renal: Positive for membranous glomerulonephropathy with nephrotic proteinuria Musculoskeletal: Positive for TMJ; no myalgias, joint pain, or weakness Hematologic/Oncology: Negative; no easy bruising, bleeding Endocrine: Negative; no heat/cold intolerance; no diabetes Neuro: Negative; no changes in balance, headaches Skin: Negative; No rashes or skin lesions Psychiatric: Negative; No behavioral problems, depression Sleep: Positive for OSA; Not using CPAP;  Other comprehensive 14 point system review is negative.  PE BP 122/80   Pulse 64   Ht _0  (1.6 m)   Wt 168 lb 12.8 oz (76.6 kg)   BMI 29.90 kg/m   Repeat  blood pressure by me was 116/74  Wt Readings from Last 3 Encounters:  01/30/18 168 lb 12.8 oz (76.6 kg)  07/19/17 159 lb 6.4 oz (72.3 kg)  01/19/17 150 lb 9.6 oz (68.3 kg)   General: Alert, oriented, no distress.  Skin: normal turgor, no rashes, warm and dry HEENT: Normocephalic, atraumatic. Pupils equal round and reactive to light; sclera anicteric; extraocular muscles intact;  Nose without nasal septal hypertrophy Mouth/Parynx benign; Mallinpatti scale 3 Neck: No JVD, no carotid bruits; normal carotid upstroke Lungs: clear to ausculatation and percussion; no wheezing or rales Chest wall: without tenderness to palpitation Heart: PMI not displaced, RRR, s1 s2 normal, 1/6 systolic murmur, no diastolic murmur, no rubs, gallops, thrills, or heaves Abdomen: soft, nontender; no hepatosplenomehaly, BS+; abdominal aorta  nontender and not dilated by palpation. Back: no CVA tenderness Pulses  2+ Musculoskeletal: full range of motion, normal strength, no joint deformities Extremities: no clubbing cyanosis or edema, Homan's sign negative  Neurologic: grossly nonfocal; Cranial nerves grossly wnl Psychologic: Normal mood and affect   ECG (independently read by me): Sinus at 64 bpm.  Normal intervals.  No ectopy.  November 2018 ECG (independently read by me): Normal sinus rhythm at 62 bpm.  No syncope.  No CT changes.  QTc interval 456 ms  May 2018 ECG (independently read by me): Sinus bradycardia 57 bpm.  PR interval 146 and QTc interval 447 ms.  No ectopy.  May 2017 ECG (independently read by me): Normal sinus rhythm at 75 bpm.  LVH by voltage criteria.  No significant ST changes.  QTc interval 460 ms.  ECG (independently read by me): Sinus rhythm at 99 bpm.  Left axis deviation.  Poor progression anteriorly.  August 2015 ECG (independently read by me): Normal sinus rhythm at 70 beats per minute.  QTc interval 423 ms.  PR interval normal at 150 ms.  Prior ECG: Normal sinus rhythm at 92 beats per minute. Poor R wave progression.  LABS: BMP Latest Ref Rng & Units 08/21/2014 06/18/2013 10/29/2012  Glucose 70 - 99 mg/dL - 125(H) 195(H)  BUN 6 - 23 mg/dL - 24(H) 14  Creatinine 0.50 - 1.10 mg/dL 1.30(H) 1.08 1.06  Sodium 135 - 145 mEq/L - 139 140  Potassium 3.5 - 5.1 mEq/L - 4.2 4.1  Chloride 96 - 112 mEq/L - 104 103  CO2 19 - 32 mEq/L - 25 25  Calcium 8.4 - 10.5 mg/dL - 10.3 10.4   Hepatic Function Latest Ref Rng & Units 11/07/2011  Total Protein 6.0 - 8.3 g/dL 6.6  Albumin 3.5 - 5.2 g/dL 3.3(L)  AST 0 - 37 U/L 36  ALT 0 - 35 U/L 52(H)  Alk Phosphatase 39 - 117 U/L 43  Total Bilirubin 0.3 - 1.2 mg/dL 0.3    CBC Latest Ref Rng & Units 06/19/2013 06/18/2013 06/18/2013  WBC 4.0 - 10.5 K/uL 10.6(H) 9.4 9.5  Hemoglobin 12.0 - 15.0 g/dL 11.6(L) 11.6(L) 12.7  Hematocrit 36.0 - 46.0 % 33.9(L) 34.1(L) 36.9  Platelets 150 - 400 K/uL 279 293 306   . Lab Results  Component Value  Date   MCV 92.6 06/19/2013   MCV 92.4 06/18/2013   MCV 92.3 06/18/2013   No results found for: TSH No results found for: HGBA1C   Lipid Panel  No results found for: CHOL, TRIG, HDL, CHOLHDL, VLDL, LDLCALC, LDLDIRECT   RADIOLOGY: No results found.  IMPRESSION:  1. OSA (obstructive sleep apnea)   2. Essential hypertension   3. Loud snoring   4. Excessive urination at night   5. Membranous glomerulonephritis   6. DM (diabetes mellitus) with complications [facility])     ASSESSMENT AND PLAN: Ms Tat is a 67 year old female who has a history of hypertension, hypothyroidism, GERD, obstructive sleep apnea, insulin-dependent diabetes mellitus as well as her nephrotic range proteinuria due to membranous glomerulonephritis.  She had been on combination therapy with  micardis 160 mg and tekturna 150 mg , both for blood pressure and her proteinuria.  Currently, her blood pressure today is controlled on diltiazem 240 mg, furosemide 80 mg daily, Toprol-XL 100 mg, in addition to telmisartan 80 mg daily.  She has been started on Cytoxan and had received prednisone for a month which resulted in increased edema which ultimately resolved with  diuresis.  She is mildly anemic and as result her dose of Cytoxan was recently reduced.  She has a history of hypothyroidism and continues to be on Synthroid 175 mcg.  She has hyperlipidemia with target LDL less than 70 and is on rosuvastatin 40 mg daily in addition to fenofibrate 145 mg daily.  She is diabetic on insulin.  I had a long discussion with her today in the office regarding her untreated sleep apnea.  Remotely, she was found to have moderate overall sleep apnea with an AHI of 20.9 but she had severe sleep apnea during rem sleep with an AHI of 54.3.  I again discussed with her the adverse cardiovascular consequences if sleep apnea is untreated.  She is symptomatic.  We will initiate the process and I will schedule her for a new sleep study for further evaluation.   She apparently has been taking aspirin 162 mg daily.  I have recommended she reduce this to 81 mg.  I will see her back in the office in 4 to 5 months for reevaluation.   Time spent: 25 minutes  Troy Sine, MD, [facility]  01/31/2018 6:46 PM

## 2018-01-30 NOTE — Patient Instructions (Addendum)
Medication Instructions:  Decrease Aspirin 81 mg daily  Testing/Procedures: Your physician has recommended that you have a sleep study. This test records several body functions during sleep, including: brain activity, eye movement, oxygen and carbon dioxide blood levels, heart rate and rhythm, breathing rate and rhythm, the flow of air through your mouth and nose, snoring, body muscle movements, and chest and belly movement.  Follow-Up: 4-5 months with Dr. Claiborne Billings  Any Other Special Instructions Will Be Listed Below (If Applicable).     If you need a refill on your cardiac medications before your next appointment, please call your pharmacy.

## 2018-01-31 ENCOUNTER — Encounter: Payer: Self-pay | Admitting: Cardiovascular Disease

## 2018-02-02 DIAGNOSIS — E1165 Type 2 diabetes mellitus with hyperglycemia: Secondary | ICD-10-CM | POA: Diagnosis not present

## 2018-02-02 DIAGNOSIS — N183 Chronic kidney disease, stage 3 (moderate): Secondary | ICD-10-CM | POA: Diagnosis not present

## 2018-02-02 DIAGNOSIS — E039 Hypothyroidism, unspecified: Secondary | ICD-10-CM | POA: Diagnosis not present

## 2018-02-02 DIAGNOSIS — I10 Essential (primary) hypertension: Secondary | ICD-10-CM | POA: Diagnosis not present

## 2018-02-02 DIAGNOSIS — E7800 Pure hypercholesterolemia, unspecified: Secondary | ICD-10-CM | POA: Diagnosis not present

## 2018-02-03 ENCOUNTER — Telehealth: Payer: Self-pay | Admitting: *Deleted

## 2018-02-03 DIAGNOSIS — D631 Anemia in chronic kidney disease: Secondary | ICD-10-CM | POA: Diagnosis not present

## 2018-02-03 NOTE — Telephone Encounter (Signed)
Patient notified of 02/12/18 sleep study appointment @ [facility].

## 2018-02-03 NOTE — Telephone Encounter (Signed)
-----   Message from Lawana Pai sent at 01/31/2018  9:17 AM EDT ----- Regarding: sleep study Dr. Claiborne Billings has ordered sleep study on t his patient.

## 2018-02-08 DIAGNOSIS — N052 Unspecified nephritic syndrome with diffuse membranous glomerulonephritis: Secondary | ICD-10-CM | POA: Diagnosis not present

## 2018-02-12 ENCOUNTER — Ambulatory Visit: Payer: Medicare Other | Attending: Cardiovascular Disease | Admitting: Cardiovascular Disease

## 2018-02-12 DIAGNOSIS — G4763 Sleep related bruxism: Secondary | ICD-10-CM | POA: Diagnosis not present

## 2018-02-12 DIAGNOSIS — R351 Nocturia: Secondary | ICD-10-CM | POA: Diagnosis not present

## 2018-02-12 DIAGNOSIS — I10 Essential (primary) hypertension: Secondary | ICD-10-CM | POA: Insufficient documentation

## 2018-02-12 DIAGNOSIS — G4733 Obstructive sleep apnea (adult) (pediatric): Secondary | ICD-10-CM | POA: Diagnosis not present

## 2018-02-12 DIAGNOSIS — R0683 Snoring: Secondary | ICD-10-CM | POA: Insufficient documentation

## 2018-02-19 ENCOUNTER — Encounter: Payer: Self-pay | Admitting: Cardiovascular Disease

## 2018-02-19 NOTE — Procedures (Signed)
[facility]   Patient Name: [name], [name] Date: 02/12/2018 Gender: Female D.O.B: 05-18-1951 Age (years): 71 Referring Provider: Shelva Majestic MD, ABSM Height (inches): 63 Interpreting Physician: Shelva Majestic MD, ABSM Weight (lbs): 168 RPSGT: Rosebud Poles BMI: 30 MRN: [id number] Neck Size: 16.00  CLINICAL INFORMATION Sleep Study Type: Split Night CPAP  Indication for sleep study: OSA, Snoring  Epworth Sleepiness Score: 6  SLEEP STUDY TECHNIQUE As per the AASM Manual for the Scoring of Sleep and Associated Events v2.3 (April 2016) with a hypopnea requiring 4% desaturations.  The channels recorded and monitored were frontal, central and occipital EEG, electrooculogram (EOG), submentalis EMG (chin), nasal and oral airflow, thoracic and abdominal wall motion, anterior tibialis EMG, snore microphone, electrocardiogram, and pulse oximetry. Continuous positive airway pressure (CPAP) was initiated when the patient met split night criteria and was titrated according to treat sleep-disordered breathing.  MEDICATIONS     allopurinol (ZYLOPRIM) 300 MG tablet         aspirin 81 MG tablet         cetirizine (ZYRTEC) 10 MG tablet         cyclophosphamide (CYTOXAN) 50 MG capsule         diltiazem (CARDIZEM CD) 240 MG 24 hr capsule         Esomeprazole Magnesium (NEXIUM PO)         fenofibrate (TRICOR) 145 MG tablet         furosemide (LASIX) 40 MG tablet         insulin aspart (NOVOLOG) 100 UNIT/ML injection         insulin detemir (LEVEMIR) 100 UNIT/ML injection         metoprolol succinate (TOPROL-XL) 100 MG 24 hr tablet         Multiple Vitamin (MULTIVITAMIN WITH MINERALS) TABS tablet         nitroGLYCERIN (NITROSTAT) 0.4 MG SL tablet (Expired)         ondansetron (ZOFRAN) 4 MG tablet         rOPINIRole (REQUIP) 0.5 MG tablet         rosuvastatin (CRESTOR) 40 MG tablet         SYNTHROID 175 MCG tablet              Medications self-administered by patient taken the  night of the study : N/A  RESPIRATORY PARAMETERS Diagnostic Total AHI (/hr): 44.1 RDI (/hr): 52.6 OA Index (/hr): 5.2 CA Index (/hr): 0.4 REM AHI (/hr): 74.4 NREM AHI (/hr): 38.1 Supine AHI (/hr): 39.0 Non-supine AHI (/hr): 62.77 Min O2 Sat (%): 73.0 Mean O2 (%): 90.2 Time below 88% (min): 35.7   Titration Optimal Pressure (cm): 10 AHI at Optimal Pressure (/hr): 0.0 Min O2 at Optimal Pressure (%): 87.0 Supine % at Optimal (%): 100 Sleep % at Optimal (%): 96   SLEEP ARCHITECTURE The recording time for the entire night was 401.2 minutes.  During a baseline period of 156.4 minutes, the patient slept for 149.5 minutes in REM and nonREM, yielding a sleep efficiency of 95.6%%. Sleep onset after lights out was 2.8 minutes with a REM latency of 116.5 minutes. The patient spent 1.3%% of the night in stage N1 sleep, 71.9%% in stage N2 sleep, 10.0%% in stage N3 and 16.7%% in REM.  During the titration period of 242.2 minutes, the patient slept for 227.4 minutes in REM and nonREM, yielding a sleep efficiency of 93.9%%. Sleep onset after CPAP initiation was 8.3 minutes with a REM latency  of 72.0 minutes. The patient spent 2.2%% of the night in stage N1 sleep, 38.7%% in stage N2 sleep, 29.0%% in stage N3 and 30.1%% in REM.  CARDIAC DATA The 2 lead EKG demonstrated sinus rhythm. The mean heart rate was 100.0 beats per minute. Other EKG findings include: None.  LEG MOVEMENT DATA The total Periodic Limb Movements of Sleep (PLMS) were 0. The PLMS index was 0.0 .  IMPRESSIONS - Severe obstructive sleep apnea occurred during the diagnostic portion of the study (AHI 44.1/h; RDI 52.6/h).   CPAP was initiated at 4 cm water pressure and was titrated up to 10 cm;  AHI at 10 cm was 0.  Oxygen desaturation nadir at 10 cm was 87% durin non-REM sleep and 90.4% during REM sleep. - No significant central sleep apnea occurred during the diagnostic portion of the study (CAI = 0.4/hour). - Severe oxygen desaturation to a  nadir of 73% during the diagnostic portion of the study. - The patient snored with soft snoring volume during the diagnostic portion of the study. - No cardiac abnormalities were noted during this study. - Clinically significant periodic limb movements did not occur during sleep.  DIAGNOSIS - Obstructive Sleep Apnea (327.23 [G47.33 ICD-10]) - Bruxism (327.53 [G47.63 ICD-10])  RECOMMENDATIONS - Recommend an initial trial of CPAP therapy with EPR of 3 at 11 cm H2O with heated humidification.  A  Small size Resmed Nasal Mask AirFit N10 mask. - Efforts should be made to optimize nasal and oral pharyngeal patency.  - Consider an oral guard for Bruxism. - Avoid alcohol, sedatives and other CNS depressants that may worsen sleep apnea and disrupt normal sleep architecture. - Sleep hygiene should be reviewed to assess factors that may improve sleep quality. - Weight management and regular exercise should be initiated or continued. - Return to Sleep Center for re-evaluation after 4 weeks of therapy  [Electronically signed] 02/19/2018 10:34 AM  Shelva Majestic MD, [facility], ABSM Diplomate, American Board of Sleep Medicine   NPI: [id number] [place] PH: [phone]   FX: [phone] [place]

## 2018-02-20 ENCOUNTER — Telehealth: Payer: Self-pay | Admitting: *Deleted

## 2018-02-20 ENCOUNTER — Other Ambulatory Visit: Payer: Self-pay | Admitting: Cardiovascular Disease

## 2018-02-20 DIAGNOSIS — G4733 Obstructive sleep apnea (adult) (pediatric): Secondary | ICD-10-CM

## 2018-02-20 NOTE — Telephone Encounter (Signed)
-----   Message from Troy Sine, MD sent at 02/19/2018 10:39 AM EDT ----- Mariann Laster, PLEASE NOTIFY THE PATIENT OF HER SEVERE SLEEP APNEA.  SET UP WITH DMECOMPANY TO INITIATE CPAP AT 11 CM.  DOWNLOAD IN 30 DAYS AND FOLLOW-UP SLEEP CLINIC EVALUATION

## 2018-02-20 NOTE — Telephone Encounter (Signed)
Patient notified of sleep study results and recommendations. She requests for CPAP referral to be sent to [facility].

## 2018-03-17 DIAGNOSIS — G4733 Obstructive sleep apnea (adult) (pediatric): Secondary | ICD-10-CM | POA: Diagnosis not present

## 2018-03-17 DIAGNOSIS — Z0001 Encounter for general adult medical examination with abnormal findings: Secondary | ICD-10-CM | POA: Diagnosis not present

## 2018-03-17 DIAGNOSIS — E782 Mixed hyperlipidemia: Secondary | ICD-10-CM | POA: Diagnosis not present

## 2018-03-17 DIAGNOSIS — E1129 Type 2 diabetes mellitus with other diabetic kidney complication: Secondary | ICD-10-CM | POA: Diagnosis not present

## 2018-03-17 DIAGNOSIS — Z6831 Body mass index (BMI) 31.0-31.9, adult: Secondary | ICD-10-CM | POA: Diagnosis not present

## 2018-03-17 DIAGNOSIS — E6609 Other obesity due to excess calories: Secondary | ICD-10-CM | POA: Diagnosis not present

## 2018-03-17 DIAGNOSIS — J984 Other disorders of lung: Secondary | ICD-10-CM | POA: Diagnosis not present

## 2018-03-17 DIAGNOSIS — N059 Unspecified nephritic syndrome with unspecified morphologic changes: Secondary | ICD-10-CM | POA: Diagnosis not present

## 2018-03-17 DIAGNOSIS — E039 Hypothyroidism, unspecified: Secondary | ICD-10-CM | POA: Diagnosis not present

## 2018-03-20 ENCOUNTER — Other Ambulatory Visit (HOSPITAL_COMMUNITY): Payer: Self-pay | Admitting: Family Medicine

## 2018-03-20 DIAGNOSIS — E2839 Other primary ovarian failure: Secondary | ICD-10-CM

## 2018-03-30 DIAGNOSIS — E669 Obesity, unspecified: Secondary | ICD-10-CM | POA: Diagnosis not present

## 2018-03-30 DIAGNOSIS — D631 Anemia in chronic kidney disease: Secondary | ICD-10-CM | POA: Diagnosis not present

## 2018-03-30 DIAGNOSIS — E6609 Other obesity due to excess calories: Secondary | ICD-10-CM | POA: Diagnosis not present

## 2018-03-30 DIAGNOSIS — E039 Hypothyroidism, unspecified: Secondary | ICD-10-CM | POA: Diagnosis not present

## 2018-03-30 DIAGNOSIS — K760 Fatty (change of) liver, not elsewhere classified: Secondary | ICD-10-CM | POA: Diagnosis not present

## 2018-03-30 DIAGNOSIS — N2581 Secondary hyperparathyroidism of renal origin: Secondary | ICD-10-CM | POA: Diagnosis not present

## 2018-03-30 DIAGNOSIS — R809 Proteinuria, unspecified: Secondary | ICD-10-CM | POA: Diagnosis not present

## 2018-03-30 DIAGNOSIS — E782 Mixed hyperlipidemia: Secondary | ICD-10-CM | POA: Diagnosis not present

## 2018-03-30 DIAGNOSIS — Z0001 Encounter for general adult medical examination with abnormal findings: Secondary | ICD-10-CM | POA: Diagnosis not present

## 2018-03-30 DIAGNOSIS — N052 Unspecified nephritic syndrome with diffuse membranous glomerulonephritis: Secondary | ICD-10-CM | POA: Diagnosis not present

## 2018-03-30 DIAGNOSIS — I129 Hypertensive chronic kidney disease with stage 1 through stage 4 chronic kidney disease, or unspecified chronic kidney disease: Secondary | ICD-10-CM | POA: Diagnosis not present

## 2018-03-30 DIAGNOSIS — N183 Chronic kidney disease, stage 3 (moderate): Secondary | ICD-10-CM | POA: Diagnosis not present

## 2018-03-30 DIAGNOSIS — Z1389 Encounter for screening for other disorder: Secondary | ICD-10-CM | POA: Diagnosis not present

## 2018-03-30 DIAGNOSIS — E118 Type 2 diabetes mellitus with unspecified complications: Secondary | ICD-10-CM | POA: Diagnosis not present

## 2018-03-30 DIAGNOSIS — M1A30X0 Chronic gout due to renal impairment, unspecified site, without tophus (tophi): Secondary | ICD-10-CM | POA: Diagnosis not present

## 2018-03-30 DIAGNOSIS — Z6831 Body mass index (BMI) 31.0-31.9, adult: Secondary | ICD-10-CM | POA: Diagnosis not present

## 2018-04-03 ENCOUNTER — Ambulatory Visit (HOSPITAL_COMMUNITY)
Admission: RE | Admit: 2018-04-03 | Discharge: 2018-04-03 | Disposition: A | Discharge status: Home / Self Care | Payer: Medicare Other | Source: Ambulatory Visit | Attending: Family Medicine | Admitting: Family Medicine

## 2018-04-03 DIAGNOSIS — M8589 Other specified disorders of bone density and structure, multiple sites: Secondary | ICD-10-CM | POA: Diagnosis not present

## 2018-04-03 DIAGNOSIS — Z13820 Encounter for screening for osteoporosis: Secondary | ICD-10-CM | POA: Insufficient documentation

## 2018-04-03 DIAGNOSIS — E2839 Other primary ovarian failure: Secondary | ICD-10-CM | POA: Insufficient documentation

## 2018-04-03 DIAGNOSIS — Z780 Asymptomatic menopausal state: Secondary | ICD-10-CM | POA: Diagnosis not present

## 2018-04-03 DIAGNOSIS — M8580 Other specified disorders of bone density and structure, unspecified site: Secondary | ICD-10-CM | POA: Diagnosis not present

## 2018-04-04 ENCOUNTER — Telehealth: Payer: Self-pay | Admitting: Cardiovascular Disease

## 2018-04-04 NOTE — Telephone Encounter (Signed)
Received records from [place] on 04/04/18, Appt 06/19/18 @ 3:40PM. [place]

## 2018-04-09 ENCOUNTER — Other Ambulatory Visit: Payer: Self-pay | Admitting: Cardiovascular Disease

## 2018-06-02 DIAGNOSIS — K760 Fatty (change of) liver, not elsewhere classified: Secondary | ICD-10-CM | POA: Diagnosis not present

## 2018-06-02 DIAGNOSIS — E669 Obesity, unspecified: Secondary | ICD-10-CM | POA: Diagnosis not present

## 2018-06-02 DIAGNOSIS — E118 Type 2 diabetes mellitus with unspecified complications: Secondary | ICD-10-CM | POA: Diagnosis not present

## 2018-06-02 DIAGNOSIS — D631 Anemia in chronic kidney disease: Secondary | ICD-10-CM | POA: Diagnosis not present

## 2018-06-02 DIAGNOSIS — N2581 Secondary hyperparathyroidism of renal origin: Secondary | ICD-10-CM | POA: Diagnosis not present

## 2018-06-02 DIAGNOSIS — N052 Unspecified nephritic syndrome with diffuse membranous glomerulonephritis: Secondary | ICD-10-CM | POA: Diagnosis not present

## 2018-06-02 DIAGNOSIS — R809 Proteinuria, unspecified: Secondary | ICD-10-CM | POA: Diagnosis not present

## 2018-06-02 DIAGNOSIS — M1A30X0 Chronic gout due to renal impairment, unspecified site, without tophus (tophi): Secondary | ICD-10-CM | POA: Diagnosis not present

## 2018-06-02 DIAGNOSIS — E039 Hypothyroidism, unspecified: Secondary | ICD-10-CM | POA: Diagnosis not present

## 2018-06-02 DIAGNOSIS — N183 Chronic kidney disease, stage 3 (moderate): Secondary | ICD-10-CM | POA: Diagnosis not present

## 2018-06-02 DIAGNOSIS — E782 Mixed hyperlipidemia: Secondary | ICD-10-CM | POA: Diagnosis not present

## 2018-06-02 DIAGNOSIS — I129 Hypertensive chronic kidney disease with stage 1 through stage 4 chronic kidney disease, or unspecified chronic kidney disease: Secondary | ICD-10-CM | POA: Diagnosis not present

## 2018-06-19 ENCOUNTER — Ambulatory Visit (INDEPENDENT_AMBULATORY_CARE_PROVIDER_SITE_OTHER): Payer: Medicare Other | Admitting: Cardiovascular Disease

## 2018-06-19 ENCOUNTER — Encounter: Payer: Self-pay | Admitting: Cardiovascular Disease

## 2018-06-19 VITALS — BP 128/70 | HR 68 | Ht 61.0 in | Wt 159.6 lb

## 2018-06-19 DIAGNOSIS — N052 Unspecified nephritic syndrome with diffuse membranous glomerulonephritis: Secondary | ICD-10-CM | POA: Diagnosis not present

## 2018-06-19 DIAGNOSIS — E785 Hyperlipidemia, unspecified: Secondary | ICD-10-CM

## 2018-06-19 DIAGNOSIS — I10 Essential (primary) hypertension: Secondary | ICD-10-CM

## 2018-06-19 DIAGNOSIS — E039 Hypothyroidism, unspecified: Secondary | ICD-10-CM | POA: Diagnosis not present

## 2018-06-19 DIAGNOSIS — G4733 Obstructive sleep apnea (adult) (pediatric): Secondary | ICD-10-CM | POA: Diagnosis not present

## 2018-06-19 DIAGNOSIS — E118 Type 2 diabetes mellitus with unspecified complications: Secondary | ICD-10-CM

## 2018-06-19 NOTE — Patient Instructions (Addendum)
Medication Instructions:  Your physician recommends that you continue on your current medications as directed. Please refer to the Current Medication list given to you today.  If you need a refill on your cardiac medications before your next appointment, please call your pharmacy.   Follow-Up: At [facility], you and your health needs are our priority.  As part of our continuing mission to provide you with exceptional heart care, we have created designated Provider Care Teams.  These Care Teams include your primary Cardiologist (physician) and Advanced Practice Providers (APPs -  Physician Assistants and Nurse Practitioners) who all work together to provide you with the care you need, when you need it. You will need a follow up appointment in 6 months.  Please call our office 2 months in advance to schedule this appointment.  You may see Dr. Claiborne Billings or one of the following Advanced Practice Providers on your designated Care Team: [place], [place] . Fabian Sharp, PA-C  .

## 2018-06-19 NOTE — Progress Notes (Signed)
Patient ID: [name], female   DOB: 27-Dec-1950, 67 y.o.   MRN: [id number]    Primary M.D.: [name] Primary nephrologist: [name]  HPI: [name] is a 67 y.o. female who presents for a 4 month cardiology evaluation.  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 She also has a history of hypertension, hypothyroidism,  DM2, GERD as well as obstructive sleep apnea and has only  intermittently utilized CPAP therapy. Her OSA is moderate with an AHI of 20.9 but severe during REM sleep with an AHI of 54.3 which was noted on her initial sleep study in 2009.  She also has a history of hyperlipidemia and has been on combination therapy with fenofibrate and Crestor.  She underwent another kidney biopsy in October 2014.  This did not show any significant pathologic change.  She has been on direct renin inhibition and ARB therapy in attempt to reduce protein excretion.  She tells recently, her proteinuria has increased to 10 grams. She has a history of hypothyroidism and is on thyroid replacement.   She has a history of hyperlipidemia and has been on combination Crestor and fenofibrate.  In addition to her tekturna and micardis she also has been taking Lasix 40 mg daily, Toprol-XL 50 mg and Cardizem 240 mg for additional blood pressure control.  She is diabetic on Levemir insulin.  She no longer is taking Tradjenta.  She now sees [name] for her endocrinologic care.  She denies chest pain brought on with exertion.  She admits to ankle swelling.  He is followed by [name] dating for renal disease.  Her creatinine had risen up to 2.4 but most recently this had improved to 2.0.  She was started on a trial of cyclosporine, but she did not tolerate this.  She had experienced some rare to occasional palpitations.  This seemed to occur when she was on levothyroxine at 225 g.  Her symptoms  improved with dose reduction down to 200 g.  Her Synthroid dose was further reduced to 175 g.  In August 2018, TSH was 0.08.  She is by [name] for her nephrotic range proteinuria.  When I last saw her in November 2018   protein to creatinine ratio was 20,927.  Since I last saw her, she ultimately was started on Cytoxan in January 2019 and also was on prednisone.  She developed leg swelling on prednisone treated with Lasix.  Recently due to anemia her Cytoxan dose was reduced from 150 mg down to 75 mg.  She recently saw [name] on Jan 17, 2018.  Hematocrit was 27.9.  Glucose was increased.  BUN 14 creatinine 1.23.   I last saw [name] in June 2019.  I had reviewed her recent evaluation from nephrology.  When I saw her she informed me that she had not used CPAP in over 2 years.  She was snoring loudly and was waking up at least 3-4 times per night for urination.  I had a long discussion with her regarding adverse consequences of untreated sleep apnea particularly on her cardiovascular health.  She ultimately agreed to undergo a sleep study and reassess her sleep apnea.  The sleep study was done on February 12, 2018 and demonstrated severe sleep apnea with an AHI of 44.1 and RDI 52.6/h.  She had severe oxygen desaturation to a nadir of 73%.  There was soft snoring.  She was titrated on CPAP and 11 cm water pressure was recommended.  Of note, she did have bruxism.  Her CPAP set up date was February 22, 2018 with [facility] as her DME company.  A download from April 24, 2018 through May 23, 2018 shows that she is meeting compliance with 90% of usage days and usage greater than 4 hours at 80%.  She was averaging 6 hours and 14 minutes of CPAP use.  AHI is 4.0.  On several nights she did have moderate mask leak.  She has noticed that she has felt well with therapy.  However, upon further questioning she often goes to bed around 10 PM and may wake up at 8 AM.  I have recommended CPAP for the entire  duration of sleep particularly since the preponderance of rem sleep occurs in the second half of the night when her sleep apnea is worse.  Presently, she is without chest pain.  She has seen [name] in August 2019 and BUN was 22 creatinine 1.33.  She was anemic with a hemoglobin of 9 hematocrit 26.8.  She presents for evaluation.  Past Medical History:  Diagnosis Date  . Anemia    "as a kid" (06/18/2013)  . Anginal pain ([place])    "often; usually when thyroid goes out of wack and heart starts fluttering" (06/18/2013)  . Arthritis    "joints probably" (101/27/2014)  . Carotid stenosis ~ 01/2013   "bilaterally; ~ 50%; dx'd/carotid US" (06/18/2013)  . Dysrhythmia    "palpitation-type feelings; related to thyroid" (06/18/2013)  . Exertional shortness of breath   . GERD (gastroesophageal reflux disease)   . Gout   . WTUUEKCM(034.9)    "monthly" (06/18/2013)  . Heart murmur   . Hepatitis, unspecified 1972   "husband had hepatitis; my mother, daughter, and me all had to get the gammaglobulin shots" (06/18/2013)  . High cholesterol   . Hypertension   . Hypothyroidism   . Membranous glomerulonephritis   . Pneumonia ?1980's; 09/2011   "twice" (06/18/2013)  . Sleep apnea    "have mask; haven't worn it in ~ 1 yr" (06/18/2013)  . Type II diabetes mellitus ([place])     Past Surgical History:  Procedure Laterality Date  . BUNIONECTOMY Bilateral [id number]   "once on each side" (06/18/2013)  . CESAREAN SECTION  1971; 1977  . CHOLECYSTECTOMY  ?1998  . COLONOSCOPY N/A 12/03/2015   Procedure: COLONOSCOPY;  Surgeon: Daneil Dolin, MD;  Location: [facility];  Service: Endoscopy;  Laterality: N/A;  8:30 AM  . RENAL BIOPSY Left ~ 1997; 06/18/2013  . SHOULDER ARTHROSCOPY W/ ROTATOR CUFF REPAIR Right 2009  . TONSILLECTOMY      Allergies  Allergen Reactions  . Codeine Nausea And Vomiting  . Ibuprofen Other (See Comments)    Per kidney MD.  . Nsaids Other (See Comments)    Per Kidney DR.  No NSAIDS  . Shellfish Allergy Other (See Comments)    REACTION: Gout Flare-ups   . Septra Ds [Sulfamethoxazole-Trimethoprim] Itching and Rash    Current Outpatient Medications  Medication Sig Dispense Refill  . allopurinol (ZYLOPRIM) 300 MG tablet Take 300 mg by mouth daily.    Marland Kitchen aspirin 81 MG tablet Take 81 mg by mouth daily.    . cetirizine (ZYRTEC) 10 MG tablet Take 10 mg by mouth at bedtime.    . cyclophosphamide (CYTOXAN) 50 MG capsule Take 75 mg by mouth daily.    Marland Kitchen diltiazem (CARDIZEM CD) 240  MG 24 hr capsule Take 240 mg by mouth at bedtime.    . Esomeprazole Magnesium (NEXIUM PO) Take 1 tablet by mouth daily.    . fenofibrate (TRICOR) 145 MG tablet Take 145 mg by mouth at bedtime.     . furosemide (LASIX) 40 MG tablet Take 80 mg by mouth daily.     . insulin aspart (NOVOLOG) 100 UNIT/ML injection Inject 3-8 Units into the skin 3 (three) times daily before meals. On sliding scale    . insulin detemir (LEVEMIR) 100 UNIT/ML injection Inject 40 Units into the skin daily. 30 Units AM; 10 units PM    . metoprolol succinate (TOPROL-XL) 100 MG 24 hr tablet TAKE 1 TABLET BY MOUTH EVERY DAY WITH FOOD 90 tablet 1  . Multiple Vitamin (MULTIVITAMIN WITH MINERALS) TABS tablet Take 1 tablet by mouth daily. Reported on 10/28/2015    . ondansetron (ZOFRAN) 4 MG tablet Take 4 mg by mouth every 8 (eight) hours as needed for nausea or vomiting.    Marland Kitchen rOPINIRole (REQUIP) 0.5 MG tablet Take 1 mg by mouth at bedtime.     . rosuvastatin (CRESTOR) 40 MG tablet Take 40 mg by mouth at bedtime.    Marland Kitchen SYNTHROID 175 MCG tablet Take 175 mcg by mouth daily.   0  . telmisartan (MICARDIS) 80 MG tablet Take 160 mg by mouth at bedtime.     . nitroGLYCERIN (NITROSTAT) 0.4 MG SL tablet Place 1 tablet (0.4 mg total) under the tongue every 5 (five) minutes as needed for chest pain. 75 tablet 3   No current facility-administered medications for this visit.     Social History   Socioeconomic History  . Marital status:  Married    Spouse name: Not on file  . Number of children: Not on file  . Years of education: Not on file  . Highest education level: Not on file  Occupational History  . Not on file  Social Needs  . Financial resource strain: Not on file  . Food insecurity:    Worry: Not on file    Inability: Not on file  . Transportation needs:    Medical: Not on file    Non-medical: Not on file  Tobacco Use  . Smoking status: Never Smoker  . Smokeless tobacco: Never Used  Substance and Sexual Activity  . Alcohol use: No  . Drug use: No  . Sexual activity: Never  Lifestyle  . Physical activity:    Days per week: Not on file    Minutes per session: Not on file  . Stress: Not on file  Relationships  . Social connections:    Talks on phone: Not on file    Gets together: Not on file    Attends religious service: Not on file    Active member of club or organization: Not on file    Attends meetings of clubs or organizations: Not on file    Relationship status: Not on file  . Intimate partner violence:    Fear of current or ex partner: Not on file    Emotionally abused: Not on file    Physically abused: Not on file    Forced sexual activity: Not on file  Other Topics Concern  . Not on file  Social History Narrative  . Not on file   Socially, she has just retired.  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  ROS General: Negative; No fevers, chills, or night  sweats;  HEENT: Negative; No changes in vision or hearing, sinus congestion, difficulty swallowing Pulmonary: Negative; No cough, wheezing, shortness of breath, hemoptysis Cardiovascular: Negative; No chest pain, presyncope, syncope, palpitations Positive for occasional edema GI: Negative; No nausea, vomiting, diarrhea, or abdominal pain GU: Negative; No dysuria, hematuria, or difficulty voiding Renal: Positive for membranous glomerulonephropathy with nephrotic proteinuria Musculoskeletal:  Positive for TMJ; no myalgias, joint pain, or weakness Hematologic/Oncology: Negative; no easy bruising, bleeding Endocrine: Negative; no heat/cold intolerance; no diabetes Neuro: Negative; no changes in balance, headaches Skin: Negative; No rashes or skin lesions Psychiatric: Negative; No behavioral problems, depression Sleep: Positive for OSA; now back on CPAP with recent set up date February 22, 2018 Other comprehensive 14 point system review is negative.  PE BP 128/70   Pulse 68   Ht '5\' 1"'  (1.549 m)   Wt 159 lb 9.6 oz (72.4 kg)   BMI 30.16 kg/m   Repeat blood pressure by me 122/70  Wt Readings from Last 3 Encounters:  06/19/18 159 lb 9.6 oz (72.4 kg)  01/30/18 168 lb 12.8 oz (76.6 kg)  07/19/17 159 lb 6.4 oz (72.3 kg)   General: Alert, oriented, no distress.  Skin: normal turgor, no rashes, warm and dry HEENT: Normocephalic, atraumatic. Pupils equal round and reactive to light; sclera anicteric; extraocular muscles intact;  Nose without nasal septal hypertrophy Mouth/Parynx benign; Mallinpatti scale 3/4 Neck: No JVD, no carotid bruits; normal carotid upstroke Lungs: clear to ausculatation and percussion; no wheezing or rales Chest wall: without tenderness to palpitation Heart: PMI not displaced, RRR, s1 s2 normal, 1/6 systolic murmur, no diastolic murmur, no rubs, gallops, thrills, or heaves Abdomen: soft, nontender; no hepatosplenomehaly, BS+; abdominal aorta nontender and not dilated by palpation. Back: no CVA tenderness Pulses 2+ Musculoskeletal: full range of motion, normal strength, no joint deformities Extremities: no clubbing cyanosis or edema, Homan's sign negative  Neurologic: grossly nonfocal; Cranial nerves grossly wnl Psychologic: Normal mood and affect   ECG (independently read by me): Normal sinus rhythm at 68 bpm.  Poor R wave progression anterolaterally  June 2019 ECG (independently read by me): Sinus at 64 bpm.  Normal intervals.  No ectopy.  November  2018 ECG (independently read by me): Normal sinus rhythm at 62 bpm.  No syncope.  No CT changes.  QTc interval 456 ms  May 2018 ECG (independently read by me): Sinus bradycardia 57 bpm.  PR interval 146 and QTc interval 447 ms.  No ectopy.  May 2017 ECG (independently read by me): Normal sinus rhythm at 75 bpm.  LVH by voltage criteria.  No significant ST changes.  QTc interval 460 ms.  ECG (independently read by me): Sinus rhythm at 99 bpm.  Left axis deviation.  Poor progression anteriorly.  August 2015 ECG (independently read by me): Normal sinus rhythm at 70 beats per minute.  QTc interval 423 ms.  PR interval normal at 150 ms.  Prior ECG: Normal sinus rhythm at 92 beats per minute. Poor R wave progression.  LABS: BMP Latest Ref Rng & Units 08/21/2014 06/18/2013 10/29/2012  Glucose 70 - 99 mg/dL - 125(H) 195(H)  BUN 6 - 23 mg/dL - 24(H) 14  Creatinine 0.50 - 1.10 mg/dL 1.30(H) 1.08 1.06  Sodium 135 - 145 mEq/L - 139 140  Potassium 3.5 - 5.1 mEq/L - 4.2 4.1  Chloride 96 - 112 mEq/L - 104 103  CO2 19 - 32 mEq/L - 25 25  Calcium 8.4 - 10.5 mg/dL - 10.3 10.4   Hepatic  Function Latest Ref Rng & Units 11/07/2011  Total Protein 6.0 - 8.3 g/dL 6.6  Albumin 3.5 - 5.2 g/dL 3.3(L)  AST 0 - 37 U/L 36  ALT 0 - 35 U/L 52(H)  Alk Phosphatase 39 - 117 U/L 43  Total Bilirubin 0.3 - 1.2 mg/dL 0.3    CBC Latest Ref Rng & Units 06/19/2013 06/18/2013 06/18/2013  WBC 4.0 - 10.5 K/uL 10.6(H) 9.4 9.5  Hemoglobin 12.0 - 15.0 g/dL 11.6(L) 11.6(L) 12.7  Hematocrit 36.0 - 46.0 % 33.9(L) 34.1(L) 36.9  Platelets 150 - 400 K/uL 279 293 306   . Lab Results  Component Value Date   MCV 92.6 06/19/2013   MCV 92.4 06/18/2013   MCV 92.3 06/18/2013   No results found for: TSH No results found for: HGBA1C   Lipid Panel  No results found for: CHOL, TRIG, HDL, CHOLHDL, VLDL, LDLCALC, LDLDIRECT   RADIOLOGY: No results found.  IMPRESSION:  1. Obstructive sleep apnea (adult) (pediatric)   2. Essential  hypertension   3. Hyperlipidemia with target LDL less than 70   4. Membranous glomerulonephritis   5. Hypothyroidism, unspecified type   6. DM (diabetes mellitus) with complications [facility])     ASSESSMENT AND PLAN: Ms Albo is a 67 year old female who has a history of hypertension, hypothyroidism, GERD, obstructive sleep apnea, insulin-dependent diabetes mellitus as well as her nephrotic range proteinuria due to membranous glomerulonephritis.  Remotely she had been on combination therapy with  micardis 160 mg and tekturna 150 mg , both for blood pressure and her proteinuria.  Currently, her blood pressure today is controlled on diltiazem 240 mg, furosemide 80 mg daily, Toprol-XL 100 mg, in addition to telmisartan 80 mg daily.  She underwent reevaluation from [place] obstructive sleep apnea and compared to her prior evaluation her sleep apnea is more severe.  AHI was 44.1/h and in rem sleep was 74.4/h.  She had significant oxygen desaturation to 73% prior to initiation of these CPAP portion of her split-night protocol.  I reviewed her download and her most recent download does reveal that she is meeting compliance standards.  However, I have recommended that she utilize CPAP throughout the entire evening.  She has very severe sleep apnea during rem sleep and I discussed with her that the preponderance of rem sleep occurs in the second portion of the night.  She had severe oxygen desaturation to a nadir of 73% without therapy.  I reviewed her recent laboratory from [name].   Renal function appears stable.  She is anemic.  She continues to be on rosuvastatin 40 mg and fenofibrate for mixed hyperlipidemia with target LDL less than 70.  She continues to be on levothyroxine for hypothyroidism.  She is diabetic on insulin.  BMI is 30.16.  Weight loss and exercise was recommended.  I will see her in 6 months for reevaluation.  Time spent: 25 minutes  Troy Sine, MD, [facility]  06/21/2018 6:49 PM

## 2018-06-21 ENCOUNTER — Encounter: Payer: Self-pay | Admitting: Cardiovascular Disease

## 2018-06-23 ENCOUNTER — Telehealth: Payer: Self-pay | Admitting: *Deleted

## 2018-06-23 NOTE — Telephone Encounter (Signed)
Patient called to follow up on her 06/19/18 appointment with Dr. Claiborne Billings. She informs me that [facility] is waiting on FTF note. Also order for her to get a new mask. I told her that his nurse, Hayley placed the order into the Epic system on the day of her appointment. I sent a staff message to Jiles Crocker today informing him the mask order is in the system for the patient. 06/19/28 office note faxed to [facility] @ [phone] attention Tretha Sciara.

## 2018-07-24 DIAGNOSIS — E669 Obesity, unspecified: Secondary | ICD-10-CM | POA: Diagnosis not present

## 2018-07-24 DIAGNOSIS — M1A30X0 Chronic gout due to renal impairment, unspecified site, without tophus (tophi): Secondary | ICD-10-CM | POA: Diagnosis not present

## 2018-07-24 DIAGNOSIS — E039 Hypothyroidism, unspecified: Secondary | ICD-10-CM | POA: Diagnosis not present

## 2018-07-24 DIAGNOSIS — N2581 Secondary hyperparathyroidism of renal origin: Secondary | ICD-10-CM | POA: Diagnosis not present

## 2018-07-24 DIAGNOSIS — N183 Chronic kidney disease, stage 3 (moderate): Secondary | ICD-10-CM | POA: Diagnosis not present

## 2018-07-24 DIAGNOSIS — D631 Anemia in chronic kidney disease: Secondary | ICD-10-CM | POA: Diagnosis not present

## 2018-07-24 DIAGNOSIS — I129 Hypertensive chronic kidney disease with stage 1 through stage 4 chronic kidney disease, or unspecified chronic kidney disease: Secondary | ICD-10-CM | POA: Diagnosis not present

## 2018-07-24 DIAGNOSIS — R809 Proteinuria, unspecified: Secondary | ICD-10-CM | POA: Diagnosis not present

## 2018-07-24 DIAGNOSIS — N052 Unspecified nephritic syndrome with diffuse membranous glomerulonephritis: Secondary | ICD-10-CM | POA: Diagnosis not present

## 2018-07-24 DIAGNOSIS — E782 Mixed hyperlipidemia: Secondary | ICD-10-CM | POA: Diagnosis not present

## 2018-07-24 DIAGNOSIS — K760 Fatty (change of) liver, not elsewhere classified: Secondary | ICD-10-CM | POA: Diagnosis not present

## 2018-07-24 DIAGNOSIS — E118 Type 2 diabetes mellitus with unspecified complications: Secondary | ICD-10-CM | POA: Diagnosis not present

## 2018-07-25 DIAGNOSIS — H5203 Hypermetropia, bilateral: Secondary | ICD-10-CM | POA: Diagnosis not present

## 2018-07-25 DIAGNOSIS — H524 Presbyopia: Secondary | ICD-10-CM | POA: Diagnosis not present

## 2018-07-25 DIAGNOSIS — H52222 Regular astigmatism, left eye: Secondary | ICD-10-CM | POA: Diagnosis not present

## 2018-07-25 DIAGNOSIS — E119 Type 2 diabetes mellitus without complications: Secondary | ICD-10-CM | POA: Diagnosis not present

## 2018-08-02 ENCOUNTER — Telehealth: Payer: Self-pay | Admitting: Cardiovascular Disease

## 2018-08-02 NOTE — Telephone Encounter (Signed)
She wants to know if there is something that can speed up the process for her to get her supplies.  It has been since October and she still hasn't received her supplies for her CPAP.  When she calls [facility] they tell her she is under audit, and there is something they need from [place] that they haven't received yet from [place].

## 2018-08-02 NOTE — Telephone Encounter (Signed)
Left message that I called [facility] and spoke with Melissa, account manager and explained patient's concerned. Melissa checked into this and left me a message that all of the patient's paperwork is in order. They should not need any additional documentation. Melissa states that the re supply team will be reaching out to the patient. Patient was instructed that if she has not heard anything back from them by Friday 08/04/18 to call me back so that I can check the status.

## 2018-08-04 DIAGNOSIS — E1165 Type 2 diabetes mellitus with hyperglycemia: Secondary | ICD-10-CM | POA: Diagnosis not present

## 2018-08-04 DIAGNOSIS — E039 Hypothyroidism, unspecified: Secondary | ICD-10-CM | POA: Diagnosis not present

## 2018-08-04 DIAGNOSIS — I10 Essential (primary) hypertension: Secondary | ICD-10-CM | POA: Diagnosis not present

## 2018-08-04 DIAGNOSIS — N183 Chronic kidney disease, stage 3 (moderate): Secondary | ICD-10-CM | POA: Diagnosis not present

## 2018-08-04 DIAGNOSIS — E7800 Pure hypercholesterolemia, unspecified: Secondary | ICD-10-CM | POA: Diagnosis not present

## 2018-08-25 DIAGNOSIS — G4733 Obstructive sleep apnea (adult) (pediatric): Secondary | ICD-10-CM | POA: Diagnosis not present

## 2018-09-04 ENCOUNTER — Other Ambulatory Visit: Payer: Self-pay | Admitting: Cardiovascular Disease

## 2018-09-08 ENCOUNTER — Other Ambulatory Visit (HOSPITAL_COMMUNITY): Payer: Self-pay | Admitting: Family Medicine

## 2018-09-08 DIAGNOSIS — E1165 Type 2 diabetes mellitus with hyperglycemia: Secondary | ICD-10-CM | POA: Diagnosis not present

## 2018-09-08 DIAGNOSIS — Z1231 Encounter for screening mammogram for malignant neoplasm of breast: Secondary | ICD-10-CM

## 2018-09-10 DIAGNOSIS — R69 Illness, unspecified: Secondary | ICD-10-CM | POA: Diagnosis not present

## 2018-09-25 DIAGNOSIS — G4733 Obstructive sleep apnea (adult) (pediatric): Secondary | ICD-10-CM | POA: Diagnosis not present

## 2018-10-03 ENCOUNTER — Other Ambulatory Visit: Payer: Self-pay | Admitting: Cardiovascular Disease

## 2018-10-03 DIAGNOSIS — N2581 Secondary hyperparathyroidism of renal origin: Secondary | ICD-10-CM | POA: Diagnosis not present

## 2018-10-03 DIAGNOSIS — I129 Hypertensive chronic kidney disease with stage 1 through stage 4 chronic kidney disease, or unspecified chronic kidney disease: Secondary | ICD-10-CM | POA: Diagnosis not present

## 2018-10-03 DIAGNOSIS — K760 Fatty (change of) liver, not elsewhere classified: Secondary | ICD-10-CM | POA: Diagnosis not present

## 2018-10-03 DIAGNOSIS — R809 Proteinuria, unspecified: Secondary | ICD-10-CM | POA: Diagnosis not present

## 2018-10-03 DIAGNOSIS — E782 Mixed hyperlipidemia: Secondary | ICD-10-CM | POA: Diagnosis not present

## 2018-10-03 DIAGNOSIS — D631 Anemia in chronic kidney disease: Secondary | ICD-10-CM | POA: Diagnosis not present

## 2018-10-03 DIAGNOSIS — N052 Unspecified nephritic syndrome with diffuse membranous glomerulonephritis: Secondary | ICD-10-CM | POA: Diagnosis not present

## 2018-10-03 DIAGNOSIS — E669 Obesity, unspecified: Secondary | ICD-10-CM | POA: Diagnosis not present

## 2018-10-03 DIAGNOSIS — N183 Chronic kidney disease, stage 3 (moderate): Secondary | ICD-10-CM | POA: Diagnosis not present

## 2018-10-03 DIAGNOSIS — M1A30X0 Chronic gout due to renal impairment, unspecified site, without tophus (tophi): Secondary | ICD-10-CM | POA: Diagnosis not present

## 2018-10-09 DIAGNOSIS — E1165 Type 2 diabetes mellitus with hyperglycemia: Secondary | ICD-10-CM | POA: Diagnosis not present

## 2018-10-11 ENCOUNTER — Ambulatory Visit (HOSPITAL_COMMUNITY)
Admission: RE | Admit: 2018-10-11 | Discharge: 2018-10-11 | Disposition: A | Discharge status: Home / Self Care | Payer: Medicare HMO | Source: Ambulatory Visit | Attending: Family Medicine | Admitting: Family Medicine

## 2018-10-11 DIAGNOSIS — Z1231 Encounter for screening mammogram for malignant neoplasm of breast: Secondary | ICD-10-CM

## 2018-10-15 ENCOUNTER — Emergency Department (HOSPITAL_COMMUNITY)
Admission: EM | Admit: 2018-10-15 | Discharge: 2018-10-15 | Disposition: A | Discharge status: Home / Self Care | Payer: Medicare HMO | Source: Home / Self Care | Attending: Emergency Medicine | Admitting: Emergency Medicine

## 2018-10-15 ENCOUNTER — Emergency Department (HOSPITAL_COMMUNITY): Payer: Medicare HMO

## 2018-10-15 ENCOUNTER — Other Ambulatory Visit: Payer: Self-pay

## 2018-10-15 ENCOUNTER — Encounter (HOSPITAL_COMMUNITY): Payer: Self-pay | Admitting: Emergency Medicine

## 2018-10-15 DIAGNOSIS — K529 Noninfective gastroenteritis and colitis, unspecified: Secondary | ICD-10-CM | POA: Diagnosis not present

## 2018-10-15 DIAGNOSIS — R1084 Generalized abdominal pain: Secondary | ICD-10-CM | POA: Insufficient documentation

## 2018-10-15 DIAGNOSIS — I129 Hypertensive chronic kidney disease with stage 1 through stage 4 chronic kidney disease, or unspecified chronic kidney disease: Secondary | ICD-10-CM | POA: Insufficient documentation

## 2018-10-15 DIAGNOSIS — Z794 Long term (current) use of insulin: Secondary | ICD-10-CM | POA: Insufficient documentation

## 2018-10-15 DIAGNOSIS — A045 Campylobacter enteritis: Secondary | ICD-10-CM | POA: Diagnosis not present

## 2018-10-15 DIAGNOSIS — E119 Type 2 diabetes mellitus without complications: Secondary | ICD-10-CM

## 2018-10-15 DIAGNOSIS — R110 Nausea: Secondary | ICD-10-CM

## 2018-10-15 DIAGNOSIS — N179 Acute kidney failure, unspecified: Secondary | ICD-10-CM | POA: Diagnosis not present

## 2018-10-15 DIAGNOSIS — N189 Chronic kidney disease, unspecified: Secondary | ICD-10-CM

## 2018-10-15 DIAGNOSIS — R197 Diarrhea, unspecified: Secondary | ICD-10-CM | POA: Diagnosis not present

## 2018-10-15 DIAGNOSIS — Z79899 Other long term (current) drug therapy: Secondary | ICD-10-CM

## 2018-10-15 DIAGNOSIS — K5730 Diverticulosis of large intestine without perforation or abscess without bleeding: Secondary | ICD-10-CM | POA: Diagnosis not present

## 2018-10-15 DIAGNOSIS — E860 Dehydration: Secondary | ICD-10-CM | POA: Insufficient documentation

## 2018-10-15 DIAGNOSIS — E876 Hypokalemia: Secondary | ICD-10-CM | POA: Insufficient documentation

## 2018-10-15 DIAGNOSIS — K922 Gastrointestinal hemorrhage, unspecified: Secondary | ICD-10-CM | POA: Diagnosis not present

## 2018-10-15 DIAGNOSIS — A09 Infectious gastroenteritis and colitis, unspecified: Secondary | ICD-10-CM | POA: Diagnosis not present

## 2018-10-15 LAB — CBG MONITORING, ED: GLUCOSE-CAPILLARY: 202 mg/dL — AB (ref 70–99)

## 2018-10-15 LAB — COMPREHENSIVE METABOLIC PANEL
ALT: 15 U/L (ref 0–44)
ANION GAP: 12 (ref 5–15)
AST: 14 U/L — ABNORMAL LOW (ref 15–41)
Albumin: 3.2 g/dL — ABNORMAL LOW (ref 3.5–5.0)
Alkaline Phosphatase: 46 U/L (ref 38–126)
BUN: 21 mg/dL (ref 8–23)
CHLORIDE: 100 mmol/L (ref 98–111)
CO2: 21 mmol/L — ABNORMAL LOW (ref 22–32)
CREATININE: 2.13 mg/dL — AB (ref 0.44–1.00)
Calcium: 8.9 mg/dL (ref 8.9–10.3)
GFR, EST AFRICAN AMERICAN: 27 mL/min — AB (ref >60)
GFR, EST NON AFRICAN AMERICAN: 24 mL/min — AB (ref >60)
Glucose, Bld: 182 mg/dL — ABNORMAL HIGH (ref 70–99)
POTASSIUM: 2.9 mmol/L — AB (ref 3.5–5.1)
Sodium: 133 mmol/L — ABNORMAL LOW (ref 135–145)
Total Bilirubin: 0.4 mg/dL (ref 0.3–1.2)
Total Protein: 6.9 g/dL (ref 6.5–8.1)

## 2018-10-15 LAB — URINALYSIS, ROUTINE W REFLEX MICROSCOPIC
BACTERIA UA: NONE SEEN
Bilirubin Urine: NEGATIVE
KETONES UR: NEGATIVE mg/dL
LEUKOCYTE UA: NEGATIVE
NITRITE: NEGATIVE
Protein, ur: >=300 mg/dL — AB
Specific Gravity, Urine: 1.018 (ref 1.005–1.030)
pH: 5 (ref 5.0–8.0)

## 2018-10-15 LAB — CBC
HEMATOCRIT: 28.2 % — AB (ref 36.0–46.0)
Hemoglobin: 9.5 g/dL — ABNORMAL LOW (ref 12.0–15.0)
MCH: 36.1 pg — ABNORMAL HIGH (ref 26.0–34.0)
MCHC: 33.7 g/dL (ref 30.0–36.0)
MCV: 107.2 fL — ABNORMAL HIGH (ref 80.0–100.0)
NRBC: 0 % (ref 0.0–0.2)
Platelets: 215 10*3/uL (ref 150–400)
RBC: 2.63 MIL/uL — AB (ref 3.87–5.11)
RDW: 13.3 % (ref 11.5–15.5)
WBC: 4.1 10*3/uL (ref 4.0–10.5)

## 2018-10-15 LAB — LIPASE, BLOOD: Lipase: 20 U/L (ref 11–51)

## 2018-10-15 MED ORDER — ONDANSETRON HCL 4 MG/2ML IJ SOLN
4.0000 mg | Freq: Once | INTRAMUSCULAR | Status: AC
  Administered 2018-10-15: 4 mg via INTRAVENOUS
  Filled 2018-10-15: qty 2

## 2018-10-15 MED ORDER — IOPAMIDOL (ISOVUE-300) INJECTION 61%
30.0000 mL | Freq: Once | INTRAVENOUS | Status: AC | PRN
  Administered 2018-10-15: 30 mL via ORAL

## 2018-10-15 MED ORDER — SODIUM CHLORIDE 0.9 % IV BOLUS
1000.0000 mL | Freq: Once | INTRAVENOUS | Status: AC
  Administered 2018-10-15: 1000 mL via INTRAVENOUS

## 2018-10-15 MED ORDER — POTASSIUM CHLORIDE CRYS ER 20 MEQ PO TBCR
40.0000 meq | EXTENDED_RELEASE_TABLET | Freq: Once | ORAL | Status: AC
  Administered 2018-10-15: 40 meq via ORAL
  Filled 2018-10-15: qty 2

## 2018-10-15 MED ORDER — ONDANSETRON HCL 4 MG/2ML IJ SOLN: 4.0000 mg | Freq: Once | INTRAMUSCULAR | Status: DC | PRN

## 2018-10-15 MED ORDER — ONDANSETRON 4 MG PO TBDP: 4.0000 mg | ORAL_TABLET | Freq: Three times a day (TID) | ORAL | 0 refills | Status: DC | PRN

## 2018-10-15 MED ORDER — SODIUM CHLORIDE 0.9% FLUSH: 3.0000 mL | Freq: Once | INTRAVENOUS | Status: DC

## 2018-10-15 NOTE — ED Triage Notes (Signed)
Patient complains of nausea and vomiting x 4 days.

## 2018-10-15 NOTE — Discharge Instructions (Addendum)
You were evaluated in the Emergency Department and after careful evaluation, we did not find any emergent condition requiring admission or further testing in the hospital.  Your symptoms today seem to be due to a virus causing vomiting and diarrhea.  Your potassium levels were a bit low and there was some evidence of dehydration in your blood work.  Please drink plenty of fluids, use the nausea medication as needed, increase the potassium in your diet, follow-up with your regular doctor.  Please return to the Emergency Department if you experience any worsening of your condition.  We encourage you to follow up with a primary care provider.  Thank you for allowing [place] to be a part of your care.

## 2018-10-15 NOTE — ED Provider Notes (Signed)
[facility] Emergency Department Provider Note MRN:  [id number]  Arrival date & time: 10/15/18     Chief Complaint   Nausea; Diarrhea; and Abdominal Pain   History of Present Illness   [name] is a 68 y.o. year-old female with a history of membranous glomerulonephritis, CKD presenting to the ED with chief complaint of nausea, vomiting, diarrhea.  4 days of persistent watery diarrhea, abdominal cramping, discomfort, nausea, few episodes of nonbloody nonbilious emesis.  No blood in the stool, no recent antibiotics, no recent travel.  Subjective fever at home.  Denies chest pain or shortness of breath.  Symptoms are constant, no exacerbating relieving factors.  Review of Systems  A complete 10 system review of systems was obtained and all systems are negative except as noted in the HPI and PMH.   Patient's Health History    Past Medical History:  Diagnosis Date  . Anemia    "as a kid" (06/18/2013)  . Anginal pain ([place])    "often; usually when thyroid goes out of wack and heart starts fluttering" (06/18/2013)  . Arthritis    "joints probably" (101/27/2014)  . Carotid stenosis ~ 01/2013   "bilaterally; ~ 50%; dx'd/carotid US" (06/18/2013)  . Dysrhythmia    "palpitation-type feelings; related to thyroid" (06/18/2013)  . Exertional shortness of breath   . GERD (gastroesophageal reflux disease)   . Gout   . VHQIONGE(952.8)    "monthly" (06/18/2013)  . Heart murmur   . Hepatitis, unspecified 1972   "husband had hepatitis; my mother, daughter, and me all had to get the gammaglobulin shots" (06/18/2013)  . High cholesterol   . Hypertension   . Hypothyroidism   . Membranous glomerulonephritis   . Pneumonia ?1980's; 09/2011   "twice" (06/18/2013)  . Sleep apnea    "have mask; haven't worn it in ~ 1 yr" (06/18/2013)  . Type II diabetes mellitus ([place])     Past Surgical History:  Procedure Laterality Date  . BUNIONECTOMY Bilateral [id number]   "once on each  side" (06/18/2013)  . CESAREAN SECTION  1971; 1977  . CHOLECYSTECTOMY  ?1998  . COLONOSCOPY N/A 12/03/2015   Procedure: COLONOSCOPY;  Surgeon: Daneil Dolin, MD;  Location: [facility];  Service: Endoscopy;  Laterality: N/A;  8:30 AM  . RENAL BIOPSY Left ~ 1997; 06/18/2013  . SHOULDER ARTHROSCOPY W/ ROTATOR CUFF REPAIR Right 2009  . TONSILLECTOMY      Family History  Problem Relation Age of Onset  . Cirrhosis Mother 68       Non-alcoholic related  . Coronary artery disease Father     Social History   Socioeconomic History  . Marital status: Married    Spouse name: Not on file  . Number of children: Not on file  . Years of education: Not on file  . Highest education level: Not on file  Occupational History  . Not on file  Social Needs  . Financial resource strain: Not on file  . Food insecurity:    Worry: Not on file    Inability: Not on file  . Transportation needs:    Medical: Not on file    Non-medical: Not on file  Tobacco Use  . Smoking status: Never Smoker  . Smokeless tobacco: Never Used  Substance and Sexual Activity  . Alcohol use: No  . Drug use: No  . Sexual activity: Never  Lifestyle  . Physical activity:    Days per week: Not on file  Minutes per session: Not on file  . Stress: Not on file  Relationships  . Social connections:    Talks on phone: Not on file    Gets together: Not on file    Attends religious service: Not on file    Active member of club or organization: Not on file    Attends meetings of clubs or organizations: Not on file    Relationship status: Not on file  . Intimate partner violence:    Fear of current or ex partner: Not on file    Emotionally abused: Not on file    Physically abused: Not on file    Forced sexual activity: Not on file  Other Topics Concern  . Not on file  Social History Narrative  . Not on file     Physical Exam  Vital Signs and Nursing Notes reviewed Vitals:   10/15/18 1143  BP: (!) 115/49    Pulse: 76  Resp: 18  Temp: 100.1 F (37.8 C)  SpO2: 95%    CONSTITUTIONAL: Well-appearing, NAD NEURO:  Alert and oriented x 3, no focal deficits EYES:  eyes equal and reactive ENT/NECK:  no LAD, no JVD CARDIO: Regular rate, well-perfused, normal S1 and S2 PULM:  CTAB no wheezing or rhonchi GI/GU:  normal bowel sounds, non-distended, non-tender MSK/SPINE:  No gross deformities, no edema SKIN:  no rash, atraumatic PSYCH:  Appropriate speech and behavior  Diagnostic and Interventional Summary    EKG Interpretation  Date/Time:    Ventricular Rate:    PR Interval:    QRS Duration:   QT Interval:    QTC Calculation:   R Axis:     Text Interpretation:        Labs Reviewed  COMPREHENSIVE METABOLIC PANEL - Abnormal; Notable for the following components:      Result Value   Sodium 133 (*)    Potassium 2.9 (*)    CO2 21 (*)    Glucose, Bld 182 (*)    Creatinine, Ser 2.13 (*)    Albumin 3.2 (*)    AST 14 (*)    GFR calc non Af Amer 24 (*)    GFR calc Af Amer 27 (*)    All other components within normal limits  CBC - Abnormal; Notable for the following components:   RBC 2.63 (*)    Hemoglobin 9.5 (*)    HCT 28.2 (*)    MCV 107.2 (*)    MCH 36.1 (*)    All other components within normal limits  URINALYSIS, ROUTINE W REFLEX MICROSCOPIC - Abnormal; Notable for the following components:   Color, Urine AMBER (*)    APPearance CLOUDY (*)    Glucose, UA >=500 (*)    Hgb urine dipstick SMALL (*)    Protein, ur >=300 (*)    All other components within normal limits  CBG MONITORING, ED - Abnormal; Notable for the following components:   Glucose-Capillary 202 (*)    All other components within normal limits  LIPASE, BLOOD    CT ABDOMEN PELVIS WO CONTRAST  Final Result      Medications  sodium chloride flush (NS) 0.9 % injection 3 mL (has no administration in time range)  sodium chloride 0.9 % bolus 1,000 mL ( Intravenous Rate/Dose Verify 10/15/18 1359)  potassium  chloride SA (K-DUR,KLOR-CON) CR tablet 40 mEq (40 mEq Oral Given 10/15/18 1332)  ondansetron (ZOFRAN) injection 4 mg (4 mg Intravenous Given 10/15/18 1334)  iopamidol (ISOVUE-300) 61 % injection 30  mL (30 mLs Oral Contrast Given 10/15/18 1601)     Procedures Critical Care  ED Course and Medical Decision Making  I have reviewed the triage vital signs and the nursing notes.  Pertinent labs & imaging results that were available during my care of the patient were reviewed by me and considered in my medical decision making (see below for details).  Favoring viral gastroenteritis, but given patient's immunosuppressant use, CT to exclude more sinister pathology.  Labs reveal mild hypokalemia, repleted here in the ED.  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  CT reveals colitis, likely infectious, all consistent with viral illness.  Patient is feeling better and requesting discharge.  Strict return precautions for worsening symptoms.  After the discussed management above, the patient was determined to be safe for discharge.  The patient was in agreement with this plan and all questions regarding their care were answered.  ED return precautions were discussed and the patient will return to the ED with any significant worsening of condition.  Barth Kirks. Sedonia Small, [place] [email or web address]  Final Clinical Impressions(s) / ED Diagnoses     ICD-10-CM   1. Nausea R11.0   2. Diarrhea of presumed infectious origin R19.7   3. Dehydration E86.0   4. Hypokalemia E87.6     ED Discharge Orders         Ordered    ondansetron (ZOFRAN ODT) 4 MG disintegrating tablet  Every 8 hours PRN     10/15/18 1729             Maudie Flakes, MD 10/15/18 1730

## 2018-10-17 ENCOUNTER — Encounter (HOSPITAL_COMMUNITY): Payer: Self-pay | Admitting: Emergency Medicine

## 2018-10-17 ENCOUNTER — Inpatient Hospital Stay (HOSPITAL_COMMUNITY)
Admission: EM | Admit: 2018-10-17 | Discharge: 2018-10-19 | DRG: 372 | Disposition: A | Discharge status: Home / Self Care | Payer: Medicare HMO | Attending: Internal Medicine | Admitting: Internal Medicine

## 2018-10-17 ENCOUNTER — Other Ambulatory Visit: Payer: Self-pay

## 2018-10-17 DIAGNOSIS — E860 Dehydration: Secondary | ICD-10-CM | POA: Diagnosis present

## 2018-10-17 DIAGNOSIS — Z888 Allergy status to other drugs, medicaments and biological substances status: Secondary | ICD-10-CM

## 2018-10-17 DIAGNOSIS — K529 Noninfective gastroenteritis and colitis, unspecified: Secondary | ICD-10-CM | POA: Diagnosis not present

## 2018-10-17 DIAGNOSIS — Z8701 Personal history of pneumonia (recurrent): Secondary | ICD-10-CM

## 2018-10-17 DIAGNOSIS — N183 Chronic kidney disease, stage 3 unspecified: Secondary | ICD-10-CM | POA: Diagnosis present

## 2018-10-17 DIAGNOSIS — R197 Diarrhea, unspecified: Secondary | ICD-10-CM | POA: Diagnosis not present

## 2018-10-17 DIAGNOSIS — I10 Essential (primary) hypertension: Secondary | ICD-10-CM | POA: Diagnosis present

## 2018-10-17 DIAGNOSIS — M109 Gout, unspecified: Secondary | ICD-10-CM | POA: Diagnosis present

## 2018-10-17 DIAGNOSIS — R112 Nausea with vomiting, unspecified: Secondary | ICD-10-CM | POA: Diagnosis present

## 2018-10-17 DIAGNOSIS — D638 Anemia in other chronic diseases classified elsewhere: Secondary | ICD-10-CM | POA: Diagnosis present

## 2018-10-17 DIAGNOSIS — E119 Type 2 diabetes mellitus without complications: Secondary | ICD-10-CM

## 2018-10-17 DIAGNOSIS — A045 Campylobacter enteritis: Principal | ICD-10-CM | POA: Diagnosis present

## 2018-10-17 DIAGNOSIS — Z90710 Acquired absence of both cervix and uterus: Secondary | ICD-10-CM

## 2018-10-17 DIAGNOSIS — E039 Hypothyroidism, unspecified: Secondary | ICD-10-CM | POA: Diagnosis present

## 2018-10-17 DIAGNOSIS — N179 Acute kidney failure, unspecified: Secondary | ICD-10-CM | POA: Diagnosis not present

## 2018-10-17 DIAGNOSIS — D899 Disorder involving the immune mechanism, unspecified: Secondary | ICD-10-CM | POA: Diagnosis present

## 2018-10-17 DIAGNOSIS — Z885 Allergy status to narcotic agent status: Secondary | ICD-10-CM

## 2018-10-17 DIAGNOSIS — E1121 Type 2 diabetes mellitus with diabetic nephropathy: Secondary | ICD-10-CM

## 2018-10-17 DIAGNOSIS — Z79899 Other long term (current) drug therapy: Secondary | ICD-10-CM

## 2018-10-17 DIAGNOSIS — I129 Hypertensive chronic kidney disease with stage 1 through stage 4 chronic kidney disease, or unspecified chronic kidney disease: Secondary | ICD-10-CM | POA: Diagnosis present

## 2018-10-17 DIAGNOSIS — N052 Unspecified nephritic syndrome with diffuse membranous glomerulonephritis: Secondary | ICD-10-CM | POA: Diagnosis present

## 2018-10-17 DIAGNOSIS — Z794 Long term (current) use of insulin: Secondary | ICD-10-CM

## 2018-10-17 DIAGNOSIS — Z79890 Hormone replacement therapy (postmenopausal): Secondary | ICD-10-CM

## 2018-10-17 DIAGNOSIS — E876 Hypokalemia: Secondary | ICD-10-CM

## 2018-10-17 DIAGNOSIS — E7800 Pure hypercholesterolemia, unspecified: Secondary | ICD-10-CM | POA: Diagnosis present

## 2018-10-17 DIAGNOSIS — Z23 Encounter for immunization: Secondary | ICD-10-CM

## 2018-10-17 DIAGNOSIS — Z7982 Long term (current) use of aspirin: Secondary | ICD-10-CM

## 2018-10-17 DIAGNOSIS — E785 Hyperlipidemia, unspecified: Secondary | ICD-10-CM | POA: Diagnosis present

## 2018-10-17 DIAGNOSIS — Z9049 Acquired absence of other specified parts of digestive tract: Secondary | ICD-10-CM

## 2018-10-17 DIAGNOSIS — K219 Gastro-esophageal reflux disease without esophagitis: Secondary | ICD-10-CM | POA: Diagnosis present

## 2018-10-17 DIAGNOSIS — K922 Gastrointestinal hemorrhage, unspecified: Secondary | ICD-10-CM

## 2018-10-17 DIAGNOSIS — E1122 Type 2 diabetes mellitus with diabetic chronic kidney disease: Secondary | ICD-10-CM | POA: Diagnosis present

## 2018-10-17 DIAGNOSIS — G4733 Obstructive sleep apnea (adult) (pediatric): Secondary | ICD-10-CM | POA: Diagnosis present

## 2018-10-17 DIAGNOSIS — Z8249 Family history of ischemic heart disease and other diseases of the circulatory system: Secondary | ICD-10-CM

## 2018-10-17 LAB — COMPREHENSIVE METABOLIC PANEL
ALT: 13 U/L (ref 0–44)
AST: 13 U/L — ABNORMAL LOW (ref 15–41)
Albumin: 2.5 g/dL — ABNORMAL LOW (ref 3.5–5.0)
Alkaline Phosphatase: 40 U/L (ref 38–126)
Anion gap: 15 (ref 5–15)
BUN: 24 mg/dL — ABNORMAL HIGH (ref 8–23)
CO2: 18 mmol/L — AB (ref 22–32)
Calcium: 8.7 mg/dL — ABNORMAL LOW (ref 8.9–10.3)
Chloride: 99 mmol/L (ref 98–111)
Creatinine, Ser: 2.6 mg/dL — ABNORMAL HIGH (ref 0.44–1.00)
GFR, EST AFRICAN AMERICAN: 21 mL/min — AB (ref >60)
GFR, EST NON AFRICAN AMERICAN: 18 mL/min — AB (ref >60)
Glucose, Bld: 142 mg/dL — ABNORMAL HIGH (ref 70–99)
Potassium: 2.8 mmol/L — ABNORMAL LOW (ref 3.5–5.1)
SODIUM: 132 mmol/L — AB (ref 135–145)
Total Bilirubin: 0.6 mg/dL (ref 0.3–1.2)
Total Protein: 5.8 g/dL — ABNORMAL LOW (ref 6.5–8.1)

## 2018-10-17 LAB — C DIFFICILE QUICK SCREEN W PCR REFLEX
C Diff antigen: NEGATIVE
C Diff interpretation: NOT DETECTED
C Diff toxin: NEGATIVE

## 2018-10-17 LAB — GASTROINTESTINAL PANEL BY PCR, STOOL (REPLACES STOOL CULTURE)

## 2018-10-17 LAB — GLUCOSE, CAPILLARY
GLUCOSE-CAPILLARY: 112 mg/dL — AB (ref 70–99)
Glucose-Capillary: 134 mg/dL — ABNORMAL HIGH (ref 70–99)
Glucose-Capillary: 74 mg/dL (ref 70–99)

## 2018-10-17 LAB — CBC WITH DIFFERENTIAL/PLATELET
Abs Immature Granulocytes: 0.17 10*3/uL — ABNORMAL HIGH (ref 0.00–0.07)
Basophils Absolute: 0.1 10*3/uL (ref 0.0–0.1)
Basophils Relative: 1 %
Eosinophils Absolute: 0.1 10*3/uL (ref 0.0–0.5)
Eosinophils Relative: 1 %
HCT: 27 % — ABNORMAL LOW (ref 36.0–46.0)
Hemoglobin: 9.2 g/dL — ABNORMAL LOW (ref 12.0–15.0)
IMMATURE GRANULOCYTES: 2 %
LYMPHS PCT: 10 %
Lymphs Abs: 0.7 10*3/uL (ref 0.7–4.0)
MCH: 36.8 pg — ABNORMAL HIGH (ref 26.0–34.0)
MCHC: 34.1 g/dL (ref 30.0–36.0)
MCV: 108 fL — ABNORMAL HIGH (ref 80.0–100.0)
Monocytes Absolute: 1.4 10*3/uL — ABNORMAL HIGH (ref 0.1–1.0)
Monocytes Relative: 20 %
NEUTROS ABS: 4.7 10*3/uL (ref 1.7–7.7)
Neutrophils Relative %: 66 %
Platelets: 203 10*3/uL (ref 150–400)
RBC: 2.5 MIL/uL — ABNORMAL LOW (ref 3.87–5.11)
RDW: 13.8 % (ref 11.5–15.5)
WBC: 7.1 10*3/uL (ref 4.0–10.5)
nRBC: 0 % (ref 0.0–0.2)

## 2018-10-17 LAB — TYPE AND SCREEN
ABO/RH(D): B POS
ANTIBODY SCREEN: NEGATIVE

## 2018-10-17 LAB — LIPASE, BLOOD: Lipase: 17 U/L (ref 11–51)

## 2018-10-17 LAB — POC OCCULT BLOOD, ED: Fecal Occult Bld: POSITIVE — AB

## 2018-10-17 MED ORDER — INSULIN ASPART 100 UNIT/ML ~~LOC~~ SOLN
0.0000 [IU] | Freq: Three times a day (TID) | SUBCUTANEOUS | Status: DC
  Administered 2018-10-17: 1 [IU] via SUBCUTANEOUS

## 2018-10-17 MED ORDER — ONDANSETRON HCL 4 MG/2ML IJ SOLN: 4.0000 mg | Freq: Four times a day (QID) | INTRAMUSCULAR | Status: DC | PRN

## 2018-10-17 MED ORDER — ROSUVASTATIN CALCIUM 20 MG PO TABS
40.0000 mg | ORAL_TABLET | Freq: Every day | ORAL | Status: DC
  Administered 2018-10-17 – 2018-10-18 (×2): 40 mg via ORAL
  Filled 2018-10-17 (×2): qty 2

## 2018-10-17 MED ORDER — ROPINIROLE HCL 1 MG PO TABS
1.0000 mg | ORAL_TABLET | Freq: Every day | ORAL | Status: DC
  Administered 2018-10-17 – 2018-10-18 (×2): 1 mg via ORAL
  Filled 2018-10-17 (×2): qty 1

## 2018-10-17 MED ORDER — ALLOPURINOL 300 MG PO TABS
300.0000 mg | ORAL_TABLET | Freq: Every day | ORAL | Status: DC
  Administered 2018-10-17 – 2018-10-19 (×3): 300 mg via ORAL
  Filled 2018-10-17 (×3): qty 1

## 2018-10-17 MED ORDER — INSULIN DETEMIR 100 UNIT/ML ~~LOC~~ SOLN
10.0000 [IU] | Freq: Two times a day (BID) | SUBCUTANEOUS | Status: DC
  Administered 2018-10-17 – 2018-10-19 (×5): 10 [IU] via SUBCUTANEOUS
  Filled 2018-10-17 (×7): qty 0.1

## 2018-10-17 MED ORDER — POTASSIUM CHLORIDE 10 MEQ/100ML IV SOLN
10.0000 meq | Freq: Once | INTRAVENOUS | Status: AC
  Administered 2018-10-17: 10 meq via INTRAVENOUS

## 2018-10-17 MED ORDER — CYCLOPHOSPHAMIDE 50 MG PO CAPS: 50.0000 mg | ORAL_CAPSULE | Freq: Every day | ORAL | Status: DC

## 2018-10-17 MED ORDER — ONDANSETRON HCL 4 MG PO TABS: 4.0000 mg | ORAL_TABLET | Freq: Four times a day (QID) | ORAL | Status: DC | PRN

## 2018-10-17 MED ORDER — POTASSIUM CHLORIDE CRYS ER 20 MEQ PO TBCR
30.0000 meq | EXTENDED_RELEASE_TABLET | Freq: Two times a day (BID) | ORAL | Status: AC
  Administered 2018-10-17 – 2018-10-18 (×4): 30 meq via ORAL
  Filled 2018-10-17 (×5): qty 1

## 2018-10-17 MED ORDER — ACETAMINOPHEN 650 MG RE SUPP: 650.0000 mg | Freq: Four times a day (QID) | RECTAL | Status: DC | PRN

## 2018-10-17 MED ORDER — SODIUM CHLORIDE 0.9 % IV SOLN
INTRAVENOUS | Status: DC
  Administered 2018-10-17 – 2018-10-18 (×3): via INTRAVENOUS

## 2018-10-17 MED ORDER — INSULIN ASPART 100 UNIT/ML ~~LOC~~ SOLN: 0.0000 [IU] | Freq: Every day | SUBCUTANEOUS | Status: DC

## 2018-10-17 MED ORDER — POTASSIUM CHLORIDE 10 MEQ/100ML IV SOLN
10.0000 meq | Freq: Once | INTRAVENOUS | Status: DC
  Filled 2018-10-17: qty 100

## 2018-10-17 MED ORDER — METOPROLOL SUCCINATE ER 100 MG PO TB24
100.0000 mg | ORAL_TABLET | Freq: Every day | ORAL | Status: DC
  Administered 2018-10-19: 100 mg via ORAL
  Filled 2018-10-17 (×3): qty 1

## 2018-10-17 MED ORDER — ACETAMINOPHEN 325 MG PO TABS: 650.0000 mg | ORAL_TABLET | Freq: Four times a day (QID) | ORAL | Status: DC | PRN

## 2018-10-17 MED ORDER — LEVOTHYROXINE SODIUM 75 MCG PO TABS
175.0000 ug | ORAL_TABLET | Freq: Every day | ORAL | Status: DC
  Administered 2018-10-17 – 2018-10-19 (×3): 175 ug via ORAL
  Filled 2018-10-17 (×3): qty 1

## 2018-10-17 MED ORDER — PANTOPRAZOLE SODIUM 40 MG PO TBEC
40.0000 mg | DELAYED_RELEASE_TABLET | Freq: Every day | ORAL | Status: DC
  Administered 2018-10-17 – 2018-10-19 (×4): 40 mg via ORAL
  Filled 2018-10-17 (×4): qty 1

## 2018-10-17 MED ORDER — AZITHROMYCIN 500 MG PO TABS
500.0000 mg | ORAL_TABLET | Freq: Every day | ORAL | Status: AC
  Administered 2018-10-17 – 2018-10-19 (×3): 500 mg via ORAL
  Filled 2018-10-17: qty 1
  Filled 2018-10-17 (×2): qty 2

## 2018-10-17 MED ORDER — SODIUM CHLORIDE 0.9 % IV BOLUS
500.0000 mL | Freq: Once | INTRAVENOUS | Status: AC
  Administered 2018-10-17: 500 mL via INTRAVENOUS

## 2018-10-17 MED ORDER — ASPIRIN EC 81 MG PO TBEC
81.0000 mg | DELAYED_RELEASE_TABLET | Freq: Every day | ORAL | Status: DC
  Administered 2018-10-17 – 2018-10-19 (×3): 81 mg via ORAL
  Filled 2018-10-17 (×3): qty 1

## 2018-10-17 NOTE — Progress Notes (Signed)
CRITICAL VALUE ALERT  Critical Value:  Stool tested positive for campylobacter  Date & Time Notied:  Oct 17, 2018  4:03  Provider Notified: Dr. Barb Merino  Orders Received/Actions taken: awaiting orders

## 2018-10-17 NOTE — H&P (Signed)
History and Physical    [name] YDX:[id number] DOB: [birth year] DOA: 10/17/2018  PCP: Sharilyn Sites, MD  Patient coming from: home    I have personally briefly reviewed patient's old medical records available.   Chief Complaint: Nausea vomiting diarrhea for 6 days.  HPI: [name] is a 68 y.o. female with medical history significant of anemia of chronic disease, GERD, gout, membranous glomerulonephritis currently on cyclophosphamide, CKD stage III, sleep apnea untreated and hypothyroidism presenting to the emergency room with about 6 days of nausea, multiple episodes of loose watery stool.  Patient started the symptoms about 6 days ago, she initially had nausea and followed by lower abdominal cramping, intermittent pain, occasionally 9 out of 10, no aggravating or relieving factors some relief with bowel movements.  She had about 10-12 bowel movements a day, mostly watery, did not notice any blood, mucus initially.  Today morning, she had noticed some blood along with bowel movement on her commode.  Visited ER 2 days ago, treatment advised symptomatically, continues to have watery stool along with some blood noticed today so came back to the ER. Denies any fever or chills.  Ongoing nausea, drinking only liquids.  No recent travel, hiking camping.  No sick contacts.  Whole family had similar episode in December after Christmas and everybody recovered without any problems.  Last colonoscopy in 2016 and was normal. ED Course: Clinically dehydrated.  Low normal blood pressures.  Otherwise hemodynamically stable.  Creatinine 2.6 from 2.1, 2 days ago.  Hemoglobin is 9.2 which is at about her baseline.  Stool C. difficile and GI pathogen panel was sent.  CT scan without contrast on 10/15/2018 shows sigmoid and cecal area mucosal thickness consistent with localized colitis.  Patient was given 500 mL of fluid bolus and 20 mEq of IV potassium in the ER.  Review of Systems: As per HPI otherwise 10 point  review of systems negative.    Past Medical History:  Diagnosis Date  . Anemia    "as a kid" (06/18/2013)  . Anginal pain ([place])    "often; usually when thyroid goes out of wack and heart starts fluttering" (06/18/2013)  . Arthritis    "joints probably" (101/27/2014)  . Carotid stenosis ~ 01/2013   "bilaterally; ~ 50%; dx'd/carotid US" (06/18/2013)  . Dysrhythmia    "palpitation-type feelings; related to thyroid" (06/18/2013)  . Exertional shortness of breath   . GERD (gastroesophageal reflux disease)   . Gout   . HMCNOBSJ(628.3)    "monthly" (06/18/2013)  . Heart murmur   . Hepatitis, unspecified 1972   "husband had hepatitis; my mother, daughter, and me all had to get the gammaglobulin shots" (06/18/2013)  . High cholesterol   . Hypertension   . Hypothyroidism   . Membranous glomerulonephritis   . Pneumonia ?1980's; 09/2011   "twice" (06/18/2013)  . Sleep apnea    "have mask; haven't worn it in ~ 1 yr" (06/18/2013)  . Type II diabetes mellitus ([place])     Past Surgical History:  Procedure Laterality Date  . BUNIONECTOMY Bilateral [id number]   "once on each side" (06/18/2013)  . CESAREAN SECTION  1971; 1977  . CHOLECYSTECTOMY  ?1998  . COLONOSCOPY N/A 12/03/2015   Procedure: COLONOSCOPY;  Surgeon: Daneil Dolin, MD;  Location[facility];  Service: Endoscopy;  Laterality: N/A;  8:30 AM  . RENAL BIOPSY Left ~ 1997; 06/18/2013  . SHOULDER ARTHROSCOPY W/ ROTATOR CUFF REPAIR Right 2009  . TONSILLECTOMY  reports that she has never smoked. She has never used smokeless tobacco. She reports that she does not drink alcohol or use drugs.  Allergies  Allergen Reactions  . Codeine Nausea And Vomiting  . Ibuprofen Other (See Comments)    Per kidney MD.  . Nsaids Other (See Comments)    Per Kidney DR. No NSAIDS  . Shellfish Allergy Other (See Comments)    REACTION: Gout Flare-ups   . Septra Ds [Sulfamethoxazole-Trimethoprim] Itching and Rash    Family History    Problem Relation Age of Onset  . Cirrhosis Mother 30       Non-alcoholic related  . Coronary artery disease Father      Prior to Admission medications   Medication Sig Start Date End Date Taking? Authorizing Provider  allopurinol (ZYLOPRIM) 300 MG tablet Take 300 mg by mouth daily.   Yes [provider]  aspirin 81 MG tablet Take 81 mg by mouth daily.   Yes [provider]  cetirizine (ZYRTEC) 10 MG tablet Take 10 mg by mouth at bedtime.   Yes [provider]  cyclophosphamide (CYTOXAN) 50 MG capsule Take 75 mg by mouth daily.   Yes [provider]  diltiazem (CARDIZEM CD) 240 MG 24 hr capsule Take 240 mg by mouth at bedtime.   Yes [provider]  Esomeprazole Magnesium (NEXIUM PO) Take 40 mg by mouth daily.    Yes [provider]  fenofibrate (TRICOR) 145 MG tablet Take 145 mg by mouth at bedtime.    Yes [provider]  furosemide (LASIX) 40 MG tablet Take 80 mg by mouth daily.    Yes [provider]  insulin aspart (NOVOLOG) 100 UNIT/ML injection Inject 3-8 Units into the skin 3 (three) times daily before meals. On sliding scale   Yes [provider]  insulin detemir (LEVEMIR) 100 UNIT/ML injection Inject 40 Units into the skin daily. 25 Units AM; 10 units PM   Yes [provider]  metoprolol succinate (TOPROL-XL) 100 MG 24 hr tablet TAKE 1 TABLET BY MOUTH EVERY DAY WITH FOOD Patient taking differently: Take 100 mg by mouth daily.  10/03/18  Yes Troy Sine, MD  Multiple Vitamin (MULTIVITAMIN WITH MINERALS) TABS tablet Take 1 tablet by mouth daily. Reported on 10/28/2015   Yes [provider]  nitroGLYCERIN (NITROSTAT) 0.4 MG SL tablet PLACE 1 TABLET (0.4 MG TOTAL) UNDER THE TONGUE EVERY 5 (FIVE) MINUTES AS NEEDED FOR CHEST PAIN. 09/04/18 12/03/18 Yes Troy Sine, MD  ondansetron (ZOFRAN ODT) 4 MG disintegrating tablet Take 1 tablet (4 mg total) by mouth every 8 (eight) hours as needed  for nausea or vomiting. 10/15/18  Yes Maudie Flakes, MD  ondansetron (ZOFRAN) 4 MG tablet Take 4 mg by mouth every 8 (eight) hours as needed for nausea or vomiting.   Yes [provider]  rOPINIRole (REQUIP) 0.5 MG tablet Take 1 mg by mouth at bedtime.    Yes [provider]  rosuvastatin (CRESTOR) 40 MG tablet Take 40 mg by mouth at bedtime.   Yes [provider]  SYNTHROID 175 MCG tablet Take 175 mcg by mouth daily.  05/15/15  Yes [provider]  telmisartan (MICARDIS) 80 MG tablet Take 160 mg by mouth at bedtime.    Yes [provider]    Physical Exam: Vitals:   10/17/18 0900 10/17/18 0915 10/17/18 0930 10/17/18 1000  BP: (!) 98/43 97/71 (!) 101/44 (!) 110/58  Pulse: 74 77 74 81  Resp: Marland Kitchen)  34 18 (!) 22 16  Temp:      TempSrc:      SpO2: 90% 92% 93% 98%  Weight:      Height:        Constitutional: NAD, calm, comfortable Vitals:   10/17/18 0900 10/17/18 0915 10/17/18 0930 10/17/18 1000  BP: (!) 98/43 97/71 (!) 101/44 (!) 110/58  Pulse: 74 77 74 81  Resp: (!) 34 18 (!) 22 16  Temp:      TempSrc:      SpO2: 90% 92% 93% 98%  Weight:      Height:       Eyes: PERRL, lids and conjunctivae normal ENMT: Mucous membranes are dry. Posterior pharynx clear of any exudate or lesions.Normal dentition.  Neck: normal, supple, no masses, no thyromegaly Respiratory: clear to auscultation bilaterally, no wheezing, no crackles. Normal respiratory effort. No accessory muscle use.  Cardiovascular: Regular rate and rhythm, no murmurs / rubs / gallops. No extremity edema. 2+ pedal pulses. No carotid bruits.  Abdomen: no tenderness, no masses palpated. No hepatosplenomegaly. Bowel sounds positive.  No rigidity or tenderness.  No guarding. Musculoskeletal: no clubbing / cyanosis. No joint deformity upper and lower extremities. Good ROM, no contractures. Normal muscle tone.  Skin: no rashes, lesions, ulcers. No induration Neurologic: CN 2-12 grossly  intact. Sensation intact, DTR normal. Strength 5/5 in all 4.  Psychiatric: Normal judgment and insight. Alert and oriented x 3. Normal mood.     Labs on Admission: I have personally reviewed following labs and imaging studies  CBC: Recent Labs  Lab 10/15/18 1230 10/17/18 0750  WBC 4.1 7.1  NEUTROABS  --  4.7  HGB 9.5* 9.2*  HCT 28.2* 27.0*  MCV 107.2* 108.0*  PLT 215 841   Basic Metabolic Panel: Recent Labs  Lab 10/15/18 1230 10/17/18 0750  NA 133* 132*  K 2.9* 2.8*  CL 100 99  CO2 21* 18*  GLUCOSE 182* 142*  BUN 21 24*  CREATININE 2.13* 2.60*  CALCIUM 8.9 8.7*   GFR: Estimated Creatinine Clearance: 19.2 mL/min (A) (by C-G formula based on SCr of 2.6 mg/dL (H)). Liver Function Tests: Recent Labs  Lab 10/15/18 1230 10/17/18 0750  AST 14* 13*  ALT 15 13  ALKPHOS 46 40  BILITOT 0.4 0.6  PROT 6.9 5.8*  ALBUMIN 3.2* 2.5*   Recent Labs  Lab 10/15/18 1230 10/17/18 0750  LIPASE 20 17   No results for input(s): AMMONIA in the last 168 hours. Coagulation Profile: No results for input(s): INR, PROTIME in the last 168 hours. Cardiac Enzymes: No results for input(s): CKTOTAL, CKMB, CKMBINDEX, TROPONINI in the last 168 hours. BNP (last 3 results) No results for input(s): PROBNP in the last 8760 hours. HbA1C: No results for input(s): HGBA1C in the last 72 hours. CBG: Recent Labs  Lab 10/15/18 1148  GLUCAP 202*   Lipid Profile: No results for input(s): CHOL, HDL, LDLCALC, TRIG, CHOLHDL, LDLDIRECT in the last 72 hours. Thyroid Function Tests: No results for input(s): TSH, T4TOTAL, FREET4, T3FREE, THYROIDAB in the last 72 hours. Anemia Panel: No results for input(s): VITAMINB12, FOLATE, FERRITIN, TIBC, IRON, RETICCTPCT in the last 72 hours. Urine analysis:    Component Value Date/Time   COLORURINE AMBER (A) 10/15/2018 1146   APPEARANCEUR CLOUDY (A) 10/15/2018 1146   LABSPEC 1.018 10/15/2018 1146   PHURINE 5.0 10/15/2018 1146   GLUCOSEU >=500 (A)  10/15/2018 1146   HGBUR SMALL (A) 10/15/2018 1146   BILIRUBINUR NEGATIVE 10/15/2018 1146   KETONESUR NEGATIVE  10/15/2018 1146   PROTEINUR >=300 (A) 10/15/2018 1146   NITRITE NEGATIVE 10/15/2018 [place] 10/15/2018 1146    Radiological Exams on Admission: Ct Abdomen Pelvis Wo Contrast  Result Date: 10/15/2018 CLINICAL DATA:  Nausea vomiting for 4 days. Immunosuppressed patient. EXAM: CT ABDOMEN AND PELVIS WITHOUT CONTRAST TECHNIQUE: Multidetector CT imaging of the abdomen and pelvis was performed following the standard protocol without IV contrast. COMPARISON:  None. FINDINGS: Lower chest: Calcific atherosclerotic disease of the coronary arteries. Hepatobiliary: No focal liver abnormality is seen. Status post cholecystectomy. No biliary dilatation. Pancreas: Unremarkable. No pancreatic ductal dilatation or surrounding inflammatory changes. Spleen: Splenic granulomata. Adrenals/Urinary Tract: Adrenal glands are unremarkable. Kidneys are normal, without renal calculi, focal lesion, or hydronephrosis. Bladder is unremarkable. Stomach/Bowel: Decompressed stomach. No small bowel obstruction. Diffuse circumferential mucosal thickening of the cecum and sigmoid colon, with less pronounced mucosal edema of the transverse colon. Diffuse diverticulosis of the sigmoid colon. Normal appendix. Vascular/Lymphatic: Aortic atherosclerosis. No enlarged abdominal or pelvic lymph nodes. Shotty mesenteric lymph nodes, most pronounced in the right lower quadrant. Reproductive: Status post hysterectomy. No adnexal masses. Other: No abdominal wall hernia or abnormality. No abdominopelvic ascites. Musculoskeletal: Spondylosis of the spine. IMPRESSION: 1. Diffuse circumferential mucosal thickening of the cecum and sigmoid colon, with less pronounced mucosal edema of the transverse colon. Findings are consistent with colitis, likely infectious or inflammatory. 2. Sigmoid diverticulosis, likely not the reason for  colitis. 3. Shotty mesenteric lymph nodes, most pronounced in the right lower quadrant, likely reactive. 4. Calcific atherosclerotic disease of the coronary arteries and aorta. Electronically Signed   By: Fidela Salisbury M.D.   On: 10/15/2018 16:36    Assessment/Plan Principal Problem:   Nausea vomiting and diarrhea Active Problems:   HTN (hypertension)   Membranous glomerulonephritis   Hypothyroidism   GERD (gastroesophageal reflux disease)   OSA (obstructive sleep apnea)   Hyperlipidemia   DM2 (diabetes mellitus, type 2) (HCC)   Hypokalemia   Acute renal failure superimposed on stage 3 chronic kidney disease ([place])     1.  Nausea, vomiting and diarrhea: Suspect infectious colitis.  No evidence of bacterial infection at this time.  Will not start any antibiotics, rather wait for C. difficile and GI pathogen panel results. Agree with admission given severity of symptoms.  Will admit with IV fluids, clear liquids and advance as tolerated, replace electrolytes.  Will use Imodium if C. difficile is negative.  Enteric precautions until results are back. She had a small blood mixed in stool which is probably due to colitis, if persistent or drop in hemoglobin, may need sigmoidoscopy and will consult GI.  Recheck hemoglobin in the morning.  2.  Acute renal failure superimposed on chronic kidney disease stage III due to membranous glomerulonephritis: IV fluid hydration.  Hold diuretics and ARB.  Patient is on cyclophosphamide, will continue in the hospital.  Followed by nephrologist as outpatient.  3.  Hypokalemia: Persistent due to GI loss.  Replace aggressively with IV and oral and recheck levels.  We will also check magnesium and phosphorus levels.  4.  GERD: On PPI.  Continue.  5.  Type 2 diabetes on insulin: Continue long-acting insulin and sliding scale insulin as she is doing.  6.  Hypertension: Blood pressures are soft and low normal.  Will continue beta-blockers, however will  hold other antihypertensives.  7.  Hypothyroidism: Synthroid to be continued.  Sleep apnea: Untreated.   DVT prophylaxis: SCDs. Code Status: Full code. Family Communication: Husband at the  bedside. Disposition Plan: Home when is stable.  Anticipate tomorrow. Consults called: None. Admission status: Observation.   Barb Merino MD [facility] Pager [phone]  If 7PM-7AM, please contact night-coverage [email or web address] Password [facility]  10/17/2018, 10:18 AM

## 2018-10-17 NOTE — ED Triage Notes (Signed)
Pt reports abd pain, nausea and diarrhea that started about one week ago. Pt reports some blood in the stool that started this morning.

## 2018-10-17 NOTE — Care Management Obs Status (Signed)
[place] NOTIFICATION   Patient Details  Name: [name] MRN: [id number] Date of Birth: 06-16-1951   Medicare Observation Status Notification Given: Yes      Norina Buzzard, RN 10/17/2018, 12:42 PM

## 2018-10-17 NOTE — Progress Notes (Signed)
Stool examination positive for Campylobacter.  Persistent symptoms with abdominal cramp, loose stool for 6 days now.  She is immunosuppressed on cyclophosphamide.  Given persistent symptoms and immunosuppressed status, patient is to be treated with azithromycin 500 mg 1 a day for 3 days and if she continues to have symptoms she will continue this.

## 2018-10-17 NOTE — ED Provider Notes (Signed)
[facility] EMERGENCY DEPARTMENT Provider Note   CSN: [id number] Arrival date & time: 10/17/18  0705    History   Chief Complaint Chief Complaint  Patient presents with  . Rectal Bleeding  . Abdominal Pain    HPI [name] is a 68 y.o. female.     Patient with onset of nausea and diarrhea and dry heaves on Wednesday seen at [facility] on February 23.  Had labs done and had CT scan done which showed some large intestine inflammatory changes.  Patient has had persistent diarrhea not improving.  Patient noticed some blood in her bowel movements today that was streaky in nature.  Patient still has abdominal cramping but usually just right before a bowel movement.  No significant or severe pain.  Patient has not been on antibiotics recently.     Past Medical History:  Diagnosis Date  . Anemia    "as a kid" (06/18/2013)  . Anginal pain ([place])    "often; usually when thyroid goes out of wack and heart starts fluttering" (06/18/2013)  . Arthritis    "joints probably" (101/27/2014)  . Carotid stenosis ~ 01/2013   "bilaterally; ~ 50%; dx'd/carotid US" (06/18/2013)  . Dysrhythmia    "palpitation-type feelings; related to thyroid" (06/18/2013)  . Exertional shortness of breath   . GERD (gastroesophageal reflux disease)   . Gout   . AJOINOMV(672.0)    "monthly" (06/18/2013)  . Heart murmur   . Hepatitis, unspecified 1972   "husband had hepatitis; my mother, daughter, and me all had to get the gammaglobulin shots" (06/18/2013)  . High cholesterol   . Hypertension   . Hypothyroidism   . Membranous glomerulonephritis   . Pneumonia ?1980's; 09/2011   "twice" (06/18/2013)  . Sleep apnea    "have mask; haven't worn it in ~ 1 yr" (06/18/2013)  . Type II diabetes mellitus [facility])     Patient Active Problem List   Diagnosis Date Noted  . History of colonic polyps   . Diverticulosis of colon without hemorrhage   . DM (diabetes mellitus) with complications ([place])  94/70/9628  . HTN (hypertension) 04/08/2013  . Membranous glomerulonephritis 04/08/2013  . Hypothyroidism 04/08/2013  . GERD (gastroesophageal reflux disease) 04/08/2013  . OSA (obstructive sleep apnea) 04/08/2013  . Hyperlipidemia 04/08/2013  . DM2 (diabetes mellitus, type 2) ([place]) 04/08/2013    Past Surgical History:  Procedure Laterality Date  . BUNIONECTOMY Bilateral [id number]   "once on each side" (06/18/2013)  . CESAREAN SECTION  1971; 1977  . CHOLECYSTECTOMY  ?1998  . COLONOSCOPY N/A 12/03/2015   Procedure: COLONOSCOPY;  Surgeon: Daneil Dolin, MD;  Location: [facility] ENDO SUITE;  Service: Endoscopy;  Laterality: N/A;  8:30 AM  . RENAL BIOPSY Left ~ 1997; 06/18/2013  . SHOULDER ARTHROSCOPY W/ ROTATOR CUFF REPAIR Right 2009  . TONSILLECTOMY       OB History   No obstetric history on file.      Home Medications    Prior to Admission medications   Medication Sig Start Date End Date Taking? Authorizing Provider  allopurinol (ZYLOPRIM) 300 MG tablet Take 300 mg by mouth daily.    [provider]  aspirin 81 MG tablet Take 81 mg by mouth daily.    [provider]  cetirizine (ZYRTEC) 10 MG tablet Take 10 mg by mouth at bedtime.    [provider]  cyclophosphamide (CYTOXAN) 50 MG capsule Take 75 mg by mouth daily.    [provider]  diltiazem (CARDIZEM CD) 240 MG 24 hr capsule Take 240 mg by mouth at bedtime.    [provider]  Esomeprazole Magnesium (NEXIUM PO) Take 1 tablet by mouth daily.    [provider]  fenofibrate (TRICOR) 145 MG tablet Take 145 mg by mouth at bedtime.     [provider]  furosemide (LASIX) 40 MG tablet Take 80 mg by mouth daily.     [provider]  insulin aspart (NOVOLOG) 100 UNIT/ML injection Inject 3-8 Units into the skin 3 (three) times daily before meals. On sliding scale    [provider]  insulin detemir (LEVEMIR) 100 UNIT/ML injection Inject 40 Units into the  skin daily. 25 Units AM; 10 units PM    [provider]  metoprolol succinate (TOPROL-XL) 100 MG 24 hr tablet TAKE 1 TABLET BY MOUTH EVERY DAY WITH FOOD 10/03/18   Troy Sine, MD  Multiple Vitamin (MULTIVITAMIN WITH MINERALS) TABS tablet Take 1 tablet by mouth daily. Reported on 10/28/2015    [provider]  nitroGLYCERIN (NITROSTAT) 0.4 MG SL tablet PLACE 1 TABLET (0.4 MG TOTAL) UNDER THE TONGUE EVERY 5 (FIVE) MINUTES AS NEEDED FOR CHEST PAIN. 09/04/18 12/03/18  Troy Sine, MD  ondansetron (ZOFRAN ODT) 4 MG disintegrating tablet Take 1 tablet (4 mg total) by mouth every 8 (eight) hours as needed for nausea or vomiting. 10/15/18   Maudie Flakes, MD  ondansetron (ZOFRAN) 4 MG tablet Take 4 mg by mouth every 8 (eight) hours as needed for nausea or vomiting.    [provider]  rOPINIRole (REQUIP) 0.5 MG tablet Take 1 mg by mouth at bedtime.     [provider]  rosuvastatin (CRESTOR) 40 MG tablet Take 40 mg by mouth at bedtime.    [provider]  SYNTHROID 175 MCG tablet Take 175 mcg by mouth daily.  05/15/15   [provider]  telmisartan (MICARDIS) 80 MG tablet Take 160 mg by mouth at bedtime.     [provider]    Family History Family History  Problem Relation Age of Onset  . Cirrhosis Mother 32       Non-alcoholic related  . Coronary artery disease Father     Social History Social History   Tobacco Use  . Smoking status: Never Smoker  . Smokeless tobacco: Never Used  Substance Use Topics  . Alcohol use: No  . Drug use: No     Allergies   Codeine; Ibuprofen; Nsaids; Shellfish allergy; and Septra ds [sulfamethoxazole-trimethoprim]   Review of Systems Review of Systems  Constitutional: Negative for chills and fever.  HENT: Negative for rhinorrhea and sore throat.   Eyes: Negative for visual disturbance.  Respiratory: Negative for cough and shortness of breath.   Cardiovascular: Negative for chest pain and  leg swelling.  Gastrointestinal: Positive for abdominal pain, blood in stool, diarrhea and nausea. Negative for vomiting.  Genitourinary: Negative for dysuria.  Musculoskeletal: Negative for back pain and neck pain.  Skin: Negative for rash.  Neurological: Negative for dizziness, light-headedness and headaches.  Hematological: Does not bruise/bleed easily.  Psychiatric/Behavioral: Negative for confusion.     Physical Exam Updated Vital Signs BP (!) 109/50 (BP Location: Right Arm)   Pulse 83   Temp 99 F (37.2 C) (Oral)   Resp 16   Ht 1.549 m (5\' 1" )   Wt 71.2 kg   SpO2 97%   BMI 29.66 kg/m   Physical Exam Vitals signs and nursing note  reviewed.  Constitutional:      General: She is not in acute distress.    Appearance: She is well-developed.  HENT:     Head: Normocephalic and atraumatic.     Mouth/Throat:     Mouth: Mucous membranes are dry.  Eyes:     Extraocular Movements: Extraocular movements intact.     Conjunctiva/sclera: Conjunctivae normal.     Pupils: Pupils are equal, round, and reactive to light.  Neck:     Musculoskeletal: Neck supple.  Cardiovascular:     Rate and Rhythm: Normal rate and regular rhythm.     Heart sounds: No murmur.  Pulmonary:     Effort: Pulmonary effort is normal. No respiratory distress.     Breath sounds: Normal breath sounds.  Abdominal:     General: Bowel sounds are normal. There is no distension.     Palpations: Abdomen is soft.     Tenderness: There is no abdominal tenderness.  Genitourinary:    Rectum: Guaiac result positive.  Musculoskeletal:        General: Swelling present.  Skin:    General: Skin is warm and dry.  Neurological:     Mental Status: She is alert.      ED Treatments / Results  Labs (all labs ordered are listed, but only abnormal results are displayed) Labs Reviewed  COMPREHENSIVE METABOLIC PANEL - Abnormal; Notable for the following components:      Result Value   Sodium 132 (*)    Potassium  2.8 (*)    CO2 18 (*)    Glucose, Bld 142 (*)    BUN 24 (*)    Creatinine, Ser 2.60 (*)    Calcium 8.7 (*)    Total Protein 5.8 (*)    Albumin 2.5 (*)    AST 13 (*)    GFR calc non Af Amer 18 (*)    GFR calc Af Amer 21 (*)    All other components within normal limits  CBC WITH DIFFERENTIAL/PLATELET - Abnormal; Notable for the following components:   RBC 2.50 (*)    Hemoglobin 9.2 (*)    HCT 27.0 (*)    MCV 108.0 (*)    MCH 36.8 (*)    Monocytes Absolute 1.4 (*)    Abs Immature Granulocytes 0.17 (*)    All other components within normal limits  POC OCCULT BLOOD, ED - Abnormal; Notable for the following components:   Fecal Occult Bld POSITIVE (*)    All other components within normal limits  GASTROINTESTINAL PANEL BY PCR, STOOL (REPLACES STOOL CULTURE)  C DIFFICILE QUICK SCREEN W PCR REFLEX  LIPASE, BLOOD  TYPE AND SCREEN    EKG None  Radiology Ct Abdomen Pelvis Wo Contrast  Result Date: 10/15/2018 CLINICAL DATA:  Nausea vomiting for 4 days. Immunosuppressed patient. EXAM: CT ABDOMEN AND PELVIS WITHOUT CONTRAST TECHNIQUE: Multidetector CT imaging of the abdomen and pelvis was performed following the standard protocol without IV contrast. COMPARISON:  None. FINDINGS: Lower chest: Calcific atherosclerotic disease of the coronary arteries. Hepatobiliary: No focal liver abnormality is seen. Status post cholecystectomy. No biliary dilatation. Pancreas: Unremarkable. No pancreatic ductal dilatation or surrounding inflammatory changes. Spleen: Splenic granulomata. Adrenals/Urinary Tract: Adrenal glands are unremarkable. Kidneys are normal, without renal calculi, focal lesion, or hydronephrosis. Bladder is unremarkable. Stomach/Bowel: Decompressed stomach. No small bowel obstruction. Diffuse circumferential mucosal thickening of the cecum and sigmoid colon, with less pronounced mucosal edema of the transverse colon. Diffuse diverticulosis of the sigmoid colon. Normal appendix.  Vascular/Lymphatic: Aortic atherosclerosis. No enlarged abdominal or pelvic lymph nodes. Shotty mesenteric lymph nodes, most pronounced in the right lower quadrant. Reproductive: Status post hysterectomy. No adnexal masses. Other: No abdominal wall hernia or abnormality. No abdominopelvic ascites. Musculoskeletal: Spondylosis of the spine. IMPRESSION: 1. Diffuse circumferential mucosal thickening of the cecum and sigmoid colon, with less pronounced mucosal edema of the transverse colon. Findings are consistent with colitis, likely infectious or inflammatory. 2. Sigmoid diverticulosis, likely not the reason for colitis. 3. Shotty mesenteric lymph nodes, most pronounced in the right lower quadrant, likely reactive. 4. Calcific atherosclerotic disease of the coronary arteries and aorta. Electronically Signed   By: Fidela Salisbury M.D.   On: 10/15/2018 16:36    Procedures Procedures (including critical care time)  CRITICAL CARE Performed by: Fredia Sorrow Total critical care time: 30 minutes Critical care time was exclusive of separately billable procedures and treating other patients. Critical care was necessary to treat or prevent imminent or life-threatening deterioration. Critical care was time spent personally by me on the following activities: development of treatment plan with patient and/or surrogate as well as nursing, discussions with consultants, evaluation of patient's response to treatment, examination of patient, obtaining history from patient or surrogate, ordering and performing treatments and interventions, ordering and review of laboratory studies, ordering and review of radiographic studies, pulse oximetry and re-evaluation of patient's condition.   Medications Ordered in ED Medications  0.9 %  sodium chloride infusion (has no administration in time range)  potassium chloride 10 mEq in 100 mL IVPB (has no administration in time range)  potassium chloride 10 mEq in 100 mL IVPB  (has no administration in time range)  sodium chloride 0.9 % bolus 500 mL (500 mLs Intravenous New Bag/Given 10/17/18 0754)     Initial Impression / Assessment and Plan / ED Course  I have reviewed the triage vital signs and the nursing notes.  Pertinent labs & imaging results that were available during my care of the patient were reviewed by me and considered in my medical decision making (see chart for details).        Patient now with persistent diarrhea and nausea and dry heaves since Wednesday.  Seen at [facility] on February 23 had CT scan done at that time which showed some inflammation in the large intestines probably infectious in nature.  Patient has not improved since that time.  On that day her potassium and her kidney function was a little marginal.  Patient started to have some blood in her bowel movements today.  No large amount of blood.  Here vital signs without any significant findings potassium a little bit lower at 2.8.  And her creatinine is up to 2.6.  Probably little bit of prerenal on top of her known renal insufficiency.  Stool cultures were sent as well as C. difficile.  Her the low potassium patient received 10 mEq of potassium IV x2.  Patient also received 500 cc normal saline bolus.  Feel that patient would benefit from following her hemoglobins which were stable as of today and also gently getting fluids to correct her acute kidney injury which is probably prerenal and to correct her potassium.  Hospitalist will admit.  Final Clinical Impressions(s) / ED Diagnoses   Final diagnoses:  Colitis  Hypokalemia  Gastrointestinal hemorrhage, unspecified gastrointestinal hemorrhage type  AKI (acute kidney injury) [facility])    ED Discharge Orders    None       Fredia Sorrow, MD 10/17/18 [phone]

## 2018-10-18 DIAGNOSIS — I129 Hypertensive chronic kidney disease with stage 1 through stage 4 chronic kidney disease, or unspecified chronic kidney disease: Secondary | ICD-10-CM | POA: Diagnosis not present

## 2018-10-18 DIAGNOSIS — I10 Essential (primary) hypertension: Secondary | ICD-10-CM | POA: Diagnosis not present

## 2018-10-18 DIAGNOSIS — K922 Gastrointestinal hemorrhage, unspecified: Secondary | ICD-10-CM

## 2018-10-18 DIAGNOSIS — E1122 Type 2 diabetes mellitus with diabetic chronic kidney disease: Secondary | ICD-10-CM | POA: Diagnosis not present

## 2018-10-18 DIAGNOSIS — N183 Chronic kidney disease, stage 3 (moderate): Secondary | ICD-10-CM | POA: Diagnosis not present

## 2018-10-18 DIAGNOSIS — G4733 Obstructive sleep apnea (adult) (pediatric): Secondary | ICD-10-CM | POA: Diagnosis not present

## 2018-10-18 DIAGNOSIS — Z8249 Family history of ischemic heart disease and other diseases of the circulatory system: Secondary | ICD-10-CM | POA: Diagnosis not present

## 2018-10-18 DIAGNOSIS — N052 Unspecified nephritic syndrome with diffuse membranous glomerulonephritis: Secondary | ICD-10-CM | POA: Diagnosis not present

## 2018-10-18 DIAGNOSIS — Z9049 Acquired absence of other specified parts of digestive tract: Secondary | ICD-10-CM | POA: Diagnosis not present

## 2018-10-18 DIAGNOSIS — N179 Acute kidney failure, unspecified: Secondary | ICD-10-CM | POA: Diagnosis not present

## 2018-10-18 DIAGNOSIS — E7800 Pure hypercholesterolemia, unspecified: Secondary | ICD-10-CM | POA: Diagnosis not present

## 2018-10-18 DIAGNOSIS — E785 Hyperlipidemia, unspecified: Secondary | ICD-10-CM | POA: Diagnosis present

## 2018-10-18 DIAGNOSIS — Z79890 Hormone replacement therapy (postmenopausal): Secondary | ICD-10-CM | POA: Diagnosis not present

## 2018-10-18 DIAGNOSIS — M109 Gout, unspecified: Secondary | ICD-10-CM | POA: Diagnosis not present

## 2018-10-18 DIAGNOSIS — E1121 Type 2 diabetes mellitus with diabetic nephropathy: Secondary | ICD-10-CM | POA: Diagnosis not present

## 2018-10-18 DIAGNOSIS — R197 Diarrhea, unspecified: Secondary | ICD-10-CM | POA: Diagnosis not present

## 2018-10-18 DIAGNOSIS — D899 Disorder involving the immune mechanism, unspecified: Secondary | ICD-10-CM | POA: Diagnosis present

## 2018-10-18 DIAGNOSIS — Z885 Allergy status to narcotic agent status: Secondary | ICD-10-CM | POA: Diagnosis not present

## 2018-10-18 DIAGNOSIS — Z23 Encounter for immunization: Secondary | ICD-10-CM | POA: Diagnosis not present

## 2018-10-18 DIAGNOSIS — E039 Hypothyroidism, unspecified: Secondary | ICD-10-CM | POA: Diagnosis present

## 2018-10-18 DIAGNOSIS — Z888 Allergy status to other drugs, medicaments and biological substances status: Secondary | ICD-10-CM | POA: Diagnosis not present

## 2018-10-18 DIAGNOSIS — A045 Campylobacter enteritis: Secondary | ICD-10-CM | POA: Diagnosis not present

## 2018-10-18 DIAGNOSIS — Z79899 Other long term (current) drug therapy: Secondary | ICD-10-CM | POA: Diagnosis not present

## 2018-10-18 DIAGNOSIS — Z794 Long term (current) use of insulin: Secondary | ICD-10-CM | POA: Diagnosis not present

## 2018-10-18 DIAGNOSIS — K219 Gastro-esophageal reflux disease without esophagitis: Secondary | ICD-10-CM | POA: Diagnosis not present

## 2018-10-18 DIAGNOSIS — R112 Nausea with vomiting, unspecified: Secondary | ICD-10-CM | POA: Diagnosis not present

## 2018-10-18 DIAGNOSIS — Z7982 Long term (current) use of aspirin: Secondary | ICD-10-CM | POA: Diagnosis not present

## 2018-10-18 DIAGNOSIS — E876 Hypokalemia: Secondary | ICD-10-CM | POA: Diagnosis present

## 2018-10-18 DIAGNOSIS — D638 Anemia in other chronic diseases classified elsewhere: Secondary | ICD-10-CM | POA: Diagnosis present

## 2018-10-18 DIAGNOSIS — E860 Dehydration: Secondary | ICD-10-CM | POA: Diagnosis present

## 2018-10-18 LAB — GLUCOSE, CAPILLARY
Glucose-Capillary: 91 mg/dL (ref 70–99)
Glucose-Capillary: 113 mg/dL — ABNORMAL HIGH (ref 70–99)
Glucose-Capillary: 106 mg/dL — ABNORMAL HIGH (ref 70–99)
Glucose-Capillary: 145 mg/dL — ABNORMAL HIGH (ref 70–99)

## 2018-10-18 LAB — CBC
HCT: 22 % — ABNORMAL LOW (ref 36.0–46.0)
Hemoglobin: 7.6 g/dL — ABNORMAL LOW (ref 12.0–15.0)
MCH: 36.5 pg — ABNORMAL HIGH (ref 26.0–34.0)
MCHC: 34.5 g/dL (ref 30.0–36.0)
MCV: 105.8 fL — AB (ref 80.0–100.0)
Platelets: 203 10*3/uL (ref 150–400)
RBC: 2.08 MIL/uL — ABNORMAL LOW (ref 3.87–5.11)
RDW: 13.6 % (ref 11.5–15.5)
WBC: 5.8 10*3/uL (ref 4.0–10.5)
nRBC: 0 % (ref 0.0–0.2)

## 2018-10-18 LAB — BASIC METABOLIC PANEL
Anion gap: 11 (ref 5–15)
BUN: 20 mg/dL (ref 8–23)
CO2: 20 mmol/L — AB (ref 22–32)
Calcium: 8.7 mg/dL — ABNORMAL LOW (ref 8.9–10.3)
Chloride: 108 mmol/L (ref 98–111)
Creatinine, Ser: 2.07 mg/dL — ABNORMAL HIGH (ref 0.44–1.00)
GFR calc Af Amer: 28 mL/min — ABNORMAL LOW (ref >60)
GFR calc non Af Amer: 24 mL/min — ABNORMAL LOW (ref >60)
Glucose, Bld: 90 mg/dL (ref 70–99)
Potassium: 3 mmol/L — ABNORMAL LOW (ref 3.5–5.1)
Sodium: 139 mmol/L (ref 135–145)

## 2018-10-18 LAB — MAGNESIUM: Magnesium: 1.9 mg/dL (ref 1.7–2.4)

## 2018-10-18 LAB — PHOSPHORUS: Phosphorus: 3 mg/dL (ref 2.5–4.6)

## 2018-10-18 LAB — HIV ANTIBODY (ROUTINE TESTING W REFLEX): HIV Screen 4th Generation wRfx: NONREACTIVE

## 2018-10-18 MED ORDER — POTASSIUM CHLORIDE IN NACL 20-0.9 MEQ/L-% IV SOLN
INTRAVENOUS | Status: DC
  Administered 2018-10-18 – 2018-10-19 (×2): via INTRAVENOUS
  Filled 2018-10-18 (×2): qty 1000

## 2018-10-18 NOTE — Care Management Obs Status (Signed)
[facility] NOTIFICATION   Patient Details  Name: [name] MRN: [id number] Date of Birth: 05/26/1951   Medicare Observation Status Notification Given:  Yes    Carles Collet, RN 10/18/2018, 9:53 AM

## 2018-10-18 NOTE — Progress Notes (Signed)
Progress Note    [name]  XTG:[id number] DOB: 1951/05/27  DOA: 10/17/2018 PCP: Sharilyn Sites, MD    Brief Narrative:     Medical records reviewed and are as summarized below:  [name] is an 68 y.o. female with medical history significant of anemia of chronic disease, GERD, gout, membranous glomerulonephritis currently on cyclophosphamide, CKD stage III, sleep apnea untreated and hypothyroidism presenting to the emergency room with about 6 days of nausea, multiple episodes of loose watery stool.  Found to have campylobacter in stool.   Assessment/Plan:   Principal Problem:   Nausea vomiting and diarrhea Active Problems:   HTN (hypertension)   Membranous glomerulonephritis   Hypothyroidism   GERD (gastroesophageal reflux disease)   OSA (obstructive sleep apnea)   Hyperlipidemia   DM2 (diabetes mellitus, type 2) (HCC)   Hypokalemia   Acute renal failure superimposed on stage 3 chronic kidney disease (HCC)   Campylobacter enteritis  Campylobacter enteritis  -due to immunocompromised status, will treat with azithromycin  Anemia -probably acute blood loss from GI bleeding -transfuse for < 7  Acute renal failure superimposed on chronic kidney disease stage III due to membranous glomerulonephritis: -IV fluid hydration.  -baseline per renal is 1.4-1.6 - Hold diuretics and ARB.  -patient follows with Dr. Jimmy Footman and plan is to hold the cyclophosphamide while in hospital   Hypokalemia:  -Persistent due to GI loss.   -Replace aggressively with IV and oral and recheck levels -magnesium in normal range  GERD: On PPI.  Continue.  Type 2 diabetes on insulin:  Continue long-acting insulin and sliding scale insulin  Hypertension -holding parameter on BB -BP low  Hypothyroidism:  Synthroid to be continued.    Family Communication/Anticipated D/C date and plan/Code Status   DVT prophylaxis: scd Code Status: Full Code.  Family Communication: husband  at bedside Disposition Plan: home once diarrhea stopped and Cr improved   Medical Consultants:    None.    Subjective:   Still having loose stools but no longer blood in it  Objective:    Vitals:   10/17/18 1150 10/17/18 1644 10/17/18 2329 10/18/18 0459  BP:  (!) 113/51 (!) 143/73 (!) 99/51  Pulse:  77 (!) 53 75  Resp:  18 18 18   Temp:  98 F (36.7 C) 97.6 F (36.4 C) 98.4 F (36.9 C)  TempSrc:  Oral Oral Oral  SpO2:  98% 98% 98%  Weight: 69.9 kg     Height: 5\' 1"  (1.549 m)       Intake/Output Summary (Last 24 hours) at 10/18/2018 1141 Last data filed at 10/18/2018 0802 Gross per 24 hour  Intake 4711.63 ml  Output -  Net 4711.63 ml   Filed Weights   10/17/18 0726 10/17/18 1150  Weight: 71.2 kg 69.9 kg    Exam: In bed, ill appearing rrr +BS, soft, NT No increased work of breathing A+Ox3  Data Reviewed:   I have personally reviewed following labs and imaging studies:  Labs: Labs show the following:   Basic Metabolic Panel: Recent Labs  Lab 10/15/18 1230 10/17/18 0750 10/18/18 0359  NA 133* 132* 139  K 2.9* 2.8* 3.0*  CL 100 99 108  CO2 21* 18* 20*  GLUCOSE 182* 142* 90  BUN 21 24* 20  CREATININE 2.13* 2.60* 2.07*  CALCIUM 8.9 8.7* 8.7*  MG  --   --  1.9  PHOS  --   --  3.0   GFR Estimated Creatinine Clearance: 23.9 mL/min (  A) (by C-G formula based on SCr of 2.07 mg/dL (H)). Liver Function Tests: Recent Labs  Lab 10/15/18 1230 10/17/18 0750  AST 14* 13*  ALT 15 13  ALKPHOS 46 40  BILITOT 0.4 0.6  PROT 6.9 5.8*  ALBUMIN 3.2* 2.5*   Recent Labs  Lab 10/15/18 1230 10/17/18 0750  LIPASE 20 17   No results for input(s): AMMONIA in the last 168 hours. Coagulation profile No results for input(s): INR, PROTIME in the last 168 hours.  CBC: Recent Labs  Lab 10/15/18 1230 10/17/18 0750 10/18/18 0359  WBC 4.1 7.1 5.8  NEUTROABS  --  4.7  --   HGB 9.5* 9.2* 7.6*  HCT 28.2* 27.0* 22.0*  MCV 107.2* 108.0* 105.8*  PLT 215 203  203   Cardiac Enzymes: No results for input(s): CKTOTAL, CKMB, CKMBINDEX, TROPONINI in the last 168 hours. BNP (last 3 results) No results for input(s): PROBNP in the last 8760 hours. CBG: Recent Labs  Lab 10/17/18 1144 10/17/18 1642 10/17/18 2120 10/18/18 0645 10/18/18 1113  GLUCAP 112* 134* 74 91 113*   D-Dimer: No results for input(s): DDIMER in the last 72 hours. Hgb A1c: No results for input(s): HGBA1C in the last 72 hours. Lipid Profile: No results for input(s): CHOL, HDL, LDLCALC, TRIG, CHOLHDL, LDLDIRECT in the last 72 hours. Thyroid function studies: No results for input(s): TSH, T4TOTAL, T3FREE, THYROIDAB in the last 72 hours.  Invalid input(s): FREET3 Anemia work up: No results for input(s): VITAMINB12, FOLATE, FERRITIN, TIBC, IRON, RETICCTPCT in the last 72 hours. Sepsis Labs: Recent Labs  Lab 10/15/18 1230 10/17/18 0750 10/18/18 0359  WBC 4.1 7.1 5.8    Microbiology Recent Results (from the past 240 hour(s))  Gastrointestinal Panel by PCR , Stool     Status: Abnormal   Collection Time: 10/17/18  7:50 AM  Result Value Ref Range Status   Campylobacter species DETECTED (A) NOT DETECTED Final    Comment: RESULT CALLED TO, READ BACK BY AND VERIFIED WITH: HEATHER POOL AT 1603 ON 10/17/2018 [place].    Plesimonas shigelloides NOT DETECTED NOT DETECTED Final   Salmonella species NOT DETECTED NOT DETECTED Final   Yersinia enterocolitica NOT DETECTED NOT DETECTED Final   Vibrio species NOT DETECTED NOT DETECTED Final   Vibrio cholerae NOT DETECTED NOT DETECTED Final   Enteroaggregative E coli (EAEC) NOT DETECTED NOT DETECTED Final   Enteropathogenic E coli (EPEC) NOT DETECTED NOT DETECTED Final   Enterotoxigenic E coli (ETEC) NOT DETECTED NOT DETECTED Final   Shiga like toxin producing E coli (STEC) NOT DETECTED NOT DETECTED Final   Shigella/Enteroinvasive E coli (EIEC) NOT DETECTED NOT DETECTED Final   Cryptosporidium NOT DETECTED NOT DETECTED Final    Cyclospora cayetanensis NOT DETECTED NOT DETECTED Final   Entamoeba histolytica NOT DETECTED NOT DETECTED Final   Giardia lamblia NOT DETECTED NOT DETECTED Final   Adenovirus F40/41 NOT DETECTED NOT DETECTED Final   Astrovirus NOT DETECTED NOT DETECTED Final   Norovirus GI/GII NOT DETECTED NOT DETECTED Final   Rotavirus A NOT DETECTED NOT DETECTED Final   Sapovirus (I, II, IV, and V) NOT DETECTED NOT DETECTED Final    Comment: Performed at [facility], [address] [place],  [address]  C Difficile Quick Screen w PCR reflex     Status: None   Collection Time: 10/17/18  7:50 AM  Result Value Ref Range Status   C Diff antigen NEGATIVE NEGATIVE Final   C Diff toxin NEGATIVE NEGATIVE Final   C  Diff interpretation No C. difficile detected.  Final    Comment: Performed at [facility], [place] [address] [place], [place] [address]    Procedures and diagnostic studies:  No results found.  Medications:   . allopurinol  300 mg Oral Daily  . aspirin EC  81 mg Oral Daily  . azithromycin  500 mg Oral Daily  . insulin aspart  0-5 Units Subcutaneous QHS  . insulin aspart  0-9 Units Subcutaneous TID WC  . insulin detemir  10 Units Subcutaneous BID  . levothyroxine  175 mcg Oral QAC breakfast  . metoprolol succinate  100 mg Oral Daily  . pantoprazole  40 mg Oral Daily  . potassium chloride  30 mEq Oral BID  . rOPINIRole  1 mg Oral QHS  . rosuvastatin  40 mg Oral QHS   Continuous Infusions: . 0.9 % NaCl with KCl 20 mEq / L 75 mL/hr at 10/18/18 1104  . potassium chloride       LOS: 0 days   Geradine Girt  [facility]   How to contact the [facility] Attending or Consulting provider [place] or covering provider during after hours [place], for this patient?  1. Check the care team in [facility] and look for a) attending/consulting [facility] provider listed and b) the [facility] team listed 2. Log into [email or web address] and use 's universal password to access. If you do not have  the password, please contact the hospital operator. 3. Locate the [facility] provider you are looking for under [facility] and page to a number that you can be directly reached. 4. If you still have difficulty reaching the provider, please page the [facility] (Director on Call) for the Hospitalists listed on amion for assistance.  10/18/2018, 11:41 AM

## 2018-10-19 DIAGNOSIS — R112 Nausea with vomiting, unspecified: Secondary | ICD-10-CM

## 2018-10-19 DIAGNOSIS — E1121 Type 2 diabetes mellitus with diabetic nephropathy: Secondary | ICD-10-CM

## 2018-10-19 DIAGNOSIS — N179 Acute kidney failure, unspecified: Secondary | ICD-10-CM

## 2018-10-19 DIAGNOSIS — Z23 Encounter for immunization: Secondary | ICD-10-CM | POA: Diagnosis not present

## 2018-10-19 DIAGNOSIS — N183 Chronic kidney disease, stage 3 (moderate): Secondary | ICD-10-CM

## 2018-10-19 DIAGNOSIS — N052 Unspecified nephritic syndrome with diffuse membranous glomerulonephritis: Secondary | ICD-10-CM

## 2018-10-19 DIAGNOSIS — I10 Essential (primary) hypertension: Secondary | ICD-10-CM

## 2018-10-19 DIAGNOSIS — R197 Diarrhea, unspecified: Secondary | ICD-10-CM

## 2018-10-19 DIAGNOSIS — A045 Campylobacter enteritis: Principal | ICD-10-CM

## 2018-10-19 LAB — BASIC METABOLIC PANEL
Anion gap: 7 (ref 5–15)
BUN: 18 mg/dL (ref 8–23)
CO2: 20 mmol/L — ABNORMAL LOW (ref 22–32)
CREATININE: 1.85 mg/dL — AB (ref 0.44–1.00)
Calcium: 8.7 mg/dL — ABNORMAL LOW (ref 8.9–10.3)
Chloride: 117 mmol/L — ABNORMAL HIGH (ref 98–111)
GFR calc Af Amer: 32 mL/min — ABNORMAL LOW (ref >60)
GFR calc non Af Amer: 28 mL/min — ABNORMAL LOW (ref >60)
Glucose, Bld: 100 mg/dL — ABNORMAL HIGH (ref 70–99)
Potassium: 4.1 mmol/L (ref 3.5–5.1)
Sodium: 144 mmol/L (ref 135–145)

## 2018-10-19 LAB — CBC
HCT: 21.7 % — ABNORMAL LOW (ref 36.0–46.0)
HEMOGLOBIN: 7.5 g/dL — AB (ref 12.0–15.0)
MCH: 36.9 pg — ABNORMAL HIGH (ref 26.0–34.0)
MCHC: 34.6 g/dL (ref 30.0–36.0)
MCV: 106.9 fL — ABNORMAL HIGH (ref 80.0–100.0)
Platelets: 231 10*3/uL (ref 150–400)
RBC: 2.03 MIL/uL — ABNORMAL LOW (ref 3.87–5.11)
RDW: 13.7 % (ref 11.5–15.5)
WBC: 6.1 10*3/uL (ref 4.0–10.5)
nRBC: 0 % (ref 0.0–0.2)

## 2018-10-19 LAB — GLUCOSE, CAPILLARY: Glucose-Capillary: 75 mg/dL (ref 70–99)

## 2018-10-19 MED ORDER — INSULIN DETEMIR 100 UNIT/ML ~~LOC~~ SOLN: 10.0000 [IU] | Freq: Every day | SUBCUTANEOUS | Status: AC

## 2018-10-19 MED ORDER — ONDANSETRON 4 MG PO TBDP: 4.0000 mg | ORAL_TABLET | Freq: Three times a day (TID) | ORAL | 0 refills | Status: AC | PRN

## 2018-10-19 MED ORDER — PNEUMOCOCCAL VAC POLYVALENT 25 MCG/0.5ML IJ INJ
0.5000 mL | INJECTION | INTRAMUSCULAR | Status: AC
  Administered 2018-10-19: 0.5 mL via INTRAMUSCULAR
  Filled 2018-10-19: qty 0.5

## 2018-10-19 MED ORDER — FUROSEMIDE 40 MG PO TABS: 80.0000 mg | ORAL_TABLET | Freq: Every day | ORAL | Status: AC

## 2018-10-19 MED ORDER — TELMISARTAN 80 MG PO TABS: 160.0000 mg | ORAL_TABLET | Freq: Every day | ORAL | Status: DC

## 2018-10-19 MED ORDER — AZITHROMYCIN 500 MG PO TABS: 500.0000 mg | ORAL_TABLET | Freq: Every day | ORAL | 0 refills | Status: DC

## 2018-10-19 NOTE — Progress Notes (Signed)
Skin clean, dry and intact without evidence of skin break down, no evidence of skin tears noted.  Patient escorted via [place], and D/C home via private auto.  [place]  10/19/2018 11:33 AM

## 2018-10-19 NOTE — Discharge Summary (Addendum)
Physician Discharge Summary  [name] RJJ:[id number] DOB: 01/18/1951 DOA: 10/17/2018  PCP: Katherine Sites, MD  Admit date: 10/17/2018 Discharge date: 10/19/2018  Admitted From: Home Disposition:  Home  Recommendations for Outpatient Follow-up:  1. Follow up with PCP in 1 week with repeat CBC/BMP  2.  follow-up with nephrology/Dr. Deterding as an outpatient 3. Follow up in ED if symptoms worsen or new appear   Home Health: No Equipment/Devices: None  Discharge Condition: Stable CODE STATUS: Full Diet recommendation: Heart healthy/carb  Brief/Interim Summary: 68 year old female with history of anemia of chronic disease, CKD stage III, GERD, gout, membranous glomerulonephritis currently on cyclophosphamide, sleep apnea untreated and hypothyroidism presented with nausea and diarrhea.  She was found to have Campylobacter in stool.  She was started on azithromycin.  She was treated with IV fluids.  Her symptoms are improving.  She still having diarrhea but she is tolerating diet and is okay to be discharged.  Discharge Diagnoses:  Principal Problem:   Nausea vomiting and diarrhea Active Problems:   HTN (hypertension)   Membranous glomerulonephritis   Hypothyroidism   GERD (gastroesophageal reflux disease)   OSA (obstructive sleep apnea)   Hyperlipidemia   DM2 (diabetes mellitus, type 2) (HCC)   Hypokalemia   Acute renal failure superimposed on stage 3 chronic kidney disease (HCC)   Campylobacter enteritis  Campylobacter enteritis -Started on Zithromax.  Today will be day #3 of Zithromax.  No more antibiotics needed on discharge.  Diarrhea improving.  Acute kidney injury on chronic kidney disease stage III in a patient with history of membranous glomerulonephritis -Treated with IV fluids.  Creatinine improving.  Today is 1.85 -Spoke with Dr. Jimmy Burke on phone and he recommended that Lasix and telmisartan be held for a week and cyclophosphamide be held for at least 2 weeks.   Patient needs to follow-up with Dr. Jimmy Burke on discharge.  Outpatient follow-up of BMP with PCP within 5 to 7 days.  Hypokalemia -Replaced.  Resolved  GERD -Continue PPI  Diabetes mellitus type 2 -Blood sugars have been on the lower side while in the hospital.  Decrease Levemir to 10 units daily.  Outpatient follow-up with PCP  Hypertension -Resume Cardizem on discharge.  Lasix and telmisartan plan as above.  Hypothyroidism--continue Synthroid  Anemia of chronic disease -Hemoglobin stable.  Outpatient follow-up  Discharge Instructions  Discharge Instructions    Call MD for:  difficulty breathing, headache or visual disturbances   Complete by:  As directed    Call MD for:  extreme fatigue   Complete by:  As directed    Call MD for:  hives   Complete by:  As directed    Call MD for:  persistant dizziness or light-headedness   Complete by:  As directed    Call MD for:  persistant nausea and vomiting   Complete by:  As directed    Call MD for:  severe uncontrolled pain   Complete by:  As directed    Call MD for:  temperature >100.4   Complete by:  As directed    Diet - low sodium heart healthy   Complete by:  As directed    Diet Carb Modified   Complete by:  As directed    Increase activity slowly   Complete by:  As directed      Allergies as of 10/19/2018      Reactions   Codeine Nausea And Vomiting   Ibuprofen Other (See Comments)   Per kidney MD.   [place]ora Mask  Other (See Comments)   Per Kidney DR. No NSAIDS   Shellfish Allergy Other (See Comments)   REACTION: Gout Flare-ups    Septra Ds [sulfamethoxazole-trimethoprim] Itching, Rash      Medication List    STOP taking these medications   cyclophosphamide 50 MG capsule Commonly known as:  CYTOXAN   ondansetron 4 MG tablet Commonly known as:  ZOFRAN     TAKE these medications   allopurinol 300 MG tablet Commonly known as:  ZYLOPRIM Take 300 mg by mouth daily.   aspirin 81 MG tablet Take 81 mg by mouth  daily.   cetirizine 10 MG tablet Commonly known as:  ZYRTEC Take 10 mg by mouth at bedtime.   diltiazem 240 MG 24 hr capsule Commonly known as:  CARDIZEM CD Take 240 mg by mouth at bedtime.   fenofibrate 145 MG tablet Commonly known as:  TRICOR Take 145 mg by mouth at bedtime.   furosemide 40 MG tablet Commonly known as:  LASIX Take 2 tablets (80 mg total) by mouth daily. Start taking on:  October 26, 2018 What changed:  These instructions start on October 26, 2018. If you are unsure what to do until then, ask your doctor or other care provider.   insulin aspart 100 UNIT/ML injection Commonly known as:  novoLOG Inject 3-8 Units into the skin 3 (three) times daily before meals. On sliding scale   insulin detemir 100 UNIT/ML injection Commonly known as:  LEVEMIR Inject 0.1 mLs (10 Units total) into the skin daily. 25 Units AM; 10 units PM What changed:  how much to take   metoprolol succinate 100 MG 24 hr tablet Commonly known as:  TOPROL-XL TAKE 1 TABLET BY MOUTH EVERY DAY WITH FOOD What changed:  additional instructions   multivitamin with minerals Tabs tablet Take 1 tablet by mouth daily. Reported on 10/28/2015   NEXIUM PO Take 40 mg by mouth daily.   nitroGLYCERIN 0.4 MG SL tablet Commonly known as:  NITROSTAT PLACE 1 TABLET (0.4 MG TOTAL) UNDER THE TONGUE EVERY 5 (FIVE) MINUTES AS NEEDED FOR CHEST PAIN.   ondansetron 4 MG disintegrating tablet Commonly known as:  ZOFRAN ODT Take 1 tablet (4 mg total) by mouth every 8 (eight) hours as needed for nausea or vomiting.   rOPINIRole 0.5 MG tablet Commonly known as:  REQUIP Take 1 mg by mouth at bedtime.   rosuvastatin 40 MG tablet Commonly known as:  CRESTOR Take 40 mg by mouth at bedtime.   SYNTHROID 175 MCG tablet Generic drug:  levothyroxine Take 175 mcg by mouth daily.   telmisartan 80 MG tablet Commonly known as:  MICARDIS Take 2 tablets (160 mg total) by mouth at bedtime. Start taking on:  October 26, 2018 What  changed:  These instructions start on October 26, 2018. If you are unsure what to do until then, ask your doctor or other care provider.       Follow-up Information    Katherine Sites, MD. Schedule an appointment as soon as possible for a visit in 1 week(s).   Specialty:  Family Medicine Why:  with cbc/bmp Contact information: [address] [name] [address] [phone]        Mauricia Area, MD. Schedule an appointment as soon as possible for a visit in 1 week(s).   Specialty:  Nephrology Contact information: [address] [place] [address] [phone]          Allergies  Allergen Reactions  . Codeine Nausea And Vomiting  .  Ibuprofen Other (See Comments)    Per kidney MD.  . Nsaids Other (See Comments)    Per Kidney DR. No NSAIDS  . Shellfish Allergy Other (See Comments)    REACTION: Gout Flare-ups   . Septra Ds [Sulfamethoxazole-Trimethoprim] Itching and Rash    Consultations:  Spoke to Dr. Jimmy Burke on phone on 10/19/2018   Procedures/Studies: Ct Abdomen Pelvis Wo Contrast  Result Date: 10/15/2018 CLINICAL DATA:  Nausea vomiting for 4 days. Immunosuppressed patient. EXAM: CT ABDOMEN AND PELVIS WITHOUT CONTRAST TECHNIQUE: Multidetector CT imaging of the abdomen and pelvis was performed following the standard protocol without IV contrast. COMPARISON:  None. FINDINGS: Lower chest: Calcific atherosclerotic disease of the coronary arteries. Hepatobiliary: No focal liver abnormality is seen. Status post cholecystectomy. No biliary dilatation. Pancreas: Unremarkable. No pancreatic ductal dilatation or surrounding inflammatory changes. Spleen: Splenic granulomata. Adrenals/Urinary Tract: Adrenal glands are unremarkable. Kidneys are normal, without renal calculi, focal lesion, or hydronephrosis. Bladder is unremarkable. Stomach/Bowel: Decompressed stomach. No small bowel obstruction. Diffuse circumferential mucosal thickening of the cecum and sigmoid colon, with  less pronounced mucosal edema of the transverse colon. Diffuse diverticulosis of the sigmoid colon. Normal appendix. Vascular/Lymphatic: Aortic atherosclerosis. No enlarged abdominal or pelvic lymph nodes. Shotty mesenteric lymph nodes, most pronounced in the right lower quadrant. Reproductive: Status post hysterectomy. No adnexal masses. Other: No abdominal wall hernia or abnormality. No abdominopelvic ascites. Musculoskeletal: Spondylosis of the spine. IMPRESSION: 1. Diffuse circumferential mucosal thickening of the cecum and sigmoid colon, with less pronounced mucosal edema of the transverse colon. Findings are consistent with colitis, likely infectious or inflammatory. 2. Sigmoid diverticulosis, likely not the reason for colitis. 3. Shotty mesenteric lymph nodes, most pronounced in the right lower quadrant, likely reactive. 4. Calcific atherosclerotic disease of the coronary arteries and aorta. Electronically Signed   By: Fidela Salisbury M.D.   On: 10/15/2018 16:36   Mm 3d Screen Breast Bilateral  Result Date: 10/11/2018 CLINICAL DATA:  Screening. EXAM: DIGITAL SCREENING BILATERAL MAMMOGRAM WITH TOMO AND CAD COMPARISON:  Previous exam(s). ACR Breast Density Category a: The breast tissue is almost entirely fatty. FINDINGS: There are no findings suspicious for malignancy. Images were processed with CAD. IMPRESSION: No mammographic evidence of malignancy. A result letter of this screening mammogram will be mailed directly to the patient. RECOMMENDATION: Screening mammogram in one year. (Code:SM-B-01Y) BI-RADS CATEGORY  1: Negative. Electronically Signed   By: Claudie Revering M.D.   On: 10/11/2018 17:10       Subjective: Patient seen and examined at bedside.  States that her diarrhea is slowing down.  She is tolerating diet.  No nausea or vomiting.  Feels okay to go home.  No overnight fever.  Discharge Exam: Vitals:   10/18/18 2115 10/19/18 0530  BP: 129/61 (!) 142/81  Pulse: 75 69  Resp: 19 18   Temp: 98.9 F (37.2 C) 98.4 F (36.9 C)  SpO2: 98% 98%   Vitals:   10/18/18 1140 10/18/18 1814 10/18/18 2115 10/19/18 0530  BP: 118/64 132/66 129/61 (!) 142/81  Pulse: 82 93 75 69  Resp: 16 14 19 18   Temp: 98.1 F (36.7 C) 98.6 F (37 C) 98.9 F (37.2 C) 98.4 F (36.9 C)  TempSrc: Oral  Oral Oral  SpO2: 98%  98% 98%  Weight:      Height:        General: Pt is alert, awake, not in acute distress Cardiovascular: rate controlled, S1/S2 + Respiratory: bilateral decreased breath sounds at bases Abdominal: Soft, NT, ND, bowel  sounds + Extremities: no edema, no cyanosis    The results of significant diagnostics from this hospitalization (including imaging, microbiology, ancillary and laboratory) are listed below for reference.     Microbiology: Recent Results (from the past 240 hour(s))  Gastrointestinal Panel by PCR , Stool     Status: Abnormal   Collection Time: 10/17/18  7:50 AM  Result Value Ref Range Status   Campylobacter species DETECTED (A) NOT DETECTED Final    Comment: RESULT CALLED TO, READ BACK BY AND VERIFIED WITH: HEATHER POOL AT 1603 ON 10/17/2018 [place].    Plesimonas shigelloides NOT DETECTED NOT DETECTED Final   Salmonella species NOT DETECTED NOT DETECTED Final   Yersinia enterocolitica NOT DETECTED NOT DETECTED Final   Vibrio species NOT DETECTED NOT DETECTED Final   Vibrio cholerae NOT DETECTED NOT DETECTED Final   Enteroaggregative E coli (EAEC) NOT DETECTED NOT DETECTED Final   Enteropathogenic E coli (EPEC) NOT DETECTED NOT DETECTED Final   Enterotoxigenic E coli (ETEC) NOT DETECTED NOT DETECTED Final   Shiga like toxin producing E coli (STEC) NOT DETECTED NOT DETECTED Final   Shigella/Enteroinvasive E coli (EIEC) NOT DETECTED NOT DETECTED Final   Cryptosporidium NOT DETECTED NOT DETECTED Final   Cyclospora cayetanensis NOT DETECTED NOT DETECTED Final   Entamoeba histolytica NOT DETECTED NOT DETECTED Final   Giardia lamblia NOT DETECTED NOT  DETECTED Final   Adenovirus F40/41 NOT DETECTED NOT DETECTED Final   Astrovirus NOT DETECTED NOT DETECTED Final   Norovirus GI/GII NOT DETECTED NOT DETECTED Final   Rotavirus A NOT DETECTED NOT DETECTED Final   Sapovirus (I, II, IV, and V) NOT DETECTED NOT DETECTED Final    Comment: Performed at [facility], [address] [place], [name] [address]  C Difficile Quick Screen w PCR reflex     Status: None   Collection Time: 10/17/18  7:50 AM  Result Value Ref Range Status   C Diff antigen NEGATIVE NEGATIVE Final   C Diff toxin NEGATIVE NEGATIVE Final   C Diff interpretation No C. difficile detected.  Final    Comment: Performed at [facility], [place] [address], [place], [place] [address]     Labs: BNP (last 3 results) No results for input(s): BNP in the last 8760 hours. Basic Metabolic Panel: Recent Labs  Lab 10/15/18 1230 10/17/18 0750 10/18/18 0359 10/19/18 0513  NA 133* 132* 139 144  K 2.9* 2.8* 3.0* 4.1  CL 100 99 108 117*  CO2 21* 18* 20* 20*  GLUCOSE 182* 142* 90 100*  BUN 21 24* 20 18  CREATININE 2.13* 2.60* 2.07* 1.85*  CALCIUM 8.9 8.7* 8.7* 8.7*  MG  --   --  1.9  --   PHOS  --   --  3.0  --    Liver Function Tests: Recent Labs  Lab 10/15/18 1230 10/17/18 0750  AST 14* 13*  ALT 15 13  ALKPHOS 46 40  BILITOT 0.4 0.6  PROT 6.9 5.8*  ALBUMIN 3.2* 2.5*   Recent Labs  Lab 10/15/18 1230 10/17/18 0750  LIPASE 20 17   No results for input(s): AMMONIA in the last 168 hours. CBC: Recent Labs  Lab 10/15/18 1230 10/17/18 0750 10/18/18 0359 10/19/18 0513  WBC 4.1 7.1 5.8 6.1  NEUTROABS  --  4.7  --   --   HGB 9.5* 9.2* 7.6* 7.5*  HCT 28.2* 27.0* 22.0* 21.7*  MCV 107.2* 108.0* 105.8* 106.9*  PLT 215 203 203 231   Cardiac  Enzymes: No results for input(s): CKTOTAL, CKMB, CKMBINDEX, TROPONINI in the last 168 hours. BNP: Invalid input(s): POCBNP CBG: Recent Labs  Lab 10/18/18 0645 10/18/18 1113 10/18/18 1621 10/18/18 2154  10/19/18 0745  GLUCAP 91 113* 106* 145* 75   D-Dimer No results for input(s): DDIMER in the last 72 hours. Hgb A1c No results for input(s): HGBA1C in the last 72 hours. Lipid Profile No results for input(s): CHOL, HDL, LDLCALC, TRIG, CHOLHDL, LDLDIRECT in the last 72 hours. Thyroid function studies No results for input(s): TSH, T4TOTAL, T3FREE, THYROIDAB in the last 72 hours.  Invalid input(s): FREET3 Anemia work up No results for input(s): VITAMINB12, FOLATE, FERRITIN, TIBC, IRON, RETICCTPCT in the last 72 hours. Urinalysis    Component Value Date/Time   COLORURINE AMBER (A) 10/15/2018 1146   APPEARANCEUR CLOUDY (A) 10/15/2018 1146   LABSPEC 1.018 10/15/2018 1146   PHURINE 5.0 10/15/2018 1146   GLUCOSEU >=500 (A) 10/15/2018 1146   HGBUR SMALL (A) 10/15/2018 1146   BILIRUBINUR NEGATIVE 10/15/2018 1146   KETONESUR NEGATIVE 10/15/2018 1146   PROTEINUR >=300 (A) 10/15/2018 1146   NITRITE NEGATIVE 10/15/2018 1146   LEUKOCYTESUR NEGATIVE 10/15/2018 1146   Sepsis Labs Invalid input(s): PROCALCITONIN,  WBC,  LACTICIDVEN Microbiology Recent Results (from the past 240 hour(s))  Gastrointestinal Panel by PCR , Stool     Status: Abnormal   Collection Time: 10/17/18  7:50 AM  Result Value Ref Range Status   Campylobacter species DETECTED (A) NOT DETECTED Final    Comment: RESULT CALLED TO, READ BACK BY AND VERIFIED WITH: HEATHER POOL AT 1603 ON 10/17/2018 [place].    Plesimonas shigelloides NOT DETECTED NOT DETECTED Final   Salmonella species NOT DETECTED NOT DETECTED Final   Yersinia enterocolitica NOT DETECTED NOT DETECTED Final   Vibrio species NOT DETECTED NOT DETECTED Final   Vibrio cholerae NOT DETECTED NOT DETECTED Final   Enteroaggregative E coli (EAEC) NOT DETECTED NOT DETECTED Final   Enteropathogenic E coli (EPEC) NOT DETECTED NOT DETECTED Final   Enterotoxigenic E coli (ETEC) NOT DETECTED NOT DETECTED Final   Shiga like toxin producing E coli (STEC) NOT DETECTED NOT  DETECTED Final   Shigella/Enteroinvasive E coli (EIEC) NOT DETECTED NOT DETECTED Final   Cryptosporidium NOT DETECTED NOT DETECTED Final   Cyclospora cayetanensis NOT DETECTED NOT DETECTED Final   Entamoeba histolytica NOT DETECTED NOT DETECTED Final   Giardia lamblia NOT DETECTED NOT DETECTED Final   Adenovirus F40/41 NOT DETECTED NOT DETECTED Final   Astrovirus NOT DETECTED NOT DETECTED Final   Norovirus GI/GII NOT DETECTED NOT DETECTED Final   Rotavirus A NOT DETECTED NOT DETECTED Final   Sapovirus (I, II, IV, and V) NOT DETECTED NOT DETECTED Final    Comment: Performed at [facility], [address] [place], [name] [address]  C Difficile Quick Screen w PCR reflex     Status: None   Collection Time: 10/17/18  7:50 AM  Result Value Ref Range Status   C Diff antigen NEGATIVE NEGATIVE Final   C Diff toxin NEGATIVE NEGATIVE Final   C Diff interpretation No C. difficile detected.  Final    Comment: Performed at [facility], [place] [address], [place], [place] [address]     Time coordinating discharge: 35 minutes  SIGNED:   Aline August, MD  [facility] 10/19/2018, 9:04 AM

## 2018-10-19 NOTE — Progress Notes (Signed)
[name] discharged Home with husband per MD order.  Discharge instructions reviewed and discussed with the patient, all questions and concerns answered. Copy of instructions and care notes given to patient.  Allergies as of 10/19/2018      Reactions   Codeine Nausea And Vomiting   Ibuprofen Other (See Comments)   Per kidney MD.   Nsaids Other (See Comments)   Per Kidney DR. No NSAIDS   Shellfish Allergy Other (See Comments)   REACTION: Gout Flare-ups    Septra Ds [sulfamethoxazole-trimethoprim] Itching, Rash      Medication List    STOP taking these medications   cyclophosphamide 50 MG capsule Commonly known as:  CYTOXAN   ondansetron 4 MG tablet Commonly known as:  ZOFRAN     TAKE these medications   allopurinol 300 MG tablet Commonly known as:  ZYLOPRIM Take 300 mg by mouth daily.   aspirin 81 MG tablet Take 81 mg by mouth daily.   cetirizine 10 MG tablet Commonly known as:  ZYRTEC Take 10 mg by mouth at bedtime.   diltiazem 240 MG 24 hr capsule Commonly known as:  CARDIZEM CD Take 240 mg by mouth at bedtime.   fenofibrate 145 MG tablet Commonly known as:  TRICOR Take 145 mg by mouth at bedtime.   furosemide 40 MG tablet Commonly known as:  LASIX Take 2 tablets (80 mg total) by mouth daily. Start taking on:  October 26, 2018 What changed:  These instructions start on October 26, 2018. If you are unsure what to do until then, ask your doctor or other care provider.   insulin aspart 100 UNIT/ML injection Commonly known as:  novoLOG Inject 3-8 Units into the skin 3 (three) times daily before meals. On sliding scale   insulin detemir 100 UNIT/ML injection Commonly known as:  LEVEMIR Inject 0.1 mLs (10 Units total) into the skin daily. 25 Units AM; 10 units PM What changed:  how much to take   metoprolol succinate 100 MG 24 hr tablet Commonly known as:  TOPROL-XL TAKE 1 TABLET BY MOUTH EVERY DAY WITH FOOD What changed:  additional instructions   multivitamin  with minerals Tabs tablet Take 1 tablet by mouth daily. Reported on 10/28/2015   NEXIUM PO Take 40 mg by mouth daily.   nitroGLYCERIN 0.4 MG SL tablet Commonly known as:  NITROSTAT PLACE 1 TABLET (0.4 MG TOTAL) UNDER THE TONGUE EVERY 5 (FIVE) MINUTES AS NEEDED FOR CHEST PAIN.   ondansetron 4 MG disintegrating tablet Commonly known as:  ZOFRAN ODT Take 1 tablet (4 mg total) by mouth every 8 (eight) hours as needed for nausea or vomiting.   rOPINIRole 0.5 MG tablet Commonly known as:  REQUIP Take 1 mg by mouth at bedtime.   rosuvastatin 40 MG tablet Commonly known as:  CRESTOR Take 40 mg by mouth at bedtime.   SYNTHROID 175 MCG tablet Generic drug:  levothyroxine Take 175 mcg by mouth daily.   telmisartan 80 MG tablet Commonly known as:  MICARDIS Take 2 tablets (160 mg total) by mouth at bedtime. Start taking on:  October 26, 2018 What changed:  These instructions start on October 26, 2018. If you are unsure what to do until then, ask your doctor or other care provider.        IV site discontinued and catheter remains intact. Site without signs and symptoms of complications. Dressing and pressure applied.  Patient escorted to car by NT/volunteer in a wheelchair,  no distress noted upon  discharge.  [name], [name] 10/19/2018 10:31 AM

## 2018-10-19 NOTE — Plan of Care (Signed)

## 2018-10-24 DIAGNOSIS — G4733 Obstructive sleep apnea (adult) (pediatric): Secondary | ICD-10-CM | POA: Diagnosis not present

## 2018-10-24 DIAGNOSIS — Z1389 Encounter for screening for other disorder: Secondary | ICD-10-CM | POA: Diagnosis not present

## 2018-10-24 DIAGNOSIS — E6609 Other obesity due to excess calories: Secondary | ICD-10-CM | POA: Diagnosis not present

## 2018-10-24 DIAGNOSIS — Z6830 Body mass index (BMI) 30.0-30.9, adult: Secondary | ICD-10-CM | POA: Diagnosis not present

## 2018-10-24 DIAGNOSIS — N059 Unspecified nephritic syndrome with unspecified morphologic changes: Secondary | ICD-10-CM | POA: Diagnosis not present

## 2018-10-24 DIAGNOSIS — N184 Chronic kidney disease, stage 4 (severe): Secondary | ICD-10-CM | POA: Diagnosis not present

## 2018-10-24 DIAGNOSIS — D649 Anemia, unspecified: Secondary | ICD-10-CM | POA: Diagnosis not present

## 2018-11-07 DIAGNOSIS — E1165 Type 2 diabetes mellitus with hyperglycemia: Secondary | ICD-10-CM | POA: Diagnosis not present

## 2018-11-15 DIAGNOSIS — N183 Chronic kidney disease, stage 3 (moderate): Secondary | ICD-10-CM | POA: Diagnosis not present

## 2018-11-24 DIAGNOSIS — G4733 Obstructive sleep apnea (adult) (pediatric): Secondary | ICD-10-CM | POA: Diagnosis not present

## 2018-12-08 DIAGNOSIS — E1165 Type 2 diabetes mellitus with hyperglycemia: Secondary | ICD-10-CM | POA: Diagnosis not present

## 2018-12-24 DIAGNOSIS — G4733 Obstructive sleep apnea (adult) (pediatric): Secondary | ICD-10-CM | POA: Diagnosis not present

## 2019-01-02 DIAGNOSIS — E782 Mixed hyperlipidemia: Secondary | ICD-10-CM | POA: Diagnosis not present

## 2019-01-02 DIAGNOSIS — R809 Proteinuria, unspecified: Secondary | ICD-10-CM | POA: Diagnosis not present

## 2019-01-02 DIAGNOSIS — N052 Unspecified nephritic syndrome with diffuse membranous glomerulonephritis: Secondary | ICD-10-CM | POA: Diagnosis not present

## 2019-01-02 DIAGNOSIS — E039 Hypothyroidism, unspecified: Secondary | ICD-10-CM | POA: Diagnosis not present

## 2019-01-02 DIAGNOSIS — M1A30X0 Chronic gout due to renal impairment, unspecified site, without tophus (tophi): Secondary | ICD-10-CM | POA: Diagnosis not present

## 2019-01-02 DIAGNOSIS — D631 Anemia in chronic kidney disease: Secondary | ICD-10-CM | POA: Diagnosis not present

## 2019-01-02 DIAGNOSIS — I129 Hypertensive chronic kidney disease with stage 1 through stage 4 chronic kidney disease, or unspecified chronic kidney disease: Secondary | ICD-10-CM | POA: Diagnosis not present

## 2019-01-02 DIAGNOSIS — N2581 Secondary hyperparathyroidism of renal origin: Secondary | ICD-10-CM | POA: Diagnosis not present

## 2019-01-02 DIAGNOSIS — N183 Chronic kidney disease, stage 3 (moderate): Secondary | ICD-10-CM | POA: Diagnosis not present

## 2019-01-02 DIAGNOSIS — E669 Obesity, unspecified: Secondary | ICD-10-CM | POA: Diagnosis not present

## 2019-01-03 DIAGNOSIS — G4733 Obstructive sleep apnea (adult) (pediatric): Secondary | ICD-10-CM | POA: Diagnosis not present

## 2019-01-05 ENCOUNTER — Telehealth: Payer: Self-pay | Admitting: Cardiovascular Disease

## 2019-01-05 NOTE — Telephone Encounter (Signed)
New Message ° ° ° °Pt is returning call  ° ° ° °Please call back  °

## 2019-01-05 NOTE — Telephone Encounter (Signed)
Looks like Filomena Jungling called to reregister patient Message sent to her to call patient back

## 2019-01-08 ENCOUNTER — Telehealth (INDEPENDENT_AMBULATORY_CARE_PROVIDER_SITE_OTHER): Payer: Medicare HMO | Admitting: Cardiovascular Disease

## 2019-01-08 ENCOUNTER — Telehealth: Payer: Self-pay | Admitting: Cardiovascular Disease

## 2019-01-08 VITALS — BP 106/70 | HR 76 | Ht 61.0 in | Wt 150.0 lb

## 2019-01-08 DIAGNOSIS — E039 Hypothyroidism, unspecified: Secondary | ICD-10-CM | POA: Diagnosis not present

## 2019-01-08 DIAGNOSIS — R0609 Other forms of dyspnea: Secondary | ICD-10-CM | POA: Diagnosis not present

## 2019-01-08 DIAGNOSIS — E785 Hyperlipidemia, unspecified: Secondary | ICD-10-CM

## 2019-01-08 DIAGNOSIS — I10 Essential (primary) hypertension: Secondary | ICD-10-CM | POA: Diagnosis not present

## 2019-01-08 DIAGNOSIS — N052 Unspecified nephritic syndrome with diffuse membranous glomerulonephritis: Secondary | ICD-10-CM | POA: Diagnosis not present

## 2019-01-08 DIAGNOSIS — E118 Type 2 diabetes mellitus with unspecified complications: Secondary | ICD-10-CM | POA: Diagnosis not present

## 2019-01-08 DIAGNOSIS — G4733 Obstructive sleep apnea (adult) (pediatric): Secondary | ICD-10-CM | POA: Diagnosis not present

## 2019-01-08 DIAGNOSIS — E1165 Type 2 diabetes mellitus with hyperglycemia: Secondary | ICD-10-CM | POA: Diagnosis not present

## 2019-01-08 DIAGNOSIS — R0600 Dyspnea, unspecified: Secondary | ICD-10-CM

## 2019-01-08 NOTE — Patient Instructions (Signed)
Medication Instructions:  The current medical regimen is effective;  continue present plan and medications.  If you need a refill on your cardiac medications before your next appointment, please call your pharmacy.   Testing/Procedures: Echocardiogram (before 6 month appointment) - Your physician has requested that you have an echocardiogram. Echocardiography is a painless test that uses sound waves to create images of your heart. It provides your doctor with information about the size and shape of your heart and how well your heart's chambers and valves are working. This procedure takes approximately one hour. There are no restrictions for this procedure. This will be performed at our [facility] location - [address], [address].   Follow-Up: At [facility], you and your health needs are our priority.  As part of our continuing mission to provide you with exceptional heart care, we have created designated Provider Care Teams.  These Care Teams include your primary Cardiologist (physician) and Advanced Practice Providers (APPs -  Physician Assistants and Nurse Practitioners) who all work together to provide you with the care you need, when you need it. You will need a follow up appointment in 6 months.  Please call our office 2 months in advance to schedule this appointment.  You may see Dr.Kelly or one of the following Advanced Practice Providers on your designated Care Team: Almyra Deforest, [place] . Fabian Sharp, PA-C

## 2019-01-08 NOTE — Telephone Encounter (Signed)
MyChart message sent to patient.

## 2019-01-08 NOTE — Progress Notes (Signed)
Virtual Visit via Video Note   This visit type was conducted due to national recommendations for restrictions regarding the COVID-19 Pandemic (e.g. social distancing) in an effort to limit this patient's exposure and mitigate transmission in our community.  Due to her co-morbid illnesses, this patient is at least at moderate risk for complications without adequate follow up.  This format is felt to be most appropriate for this patient at this time.  All issues noted in this document were discussed and addressed.  A limited physical exam was performed with this format.  Please refer to the patient's chart for her consent to telehealth for [facility].   Date:  01/08/2019   ID:  [name], DOB October 26, 1950, MRN [id number]  Patient Location: Home Provider Location: Home  PCP:  Sharilyn Sites, MD  Cardiologist:  Shelva Majestic, MD Electrophysiologist:  None   Evaluation Performed:  Follow-Up Visit  Chief Complaint:  7 month F/U  History of Present Illness:    [name] is a 68 y.o. 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 Jimmy Footman. She also has a history of hypertension, hypothyroidism,  DM2, GERD as well as obstructive sleep apnea and had only  intermittently utilized CPAP therapy. Her OSA is moderate with an AHI of 20.9 but severe during REM sleep with an AHI of 54.3 which was noted on her initial sleep study in 2009.  Marland Kitchen She also has a history of hyperlipidemia and has been on combination therapy with fenofibrate and Crestor.  She underwent another kidney biopsy in October 2014.  This did not show any significant pathologic change.  She has been on direct renin inhibition and ARB therapy  in attempt to reduce protein excretion.  She tells recently, her proteinuria has increased to 10 grams. She has a history of hypothyroidism and is on thyroid replacement.   She has a history of hyperlipidemia and has been on combination Crestor and fenofibrate.  In addition to her tekturna and micardis she also has been taking Lasix 40 mg daily, Toprol-XL 50 mg and Cardizem 240 mg for additional blood pressure control.  She is diabetic on Levemir insulin.  She no longer is taking Tradjenta.  She now sees Dr. Suzette Battiest for her endocrinologic care.  She denies chest pain brought on with exertion.  She admits to ankle swelling.  He is followed by Dr. Reinaldo Meeker dating for renal disease.  Her creatinine had risen up to 2.4 but most recently this had improved to 2.0.  She was started on a trial of cyclosporine, but she did not tolerate this.  She had experienced some rare to occasional palpitations.  This seemed to occur when she was on levothyroxine at 225 g.  Her symptoms improved with dose reduction down to 200 g.  Her Synthroid dose was further reduced to 175 g.  In August 2018, TSH was 0.08.  She is by Dr. Jimmy Footman for her nephrotic range proteinuria.  When I last saw her in November 2018   protein to creatinine ratio was 20,927.  Since I last saw her, she ultimately was started on Cytoxan in January 2019 and also was on prednisone.  She developed leg swelling on prednisone treated with Lasix.  Recently due to anemia her Cytoxan dose  was reduced from 150 mg down to 75 mg.  She recently saw Dr. Jimmy Footman on Jan 17, 2018.  Hematocrit was 27.9.  Glucose was increased.  BUN 14 creatinine 1.23.   I last saw [name] in June 2019.  I had reviewed her recent evaluation from nephrology.  When I saw her she informed me that she had not used CPAP in over 2 years.  She was snoring loudly and was waking up at least 3-4 times per night for urination.  I had a long discussion with her regarding adverse consequences of  untreated sleep apnea particularly on her cardiovascular health.  She ultimately agreed to undergo a sleep study and reassess her sleep apnea.  The sleep study was done on February 12, 2018 and demonstrated severe sleep apnea with an AHI of 44.1 and RDI 52.6/h.  She had severe oxygen desaturation to a nadir of 73%.  There was soft snoring.  She was titrated on CPAP and 11 cm water pressure was recommended.  Of note, she did have bruxism.  Her CPAP set up date was February 22, 2018 with [facility] as her DME company.  A download from April 24, 2018 through May 23, 2018 shows that she is meeting compliance with 90% of usage days and usage greater than 4 hours at 80%.  She was averaging 6 hours and 14 minutes of CPAP use.  AHI is 4.0.  On several nights she did have moderate mask leak.  She has noticed that she has felt well with therapy.  However, upon further questioning she often goes to bed around 10 PM and may wake up at 8 AM.  I have recommended CPAP for the entire duration of sleep particularly since the preponderance of REM sleep occurs in the second half of the night when her sleep apnea is worse.  Presently, she is without chest pain.  She saw Dr. Jimmy Footman in August 2019 and BUN was 22 creatinine 1.33.  She was anemic with a hemoglobin of 9 hematocrit 26.8.   I last saw her in October 2019.  Since that time, she has made a major effort to improve her CPAP duration.  She has continued to be followed by Dr. Michiel Sites for her diabetes and Dr. Jimmy Footman for her renal disease.  She was hospitalized in late February 2020 after developing nausea vomiting and diarrhea and was found to have Campylobacter enteritis.  Since that time she had been taken off cyclophosphamide in addition to telmisartan.  She is generating the undergo repeat laboratory to reassess her renal function.  She states her blood pressure off telmisartan has been running around 130/78 but there have been several instances where her blood  pressure had gotten around 416 systolic and she had been advised by her primary to hold metoprolol if that happened.  She noted on the days she did hold the metoprolol that her pulse would increase particularly by the end of the night.  She continues to take the furosemide at the 80 mg dose and usually by the end of the day notes some return of some mild leg swelling.  She denies any episode of chest pain PND orthopnea.  She denies any definitive shortness of breath as long as she is not very active.  However, to get her mail she has to walk up a hill and she does note some shortness of breath with a walking.  Her sleeping is significantly improved and she admits to markedly improved CPAP compliance. I  was able to obtain a download from her CPAP unit from August 18 through Jan 07, 2019.  There were 3 days where she did not use therapy due to residual nausea.  However she is meeting compliance standards with 90% of usage.  Her average usage is 7 hours and 4 minutes.  At her 11 cm water pressure, AHI is excellent at 1.8.  She typically could may go to the bathroom 1-2 times per night.  She is unaware of any breakthrough snoring.  Her sleep is restorative.  She denies daytime sleepiness.  She presents for evaluation.   The patient does not have symptoms concerning for COVID-19 infection (fever, chills, cough, or new shortness of breath).    Past Medical History:  Diagnosis Date   Anemia    "as a kid" (06/18/2013)   Anginal pain ([place])    "often; usually when thyroid goes out of wack and heart starts fluttering" (06/18/2013)   Arthritis    "joints probably" (101/27/2014)   Carotid stenosis ~ 01/2013   "bilaterally; ~ 50%; dx'd/carotid US" (06/18/2013)   Dysrhythmia    "palpitation-type feelings; related to thyroid" (06/18/2013)   Exertional shortness of breath    GERD (gastroesophageal reflux disease)    Gout    Headache(784.0)    "monthly" (06/18/2013)   Heart murmur    Hepatitis,  unspecified 1972   "husband had hepatitis; my mother, daughter, and me all had to get the gammaglobulin shots" (06/18/2013)   High cholesterol    Hypertension    Hypothyroidism    Membranous glomerulonephritis    Pneumonia ?1980's; 09/2011   "twice" (06/18/2013)   Sleep apnea    "have mask; haven't worn it in ~ 1 yr" (06/18/2013)   Type II diabetes mellitus [facility])    Past Surgical History:  Procedure Laterality Date   BUNIONECTOMY Bilateral 1994-1997   "once on each side" (06/18/2013)   [place]; [place] 12/03/2015   Procedure: COLONOSCOPY;  Surgeon: Daneil Dolin, MD;  Location: [facility];  Service: Endoscopy;  Laterality: N/A;  8:30 AM   RENAL BIOPSY Left ~ 1997; 06/18/2013   SHOULDER ARTHROSCOPY W/ ROTATOR CUFF REPAIR Right 2009   TONSILLECTOMY       Current Meds  Medication Sig   allopurinol (ZYLOPRIM) 300 MG tablet Take 300 mg by mouth daily.   aspirin 81 MG tablet Take 81 mg by mouth daily.   cetirizine (ZYRTEC) 10 MG tablet Take 10 mg by mouth at bedtime.   diltiazem (CARDIZEM CD) 240 MG 24 hr capsule Take 240 mg by mouth at bedtime.   Esomeprazole Magnesium (NEXIUM PO) Take 40 mg by mouth daily.    fenofibrate (TRICOR) 145 MG tablet Take 145 mg by mouth at bedtime.    furosemide (LASIX) 40 MG tablet Take 2 tablets (80 mg total) by mouth daily.   insulin aspart (NOVOLOG) 100 UNIT/ML injection Inject 3-11 Units into the skin 3 (three) times daily before meals. On sliding scale   insulin detemir (LEVEMIR) 100 UNIT/ML injection Inject 0.1 mLs (10 Units total) into the skin daily. 25 Units AM; 10 units PM (Patient taking differently: Inject 10-25 Units into the skin daily. 25 Units AM; 10 units PM)   metoprolol succinate (TOPROL-XL) 100 MG 24 hr tablet TAKE 1 TABLET BY MOUTH EVERY DAY WITH FOOD (Patient taking differently: Take 100 mg by mouth daily. )   Multiple Vitamin (MULTIVITAMIN WITH  MINERALS) TABS tablet Take  1 tablet by mouth daily. Reported on 10/28/2015   ondansetron (ZOFRAN ODT) 4 MG disintegrating tablet Take 1 tablet (4 mg total) by mouth every 8 (eight) hours as needed for nausea or vomiting.   rOPINIRole (REQUIP) 0.5 MG tablet Take 1 mg by mouth at bedtime.    rosuvastatin (CRESTOR) 40 MG tablet Take 40 mg by mouth at bedtime.   SYNTHROID 175 MCG tablet Take 175 mcg by mouth daily.    [DISCONTINUED] telmisartan (MICARDIS) 80 MG tablet Take 2 tablets (160 mg total) by mouth at bedtime.     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  Family Hx: The patient's family history includes Cirrhosis (age of onset: 47) in her mother; Coronary artery disease in her father.  ROS:   Please see the history of present illness.    She denies any night sweats, cough, change in smell or taste She admits to mild shortness of breath with uphill walking. No chest pain, PND orthopnea Trace edema lower extremities typically towards the latter portion of the day History of recent Campylobacter enteritis resulting in nausea vomiting and diarrhea.  Treated with azithromycin. Membranous glomerulonephritis followed by Dr.Deterding History of TMJ Positive for diabetes Improved compliance with CPAP therapy no residual daytime sleepiness, no breakthrough snoring. All other systems reviewed and are negative.   Prior CV studies:   The following studies were reviewed today:  I reviewed the patient's most recent hospitalization at [facility] from February 25 through October 19, 2018  Labs/Other Tests and Data Reviewed:    EKG:  An ECG dated 06/19/2018 was  personally reviewed today and demonstrated:  Normal sinus rhythm at 68 bpm  Recent Labs: 10/17/2018: ALT 13 10/18/2018: Magnesium 1.9 10/19/2018: BUN 18; Creatinine, Ser 1.85; Hemoglobin 7.5; Platelets 231; Potassium 4.1; Sodium 144   Recent Lipid Panel No results found for: CHOL, TRIG, HDL, CHOLHDL, LDLCALC, LDLDIRECT  Wt Readings from Last 3 Encounters:  01/08/19 150 lb (68 kg)  10/17/18 154 lb 1.6 oz (69.9 kg)  10/15/18 154 lb (69.9 kg)     Objective:    Vital Signs:  BP 106/70    Pulse 76    Ht 5\' 1"  (1.549 m)    Wt 150 lb (68 kg)    BMI 28.34 kg/m    She is well-developed and well-nourished in no acute distress There did not appear to be any JVD Her breathing was normal and nonlabored There was no audible wheezing Her heart rhythm was stable by her palpation She denied any soreness to her chest or abdomen She denied any muscle tenderness Although there was no swelling presently she states by the end of the day she will have some swelling around her ankles Neurologically she appeared grossly intact Had a normal affect, mood and cognition  ASSESSMENT & PLAN:    1. Obstructive sleep apnea: I again reviewed her diagnostic polysomnogram data. On her initial study in 2009 she had moderate overall sleep apnea but severe sleep apnea during REM sleep. On her subsequent evaluation in June 2019, sleep apnea was severe with an AHI of 44.1 and and REM sleep 74.4 with significant oxygen desaturation to a nadir  of 73%. with significant oxygen desaturation to a nadir of 73% without therapy. I commended her on her significant improve CPAP usage since her last evaluation.  I was able to obtain a download from April 18 through Jan 07, 2019.  She is meeting compliance standards and averaging 7 hours and 4 minutes of CPAP use per night.  At 11 cm water pressure, AHI is excellent at 1.8.  There is no significant mask leak.  She is unaware of breakthrough snoring.  She denies excessive daytime  sleepiness.  Her sleep is now restorative.  She typically has nocturia 2 times per night. 2. Essential hypertension: telmisartan and cyclophosphamide have been on hold since her recent Campylobacter enteritis including he continues to be on furosemide 80 mg, diltiazem 240 mg, Toprol-XL 100 mg.  She will be undergoing repeat laboratory next week per Dr.Deterding. 3. Mild exertional dyspnea: She denies any chest tightness or pressure.  Her last Echo Doppler study was in 2013 at which time she had normal LV function.  I have recommended prior to her next office visit in 6 months that she undergo a repeat echo Doppler study to reassess systolic and diastolic function, and valvular architecture. 4. Hyperlipidemia with target less than 70: She continues to be on rosuvastatin 40 mg and fenofibrate 145 mg. I will see if she has had recent lipid studies from her primary physician.  If not done recently, these should be obtained. 5. Hypothyroidism: She continues to be on levothyroxine 175 mcg 6. Insulin-dependent diabetes mellitus: Followed by Dr. Bubba Camp.  COVID-19 Education: The signs and symptoms of COVID-19 were discussed with the patient and how to seek care for testing (follow up with PCP or arrange E-visit).  *The importance of social distancing was discussed today.  Time:   Today, I have spent 21 minutes with the patient with telehealth technology discussing the above problems.     Medication Adjustments/Labs and Tests Ordered: Current medicines are reviewed at length with the patient today.  Concerns regarding medicines are outlined above.   Tests Ordered: No orders of the defined types were placed in this encounter.   Medication Changes: No orders of the defined types were placed in this encounter.   Disposition:  Follow up echo and ov in 6 months  Signed, Shelva Majestic, MD  01/08/2019 9:21 AM    [place]

## 2019-01-11 DIAGNOSIS — N052 Unspecified nephritic syndrome with diffuse membranous glomerulonephritis: Secondary | ICD-10-CM | POA: Diagnosis not present

## 2019-01-11 DIAGNOSIS — N183 Chronic kidney disease, stage 3 (moderate): Secondary | ICD-10-CM | POA: Diagnosis not present

## 2019-01-23 DIAGNOSIS — J309 Allergic rhinitis, unspecified: Secondary | ICD-10-CM | POA: Diagnosis not present

## 2019-01-23 DIAGNOSIS — I499 Cardiac arrhythmia, unspecified: Secondary | ICD-10-CM | POA: Diagnosis not present

## 2019-01-23 DIAGNOSIS — E1165 Type 2 diabetes mellitus with hyperglycemia: Secondary | ICD-10-CM | POA: Diagnosis not present

## 2019-01-23 DIAGNOSIS — K219 Gastro-esophageal reflux disease without esophagitis: Secondary | ICD-10-CM | POA: Diagnosis not present

## 2019-01-23 DIAGNOSIS — E785 Hyperlipidemia, unspecified: Secondary | ICD-10-CM | POA: Diagnosis not present

## 2019-01-23 DIAGNOSIS — E1122 Type 2 diabetes mellitus with diabetic chronic kidney disease: Secondary | ICD-10-CM | POA: Diagnosis not present

## 2019-01-23 DIAGNOSIS — M109 Gout, unspecified: Secondary | ICD-10-CM | POA: Diagnosis not present

## 2019-01-23 DIAGNOSIS — Z794 Long term (current) use of insulin: Secondary | ICD-10-CM | POA: Diagnosis not present

## 2019-01-23 DIAGNOSIS — G2581 Restless legs syndrome: Secondary | ICD-10-CM | POA: Diagnosis not present

## 2019-01-23 DIAGNOSIS — E039 Hypothyroidism, unspecified: Secondary | ICD-10-CM | POA: Diagnosis not present

## 2019-01-26 DIAGNOSIS — E119 Type 2 diabetes mellitus without complications: Secondary | ICD-10-CM | POA: Diagnosis not present

## 2019-01-26 DIAGNOSIS — E7800 Pure hypercholesterolemia, unspecified: Secondary | ICD-10-CM | POA: Diagnosis not present

## 2019-01-26 DIAGNOSIS — E039 Hypothyroidism, unspecified: Secondary | ICD-10-CM | POA: Diagnosis not present

## 2019-01-26 DIAGNOSIS — E7849 Other hyperlipidemia: Secondary | ICD-10-CM | POA: Diagnosis not present

## 2019-01-26 DIAGNOSIS — E1165 Type 2 diabetes mellitus with hyperglycemia: Secondary | ICD-10-CM | POA: Diagnosis not present

## 2019-02-01 DIAGNOSIS — N183 Chronic kidney disease, stage 3 (moderate): Secondary | ICD-10-CM | POA: Diagnosis not present

## 2019-02-02 DIAGNOSIS — E039 Hypothyroidism, unspecified: Secondary | ICD-10-CM | POA: Diagnosis not present

## 2019-02-02 DIAGNOSIS — I10 Essential (primary) hypertension: Secondary | ICD-10-CM | POA: Diagnosis not present

## 2019-02-02 DIAGNOSIS — E7800 Pure hypercholesterolemia, unspecified: Secondary | ICD-10-CM | POA: Diagnosis not present

## 2019-02-02 DIAGNOSIS — E1165 Type 2 diabetes mellitus with hyperglycemia: Secondary | ICD-10-CM | POA: Diagnosis not present

## 2019-02-02 DIAGNOSIS — N183 Chronic kidney disease, stage 3 (moderate): Secondary | ICD-10-CM | POA: Diagnosis not present

## 2019-02-08 DIAGNOSIS — E1165 Type 2 diabetes mellitus with hyperglycemia: Secondary | ICD-10-CM | POA: Diagnosis not present

## 2019-03-07 DIAGNOSIS — N2581 Secondary hyperparathyroidism of renal origin: Secondary | ICD-10-CM | POA: Diagnosis not present

## 2019-03-07 DIAGNOSIS — M1A30X0 Chronic gout due to renal impairment, unspecified site, without tophus (tophi): Secondary | ICD-10-CM | POA: Diagnosis not present

## 2019-03-07 DIAGNOSIS — R809 Proteinuria, unspecified: Secondary | ICD-10-CM | POA: Diagnosis not present

## 2019-03-07 DIAGNOSIS — N052 Unspecified nephritic syndrome with diffuse membranous glomerulonephritis: Secondary | ICD-10-CM | POA: Diagnosis not present

## 2019-03-07 DIAGNOSIS — E669 Obesity, unspecified: Secondary | ICD-10-CM | POA: Diagnosis not present

## 2019-03-07 DIAGNOSIS — D631 Anemia in chronic kidney disease: Secondary | ICD-10-CM | POA: Diagnosis not present

## 2019-03-07 DIAGNOSIS — I129 Hypertensive chronic kidney disease with stage 1 through stage 4 chronic kidney disease, or unspecified chronic kidney disease: Secondary | ICD-10-CM | POA: Diagnosis not present

## 2019-03-07 DIAGNOSIS — E118 Type 2 diabetes mellitus with unspecified complications: Secondary | ICD-10-CM | POA: Diagnosis not present

## 2019-03-07 DIAGNOSIS — N183 Chronic kidney disease, stage 3 (moderate): Secondary | ICD-10-CM | POA: Diagnosis not present

## 2019-03-07 DIAGNOSIS — Z1159 Encounter for screening for other viral diseases: Secondary | ICD-10-CM | POA: Diagnosis not present

## 2019-03-07 DIAGNOSIS — N189 Chronic kidney disease, unspecified: Secondary | ICD-10-CM | POA: Diagnosis not present

## 2019-03-07 DIAGNOSIS — E039 Hypothyroidism, unspecified: Secondary | ICD-10-CM | POA: Diagnosis not present

## 2019-03-19 DIAGNOSIS — E1165 Type 2 diabetes mellitus with hyperglycemia: Secondary | ICD-10-CM | POA: Diagnosis not present

## 2019-03-23 DIAGNOSIS — N183 Chronic kidney disease, stage 3 (moderate): Secondary | ICD-10-CM | POA: Diagnosis not present

## 2019-03-26 DIAGNOSIS — E6609 Other obesity due to excess calories: Secondary | ICD-10-CM | POA: Diagnosis not present

## 2019-03-26 DIAGNOSIS — G4730 Sleep apnea, unspecified: Secondary | ICD-10-CM | POA: Diagnosis not present

## 2019-03-26 DIAGNOSIS — Z1389 Encounter for screening for other disorder: Secondary | ICD-10-CM | POA: Diagnosis not present

## 2019-03-26 DIAGNOSIS — Z0001 Encounter for general adult medical examination with abnormal findings: Secondary | ICD-10-CM | POA: Diagnosis not present

## 2019-03-26 DIAGNOSIS — E1129 Type 2 diabetes mellitus with other diabetic kidney complication: Secondary | ICD-10-CM | POA: Diagnosis not present

## 2019-03-26 DIAGNOSIS — E039 Hypothyroidism, unspecified: Secondary | ICD-10-CM | POA: Diagnosis not present

## 2019-03-26 DIAGNOSIS — N059 Unspecified nephritic syndrome with unspecified morphologic changes: Secondary | ICD-10-CM | POA: Diagnosis not present

## 2019-03-26 DIAGNOSIS — Z6830 Body mass index (BMI) 30.0-30.9, adult: Secondary | ICD-10-CM | POA: Diagnosis not present

## 2019-03-26 DIAGNOSIS — I10 Essential (primary) hypertension: Secondary | ICD-10-CM | POA: Diagnosis not present

## 2019-03-26 DIAGNOSIS — N184 Chronic kidney disease, stage 4 (severe): Secondary | ICD-10-CM | POA: Diagnosis not present

## 2019-04-18 DIAGNOSIS — G4733 Obstructive sleep apnea (adult) (pediatric): Secondary | ICD-10-CM | POA: Diagnosis not present

## 2019-04-18 DIAGNOSIS — E1165 Type 2 diabetes mellitus with hyperglycemia: Secondary | ICD-10-CM | POA: Diagnosis not present

## 2019-05-19 DIAGNOSIS — G4733 Obstructive sleep apnea (adult) (pediatric): Secondary | ICD-10-CM | POA: Diagnosis not present

## 2019-05-21 DIAGNOSIS — E1165 Type 2 diabetes mellitus with hyperglycemia: Secondary | ICD-10-CM | POA: Diagnosis not present

## 2019-05-30 DIAGNOSIS — R809 Proteinuria, unspecified: Secondary | ICD-10-CM | POA: Diagnosis not present

## 2019-05-30 DIAGNOSIS — N189 Chronic kidney disease, unspecified: Secondary | ICD-10-CM | POA: Diagnosis not present

## 2019-05-30 DIAGNOSIS — E782 Mixed hyperlipidemia: Secondary | ICD-10-CM | POA: Diagnosis not present

## 2019-05-30 DIAGNOSIS — E118 Type 2 diabetes mellitus with unspecified complications: Secondary | ICD-10-CM | POA: Diagnosis not present

## 2019-05-30 DIAGNOSIS — E039 Hypothyroidism, unspecified: Secondary | ICD-10-CM | POA: Diagnosis not present

## 2019-05-30 DIAGNOSIS — N183 Chronic kidney disease, stage 3 unspecified: Secondary | ICD-10-CM | POA: Diagnosis not present

## 2019-05-30 DIAGNOSIS — N2581 Secondary hyperparathyroidism of renal origin: Secondary | ICD-10-CM | POA: Diagnosis not present

## 2019-05-30 DIAGNOSIS — D631 Anemia in chronic kidney disease: Secondary | ICD-10-CM | POA: Diagnosis not present

## 2019-05-30 DIAGNOSIS — N052 Unspecified nephritic syndrome with diffuse membranous glomerulonephritis: Secondary | ICD-10-CM | POA: Diagnosis not present

## 2019-05-30 DIAGNOSIS — I129 Hypertensive chronic kidney disease with stage 1 through stage 4 chronic kidney disease, or unspecified chronic kidney disease: Secondary | ICD-10-CM | POA: Diagnosis not present

## 2019-05-30 DIAGNOSIS — M1A30X0 Chronic gout due to renal impairment, unspecified site, without tophus (tophi): Secondary | ICD-10-CM | POA: Diagnosis not present

## 2019-06-01 DIAGNOSIS — Z23 Encounter for immunization: Secondary | ICD-10-CM | POA: Diagnosis not present

## 2019-06-08 DIAGNOSIS — E1165 Type 2 diabetes mellitus with hyperglycemia: Secondary | ICD-10-CM | POA: Diagnosis not present

## 2019-06-08 DIAGNOSIS — E7800 Pure hypercholesterolemia, unspecified: Secondary | ICD-10-CM | POA: Diagnosis not present

## 2019-06-08 DIAGNOSIS — E039 Hypothyroidism, unspecified: Secondary | ICD-10-CM | POA: Diagnosis not present

## 2019-06-08 DIAGNOSIS — N183 Chronic kidney disease, stage 3 unspecified: Secondary | ICD-10-CM | POA: Diagnosis not present

## 2019-06-08 DIAGNOSIS — I10 Essential (primary) hypertension: Secondary | ICD-10-CM | POA: Diagnosis not present

## 2019-06-11 DIAGNOSIS — N189 Chronic kidney disease, unspecified: Secondary | ICD-10-CM | POA: Diagnosis not present

## 2019-06-18 DIAGNOSIS — G4733 Obstructive sleep apnea (adult) (pediatric): Secondary | ICD-10-CM | POA: Diagnosis not present

## 2019-06-20 DIAGNOSIS — E1165 Type 2 diabetes mellitus with hyperglycemia: Secondary | ICD-10-CM | POA: Diagnosis not present

## 2019-06-22 DIAGNOSIS — E1122 Type 2 diabetes mellitus with diabetic chronic kidney disease: Secondary | ICD-10-CM | POA: Diagnosis not present

## 2019-06-22 DIAGNOSIS — Z79899 Other long term (current) drug therapy: Secondary | ICD-10-CM | POA: Diagnosis not present

## 2019-06-22 DIAGNOSIS — Z794 Long term (current) use of insulin: Secondary | ICD-10-CM | POA: Diagnosis not present

## 2019-06-22 DIAGNOSIS — I129 Hypertensive chronic kidney disease with stage 1 through stage 4 chronic kidney disease, or unspecified chronic kidney disease: Secondary | ICD-10-CM | POA: Diagnosis not present

## 2019-06-28 DIAGNOSIS — G4733 Obstructive sleep apnea (adult) (pediatric): Secondary | ICD-10-CM | POA: Diagnosis not present

## 2019-07-03 DIAGNOSIS — R69 Illness, unspecified: Secondary | ICD-10-CM | POA: Diagnosis not present

## 2019-07-11 ENCOUNTER — Ambulatory Visit (HOSPITAL_COMMUNITY): Payer: Medicare HMO | Attending: Cardiovascular Disease

## 2019-07-11 ENCOUNTER — Other Ambulatory Visit: Payer: Self-pay

## 2019-07-11 DIAGNOSIS — G4733 Obstructive sleep apnea (adult) (pediatric): Secondary | ICD-10-CM | POA: Diagnosis not present

## 2019-07-11 DIAGNOSIS — I10 Essential (primary) hypertension: Secondary | ICD-10-CM | POA: Insufficient documentation

## 2019-07-16 DIAGNOSIS — I10 Essential (primary) hypertension: Secondary | ICD-10-CM | POA: Diagnosis not present

## 2019-07-16 DIAGNOSIS — Z794 Long term (current) use of insulin: Secondary | ICD-10-CM | POA: Diagnosis not present

## 2019-07-16 DIAGNOSIS — E1165 Type 2 diabetes mellitus with hyperglycemia: Secondary | ICD-10-CM | POA: Diagnosis not present

## 2019-07-16 DIAGNOSIS — E7800 Pure hypercholesterolemia, unspecified: Secondary | ICD-10-CM | POA: Diagnosis not present

## 2019-07-16 DIAGNOSIS — E039 Hypothyroidism, unspecified: Secondary | ICD-10-CM | POA: Diagnosis not present

## 2019-07-16 DIAGNOSIS — N183 Chronic kidney disease, stage 3 unspecified: Secondary | ICD-10-CM | POA: Diagnosis not present

## 2019-07-21 DIAGNOSIS — E1165 Type 2 diabetes mellitus with hyperglycemia: Secondary | ICD-10-CM | POA: Diagnosis not present

## 2019-07-25 ENCOUNTER — Ambulatory Visit: Payer: Medicare HMO | Admitting: Cardiovascular Disease

## 2019-07-25 ENCOUNTER — Encounter: Payer: Self-pay | Admitting: Cardiovascular Disease

## 2019-07-25 ENCOUNTER — Other Ambulatory Visit: Payer: Self-pay

## 2019-07-25 VITALS — BP 140/66 | HR 61 | Ht 61.0 in | Wt 168.6 lb

## 2019-07-25 DIAGNOSIS — E785 Hyperlipidemia, unspecified: Secondary | ICD-10-CM | POA: Diagnosis not present

## 2019-07-25 DIAGNOSIS — G4733 Obstructive sleep apnea (adult) (pediatric): Secondary | ICD-10-CM

## 2019-07-25 DIAGNOSIS — N052 Unspecified nephritic syndrome with diffuse membranous glomerulonephritis: Secondary | ICD-10-CM

## 2019-07-25 DIAGNOSIS — I10 Essential (primary) hypertension: Secondary | ICD-10-CM | POA: Diagnosis not present

## 2019-07-25 DIAGNOSIS — E118 Type 2 diabetes mellitus with unspecified complications: Secondary | ICD-10-CM | POA: Diagnosis not present

## 2019-07-25 NOTE — Progress Notes (Signed)
Patient ID: [name], female   DOB: [birth year], 68 y.o.   MRN: [id number]    Primary M.D.: [name] Primary nephrologist: [name]  HPI: [name] is a 68 y.o. female who presents for a 7 month cardiology evaluation.  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 She also has a history of hypertension, hypothyroidism,  DM2, GERD as well as obstructive sleep apnea and has only  intermittently utilized CPAP therapy. Her OSA is moderate with an AHI of 20.9 but severe during REM sleep with an AHI of 54.3 which was noted on her initial sleep study in 2009.  She also has a history of hyperlipidemia and has been on combination therapy with fenofibrate and Crestor.  She underwent another kidney biopsy in October 2014.  This did not show any significant pathologic change.  She has been on direct renin inhibition and ARB therapy in attempt to reduce protein excretion.  She tells recently, her proteinuria has increased to 10 grams. She has a history of hypothyroidism and is on thyroid replacement.   She has a history of hyperlipidemia and has been on combination Crestor and fenofibrate.  In addition to her tekturna and micardis she also has been taking Lasix 40 mg daily, Toprol-XL 50 mg and Cardizem 240 mg for additional blood pressure control.  She is diabetic on Levemir insulin.  She no longer is taking Tradjenta.  She now sees [name] for her endocrinologic care.  She denies chest pain brought on with exertion.  She admits to ankle swelling.  He is followed by [name] dating for renal disease.  Her creatinine had risen up to 2.4 but most recently this had improved to 2.0.  She was started on a trial of cyclosporine, but she did not tolerate this.  She had experienced some rare to occasional palpitations.  This seemed to occur when she was on levothyroxine at 225 g.  Her symptoms  improved with dose reduction down to 200 g.  Her Synthroid dose was further reduced to 175 g.  In August 2018, TSH was 0.08.  She is by [name] for her nephrotic range proteinuria.  When I last saw her in November 2018   protein to creatinine ratio was 20,927.  Since I last saw her, she ultimately was started on Cytoxan in January 2019 and also was on prednisone.  She developed leg swelling on prednisone treated with Lasix.  Recently due to anemia her Cytoxan dose was reduced from 150 mg down to 75 mg.  She recently saw [name] on Jan 17, 2018.  Hematocrit was 27.9.  Glucose was increased.  BUN 14 creatinine 1.23.   I saw [name] in June 2019.  I reviewed her recent evaluation from nephrology.  She informed me that she had not used CPAP in over 2 years.  She was snoring loudly and was waking up at least 3-4 times per night for urination.  I had a long discussion with her regarding adverse consequences of untreated sleep apnea particularly on her cardiovascular health.  She ultimately agreed to undergo a sleep study and reassess her sleep apnea.  The sleep study was done on February 12, 2018 and demonstrated severe sleep apnea with an AHI of 44.1 and RDI 52.6/h.  She had severe oxygen desaturation to a nadir of 73%.  There was soft snoring.  She was titrated on CPAP  and 11 cm water pressure was recommended.  Of note, she did have bruxism.  Her CPAP set up date was February 22, 2018 with [facility] as her DME company.  A download from April 24, 2018 through May 23, 2018 shows that she is meeting compliance with 90% of usage days and usage greater than 4 hours at 80%.  She was averaging 6 hours and 14 minutes of CPAP use.  AHI is 4.0.  On several nights she did have moderate mask leak.  She has noticed that she has felt well with therapy.  However, upon further questioning she often goes to bed around 10 PM and may wake up at 8 AM.  I have recommended CPAP for the entire duration of sleep  particularly since the preponderance of rem sleep occurs in the second half of the night when her sleep apnea is worse.  Presently, she is without chest pain.  She saw [name] in August 2019 and BUN was 22 creatinine 1.33.  She was anemic with a hemoglobin of 9 hematocrit 26.8.    She was hospitalized in late February 2020 after developing nausea vomiting and diarrhea and was found to have Campylobacter enteritis.  Since that time she had been taken off cyclophosphamide in addition to telmisartan.    I last evaluated her by telemedicine in May 2020 Her blood pressure off telmisartan wasrunning around 130/78 but there have been several instances where her blood pressure had decreased [id number] systolic and she had been advised by her primary to hold metoprolol if that happened.  She noted on the days she did hold the metoprolol that her pulse would increase particularly by the end of the night.  She continued to take the furosemide at the 80 mg dose and usually by the end of the day notes some return of some mild leg swelling.  She denied any episode of chest pain PND orthopnea.  She denies any definitive shortness of breath as long as she is not very active.  However, to get her mail she has to walk up a hill and noticed some shortness of breath with a walking.  Her sleeping is significantly improved and she admits to markedly improved CPAP compliance. I was able to obtain a download from her CPAP unit from August 18 through Jan 07, 2019.  There were 3 days where she did not use therapy due to residual nausea.  However she is meeting compliance standards with 90% of usage.  Her average usage is 7 hours and 4 minutes.  At her 11 cm water pressure, AHI is excellent at 1.8.  She typically could may go to the bathroom 1-2 times per night.  She wasunaware of any breakthrough snoring; sleep was restorative.  She denied daytime sleepiness.   Since her last evaluation, she states that she had to short fleeting  episodes of chest pain.  The first episode occurred in July and the more recent in November.  These typically are sharp at first and then may be associated with chest pressure.  However they only last seconds.  There is no exertional component.  She denies palpitations.  She denies any change in shortness of breath.  She underwent an echo Doppler study on July 11, 2019 which revealed an EF of 60 to 65%.  There was no LVH.  There was mild left atrial dilatation.  There was mild-moderate aortic sclerosis without stenosis.  She presents for evaluation.  Past Medical History:  Diagnosis Date  Anemia    "as a kid" (06/18/2013)   Anginal pain ([place])    "often; usually when thyroid goes out of wack and heart starts fluttering" (06/18/2013)   Arthritis    "joints probably" (101/27/2014)   Carotid stenosis ~ 01/2013   "bilaterally; ~ 50%; dx'd/carotid US" (06/18/2013)   Dysrhythmia    "palpitation-type feelings; related to thyroid" (06/18/2013)   Exertional shortness of breath    GERD (gastroesophageal reflux disease)    Gout    Headache(784.0)    "monthly" (06/18/2013)   Heart murmur    Hepatitis, unspecified 1972   "husband had hepatitis; my mother, daughter, and me all had to get the gammaglobulin shots" (06/18/2013)   High cholesterol    Hypertension    Hypothyroidism    Membranous glomerulonephritis    Pneumonia ?1980's; 09/2011   "twice" (06/18/2013)   Sleep apnea    "have mask; haven't worn it in ~ 1 yr" (06/18/2013)   Type II diabetes mellitus [facility])     Past Surgical History:  Procedure Laterality Date   BUNIONECTOMY Bilateral 1994-1997   "once on each side" (06/18/2013)   [place]; [place] 12/03/2015   Procedure: COLONOSCOPY;  Surgeon: Daneil Dolin, MD;  Location: [facility];  Service: Endoscopy;  Laterality: N/A;  8:30 AM   RENAL BIOPSY Left ~ 1997; 06/18/2013   SHOULDER ARTHROSCOPY W/  ROTATOR CUFF REPAIR Right 2009   TONSILLECTOMY      Allergies  Allergen Reactions   Codeine Nausea And Vomiting   Ibuprofen Other (See Comments)    Per kidney MD.   Nsaids Other (See Comments)    Per Kidney DR. No NSAIDS   Shellfish Allergy Other (See Comments)    REACTION: Gout Flare-ups    Septra Ds [Sulfamethoxazole-Trimethoprim] Itching and Rash    Current Outpatient Medications  Medication Sig Dispense Refill   allopurinol (ZYLOPRIM) 300 MG tablet Take 300 mg by mouth daily.     aspirin 81 MG tablet Take 81 mg by mouth daily.     cetirizine (ZYRTEC) 10 MG tablet Take 10 mg by mouth at bedtime.     diltiazem (CARDIZEM CD) 240 MG 24 hr capsule Take 240 mg by mouth at bedtime.     Esomeprazole Magnesium (NEXIUM PO) Take 40 mg by mouth daily.      fenofibrate (TRICOR) 145 MG tablet Take 145 mg by mouth at bedtime.      furosemide (LASIX) 40 MG tablet Take 2 tablets (80 mg total) by mouth daily.     insulin aspart (NOVOLOG) 100 UNIT/ML injection Inject 3-11 Units into the skin 3 (three) times daily before meals. On sliding scale     insulin detemir (LEVEMIR) 100 UNIT/ML injection Inject 0.1 mLs (10 Units total) into the skin daily. 25 Units AM; 10 units PM (Patient taking differently: Inject 10-25 Units into the skin daily. 25 Units AM; 10 units PM)     metoprolol succinate (TOPROL-XL) 100 MG 24 hr tablet TAKE 1 TABLET BY MOUTH EVERY DAY WITH FOOD (Patient taking differently: Take 100 mg by mouth daily. ) 90 tablet 3   Multiple Vitamin (MULTIVITAMIN WITH MINERALS) TABS tablet Take 1 tablet by mouth daily. Reported on 10/28/2015     ondansetron (ZOFRAN ODT) 4 MG disintegrating tablet Take 1 tablet (4 mg total) by mouth every 8 (eight) hours as needed for nausea or vomiting. 20 tablet 0   rOPINIRole (REQUIP) 0.5 MG tablet Take  1 mg by mouth at bedtime.      rosuvastatin (CRESTOR) 40 MG tablet Take 40 mg by mouth at bedtime.     SYNTHROID 175 MCG tablet Take 175 mcg by  mouth daily.   0   nitroGLYCERIN (NITROSTAT) 0.4 MG SL tablet PLACE 1 TABLET (0.4 MG TOTAL) UNDER THE TONGUE EVERY 5 (FIVE) MINUTES AS NEEDED FOR CHEST PAIN. 25 tablet 3   No current facility-administered medications for this visit.     Social History   Socioeconomic History   Marital status: Married    Spouse name: Not on file   Number of children: Not on file   Years of education: Not on file   Highest education level: Not on file  Occupational History   Not on file  Social Needs   Financial resource strain: Not on file   Food insecurity    Worry: Not on file    Inability: Not on file   Transportation needs    Medical: Not on file    Non-medical: Not on file  Tobacco Use   Smoking status: Never Smoker   Smokeless tobacco: Never Used  Substance and Sexual Activity   Alcohol use: No   Drug use: No   Sexual activity: Never  Lifestyle   Physical activity    Days per week: Not on file    Minutes per session: Not on file   Stress: Not on file  Relationships   Social connections    Talks on phone: Not on file    Gets together: Not on file    Attends religious service: Not on file    Active member of club or organization: Not on file    Attends meetings of clubs or organizations: Not on file    Relationship status: Not on file   Intimate partner violence    Fear of current or ex partner: Not on file    Emotionally abused: Not on file    Physically abused: Not on file    Forced sexual activity: Not on file  Other Topics Concern   Not on file  Social History Narrative   Not on file   Socially, she isretired.  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  ROS General: Negative; No fevers, chills, or night sweats;  HEENT: Negative; No changes in vision or hearing, sinus congestion, difficulty swallowing Pulmonary: Negative; No cough, wheezing, shortness of breath, hemoptysis Cardiovascular: Negative; No chest pain,  presyncope, syncope, palpitations Positive for occasional edema GI: Negative; No nausea, vomiting, diarrhea, or abdominal pain GU: Negative; No dysuria, hematuria, or difficulty voiding Renal: Positive for membranous glomerulonephropathy with nephrotic proteinuria Musculoskeletal: Positive for TMJ; no myalgias, joint pain, or weakness Hematologic/Oncology: Negative; no easy bruising, bleeding Endocrine: Negative; no heat/cold intolerance; no diabetes Neuro: Negative; no changes in balance, headaches Skin: Negative; No rashes or skin lesions Psychiatric: Negative; No behavioral problems, depression Sleep: Positive for OSA; now back on CPAP with recent set up date February 22, 2018 Other comprehensive 14 point system review is negative.  PE BP 140/66    Pulse 61    Ht '5\' 1"'  (1.549 m)    Wt 168 lb 9.6 oz (76.5 kg)    SpO2 98%    BMI 31.86 kg/m   Repeat blood pressure by me 128/70 supine and 128/70 standing  Wt Readings from Last 3 Encounters:  07/25/19 168 lb 9.6 oz (76.5 kg)  01/08/19 150 lb (68 kg)  10/17/18 154  lb 1.6 oz (69.9 kg)   General: Alert, oriented, no distress.  Skin: normal turgor, no rashes, warm and dry HEENT: Normocephalic, atraumatic. Pupils equal round and reactive to light; sclera anicteric; extraocular muscles intact;  Nose without nasal septal hypertrophy Mouth/Parynx benign; Mallinpatti scale 3/4 Neck: No JVD, no carotid bruits; normal carotid upstroke Lungs: clear to ausculatation and percussion; no wheezing or rales Chest wall: without tenderness to palpitation Heart: PMI not displaced, RRR, s1 s2 normal, 1/6 systolic murmur, no diastolic murmur, no rubs, gallops, thrills, or heaves Abdomen: soft, nontender; no hepatosplenomehaly, BS+; abdominal aorta nontender and not dilated by palpation. Back: no CVA tenderness Pulses 2+ Musculoskeletal: full range of motion, normal strength, no joint deformities Extremities: no clubbing cyanosis or edema, Homan's sign  negative  Neurologic: grossly nonfocal; Cranial nerves grossly wnl Psychologic: Normal mood and affect   ECG (independently read by me): Rhythm at 61 bpm.  No ectopy.  Normal intervals.  October 2019 ECG (independently read by me): Normal sinus rhythm at 68 bpm.  Poor R wave progression anterolaterally  June 2019 ECG (independently read by me): Sinus at 64 bpm.  Normal intervals.  No ectopy.  November 2018 ECG (independently read by me): Normal sinus rhythm at 62 bpm.  No syncope.  No CT changes.  QTc interval 456 ms  May 2018 ECG (independently read by me): Sinus bradycardia 57 bpm.  PR interval 146 and QTc interval 447 ms.  No ectopy.  May 2017 ECG (independently read by me): Normal sinus rhythm at 75 bpm.  LVH by voltage criteria.  No significant ST changes.  QTc interval 460 ms.  ECG (independently read by me): Sinus rhythm at 99 bpm.  Left axis deviation.  Poor progression anteriorly.  August 2015 ECG (independently read by me): Normal sinus rhythm at 70 beats per minute.  QTc interval 423 ms.  PR interval normal at 150 ms.  Prior ECG: Normal sinus rhythm at 92 beats per minute. Poor R wave progression.  LABS: BMP Latest Ref Rng & Units 10/19/2018 10/18/2018 10/17/2018  Glucose 70 - 99 mg/dL 100(H) 90 142(H)  BUN 8 - 23 mg/dL 18 20 24(H)  Creatinine 0.44 - 1.00 mg/dL 1.85(H) 2.07(H) 2.60(H)  Sodium 135 - 145 mmol/L 144 139 132(L)  Potassium 3.5 - 5.1 mmol/L 4.1 3.0(L) 2.8(L)  Chloride 98 - 111 mmol/L 117(H) 108 99  CO2 22 - 32 mmol/L 20(L) 20(L) 18(L)  Calcium 8.9 - 10.3 mg/dL 8.7(L) 8.7(L) 8.7(L)   Hepatic Function Latest Ref Rng & Units 10/17/2018 10/15/2018 11/07/2011  Total Protein 6.5 - 8.1 g/dL 5.8(L) 6.9 6.6  Albumin 3.5 - 5.0 g/dL 2.5(L) 3.2(L) 3.3(L)  AST 15 - 41 U/L 13(L) 14(L) 36  ALT 0 - 44 U/L 13 15 52(H)  Alk Phosphatase 38 - 126 U/L 40 46 43  Total Bilirubin 0.3 - 1.2 mg/dL 0.6 0.4 0.3    CBC Latest Ref Rng & Units 10/19/2018 10/18/2018 10/17/2018  WBC 4.0 -  10.5 K/uL 6.1 5.8 7.1  Hemoglobin 12.0 - 15.0 g/dL 7.5(L) 7.6(L) 9.2(L)  Hematocrit 36.0 - 46.0 % 21.7(L) 22.0(L) 27.0(L)  Platelets 150 - 400 K/uL 231 203 203   . Lab Results  Component Value Date   MCV 106.9 (H) 10/19/2018   MCV 105.8 (H) 10/18/2018   MCV 108.0 (H) 10/17/2018   No results found for: TSH No results found for: HGBA1C   Lipid Panel  No results found for: CHOL, TRIG, HDL, CHOLHDL, VLDL, LDLCALC, LDLDIRECT   RADIOLOGY:  No results found.  IMPRESSION:  1. Essential hypertension   2. Membranous glomerulonephritis   3. Hyperlipidemia with target LDL less than 70   4. Obstructive sleep apnea (adult) (pediatric)   5. DM (diabetes mellitus) with complications [facility])     ASSESSMENT AND PLAN: Ms Islam is a 68 year old female who has a history of hypertension, hypothyroidism, GERD, obstructive sleep apnea, insulin-dependent diabetes mellitus as well as her nephrotic range proteinuria due to membranous glomerulonephritis.  Remotely she had been on combination therapy with  micardis 160 mg and tekturna 150 mg , both for blood pressure and her proteinuria.  She has been followed by [name] who will be slowing down his practice.  Her blood pressure today is stable without orthostatic change on diltiazem 240 mg, furosemide 80 mg daily, metoprolol succinate 100 mg daily.  She continues to be on rosuvastatin 40 mg and fenofibrate for mixed hyperlipidemia  with target LDL less than 70.  She had recently experienced nonischemic chest pain with atypical features.  She has noticed some exertional dyspnea.  I reviewed her most recent echo Doppler study which confirms normal systolic function.  There is mild to moderate aortic sclerosis.  There was no evidence for pulmonary hypertension.  She is now back on CPAP therapy.  BMI is 31.86.  Weight loss and exercise was recommended.  [name] is monitoring her proteinuria with reference to her membranous nephropathy.  As long as she is  stable, I will see her in 1 year for reevaluation.  Time spent: 25 minutes  Troy Sine, MD, [facility]  07/27/2019 3:28 PM

## 2019-07-25 NOTE — Patient Instructions (Addendum)
Medication Instructions:  Continue same medications *If you need a refill on your cardiac medications before your next appointment, please call your pharmacy*  Lab Work: None ordered   Testing/Procedures: None ordered  Follow-Up: At [facility], you and your health needs are our priority.  As part of our continuing mission to provide you with exceptional heart care, we have created designated Provider Care Teams.  These Care Teams include your primary Cardiologist (physician) and Advanced Practice Providers (APPs -  Physician Assistants and Nurse Practitioners) who all work together to provide you with the care you need, when you need it.  Your next appointment:  1 year  Call in Oct to schedule Dec appointment   The format for your next appointment:  Office   Provider:  [facility]

## 2019-07-27 ENCOUNTER — Encounter: Payer: Self-pay | Admitting: Cardiovascular Disease

## 2019-08-08 DIAGNOSIS — E118 Type 2 diabetes mellitus with unspecified complications: Secondary | ICD-10-CM | POA: Diagnosis not present

## 2019-08-08 DIAGNOSIS — E669 Obesity, unspecified: Secondary | ICD-10-CM | POA: Diagnosis not present

## 2019-08-08 DIAGNOSIS — N052 Unspecified nephritic syndrome with diffuse membranous glomerulonephritis: Secondary | ICD-10-CM | POA: Diagnosis not present

## 2019-08-08 DIAGNOSIS — N189 Chronic kidney disease, unspecified: Secondary | ICD-10-CM | POA: Diagnosis not present

## 2019-08-08 DIAGNOSIS — N2581 Secondary hyperparathyroidism of renal origin: Secondary | ICD-10-CM | POA: Diagnosis not present

## 2019-08-08 DIAGNOSIS — R809 Proteinuria, unspecified: Secondary | ICD-10-CM | POA: Diagnosis not present

## 2019-08-08 DIAGNOSIS — I129 Hypertensive chronic kidney disease with stage 1 through stage 4 chronic kidney disease, or unspecified chronic kidney disease: Secondary | ICD-10-CM | POA: Diagnosis not present

## 2019-08-08 DIAGNOSIS — N183 Chronic kidney disease, stage 3 unspecified: Secondary | ICD-10-CM | POA: Diagnosis not present

## 2019-08-08 DIAGNOSIS — E782 Mixed hyperlipidemia: Secondary | ICD-10-CM | POA: Diagnosis not present

## 2019-08-08 DIAGNOSIS — M1A30X0 Chronic gout due to renal impairment, unspecified site, without tophus (tophi): Secondary | ICD-10-CM | POA: Diagnosis not present

## 2019-08-08 DIAGNOSIS — D631 Anemia in chronic kidney disease: Secondary | ICD-10-CM | POA: Diagnosis not present

## 2019-08-10 ENCOUNTER — Other Ambulatory Visit: Payer: Self-pay | Admitting: Nephrology

## 2019-08-10 ENCOUNTER — Other Ambulatory Visit (HOSPITAL_COMMUNITY): Payer: Self-pay | Admitting: Nephrology

## 2019-08-10 DIAGNOSIS — N183 Chronic kidney disease, stage 3 unspecified: Secondary | ICD-10-CM

## 2019-08-15 DIAGNOSIS — N183 Chronic kidney disease, stage 3 unspecified: Secondary | ICD-10-CM | POA: Diagnosis not present

## 2019-08-20 ENCOUNTER — Other Ambulatory Visit: Payer: Self-pay

## 2019-08-20 ENCOUNTER — Ambulatory Visit (HOSPITAL_COMMUNITY)
Admission: RE | Admit: 2019-08-20 | Discharge: 2019-08-20 | Disposition: A | Discharge status: Home / Self Care | Payer: Medicare HMO | Source: Ambulatory Visit | Attending: Nephrology | Admitting: Nephrology

## 2019-08-20 DIAGNOSIS — N183 Chronic kidney disease, stage 3 unspecified: Secondary | ICD-10-CM | POA: Diagnosis not present

## 2019-08-20 DIAGNOSIS — E1165 Type 2 diabetes mellitus with hyperglycemia: Secondary | ICD-10-CM | POA: Diagnosis not present

## 2019-08-20 DIAGNOSIS — N189 Chronic kidney disease, unspecified: Secondary | ICD-10-CM | POA: Diagnosis not present

## 2019-08-20 DIAGNOSIS — N281 Cyst of kidney, acquired: Secondary | ICD-10-CM | POA: Diagnosis not present

## 2019-09-07 DIAGNOSIS — H5200 Hypermetropia, unspecified eye: Secondary | ICD-10-CM | POA: Diagnosis not present

## 2019-09-07 DIAGNOSIS — Z0100 Encounter for examination of eyes and vision without abnormal findings: Secondary | ICD-10-CM | POA: Diagnosis not present

## 2019-09-24 DIAGNOSIS — G4733 Obstructive sleep apnea (adult) (pediatric): Secondary | ICD-10-CM | POA: Diagnosis not present

## 2019-09-24 DIAGNOSIS — E1165 Type 2 diabetes mellitus with hyperglycemia: Secondary | ICD-10-CM | POA: Diagnosis not present

## 2019-10-10 DIAGNOSIS — N2581 Secondary hyperparathyroidism of renal origin: Secondary | ICD-10-CM | POA: Diagnosis not present

## 2019-10-10 DIAGNOSIS — I129 Hypertensive chronic kidney disease with stage 1 through stage 4 chronic kidney disease, or unspecified chronic kidney disease: Secondary | ICD-10-CM | POA: Diagnosis not present

## 2019-10-10 DIAGNOSIS — N183 Chronic kidney disease, stage 3 unspecified: Secondary | ICD-10-CM | POA: Diagnosis not present

## 2019-10-10 DIAGNOSIS — E118 Type 2 diabetes mellitus with unspecified complications: Secondary | ICD-10-CM | POA: Diagnosis not present

## 2019-10-10 DIAGNOSIS — D631 Anemia in chronic kidney disease: Secondary | ICD-10-CM | POA: Diagnosis not present

## 2019-10-10 DIAGNOSIS — M1A30X0 Chronic gout due to renal impairment, unspecified site, without tophus (tophi): Secondary | ICD-10-CM | POA: Diagnosis not present

## 2019-10-10 DIAGNOSIS — R809 Proteinuria, unspecified: Secondary | ICD-10-CM | POA: Diagnosis not present

## 2019-10-10 DIAGNOSIS — N189 Chronic kidney disease, unspecified: Secondary | ICD-10-CM | POA: Diagnosis not present

## 2019-10-10 DIAGNOSIS — N052 Unspecified nephritic syndrome with diffuse membranous glomerulonephritis: Secondary | ICD-10-CM | POA: Diagnosis not present

## 2019-10-10 DIAGNOSIS — E782 Mixed hyperlipidemia: Secondary | ICD-10-CM | POA: Diagnosis not present

## 2019-10-16 ENCOUNTER — Other Ambulatory Visit (HOSPITAL_COMMUNITY): Payer: Self-pay | Admitting: Obstetrics and Gynecology

## 2019-10-16 DIAGNOSIS — Z1231 Encounter for screening mammogram for malignant neoplasm of breast: Secondary | ICD-10-CM

## 2019-10-21 DIAGNOSIS — Z7984 Long term (current) use of oral hypoglycemic drugs: Secondary | ICD-10-CM | POA: Diagnosis not present

## 2019-10-21 DIAGNOSIS — I129 Hypertensive chronic kidney disease with stage 1 through stage 4 chronic kidney disease, or unspecified chronic kidney disease: Secondary | ICD-10-CM | POA: Diagnosis not present

## 2019-10-21 DIAGNOSIS — Z79899 Other long term (current) drug therapy: Secondary | ICD-10-CM | POA: Diagnosis not present

## 2019-10-21 DIAGNOSIS — E1122 Type 2 diabetes mellitus with diabetic chronic kidney disease: Secondary | ICD-10-CM | POA: Diagnosis not present

## 2019-10-22 ENCOUNTER — Encounter (HOSPITAL_COMMUNITY): Payer: Self-pay

## 2019-10-22 ENCOUNTER — Ambulatory Visit (HOSPITAL_COMMUNITY)
Admission: RE | Admit: 2019-10-22 | Discharge: 2019-10-22 | Disposition: A | Discharge status: Home / Self Care | Payer: Medicare HMO | Source: Ambulatory Visit | Attending: Obstetrics and Gynecology | Admitting: Obstetrics and Gynecology

## 2019-10-22 ENCOUNTER — Other Ambulatory Visit: Payer: Self-pay

## 2019-10-22 DIAGNOSIS — Z1231 Encounter for screening mammogram for malignant neoplasm of breast: Secondary | ICD-10-CM | POA: Insufficient documentation

## 2019-10-26 DIAGNOSIS — I129 Hypertensive chronic kidney disease with stage 1 through stage 4 chronic kidney disease, or unspecified chronic kidney disease: Secondary | ICD-10-CM | POA: Diagnosis not present

## 2019-10-29 ENCOUNTER — Ambulatory Visit (HOSPITAL_COMMUNITY)
Admission: RE | Admit: 2019-10-29 | Discharge: 2019-10-29 | Disposition: A | Discharge status: Home / Self Care | Payer: Medicare HMO | Source: Ambulatory Visit | Attending: Obstetrics and Gynecology | Admitting: Obstetrics and Gynecology

## 2019-10-29 ENCOUNTER — Other Ambulatory Visit: Payer: Self-pay

## 2019-10-29 DIAGNOSIS — Z1231 Encounter for screening mammogram for malignant neoplasm of breast: Secondary | ICD-10-CM | POA: Diagnosis not present

## 2019-10-30 DIAGNOSIS — E1165 Type 2 diabetes mellitus with hyperglycemia: Secondary | ICD-10-CM | POA: Diagnosis not present

## 2019-11-02 DIAGNOSIS — E039 Hypothyroidism, unspecified: Secondary | ICD-10-CM | POA: Diagnosis not present

## 2019-11-02 DIAGNOSIS — E7800 Pure hypercholesterolemia, unspecified: Secondary | ICD-10-CM | POA: Diagnosis not present

## 2019-11-02 DIAGNOSIS — I10 Essential (primary) hypertension: Secondary | ICD-10-CM | POA: Diagnosis not present

## 2019-11-02 DIAGNOSIS — E1165 Type 2 diabetes mellitus with hyperglycemia: Secondary | ICD-10-CM | POA: Diagnosis not present

## 2019-11-02 DIAGNOSIS — G4733 Obstructive sleep apnea (adult) (pediatric): Secondary | ICD-10-CM | POA: Diagnosis not present

## 2019-11-08 DIAGNOSIS — I129 Hypertensive chronic kidney disease with stage 1 through stage 4 chronic kidney disease, or unspecified chronic kidney disease: Secondary | ICD-10-CM | POA: Diagnosis not present

## 2019-11-26 DIAGNOSIS — E1165 Type 2 diabetes mellitus with hyperglycemia: Secondary | ICD-10-CM | POA: Diagnosis not present

## 2019-12-19 DIAGNOSIS — I129 Hypertensive chronic kidney disease with stage 1 through stage 4 chronic kidney disease, or unspecified chronic kidney disease: Secondary | ICD-10-CM | POA: Diagnosis not present

## 2019-12-19 DIAGNOSIS — N183 Chronic kidney disease, stage 3 unspecified: Secondary | ICD-10-CM | POA: Diagnosis not present

## 2019-12-19 DIAGNOSIS — N189 Chronic kidney disease, unspecified: Secondary | ICD-10-CM | POA: Diagnosis not present

## 2019-12-19 DIAGNOSIS — N2589 Other disorders resulting from impaired renal tubular function: Secondary | ICD-10-CM | POA: Diagnosis not present

## 2019-12-19 DIAGNOSIS — R809 Proteinuria, unspecified: Secondary | ICD-10-CM | POA: Diagnosis not present

## 2019-12-19 DIAGNOSIS — E7800 Pure hypercholesterolemia, unspecified: Secondary | ICD-10-CM | POA: Diagnosis not present

## 2019-12-19 DIAGNOSIS — N2581 Secondary hyperparathyroidism of renal origin: Secondary | ICD-10-CM | POA: Diagnosis not present

## 2019-12-19 DIAGNOSIS — M1A30X0 Chronic gout due to renal impairment, unspecified site, without tophus (tophi): Secondary | ICD-10-CM | POA: Diagnosis not present

## 2019-12-19 DIAGNOSIS — E118 Type 2 diabetes mellitus with unspecified complications: Secondary | ICD-10-CM | POA: Diagnosis not present

## 2019-12-19 DIAGNOSIS — E039 Hypothyroidism, unspecified: Secondary | ICD-10-CM | POA: Diagnosis not present

## 2019-12-19 DIAGNOSIS — E1165 Type 2 diabetes mellitus with hyperglycemia: Secondary | ICD-10-CM | POA: Diagnosis not present

## 2019-12-19 DIAGNOSIS — D631 Anemia in chronic kidney disease: Secondary | ICD-10-CM | POA: Diagnosis not present

## 2019-12-19 DIAGNOSIS — N052 Unspecified nephritic syndrome with diffuse membranous glomerulonephritis: Secondary | ICD-10-CM | POA: Diagnosis not present

## 2019-12-24 DIAGNOSIS — E1165 Type 2 diabetes mellitus with hyperglycemia: Secondary | ICD-10-CM | POA: Diagnosis not present

## 2020-01-01 DIAGNOSIS — N183 Chronic kidney disease, stage 3 unspecified: Secondary | ICD-10-CM | POA: Diagnosis not present

## 2020-01-04 DIAGNOSIS — N183 Chronic kidney disease, stage 3 unspecified: Secondary | ICD-10-CM | POA: Diagnosis not present

## 2020-01-21 DIAGNOSIS — E1122 Type 2 diabetes mellitus with diabetic chronic kidney disease: Secondary | ICD-10-CM | POA: Diagnosis not present

## 2020-01-21 DIAGNOSIS — I129 Hypertensive chronic kidney disease with stage 1 through stage 4 chronic kidney disease, or unspecified chronic kidney disease: Secondary | ICD-10-CM | POA: Diagnosis not present

## 2020-01-21 DIAGNOSIS — Z7984 Long term (current) use of oral hypoglycemic drugs: Secondary | ICD-10-CM | POA: Diagnosis not present

## 2020-01-21 DIAGNOSIS — N1832 Chronic kidney disease, stage 3b: Secondary | ICD-10-CM | POA: Diagnosis not present

## 2020-01-22 DIAGNOSIS — E1165 Type 2 diabetes mellitus with hyperglycemia: Secondary | ICD-10-CM | POA: Diagnosis not present

## 2020-02-08 DIAGNOSIS — Z6831 Body mass index (BMI) 31.0-31.9, adult: Secondary | ICD-10-CM | POA: Diagnosis not present

## 2020-02-08 DIAGNOSIS — Z0001 Encounter for general adult medical examination with abnormal findings: Secondary | ICD-10-CM | POA: Diagnosis not present

## 2020-02-08 DIAGNOSIS — E1129 Type 2 diabetes mellitus with other diabetic kidney complication: Secondary | ICD-10-CM | POA: Diagnosis not present

## 2020-02-08 DIAGNOSIS — Z1389 Encounter for screening for other disorder: Secondary | ICD-10-CM | POA: Diagnosis not present

## 2020-02-08 DIAGNOSIS — D649 Anemia, unspecified: Secondary | ICD-10-CM | POA: Diagnosis not present

## 2020-02-08 DIAGNOSIS — E1122 Type 2 diabetes mellitus with diabetic chronic kidney disease: Secondary | ICD-10-CM | POA: Diagnosis not present

## 2020-02-18 DIAGNOSIS — E1165 Type 2 diabetes mellitus with hyperglycemia: Secondary | ICD-10-CM | POA: Diagnosis not present

## 2020-02-18 DIAGNOSIS — E7800 Pure hypercholesterolemia, unspecified: Secondary | ICD-10-CM | POA: Diagnosis not present

## 2020-02-18 DIAGNOSIS — I10 Essential (primary) hypertension: Secondary | ICD-10-CM | POA: Diagnosis not present

## 2020-02-18 DIAGNOSIS — E039 Hypothyroidism, unspecified: Secondary | ICD-10-CM | POA: Diagnosis not present

## 2020-02-20 DIAGNOSIS — R809 Proteinuria, unspecified: Secondary | ICD-10-CM | POA: Diagnosis not present

## 2020-02-20 DIAGNOSIS — E039 Hypothyroidism, unspecified: Secondary | ICD-10-CM | POA: Diagnosis not present

## 2020-02-20 DIAGNOSIS — Z9225 Personal history of immunosupression therapy: Secondary | ICD-10-CM | POA: Diagnosis not present

## 2020-02-20 DIAGNOSIS — N1832 Chronic kidney disease, stage 3b: Secondary | ICD-10-CM | POA: Diagnosis not present

## 2020-02-20 DIAGNOSIS — Z7984 Long term (current) use of oral hypoglycemic drugs: Secondary | ICD-10-CM | POA: Diagnosis not present

## 2020-02-20 DIAGNOSIS — N052 Unspecified nephritic syndrome with diffuse membranous glomerulonephritis: Secondary | ICD-10-CM | POA: Diagnosis not present

## 2020-02-20 DIAGNOSIS — D631 Anemia in chronic kidney disease: Secondary | ICD-10-CM | POA: Diagnosis not present

## 2020-02-20 DIAGNOSIS — N183 Chronic kidney disease, stage 3 unspecified: Secondary | ICD-10-CM | POA: Diagnosis not present

## 2020-02-20 DIAGNOSIS — I129 Hypertensive chronic kidney disease with stage 1 through stage 4 chronic kidney disease, or unspecified chronic kidney disease: Secondary | ICD-10-CM | POA: Diagnosis not present

## 2020-02-20 DIAGNOSIS — E118 Type 2 diabetes mellitus with unspecified complications: Secondary | ICD-10-CM | POA: Diagnosis not present

## 2020-02-20 DIAGNOSIS — N2581 Secondary hyperparathyroidism of renal origin: Secondary | ICD-10-CM | POA: Diagnosis not present

## 2020-02-20 DIAGNOSIS — N2589 Other disorders resulting from impaired renal tubular function: Secondary | ICD-10-CM | POA: Diagnosis not present

## 2020-02-20 DIAGNOSIS — N189 Chronic kidney disease, unspecified: Secondary | ICD-10-CM | POA: Diagnosis not present

## 2020-02-20 DIAGNOSIS — E1122 Type 2 diabetes mellitus with diabetic chronic kidney disease: Secondary | ICD-10-CM | POA: Diagnosis not present

## 2020-02-26 DIAGNOSIS — R1032 Left lower quadrant pain: Secondary | ICD-10-CM | POA: Diagnosis not present

## 2020-02-26 DIAGNOSIS — G4733 Obstructive sleep apnea (adult) (pediatric): Secondary | ICD-10-CM | POA: Diagnosis not present

## 2020-02-26 DIAGNOSIS — E6609 Other obesity due to excess calories: Secondary | ICD-10-CM | POA: Diagnosis not present

## 2020-02-26 DIAGNOSIS — K5732 Diverticulitis of large intestine without perforation or abscess without bleeding: Secondary | ICD-10-CM | POA: Diagnosis not present

## 2020-02-26 DIAGNOSIS — Z6830 Body mass index (BMI) 30.0-30.9, adult: Secondary | ICD-10-CM | POA: Diagnosis not present

## 2020-03-14 DIAGNOSIS — Z01419 Encounter for gynecological examination (general) (routine) without abnormal findings: Secondary | ICD-10-CM | POA: Diagnosis not present

## 2020-03-17 DIAGNOSIS — E1165 Type 2 diabetes mellitus with hyperglycemia: Secondary | ICD-10-CM | POA: Diagnosis not present

## 2020-04-09 DIAGNOSIS — N2581 Secondary hyperparathyroidism of renal origin: Secondary | ICD-10-CM | POA: Diagnosis not present

## 2020-04-09 DIAGNOSIS — N189 Chronic kidney disease, unspecified: Secondary | ICD-10-CM | POA: Diagnosis not present

## 2020-04-09 DIAGNOSIS — E118 Type 2 diabetes mellitus with unspecified complications: Secondary | ICD-10-CM | POA: Diagnosis not present

## 2020-04-09 DIAGNOSIS — N052 Unspecified nephritic syndrome with diffuse membranous glomerulonephritis: Secondary | ICD-10-CM | POA: Diagnosis not present

## 2020-04-09 DIAGNOSIS — E039 Hypothyroidism, unspecified: Secondary | ICD-10-CM | POA: Diagnosis not present

## 2020-04-09 DIAGNOSIS — R809 Proteinuria, unspecified: Secondary | ICD-10-CM | POA: Diagnosis not present

## 2020-04-09 DIAGNOSIS — N183 Chronic kidney disease, stage 3 unspecified: Secondary | ICD-10-CM | POA: Diagnosis not present

## 2020-04-09 DIAGNOSIS — I129 Hypertensive chronic kidney disease with stage 1 through stage 4 chronic kidney disease, or unspecified chronic kidney disease: Secondary | ICD-10-CM | POA: Diagnosis not present

## 2020-04-09 DIAGNOSIS — N2589 Other disorders resulting from impaired renal tubular function: Secondary | ICD-10-CM | POA: Diagnosis not present

## 2020-04-09 DIAGNOSIS — D631 Anemia in chronic kidney disease: Secondary | ICD-10-CM | POA: Diagnosis not present

## 2020-04-14 DIAGNOSIS — N183 Chronic kidney disease, stage 3 unspecified: Secondary | ICD-10-CM | POA: Diagnosis not present

## 2020-04-14 DIAGNOSIS — E1165 Type 2 diabetes mellitus with hyperglycemia: Secondary | ICD-10-CM | POA: Diagnosis not present

## 2020-04-22 DIAGNOSIS — N1832 Chronic kidney disease, stage 3b: Secondary | ICD-10-CM | POA: Diagnosis not present

## 2020-04-22 DIAGNOSIS — Z7984 Long term (current) use of oral hypoglycemic drugs: Secondary | ICD-10-CM | POA: Diagnosis not present

## 2020-04-22 DIAGNOSIS — I129 Hypertensive chronic kidney disease with stage 1 through stage 4 chronic kidney disease, or unspecified chronic kidney disease: Secondary | ICD-10-CM | POA: Diagnosis not present

## 2020-04-22 DIAGNOSIS — E1122 Type 2 diabetes mellitus with diabetic chronic kidney disease: Secondary | ICD-10-CM | POA: Diagnosis not present

## 2020-04-25 DIAGNOSIS — N183 Chronic kidney disease, stage 3 unspecified: Secondary | ICD-10-CM | POA: Diagnosis not present

## 2020-05-12 DIAGNOSIS — E1165 Type 2 diabetes mellitus with hyperglycemia: Secondary | ICD-10-CM | POA: Diagnosis not present

## 2020-05-19 DIAGNOSIS — K5792 Diverticulitis of intestine, part unspecified, without perforation or abscess without bleeding: Secondary | ICD-10-CM | POA: Diagnosis not present

## 2020-05-21 DIAGNOSIS — Z20828 Contact with and (suspected) exposure to other viral communicable diseases: Secondary | ICD-10-CM | POA: Diagnosis not present

## 2020-05-22 ENCOUNTER — Inpatient Hospital Stay (HOSPITAL_COMMUNITY)
Admission: EM | Admit: 2020-05-22 | Discharge: 2020-06-23 | DRG: 870 | Disposition: E | Payer: Medicare HMO | Attending: Internal Medicine | Admitting: Internal Medicine

## 2020-05-22 ENCOUNTER — Emergency Department (HOSPITAL_COMMUNITY): Payer: Medicare HMO

## 2020-05-22 DIAGNOSIS — E872 Acidosis: Secondary | ICD-10-CM | POA: Diagnosis not present

## 2020-05-22 DIAGNOSIS — E875 Hyperkalemia: Secondary | ICD-10-CM | POA: Diagnosis not present

## 2020-05-22 DIAGNOSIS — E870 Hyperosmolality and hypernatremia: Secondary | ICD-10-CM | POA: Diagnosis not present

## 2020-05-22 DIAGNOSIS — G4733 Obstructive sleep apnea (adult) (pediatric): Secondary | ICD-10-CM | POA: Diagnosis not present

## 2020-05-22 DIAGNOSIS — J189 Pneumonia, unspecified organism: Secondary | ICD-10-CM | POA: Diagnosis not present

## 2020-05-22 DIAGNOSIS — E871 Hypo-osmolality and hyponatremia: Secondary | ICD-10-CM | POA: Diagnosis present

## 2020-05-22 DIAGNOSIS — I700 Atherosclerosis of aorta: Secondary | ICD-10-CM | POA: Diagnosis not present

## 2020-05-22 DIAGNOSIS — E1165 Type 2 diabetes mellitus with hyperglycemia: Secondary | ICD-10-CM | POA: Diagnosis present

## 2020-05-22 DIAGNOSIS — R6521 Severe sepsis with septic shock: Secondary | ICD-10-CM | POA: Diagnosis not present

## 2020-05-22 DIAGNOSIS — N184 Chronic kidney disease, stage 4 (severe): Secondary | ICD-10-CM | POA: Diagnosis present

## 2020-05-22 DIAGNOSIS — T380X5A Adverse effect of glucocorticoids and synthetic analogues, initial encounter: Secondary | ICD-10-CM | POA: Diagnosis not present

## 2020-05-22 DIAGNOSIS — A4189 Other specified sepsis: Secondary | ICD-10-CM | POA: Diagnosis not present

## 2020-05-22 DIAGNOSIS — E119 Type 2 diabetes mellitus without complications: Secondary | ICD-10-CM | POA: Diagnosis not present

## 2020-05-22 DIAGNOSIS — G9341 Metabolic encephalopathy: Secondary | ICD-10-CM | POA: Diagnosis not present

## 2020-05-22 DIAGNOSIS — J9691 Respiratory failure, unspecified with hypoxia: Secondary | ICD-10-CM | POA: Diagnosis present

## 2020-05-22 DIAGNOSIS — E11649 Type 2 diabetes mellitus with hypoglycemia without coma: Secondary | ICD-10-CM | POA: Diagnosis not present

## 2020-05-22 DIAGNOSIS — Z885 Allergy status to narcotic agent status: Secondary | ICD-10-CM

## 2020-05-22 DIAGNOSIS — I9589 Other hypotension: Secondary | ICD-10-CM | POA: Diagnosis not present

## 2020-05-22 DIAGNOSIS — R52 Pain, unspecified: Secondary | ICD-10-CM | POA: Diagnosis not present

## 2020-05-22 DIAGNOSIS — J9690 Respiratory failure, unspecified, unspecified whether with hypoxia or hypercapnia: Secondary | ICD-10-CM | POA: Diagnosis not present

## 2020-05-22 DIAGNOSIS — N170 Acute kidney failure with tubular necrosis: Secondary | ICD-10-CM | POA: Diagnosis not present

## 2020-05-22 DIAGNOSIS — I129 Hypertensive chronic kidney disease with stage 1 through stage 4 chronic kidney disease, or unspecified chronic kidney disease: Secondary | ICD-10-CM | POA: Diagnosis present

## 2020-05-22 DIAGNOSIS — Z79890 Hormone replacement therapy (postmenopausal): Secondary | ICD-10-CM

## 2020-05-22 DIAGNOSIS — K219 Gastro-esophageal reflux disease without esophagitis: Secondary | ICD-10-CM | POA: Diagnosis present

## 2020-05-22 DIAGNOSIS — Z882 Allergy status to sulfonamides status: Secondary | ICD-10-CM

## 2020-05-22 DIAGNOSIS — J90 Pleural effusion, not elsewhere classified: Secondary | ICD-10-CM | POA: Diagnosis not present

## 2020-05-22 DIAGNOSIS — E874 Mixed disorder of acid-base balance: Secondary | ICD-10-CM | POA: Diagnosis present

## 2020-05-22 DIAGNOSIS — J8489 Other specified interstitial pulmonary diseases: Secondary | ICD-10-CM | POA: Diagnosis not present

## 2020-05-22 DIAGNOSIS — E861 Hypovolemia: Secondary | ICD-10-CM | POA: Diagnosis not present

## 2020-05-22 DIAGNOSIS — R609 Edema, unspecified: Secondary | ICD-10-CM | POA: Diagnosis not present

## 2020-05-22 DIAGNOSIS — R0902 Hypoxemia: Secondary | ICD-10-CM

## 2020-05-22 DIAGNOSIS — Z91013 Allergy to seafood: Secondary | ICD-10-CM

## 2020-05-22 DIAGNOSIS — Z978 Presence of other specified devices: Secondary | ICD-10-CM

## 2020-05-22 DIAGNOSIS — Z7982 Long term (current) use of aspirin: Secondary | ICD-10-CM

## 2020-05-22 DIAGNOSIS — Z6835 Body mass index (BMI) 35.0-35.9, adult: Secondary | ICD-10-CM | POA: Diagnosis not present

## 2020-05-22 DIAGNOSIS — I517 Cardiomegaly: Secondary | ICD-10-CM | POA: Diagnosis not present

## 2020-05-22 DIAGNOSIS — Z209 Contact with and (suspected) exposure to unspecified communicable disease: Secondary | ICD-10-CM | POA: Diagnosis not present

## 2020-05-22 DIAGNOSIS — J9601 Acute respiratory failure with hypoxia: Secondary | ICD-10-CM | POA: Diagnosis not present

## 2020-05-22 DIAGNOSIS — D62 Acute posthemorrhagic anemia: Secondary | ICD-10-CM | POA: Diagnosis not present

## 2020-05-22 DIAGNOSIS — J1282 Pneumonia due to coronavirus disease 2019: Secondary | ICD-10-CM | POA: Diagnosis present

## 2020-05-22 DIAGNOSIS — M109 Gout, unspecified: Secondary | ICD-10-CM | POA: Diagnosis present

## 2020-05-22 DIAGNOSIS — J069 Acute upper respiratory infection, unspecified: Secondary | ICD-10-CM | POA: Diagnosis present

## 2020-05-22 DIAGNOSIS — D6489 Other specified anemias: Secondary | ICD-10-CM | POA: Diagnosis present

## 2020-05-22 DIAGNOSIS — G4730 Sleep apnea, unspecified: Secondary | ICD-10-CM | POA: Diagnosis not present

## 2020-05-22 DIAGNOSIS — R0602 Shortness of breath: Secondary | ICD-10-CM | POA: Diagnosis not present

## 2020-05-22 DIAGNOSIS — Z7189 Other specified counseling: Secondary | ICD-10-CM

## 2020-05-22 DIAGNOSIS — M1990 Unspecified osteoarthritis, unspecified site: Secondary | ICD-10-CM | POA: Diagnosis present

## 2020-05-22 DIAGNOSIS — E039 Hypothyroidism, unspecified: Secondary | ICD-10-CM | POA: Diagnosis not present

## 2020-05-22 DIAGNOSIS — E669 Obesity, unspecified: Secondary | ICD-10-CM | POA: Diagnosis present

## 2020-05-22 DIAGNOSIS — J80 Acute respiratory distress syndrome: Secondary | ICD-10-CM | POA: Diagnosis not present

## 2020-05-22 DIAGNOSIS — R509 Fever, unspecified: Secondary | ICD-10-CM

## 2020-05-22 DIAGNOSIS — Z66 Do not resuscitate: Secondary | ICD-10-CM | POA: Diagnosis present

## 2020-05-22 DIAGNOSIS — R918 Other nonspecific abnormal finding of lung field: Secondary | ICD-10-CM | POA: Diagnosis not present

## 2020-05-22 DIAGNOSIS — E1122 Type 2 diabetes mellitus with diabetic chronic kidney disease: Secondary | ICD-10-CM | POA: Diagnosis present

## 2020-05-22 DIAGNOSIS — E1121 Type 2 diabetes mellitus with diabetic nephropathy: Secondary | ICD-10-CM | POA: Diagnosis not present

## 2020-05-22 DIAGNOSIS — R069 Unspecified abnormalities of breathing: Secondary | ICD-10-CM | POA: Diagnosis not present

## 2020-05-22 DIAGNOSIS — Z515 Encounter for palliative care: Secondary | ICD-10-CM | POA: Diagnosis not present

## 2020-05-22 DIAGNOSIS — I959 Hypotension, unspecified: Secondary | ICD-10-CM | POA: Diagnosis not present

## 2020-05-22 DIAGNOSIS — E785 Hyperlipidemia, unspecified: Secondary | ICD-10-CM | POA: Diagnosis present

## 2020-05-22 DIAGNOSIS — M40209 Unspecified kyphosis, site unspecified: Secondary | ICD-10-CM | POA: Diagnosis present

## 2020-05-22 DIAGNOSIS — Z4682 Encounter for fitting and adjustment of non-vascular catheter: Secondary | ICD-10-CM | POA: Diagnosis not present

## 2020-05-22 DIAGNOSIS — J942 Hemothorax: Secondary | ICD-10-CM

## 2020-05-22 DIAGNOSIS — E7800 Pure hypercholesterolemia, unspecified: Secondary | ICD-10-CM | POA: Diagnosis present

## 2020-05-22 DIAGNOSIS — E860 Dehydration: Secondary | ICD-10-CM | POA: Diagnosis present

## 2020-05-22 DIAGNOSIS — K922 Gastrointestinal hemorrhage, unspecified: Secondary | ICD-10-CM | POA: Diagnosis not present

## 2020-05-22 DIAGNOSIS — U071 COVID-19: Secondary | ICD-10-CM | POA: Diagnosis not present

## 2020-05-22 DIAGNOSIS — Z79899 Other long term (current) drug therapy: Secondary | ICD-10-CM

## 2020-05-22 DIAGNOSIS — Z886 Allergy status to analgesic agent status: Secondary | ICD-10-CM

## 2020-05-22 DIAGNOSIS — N289 Disorder of kidney and ureter, unspecified: Secondary | ICD-10-CM | POA: Diagnosis not present

## 2020-05-22 DIAGNOSIS — Z8249 Family history of ischemic heart disease and other diseases of the circulatory system: Secondary | ICD-10-CM

## 2020-05-22 DIAGNOSIS — K5939 Other megacolon: Secondary | ICD-10-CM | POA: Diagnosis not present

## 2020-05-22 DIAGNOSIS — N179 Acute kidney failure, unspecified: Secondary | ICD-10-CM | POA: Diagnosis not present

## 2020-05-22 DIAGNOSIS — M7981 Nontraumatic hematoma of soft tissue: Secondary | ICD-10-CM | POA: Diagnosis not present

## 2020-05-22 DIAGNOSIS — R21 Rash and other nonspecific skin eruption: Secondary | ICD-10-CM | POA: Diagnosis not present

## 2020-05-22 DIAGNOSIS — Z9911 Dependence on respirator [ventilator] status: Secondary | ICD-10-CM

## 2020-05-22 DIAGNOSIS — E876 Hypokalemia: Secondary | ICD-10-CM | POA: Diagnosis not present

## 2020-05-22 DIAGNOSIS — Z95828 Presence of other vascular implants and grafts: Secondary | ICD-10-CM

## 2020-05-22 DIAGNOSIS — R7401 Elevation of levels of liver transaminase levels: Secondary | ICD-10-CM | POA: Diagnosis not present

## 2020-05-22 DIAGNOSIS — Z794 Long term (current) use of insulin: Secondary | ICD-10-CM

## 2020-05-22 DIAGNOSIS — I10 Essential (primary) hypertension: Secondary | ICD-10-CM | POA: Diagnosis present

## 2020-05-22 DIAGNOSIS — T501X5A Adverse effect of loop [high-ceiling] diuretics, initial encounter: Secondary | ICD-10-CM | POA: Diagnosis not present

## 2020-05-22 LAB — CBC WITH DIFFERENTIAL/PLATELET
Abs Immature Granulocytes: 0.03 10*3/uL (ref 0.00–0.07)
Basophils Absolute: 0 10*3/uL (ref 0.0–0.1)
Basophils Relative: 0 %
Eosinophils Absolute: 0 10*3/uL (ref 0.0–0.5)
Eosinophils Relative: 0 %
HCT: 34.3 % — ABNORMAL LOW (ref 36.0–46.0)
Hemoglobin: 11.8 g/dL — ABNORMAL LOW (ref 12.0–15.0)
Immature Granulocytes: 0 %
Lymphocytes Relative: 15 %
Lymphs Abs: 1.1 10*3/uL (ref 0.7–4.0)
MCH: 32 pg (ref 26.0–34.0)
MCHC: 34.4 g/dL (ref 30.0–36.0)
MCV: 93 fL (ref 80.0–100.0)
Monocytes Absolute: 0.6 10*3/uL (ref 0.1–1.0)
Monocytes Relative: 8 %
Neutro Abs: 5.7 10*3/uL (ref 1.7–7.7)
Neutrophils Relative %: 77 %
Platelets: 324 10*3/uL (ref 150–400)
RBC: 3.69 MIL/uL — ABNORMAL LOW (ref 3.87–5.11)
RDW: 14.1 % (ref 11.5–15.5)
WBC: 7.5 10*3/uL (ref 4.0–10.5)
nRBC: 0 % (ref 0.0–0.2)

## 2020-05-22 LAB — COMPREHENSIVE METABOLIC PANEL
ALT: 19 U/L (ref 0–44)
AST: 38 U/L (ref 15–41)
Albumin: 2.7 g/dL — ABNORMAL LOW (ref 3.5–5.0)
Alkaline Phosphatase: 39 U/L (ref 38–126)
Anion gap: 12 (ref 5–15)
BUN: 44 mg/dL — ABNORMAL HIGH (ref 8–23)
CO2: 19 mmol/L — ABNORMAL LOW (ref 22–32)
Calcium: 8.5 mg/dL — ABNORMAL LOW (ref 8.9–10.3)
Chloride: 95 mmol/L — ABNORMAL LOW (ref 98–111)
Creatinine, Ser: 2.24 mg/dL — ABNORMAL HIGH (ref 0.44–1.00)
GFR calc Af Amer: 25 mL/min — ABNORMAL LOW (ref >60)
GFR calc non Af Amer: 22 mL/min — ABNORMAL LOW (ref >60)
Glucose, Bld: 198 mg/dL — ABNORMAL HIGH (ref 70–99)
Potassium: 4.5 mmol/L (ref 3.5–5.1)
Sodium: 126 mmol/L — ABNORMAL LOW (ref 135–145)
Total Bilirubin: 0.4 mg/dL (ref 0.3–1.2)
Total Protein: 6.8 g/dL (ref 6.5–8.1)

## 2020-05-22 LAB — FERRITIN: Ferritin: 2078 ng/mL — ABNORMAL HIGH (ref 11–307)

## 2020-05-22 LAB — CBG MONITORING, ED: Glucose-Capillary: 172 mg/dL — ABNORMAL HIGH (ref 70–99)

## 2020-05-22 LAB — TROPONIN I (HIGH SENSITIVITY)
Troponin I (High Sensitivity): 16 ng/L (ref <18)
Troponin I (High Sensitivity): 15 ng/L (ref <18)

## 2020-05-22 LAB — RESPIRATORY PANEL BY RT PCR (FLU A&B, COVID)
Influenza A by PCR: NEGATIVE
Influenza B by PCR: NEGATIVE
SARS Coronavirus 2 by RT PCR: POSITIVE — AB

## 2020-05-22 LAB — LACTIC ACID, PLASMA: Lactic Acid, Venous: 1.3 mmol/L (ref 0.5–1.9)

## 2020-05-22 LAB — FIBRINOGEN: Fibrinogen: 787 mg/dL — ABNORMAL HIGH (ref 210–475)

## 2020-05-22 LAB — PROCALCITONIN: Procalcitonin: 0.33 ng/mL

## 2020-05-22 LAB — LACTATE DEHYDROGENASE: LDH: 278 U/L — ABNORMAL HIGH (ref 98–192)

## 2020-05-22 LAB — D-DIMER, QUANTITATIVE: D-Dimer, Quant: 0.99 ug/mL-FEU — ABNORMAL HIGH (ref 0.00–0.50)

## 2020-05-22 MED ORDER — ADULT MULTIVITAMIN W/MINERALS CH
1.0000 | ORAL_TABLET | Freq: Every day | ORAL | Status: DC
  Administered 2020-05-23 – 2020-05-27 (×5): 1 via ORAL
  Filled 2020-05-22 (×5): qty 1

## 2020-05-22 MED ORDER — OXYCODONE HCL 5 MG PO TABS
5.0000 mg | ORAL_TABLET | ORAL | Status: DC | PRN
  Administered 2020-05-25 – 2020-05-27 (×5): 5 mg via ORAL
  Filled 2020-05-22 (×5): qty 1

## 2020-05-22 MED ORDER — INSULIN ASPART 100 UNIT/ML ~~LOC~~ SOLN: 0.0000 [IU] | Freq: Every day | SUBCUTANEOUS | Status: DC

## 2020-05-22 MED ORDER — SODIUM CHLORIDE 0.9 % IV SOLN: INTRAVENOUS | Status: DC

## 2020-05-22 MED ORDER — DILTIAZEM HCL ER COATED BEADS 240 MG PO CP24
240.0000 mg | ORAL_CAPSULE | Freq: Every day | ORAL | Status: DC
  Administered 2020-05-23 (×2): 240 mg via ORAL
  Filled 2020-05-22 (×6): qty 1

## 2020-05-22 MED ORDER — ACETAMINOPHEN 325 MG PO TABS
650.0000 mg | ORAL_TABLET | Freq: Four times a day (QID) | ORAL | Status: DC | PRN
  Administered 2020-06-01: 650 mg via ORAL
  Filled 2020-05-22: qty 2

## 2020-05-22 MED ORDER — ONDANSETRON HCL 4 MG/2ML IJ SOLN: 4.0000 mg | Freq: Four times a day (QID) | INTRAMUSCULAR | Status: DC | PRN

## 2020-05-22 MED ORDER — HEPARIN SODIUM (PORCINE) 5000 UNIT/ML IJ SOLN
5000.0000 [IU] | Freq: Three times a day (TID) | INTRAMUSCULAR | Status: DC
  Administered 2020-05-23 – 2020-05-27 (×15): 5000 [IU] via SUBCUTANEOUS
  Filled 2020-05-22 (×15): qty 1

## 2020-05-22 MED ORDER — ROPINIROLE HCL 1 MG PO TABS
1.0000 mg | ORAL_TABLET | Freq: Every day | ORAL | Status: DC
  Administered 2020-05-23 – 2020-05-26 (×6): 1 mg via ORAL
  Filled 2020-05-22 (×11): qty 1

## 2020-05-22 MED ORDER — GUAIFENESIN-DM 100-10 MG/5ML PO SYRP
10.0000 mL | ORAL_SOLUTION | ORAL | Status: DC | PRN
  Administered 2020-05-23 – 2020-05-25 (×2): 10 mL via ORAL
  Filled 2020-05-22 (×2): qty 10

## 2020-05-22 MED ORDER — INSULIN ASPART 100 UNIT/ML ~~LOC~~ SOLN
0.0000 [IU] | Freq: Three times a day (TID) | SUBCUTANEOUS | Status: DC
  Administered 2020-05-23: 20 [IU] via SUBCUTANEOUS
  Administered 2020-05-23: 15 [IU] via SUBCUTANEOUS
  Administered 2020-05-24: 3 [IU] via SUBCUTANEOUS
  Administered 2020-05-24: 11 [IU] via SUBCUTANEOUS
  Administered 2020-05-24: 7 [IU] via SUBCUTANEOUS
  Administered 2020-05-25 – 2020-05-26 (×3): 3 [IU] via SUBCUTANEOUS
  Filled 2020-05-22 (×2): qty 1

## 2020-05-22 MED ORDER — SODIUM CHLORIDE 0.9 % IV SOLN
100.0000 mg | INTRAVENOUS | Status: AC
  Filled 2020-05-22 (×2): qty 20

## 2020-05-22 MED ORDER — ROSUVASTATIN CALCIUM 20 MG PO TABS
40.0000 mg | ORAL_TABLET | Freq: Every day | ORAL | Status: DC
  Administered 2020-05-23 – 2020-05-26 (×5): 40 mg via ORAL
  Filled 2020-05-22 (×2): qty 2
  Filled 2020-05-22 (×3): qty 1
  Filled 2020-05-22: qty 2
  Filled 2020-05-22: qty 1
  Filled 2020-05-22: qty 2

## 2020-05-22 MED ORDER — DEXAMETHASONE SODIUM PHOSPHATE 10 MG/ML IJ SOLN
6.0000 mg | INTRAMUSCULAR | Status: DC
  Filled 2020-05-22: qty 1

## 2020-05-22 MED ORDER — ASPIRIN EC 81 MG PO TBEC
81.0000 mg | DELAYED_RELEASE_TABLET | Freq: Every day | ORAL | Status: DC
  Administered 2020-05-23 – 2020-05-27 (×5): 81 mg via ORAL
  Filled 2020-05-22 (×5): qty 1

## 2020-05-22 MED ORDER — IPRATROPIUM-ALBUTEROL 20-100 MCG/ACT IN AERS
1.0000 | INHALATION_SPRAY | Freq: Four times a day (QID) | RESPIRATORY_TRACT | Status: DC
  Administered 2020-05-23 – 2020-05-27 (×14): 1 via RESPIRATORY_TRACT
  Filled 2020-05-22: qty 4

## 2020-05-22 MED ORDER — SODIUM CHLORIDE 0.9 % IV SOLN: 100.0000 mg | Freq: Every day | INTRAVENOUS | Status: DC

## 2020-05-22 MED ORDER — FENOFIBRATE 160 MG PO TABS
160.0000 mg | ORAL_TABLET | Freq: Every day | ORAL | Status: DC
  Administered 2020-05-23 – 2020-05-27 (×5): 160 mg via ORAL
  Filled 2020-05-22 (×6): qty 1

## 2020-05-22 MED ORDER — HYDROCOD POLST-CPM POLST ER 10-8 MG/5ML PO SUER
5.0000 mL | Freq: Two times a day (BID) | ORAL | Status: DC | PRN
  Administered 2020-05-24 – 2020-05-27 (×5): 5 mL via ORAL
  Filled 2020-05-22 (×5): qty 5

## 2020-05-22 MED ORDER — METOPROLOL SUCCINATE ER 50 MG PO TB24
100.0000 mg | ORAL_TABLET | Freq: Every day | ORAL | Status: DC
  Administered 2020-05-23 – 2020-05-26 (×4): 100 mg via ORAL
  Filled 2020-05-22 (×2): qty 2
  Filled 2020-05-22: qty 4
  Filled 2020-05-22: qty 2

## 2020-05-22 MED ORDER — LEVOTHYROXINE SODIUM 75 MCG PO TABS
175.0000 ug | ORAL_TABLET | Freq: Every day | ORAL | Status: DC
  Administered 2020-05-23 – 2020-05-27 (×5): 175 ug via ORAL
  Filled 2020-05-22 (×4): qty 1
  Filled 2020-05-22: qty 4

## 2020-05-22 MED ORDER — DEXAMETHASONE SODIUM PHOSPHATE 10 MG/ML IJ SOLN
10.0000 mg | Freq: Once | INTRAMUSCULAR | Status: AC
  Administered 2020-05-23: 10 mg via INTRAVENOUS

## 2020-05-22 MED ORDER — INSULIN DETEMIR 100 UNIT/ML ~~LOC~~ SOLN
10.0000 [IU] | Freq: Every day | SUBCUTANEOUS | Status: DC
  Administered 2020-05-23: 10 [IU] via SUBCUTANEOUS
  Filled 2020-05-22 (×4): qty 0.1

## 2020-05-22 MED ORDER — ONDANSETRON HCL 4 MG PO TABS: 4.0000 mg | ORAL_TABLET | Freq: Four times a day (QID) | ORAL | Status: DC | PRN

## 2020-05-22 MED ORDER — POLYETHYLENE GLYCOL 3350 17 G PO PACK: 17.0000 g | PACK | Freq: Every day | ORAL | Status: DC | PRN

## 2020-05-22 NOTE — ED Notes (Signed)
CRITICAL VALUE ALERT  Critical Value:  covid +  Date & Time Notied:  05/16/2020 at 2151  Provider Notified: Dr. Eulis Foster notified via chat  Orders Received/Actions taken: no/na

## 2020-05-22 NOTE — ED Provider Notes (Signed)
[facility] EMERGENCY DEPARTMENT Provider Note   CSN: [id number] Arrival date & time: 05/11/2020  1837     History Chief Complaint  Patient presents with  . Shortness of Breath    [name] is a 69 y.o. female.  HPI She complains of cough for 6 weeks, worsened last several days.  She is not a smoker.  Husband has a similar illness for 2 days.  On presentation with EMS, her saturations were 79% on room air, and improved to 90% with nonrebreather oxygen treatment.  She denies sputum with coughing.  She denies documented fever, nausea, vomiting, weakness or dizziness.  She saw her PCP several days ago and had a Covid test but it has not yet resulted.  No other recent illnesses.  She denies chronic respiratory disorders.  There are no other known modifying factors.    Past Medical History:  Diagnosis Date  . Anemia    "as a kid" (06/18/2013)  . Anginal pain ([place])    "often; usually when thyroid goes out of wack and heart starts fluttering" (06/18/2013)  . Arthritis    "joints probably" (101/27/2014)  . Carotid stenosis ~ 01/2013   "bilaterally; ~ 50%; dx'd/carotid US" (06/18/2013)  . Dysrhythmia    "palpitation-type feelings; related to thyroid" (06/18/2013)  . Exertional shortness of breath   . GERD (gastroesophageal reflux disease)   . Gout   . YQIHKVQQ(595.6)    "monthly" (06/18/2013)  . Heart murmur   . Hepatitis, unspecified 1972   "husband had hepatitis; my mother, daughter, and me all had to get the gammaglobulin shots" (06/18/2013)  . High cholesterol   . Hypertension   . Hypothyroidism   . Membranous glomerulonephritis   . Pneumonia ?1980's; 09/2011   "twice" (06/18/2013)  . Sleep apnea    "have mask; haven't worn it in ~ 1 yr" (06/18/2013)  . Type II diabetes mellitus [facility])     Patient Active Problem List   Diagnosis Date Noted  . Respiratory failure with hypoxia ([place]) 05/13/2020  . Nausea vomiting and diarrhea 10/17/2018  . Hypokalemia 10/17/2018  . Acute  renal failure superimposed on stage 3 chronic kidney disease ([place]) 10/17/2018  . Campylobacter enteritis 10/17/2018  . History of colonic polyps   . Diverticulosis of colon without hemorrhage   . DM (diabetes mellitus) with complications ([place]) 38/75/6433  . HTN (hypertension) 04/08/2013  . Membranous glomerulonephritis 04/08/2013  . Hypothyroidism 04/08/2013  . GERD (gastroesophageal reflux disease) 04/08/2013  . OSA (obstructive sleep apnea) 04/08/2013  . Hyperlipidemia 04/08/2013  . DM2 (diabetes mellitus, type 2) ([place]) 04/08/2013    Past Surgical History:  Procedure Laterality Date  . BUNIONECTOMY Bilateral [id number]   "once on each side" (06/18/2013)  . CESAREAN SECTION  1971; 1977  . CHOLECYSTECTOMY  ?1998  . COLONOSCOPY N/A 12/03/2015   Procedure: COLONOSCOPY;  Surgeon: Daneil Dolin, MD;  Location: [facility] ENDO SUITE;  Service: Endoscopy;  Laterality: N/A;  8:30 AM  . RENAL BIOPSY Left ~ 1997; 06/18/2013  . SHOULDER ARTHROSCOPY W/ ROTATOR CUFF REPAIR Right 2009  . TONSILLECTOMY       OB History   No obstetric history on file.     Family History  Problem Relation Age of Onset  . Cirrhosis Mother 81       Non-alcoholic related  . Coronary artery disease Father     Social History   Tobacco Use  . Smoking status: Never Smoker  . Smokeless tobacco: Never Used  Substance Use Topics  .  Alcohol use: No  . Drug use: No    Home Medications Prior to Admission medications   Medication Sig Start Date End Date Taking? Authorizing Provider  allopurinol (ZYLOPRIM) 300 MG tablet Take 300 mg by mouth daily.    [provider]  aspirin 81 MG tablet Take 81 mg by mouth daily.    [provider]  cetirizine (ZYRTEC) 10 MG tablet Take 10 mg by mouth at bedtime.    [provider]  diltiazem (CARDIZEM CD) 240 MG 24 hr capsule Take 240 mg by mouth at bedtime.    [provider]  Esomeprazole Magnesium (NEXIUM PO) Take 40 mg by mouth daily.      [provider]  fenofibrate (TRICOR) 145 MG tablet Take 145 mg by mouth at bedtime.     [provider]  furosemide (LASIX) 40 MG tablet Take 2 tablets (80 mg total) by mouth daily. 10/26/18   Aline August, MD  insulin aspart (NOVOLOG) 100 UNIT/ML injection Inject 3-11 Units into the skin 3 (three) times daily before meals. On sliding scale    [provider]  insulin detemir (LEVEMIR) 100 UNIT/ML injection Inject 0.1 mLs (10 Units total) into the skin daily. 25 Units AM; 10 units PM Patient taking differently: Inject 10-25 Units into the skin daily. 25 Units AM; 10 units PM 10/19/18   Aline August, MD  metoprolol succinate (TOPROL-XL) 100 MG 24 hr tablet TAKE 1 TABLET BY MOUTH EVERY DAY WITH FOOD Patient taking differently: Take 100 mg by mouth daily.  10/03/18   Troy Sine, MD  Multiple Vitamin (MULTIVITAMIN WITH MINERALS) TABS tablet Take 1 tablet by mouth daily. Reported on 10/28/2015    [provider]  nitroGLYCERIN (NITROSTAT) 0.4 MG SL tablet PLACE 1 TABLET (0.4 MG TOTAL) UNDER THE TONGUE EVERY 5 (FIVE) MINUTES AS NEEDED FOR CHEST PAIN. 09/04/18 12/03/18  Troy Sine, MD  ondansetron (ZOFRAN ODT) 4 MG disintegrating tablet Take 1 tablet (4 mg total) by mouth every 8 (eight) hours as needed for nausea or vomiting. 10/19/18   Aline August, MD  rOPINIRole (REQUIP) 0.5 MG tablet Take 1 mg by mouth at bedtime.     [provider]  rosuvastatin (CRESTOR) 40 MG tablet Take 40 mg by mouth at bedtime.    [provider]  SYNTHROID 175 MCG tablet Take 175 mcg by mouth daily.  05/15/15   [provider]    Allergies    Codeine, Ibuprofen, Nsaids, Shellfish allergy, and Septra ds [sulfamethoxazole-trimethoprim]  Review of Systems   Review of Systems  All other systems reviewed and are negative.   Physical Exam Updated Vital Signs BP 105/79   Pulse 100   Temp 99.5 F (37.5 C) (Oral)   Resp (!) 31   Ht 5\' 1"  (1.549 m)   Wt  78.9 kg   SpO2 97%   BMI 32.88 kg/m   Physical Exam Vitals and nursing note reviewed.  Constitutional:      General: She is not in acute distress.    Appearance: She is well-developed. She is obese. She is not ill-appearing, toxic-appearing or diaphoretic.  HENT:     Head: Normocephalic and atraumatic.     Right Ear: External ear normal.     Left Ear: External ear normal.  Eyes:     Conjunctiva/sclera: Conjunctivae normal.     Pupils: Pupils are equal, round, and reactive to light.  Neck:     Trachea: Phonation normal.  Cardiovascular:  Rate and Rhythm: Normal rate.  Pulmonary:     Effort: Pulmonary effort is normal.  Abdominal:     General: There is no distension.  Musculoskeletal:        General: Normal range of motion.     Cervical back: Normal range of motion and neck supple.  Skin:    General: Skin is warm and dry.  Neurological:     Mental Status: She is alert and oriented to person, place, and time.     Cranial Nerves: No cranial nerve deficit.     Sensory: No sensory deficit.     Motor: No abnormal muscle tone.     Coordination: Coordination normal.  Psychiatric:        Mood and Affect: Mood normal.        Behavior: Behavior normal.        Thought Content: Thought content normal.        Judgment: Judgment normal.     ED Results / Procedures / Treatments   Labs (all labs ordered are listed, but only abnormal results are displayed) Labs Reviewed  RESPIRATORY PANEL BY RT PCR (FLU A&B, COVID) - Abnormal; Notable for the following components:      Result Value   SARS Coronavirus 2 by RT PCR POSITIVE (*)    All other components within normal limits  COMPREHENSIVE METABOLIC PANEL - Abnormal; Notable for the following components:   Sodium 126 (*)    Chloride 95 (*)    CO2 19 (*)    Glucose, Bld 198 (*)    BUN 44 (*)    Creatinine, Ser 2.24 (*)    Calcium 8.5 (*)    Albumin 2.7 (*)    GFR calc non Af Amer 22 (*)    GFR calc Af Amer 25 (*)    All other  components within normal limits  CBC WITH DIFFERENTIAL/PLATELET - Abnormal; Notable for the following components:   RBC 3.69 (*)    Hemoglobin 11.8 (*)    HCT 34.3 (*)    All other components within normal limits  FIBRINOGEN - Abnormal; Notable for the following components:   Fibrinogen 787 (*)    All other components within normal limits  FERRITIN - Abnormal; Notable for the following components:   Ferritin 2,078 (*)    All other components within normal limits  D-DIMER, QUANTITATIVE (NOT AT [facility]) - Abnormal; Notable for the following components:   D-Dimer, Quant 0.99 (*)    All other components within normal limits  LACTATE DEHYDROGENASE - Abnormal; Notable for the following components:   LDH 278 (*)    All other components within normal limits  PROCALCITONIN  HIV ANTIBODY (ROUTINE TESTING W REFLEX)  TROPONIN I (HIGH SENSITIVITY)  TROPONIN I (HIGH SENSITIVITY)    EKG EKG Interpretation  Date/Time:  Thursday May 22 2020 18:41:07 EDT Ventricular Rate:  106 PR Interval:    QRS Duration: 82 QT Interval:  328 QTC Calculation: 436 R Axis:   -33 Text Interpretation: Sinus tachycardia Left axis deviation Abnormal R-wave progression, late transition Since last tracing rate faster Otherwise no significant change Confirmed by Daleen Bo [phone]) on 05/14/2020 7:57:07 PM   Radiology DG Chest Port 1 View  Result Date: 05/10/2020 CLINICAL DATA:  Shortness of breath and cough. EXAM: PORTABLE CHEST 1 VIEW COMPARISON:  February 08, 2016 FINDINGS: Mild multifocal infiltrates are seen within the bilateral lung bases and periphery of the mid lung fields. There is no evidence of a pleural effusion or  pneumothorax. The heart size and mediastinal contours are within normal limits. Radiopaque surgical clips are seen overlying the right upper quadrant. The visualized skeletal structures are unremarkable. IMPRESSION: Mild bilateral multifocal infiltrates. Electronically Signed   By: Virgina Norfolk M.D.   On: 05/19/2020 21:07    Procedures .Critical Care Performed by: Daleen Bo, MD Authorized by: Daleen Bo, MD   Critical care provider statement:    Critical care time (minutes):  40   Critical care start time:  05/15/2020 7:55 PM   Critical care end time:  04/26/2020 10:50 PM   Critical care time was exclusive of:  Separately billable procedures and treating other patients   Critical care was time spent personally by me on the following activities:  Blood draw for specimens, development of treatment plan with patient or surrogate, discussions with consultants, evaluation of patient's response to treatment, examination of patient, obtaining history from patient or surrogate, ordering and performing treatments and interventions, ordering and review of laboratory studies, pulse oximetry, re-evaluation of patient's condition, review of old charts and ordering and review of radiographic studies   (including critical care time)  Medications Ordered in ED Medications  dexamethasone (DECADRON) injection 10 mg (has no administration in time range)  remdesivir 100 mg in sodium chloride 0.9 % 100 mL IVPB (has no administration in time range)    Followed by  remdesivir 100 mg in sodium chloride 0.9 % 100 mL IVPB (has no administration in time range)    ED Course  I have reviewed the triage vital signs and the nursing notes.  Pertinent labs & imaging results that were available during my care of the patient were reviewed by me and considered in my medical decision making (see chart for details).    MDM Rules/Calculators/A&P                           Patient Vitals for the past 24 hrs:  BP Temp Temp src Pulse Resp SpO2 Height Weight  04/30/2020 2130 105/79 -- -- 100 (!) 31 97 % -- --  05/17/2020 2100 121/63 -- -- (!) 102 (!) 30 96 % -- --  05/09/2020 2030 108/79 -- -- (!) 103 (!) 26 93 % -- --  05/08/2020 2000 (!) 107/52 -- -- (!) 101 (!) 29 95 % -- --  05/17/2020 1930 117/78 --  -- (!) 101 (!) 34 96 % -- --  05/08/2020 1900 (!) 117/102 -- -- (!) 101 (!) 25 96 % -- --  05/01/2020 1838 -- -- -- -- -- -- 5\' 1"  (1.549 m) 78.9 kg  05/15/2020 1837 113/74 99.5 F (37.5 C) Oral (!) 105 (!) 21 92 % -- --    10:50 PM Reevaluation with update and discussion. After initial assessment and treatment, an updated evaluation reveals no change in status, findings cussed with patient all questions answered. Daleen Bo   Medical Decision Making:  This patient is presenting for evaluation of respiratory illness, which does require a range of treatment options, and is a complaint that involves a high risk of morbidity and mortality. The differential diagnoses include pneumonia, COVID-19 infection, bronchitis. I decided to review old records, and in summary elderly female, with signs and symptoms of acute COVID-19 infection, she has not been vaccinated.  I did not require additional historical information from anyone.  Clinical Laboratory Tests Ordered, included CBC, Metabolic panel and Covid inflammatory markers. Review indicates Covid test positive, inflammatory markers present, chronic renal insufficiency  present white count normal. Radiologic Tests Ordered, included chest x-ray.  I independently Visualized: Radiographic images, which show multifocal infiltrates consistent with viral infection  Cardiac Monitor Tracing which shows sinus tachycardia   Critical Interventions-clinical evaluation, laboratory testing, oxygen therapy, remdesivir and Decadron, observation reassessment  [name] was evaluated in Emergency Department on 05/12/2020 for the symptoms described in the history of present illness. She was evaluated in the context of the global COVID-19 pandemic, which necessitated consideration that the patient might be at risk for infection with the SARS-CoV-2 virus that causes COVID-19. Institutional protocols and algorithms that pertain to the evaluation of patients at risk for  COVID-19 are in a state of rapid change based on information released by regulatory bodies including the CDC and federal and state organizations. These policies and algorithms were followed during the patient's care in the ED.  After These Interventions, the Patient was reevaluated and was found with Covid infection, requiring oxygen treatment. Also treated with remdesivir, and Decadron in the ED. Doubt bacterial infection or hemodynamic instability. Patient did not require advanced airway at this time.  [name] was evaluated in Emergency Department on 04/23/2020 for the symptoms described in the history of present illness. She was evaluated in the context of the global COVID-19 pandemic, which necessitated consideration that the patient might be at risk for infection with the SARS-CoV-2 virus that causes COVID-19. Institutional protocols and algorithms that pertain to the evaluation of patients at risk for COVID-19 are in a state of rapid change based on information released by regulatory bodies including the CDC and federal and state organizations. These policies and algorithms were followed during the patient's care in the ED.   CRITICAL CARE- yes Performed by: Daleen Bo  Nursing Notes Reviewed/ Care Coordinated Applicable Imaging Reviewed Interpretation of Laboratory Data incorporated into ED treatment  Discussed with hospitalist will admit the patient-10:48 PM   Final Clinical Impression(s) / ED Diagnoses Final diagnoses:  COVID-19 virus infection  Hypoxia    Rx / DC Orders ED Discharge Orders    None       Daleen Bo, MD 05/05/2020 2252

## 2020-05-22 NOTE — H&P (Signed)
TRH H&P    Patient Demographics:    [name], is a 69 y.o. female  MRN: [id number]  DOB - Jun 07, 1951  Admit Date - 05/18/2020  Referring MD/NP/PA: Eulis Foster  Outpatient Primary MD for the patient is Sharilyn Sites, MD  Patient coming from: Home  Chief complaint- "can't breathe"   HPI:    [name]  is a 69 y.o. female, with history of T2DM, OSA, Hothyroidism, HTN, HLD, hepatitis, and GERD presents to the ED with a chief complaint of dyspnea.  Patient reports that symptom onset was 5 weeks ago, and started with diverticulitis.  Patient reports that since then she has had dyspnea and cough that started simultaneously.  The dyspnea and cough became worse over the last 2 weeks.  Patient reports that dyspnea is worse with exertion, and she can even walk now.  She reports that she fell on Wednesday, which she attributes to being so weak.  She describes the fall as she was sitting in her chair got up took 2 steps and ended up on the ground.  She does not remember feeling lightheaded, or any preceding symptoms, and she did not hit her head.  Patient reports that she is weak because she has had decreased appetite and decreased p.o. intake since the diverticulitis 5 weeks ago.  Patient reports that her taste and smell are intact.  She does not have chest pain or palpitations.  She reports that her cough is a dry cough.  She had fevers for the first 3 weeks of her symptoms, but not for the last 2 weeks.  She reports that her T-max was 103.1, and Tylenol helps when she remembers to take it.  Patient has not had body aches, nausea vomiting, diarrhea, dysuria.  She does admit to shakiness, and vision changes over the last 2 weeks.  She reports that she cannot focus her eyes, so is blurry vision, but not double vision.  She does not have a sore throat and is not has clogged sinuses.  Patient did not get a Covid vaccine, because "I did not  want 1."  Patient is refusing remdesivir today because her daughter is a "conspiracy therapist" and does not want her taking it.  We discussed the difference between propaganda and medical journals, and the importance of having treatment while she is in the hospital of COVID-19.  Patient is still refusing remdesivir.  Patient reports that she wants CPR, and defibrillation if needed, but no intubation.    In the ED T 99.5, HR 105, R 21 - 34, BP 105/52 - 121/102 97% on 10L NRB WBC 7.5, Hgb 11.8 Na 126, BUN/Cr 44/2.24, glucose 198 Ferritin 2078 LDH 278 Trop 15, 16 Procal 0.33 CXR = Mild BL multifocal infiltrates EKG = ST, HR 106, QTc 436 Decadron and Remdesivir started D-Dimer 0.99 Covid positive Flu A and B negative     Review of systems:    In addition to the HPI above,  Review of Systems  Constitutional: Positive for chills, fever, malaise/fatigue and weight loss. Negative for diaphoresis.  HENT: Negative for congestion, hearing loss, nosebleeds, sore throat and tinnitus.   Eyes: Positive for blurred vision. Negative for double vision, photophobia and pain.  Respiratory: Positive for cough and shortness of breath. Negative for hemoptysis, sputum production and wheezing.   Cardiovascular: Negative for chest pain, palpitations, orthopnea and leg swelling.  Gastrointestinal: Negative for abdominal pain, blood in stool, diarrhea, melena, nausea and vomiting.  Genitourinary: Negative for dysuria, frequency and urgency.  Musculoskeletal: Positive for falls. Negative for back pain and myalgias.  Skin: Negative for itching and rash.  Neurological: Positive for weakness. Negative for dizziness, focal weakness and headaches.  Endo/Heme/Allergies: Negative for polydipsia.  Psychiatric/Behavioral: Negative for substance abuse.    All other systems reviewed and are negative.    Past History of the following :    Past Medical History:  Diagnosis Date  . Anemia    "as a kid"  (06/18/2013)  . Anginal pain ([place])    "often; usually when thyroid goes out of wack and heart starts fluttering" (06/18/2013)  . Arthritis    "joints probably" (101/27/2014)  . Carotid stenosis ~ 01/2013   "bilaterally; ~ 50%; dx'd/carotid US" (06/18/2013)  . Dysrhythmia    "palpitation-type feelings; related to thyroid" (06/18/2013)  . Exertional shortness of breath   . GERD (gastroesophageal reflux disease)   . Gout   . ELFYBOFB(510.2)    "monthly" (06/18/2013)  . Heart murmur   . Hepatitis, unspecified 1972   "husband had hepatitis; my mother, daughter, and me all had to get the gammaglobulin shots" (06/18/2013)  . High cholesterol   . Hypertension   . Hypothyroidism   . Membranous glomerulonephritis   . Pneumonia ?1980's; 09/2011   "twice" (06/18/2013)  . Sleep apnea    "have mask; haven't worn it in ~ 1 yr" (06/18/2013)  . Type II diabetes mellitus ([place])       Past Surgical History:  Procedure Laterality Date  . BUNIONECTOMY Bilateral [id number]   "once on each side" (06/18/2013)  . CESAREAN SECTION  1971; 1977  . CHOLECYSTECTOMY  ?1998  . COLONOSCOPY N/A 12/03/2015   Procedure: COLONOSCOPY;  Surgeon: Daneil Dolin, MD;  Location: [facility];  Service: Endoscopy;  Laterality: N/A;  8:30 AM  . RENAL BIOPSY Left ~ 1997; 06/18/2013  . SHOULDER ARTHROSCOPY W/ ROTATOR CUFF REPAIR Right 2009  . TONSILLECTOMY        Social History:      Social History   Tobacco Use  . Smoking status: Never Smoker  . Smokeless tobacco: Never Used  Substance Use Topics  . Alcohol use: No       Family History :     Family History  Problem Relation Age of Onset  . Cirrhosis Mother 72       Non-alcoholic related  . Coronary artery disease Father       Home Medications:   Prior to Admission medications   Medication Sig Start Date End Date Taking? Authorizing Provider  allopurinol (ZYLOPRIM) 300 MG tablet Take 300 mg by mouth daily.    [provider]  aspirin  81 MG tablet Take 81 mg by mouth daily.    [provider]  cetirizine (ZYRTEC) 10 MG tablet Take 10 mg by mouth at bedtime.    [provider]  diltiazem (CARDIZEM CD) 240 MG 24 hr capsule Take 240 mg by mouth at bedtime.    [provider]  Esomeprazole Magnesium (NEXIUM PO) Take 40 mg by mouth daily.  [provider]  fenofibrate (TRICOR) 145 MG tablet Take 145 mg by mouth at bedtime.     [provider]  furosemide (LASIX) 40 MG tablet Take 2 tablets (80 mg total) by mouth daily. 10/26/18   Aline August, MD  insulin aspart (NOVOLOG) 100 UNIT/ML injection Inject 3-11 Units into the skin 3 (three) times daily before meals. On sliding scale    [provider]  insulin detemir (LEVEMIR) 100 UNIT/ML injection Inject 0.1 mLs (10 Units total) into the skin daily. 25 Units AM; 10 units PM Patient taking differently: Inject 10-25 Units into the skin daily. 25 Units AM; 10 units PM 10/19/18   Aline August, MD  metoprolol succinate (TOPROL-XL) 100 MG 24 hr tablet TAKE 1 TABLET BY MOUTH EVERY DAY WITH FOOD Patient taking differently: Take 100 mg by mouth daily.  10/03/18   Troy Sine, MD  Multiple Vitamin (MULTIVITAMIN WITH MINERALS) TABS tablet Take 1 tablet by mouth daily. Reported on 10/28/2015    [provider]  nitroGLYCERIN (NITROSTAT) 0.4 MG SL tablet PLACE 1 TABLET (0.4 MG TOTAL) UNDER THE TONGUE EVERY 5 (FIVE) MINUTES AS NEEDED FOR CHEST PAIN. 09/04/18 12/03/18  Troy Sine, MD  ondansetron (ZOFRAN ODT) 4 MG disintegrating tablet Take 1 tablet (4 mg total) by mouth every 8 (eight) hours as needed for nausea or vomiting. 10/19/18   Aline August, MD  rOPINIRole (REQUIP) 0.5 MG tablet Take 1 mg by mouth at bedtime.     [provider]  rosuvastatin (CRESTOR) 40 MG tablet Take 40 mg by mouth at bedtime.    [provider]  SYNTHROID 175 MCG tablet Take 175 mcg by mouth daily.  05/15/15   [provider]      Allergies:     Allergies  Allergen Reactions  . Codeine Nausea And Vomiting  . Ibuprofen Other (See Comments)    Per kidney MD.  . Nsaids Other (See Comments)    Per Kidney DR. No NSAIDS  . Shellfish Allergy Other (See Comments)    REACTION: Gout Flare-ups   . Septra Ds [Sulfamethoxazole-Trimethoprim] Itching and Rash     Physical Exam:   Vitals  Blood pressure 105/79, pulse 100, temperature 99.5 F (37.5 C), temperature source Oral, resp. rate (!) 31, height 5\' 1"  (1.549 m), weight 78.9 kg, SpO2 97 %.  1.  General: Lying supine in bed, nonrebreather on, appears older than stated age  64. Psychiatric: Mood is normal for situation Very little insight into healthcare Rational decision making regarding medications and treatment  3. Neurologic: Cranial nerves II through XII are intact, no focal deficit on limited exam, moves all 4 extremities voluntarily  4. HEENMT:  Head is atraumatic, normocephalic, pupils are reactive to light, neck is supple, trachea is midline, mucous membranes are dry  5. Respiratory : Diminished breath sounds in the lower lung fields, no wheezing rhonchi or rales  6. Cardiovascular : Heart rate and rhythm are regular, no murmurs rubs or gallops  7. Gastrointestinal:  Abdomen is soft, nondistended, nontender to palpation  8. Skin:  No acute lesions on limited skin exam  9.Musculoskeletal:  No acute deformity trace peripheral edema    Data Review:    CBC Recent Labs  Lab 04/26/2020 2011  WBC 7.5  HGB 11.8*  HCT 34.3*  PLT 324  MCV 93.0  MCH 32.0  MCHC 34.4  RDW 14.1  LYMPHSABS 1.1  MONOABS 0.6  EOSABS 0.0  BASOSABS 0.0   ------------------------------------------------------------------------------------------------------------------  Results  for orders placed or performed during the hospital encounter of 04/29/2020 (from the past 48 hour(s))  Comprehensive metabolic panel     Status: Abnormal   Collection Time: 05/12/2020   8:11 PM  Result Value Ref Range   Sodium 126 (L) 135 - 145 mmol/L   Potassium 4.5 3.5 - 5.1 mmol/L   Chloride 95 (L) 98 - 111 mmol/L   CO2 19 (L) 22 - 32 mmol/L   Glucose, Bld 198 (H) 70 - 99 mg/dL    Comment: Glucose reference range applies only to samples taken after fasting for at least 8 hours.   BUN 44 (H) 8 - 23 mg/dL   Creatinine, Ser 2.24 (H) 0.44 - 1.00 mg/dL   Calcium 8.5 (L) 8.9 - 10.3 mg/dL   Total Protein 6.8 6.5 - 8.1 g/dL   Albumin 2.7 (L) 3.5 - 5.0 g/dL   AST 38 15 - 41 U/L   ALT 19 0 - 44 U/L   Alkaline Phosphatase 39 38 - 126 U/L   Total Bilirubin 0.4 0.3 - 1.2 mg/dL   GFR calc non Af Amer 22 (L) >60 mL/min   GFR calc Af Amer 25 (L) >60 mL/min   Anion gap 12 5 - 15    Comment: Performed at [facility], [address] [place], [place] [address]  CBC with Differential/Platelet     Status: Abnormal   Collection Time: 05/13/2020  8:11 PM  Result Value Ref Range   WBC 7.5 4.0 - 10.5 K/uL   RBC 3.69 (L) 3.87 - 5.11 MIL/uL   Hemoglobin 11.8 (L) 12.0 - 15.0 g/dL   HCT 34.3 (L) 36 - 46 %   MCV 93.0 80.0 - 100.0 fL   MCH 32.0 26.0 - 34.0 pg   MCHC 34.4 30.0 - 36.0 g/dL   RDW 14.1 11.5 - 15.5 %   Platelets 324 150 - 400 K/uL   nRBC 0.0 0.0 - 0.2 %   Neutrophils Relative % 77 %   Neutro Abs 5.7 1.7 - 7.7 K/uL   Lymphocytes Relative 15 %   Lymphs Abs 1.1 0.7 - 4.0 K/uL   Monocytes Relative 8 %   Monocytes Absolute 0.6 0 - 1 K/uL   Eosinophils Relative 0 %   Eosinophils Absolute 0.0 0 - 0 K/uL   Basophils Relative 0 %   Basophils Absolute 0.0 0 - 0 K/uL   Immature Granulocytes 0 %   Abs Immature Granulocytes 0.03 0.00 - 0.07 K/uL    Comment: Performed at [facility], [address], [place], [place] [address]  Fibrinogen     Status: Abnormal   Collection Time: 05/09/2020  8:11 PM  Result Value Ref Range   Fibrinogen 787 (H) 210 - 475 mg/dL    Comment: Performed at [facility], [address] [place], [place] [address]  D-dimer, quantitative (not at [facility])      Status: Abnormal   Collection Time: 05/11/2020  8:11 PM  Result Value Ref Range   D-Dimer, Quant 0.99 (H) 0.00 - 0.50 ug/mL-FEU    Comment: (NOTE) At the manufacturer cut-off of 0.50 ug/mL FEU, this assay has been documented to exclude PE with a sensitivity and negative predictive value of 97 to 99%.  At this time, this assay has not been approved by the FDA to exclude DVT/VTE. Results should be correlated with clinical presentation. Performed at [facility], [address], [place], [place] [address]   Lactate dehydrogenase     Status: Abnormal  Collection Time: 05/13/2020  8:11 PM  Result Value Ref Range   LDH 278 (H) 98 - 192 U/L    Comment: Performed at [facility], [address], [place], [place] [address]  Procalcitonin     Status: None   Collection Time: 05/21/2020  8:11 PM  Result Value Ref Range   Procalcitonin 0.33 ng/mL    Comment:        Interpretation: PCT (Procalcitonin) <= 0.5 ng/mL: Systemic infection (sepsis) is not likely. Local bacterial infection is possible. (NOTE)       Sepsis PCT Algorithm           Lower Respiratory Tract                                      Infection PCT Algorithm    ----------------------------     ----------------------------         PCT < 0.25 ng/mL                PCT < 0.10 ng/mL          Strongly encourage             Strongly discourage   discontinuation of antibiotics    initiation of antibiotics    ----------------------------     -----------------------------       PCT 0.25 - 0.50 ng/mL            PCT 0.10 - 0.25 ng/mL               OR       >80% decrease in PCT            Discourage initiation of                                            antibiotics      Encourage discontinuation           of antibiotics    ----------------------------     -----------------------------         PCT >= 0.50 ng/mL              PCT 0.26 - 0.50 ng/mL               AND        <80% decrease in PCT             Encourage initiation of                                              antibiotics       Encourage continuation           of antibiotics    ----------------------------     -----------------------------        PCT >= 0.50 ng/mL                  PCT > 0.50 ng/mL               AND         increase in PCT                  Strongly encourage  initiation of antibiotics    Strongly encourage escalation           of antibiotics                                     -----------------------------                                           PCT <= 0.25 ng/mL                                                 OR                                        > 80% decrease in PCT                                      Discontinue / Do not initiate                                             antibiotics  Performed at [facility], [address] [place], [place] [address]   Troponin I (High Sensitivity)     Status: None   Collection Time: 05/21/2020  8:11 PM  Result Value Ref Range   Troponin I (High Sensitivity) 16 <18 ng/L    Comment: (NOTE) Elevated high sensitivity troponin I (hsTnI) values and significant  changes across serial measurements may suggest ACS but many other  chronic and acute conditions are known to elevate hsTnI results.  Refer to the "Links" section for chest pain algorithms and additional  guidance. Performed at [facility], [address], [place], [place] [address]   Ferritin     Status: Abnormal   Collection Time: 04/28/2020  8:12 PM  Result Value Ref Range   Ferritin 2,078 (H) 11 - 307 ng/mL    Comment: Performed at [facility], [address] [place], [place] [address]  Respiratory Panel by RT PCR (Flu A&B, Covid) - Nasopharyngeal Swab     Status: Abnormal   Collection Time: 04/27/2020  8:29 PM   Specimen: Nasopharyngeal Swab  Result Value Ref Range   SARS Coronavirus 2 by RT PCR POSITIVE (A) NEGATIVE    Comment: RESULT CALLED TO, READ BACK BY AND VERIFIED WITH: T TALBOT,RN@2150   05/01/2020 MKELLY    Influenza A by PCR NEGATIVE NEGATIVE   Influenza B by PCR NEGATIVE NEGATIVE    Comment: (NOTE) The Xpert Xpress SARS-CoV-2/FLU/RSV assay is intended as an aid in  the diagnosis of influenza from Nasopharyngeal swab specimens and  should not be used as a sole basis for treatment. Nasal washings and  aspirates are unacceptable for Xpert Xpress SARS-CoV-2/FLU/RSV  testing.  Fact Sheet for Patients: PinkCheek.be  Fact Sheet for Healthcare Providers: GravelBags.it  This test is not yet approved or cleared by the [place] FDA and  has been  authorized for detection and/or diagnosis of SARS-CoV-2 by  FDA under an Emergency Use Authorization (EUA). This EUA will remain  in effect (meaning this test can be used) for the duration of the  Covid-19 declaration under Section 564(b)(1) of the Act, 21  U.S.C. section 360bbb-3(b)(1), unless the authorization is  terminated or revoked. Performed at [facility], [address] [place], [place] [address]     Chemistries  Recent Labs  Lab 05/10/2020 2011  NA 126*  K 4.5  CL 95*  CO2 19*  GLUCOSE 198*  BUN 44*  CREATININE 2.24*  CALCIUM 8.5*  AST 38  ALT 19  ALKPHOS 39  BILITOT 0.4   ------------------------------------------------------------------------------------------------------------------  ------------------------------------------------------------------------------------------------------------------ GFR: Estimated Creatinine Clearance: 22.8 mL/min (A) (by C-G formula based on SCr of 2.24 mg/dL (H)). Liver Function Tests: Recent Labs  Lab 04/27/2020 2011  AST 38  ALT 19  ALKPHOS 39  BILITOT 0.4  PROT 6.8  ALBUMIN 2.7*   No results for input(s): LIPASE, AMYLASE in the last 168 hours. No results for input(s): AMMONIA in the last 168 hours. Coagulation Profile: No results for input(s): INR, PROTIME in the last 168 hours. Cardiac  Enzymes: No results for input(s): CKTOTAL, CKMB, CKMBINDEX, TROPONINI in the last 168 hours. BNP (last 3 results) No results for input(s): PROBNP in the last 8760 hours. HbA1C: No results for input(s): HGBA1C in the last 72 hours. CBG: No results for input(s): GLUCAP in the last 168 hours. Lipid Profile: No results for input(s): CHOL, HDL, LDLCALC, TRIG, CHOLHDL, LDLDIRECT in the last 72 hours. Thyroid Function Tests: No results for input(s): TSH, T4TOTAL, FREET4, T3FREE, THYROIDAB in the last 72 hours. Anemia Panel: Recent Labs    05/21/2020 2012  FERRITIN 2,078*    --------------------------------------------------------------------------------------------------------------- Urine analysis:    Component Value Date/Time   COLORURINE AMBER (A) 10/15/2018 1146   APPEARANCEUR CLOUDY (A) 10/15/2018 1146   LABSPEC 1.018 10/15/2018 1146   PHURINE 5.0 10/15/2018 1146   GLUCOSEU >=500 (A) 10/15/2018 1146   HGBUR SMALL (A) 10/15/2018 1146   BILIRUBINUR NEGATIVE 10/15/2018 1146   KETONESUR NEGATIVE 10/15/2018 1146   PROTEINUR >=300 (A) 10/15/2018 1146   NITRITE NEGATIVE 10/15/2018 1146   LEUKOCYTESUR NEGATIVE 10/15/2018 1146      Imaging Results:    DG Chest Port 1 View  Result Date: 05/15/2020 CLINICAL DATA:  Shortness of breath and cough. EXAM: PORTABLE CHEST 1 VIEW COMPARISON:  February 08, 2016 FINDINGS: Mild multifocal infiltrates are seen within the bilateral lung bases and periphery of the mid lung fields. There is no evidence of a pleural effusion or pneumothorax. The heart size and mediastinal contours are within normal limits. Radiopaque surgical clips are seen overlying the right upper quadrant. The visualized skeletal structures are unremarkable. IMPRESSION: Mild bilateral multifocal infiltrates. Electronically Signed   By: Virgina Norfolk M.D.   On: 04/28/2020 21:07    My personal review of EKG: Rhythm ST, Rate 106 /min, QTc 436 ,no Acute ST changes   Assessment & Plan:     Active Problems:   Respiratory failure with hypoxia ([place])   1. Respiratory failure with hypoxia 1. 2/2 to Covid pneumonia 2. Reported O2 sats down to 80s 3. Maintaining 97% on 10L NRB 4. Tachypnea to 34RR 5. Wean off O2 as tolerated 6. Continue treatment as below 2. Covid pneumonia 1. covid positive 2. BL pneumonia on CXR 3. LDH 278, Ferritin 2078, procal 0.33, D-Dimer 0.99, fibrinogen 787 4. Decadron and Remdesivir started in the ED 5. Continue  Inhaler q6H 6. Continue supportive care 7. Admit to stepdown 3. AKI 1. Baseline cr 1.85 2. Today 2.24 3. Pre-renal 2/2 to poor PO intake 4. Continue fluids, recheck in the AM 4. Hyponatremia 1. Corrects to 128 2. Likely 2/2 poor PO intake 3. Hold lasix 4. NS @ 64ml/hour 5. Recheck in the AM 5. Hyperglycemia in the setting of DMII 1. Continue long acting insulin daily 2. Sliding scale coverage 3. Carb modified/heart healthy diet 4. Continue to monitor 6. Hypothyroidism 1. Continue levothyroxine 7.    DVT Prophylaxis-   Heparin - SCDs   AM Labs Ordered, also please review Full Orders  Family Communication: No family at bedside Code Status:  DNI  Admission status:Inpatient :The appropriate admission status for this patient is INPATIENT. Inpatient status is judged to be reasonable and necessary in order to provide the required intensity of service to ensure the patient's safety. The patient's presenting symptoms, physical exam findings, and initial radiographic and laboratory data in the context of their chronic comorbidities is felt to place them at high risk for further clinical deterioration. Furthermore, it is not anticipated that the patient will be medically stable for discharge from the hospital within 2 midnights of admission. The following factors support the admission status of inpatient.     The patient's presenting symptoms include Dyspnea The worrisome physical exam findings include Hypoxia  The initial  radiographic and laboratory data are worrisome because of BL pneumonia The chronic co-morbidities include T2DM, obesity, HTN, HLD, OSA, GERD, hypothyroidism       * I certify that at the point of admission it is my clinical judgment that the patient will require inpatient hospital care spanning beyond 2 midnights from the point of admission due to high intensity of service, high risk for further deterioration and high frequency of surveillance required.*  Time spent in minutes : [place]

## 2020-05-22 NOTE — ED Notes (Signed)
Pt's daughter called and was yelling on the phone that the pt could not be given remdesivir due to her kidney function, I told the daughter she needed to lower her voice and she continued to yell "no I don't".  I then said "you do not need to speak to me that way" and she stated "yes I can".  The phone call was ended.

## 2020-05-22 NOTE — ED Notes (Signed)
Daughter called & updated on pt per pt approval

## 2020-05-22 NOTE — ED Triage Notes (Signed)
Pt has been sick for the past 3 weeks. She just started having SOB today. Has been running a fever and not feeling well. Pt was tested for COVID yesterday, but test results are not back 79% on RA. 10L Nonrebreather and O2 sats 90% CO2 17. BP stable. Pt did not receive her COVID vaccines. 24 G IV left wrist.

## 2020-05-23 ENCOUNTER — Other Ambulatory Visit: Payer: Self-pay

## 2020-05-23 DIAGNOSIS — J069 Acute upper respiratory infection, unspecified: Secondary | ICD-10-CM | POA: Diagnosis present

## 2020-05-23 DIAGNOSIS — U071 COVID-19: Secondary | ICD-10-CM

## 2020-05-23 DIAGNOSIS — E1121 Type 2 diabetes mellitus with diabetic nephropathy: Secondary | ICD-10-CM

## 2020-05-23 DIAGNOSIS — J9601 Acute respiratory failure with hypoxia: Secondary | ICD-10-CM | POA: Diagnosis not present

## 2020-05-23 DIAGNOSIS — J1282 Pneumonia due to coronavirus disease 2019: Secondary | ICD-10-CM

## 2020-05-23 LAB — CBC WITH DIFFERENTIAL/PLATELET
Basophils Absolute: 0 10*3/uL (ref 0.0–0.1)
Basophils Relative: 0 %
Eosinophils Absolute: 0 10*3/uL (ref 0.0–0.5)
Eosinophils Relative: 0 %
HCT: 32.8 % — ABNORMAL LOW (ref 36.0–46.0)
Hemoglobin: 11.1 g/dL — ABNORMAL LOW (ref 12.0–15.0)
Lymphocytes Relative: 8 %
Lymphs Abs: 0.4 10*3/uL — ABNORMAL LOW (ref 0.7–4.0)
MCH: 31.4 pg (ref 26.0–34.0)
MCHC: 33.8 g/dL (ref 30.0–36.0)
MCV: 92.9 fL (ref 80.0–100.0)
Monocytes Absolute: 0.1 10*3/uL (ref 0.1–1.0)
Monocytes Relative: 2 %
Myelocytes: 2 %
Neutro Abs: 4.6 10*3/uL (ref 1.7–7.7)
Neutrophils Relative %: 84 %
Other: 2 %
Platelets: 298 10*3/uL (ref 150–400)
Promyelocytes Relative: 2 %
RBC: 3.53 MIL/uL — ABNORMAL LOW (ref 3.87–5.11)
RDW: 13.9 % (ref 11.5–15.5)
WBC: 5.5 10*3/uL (ref 4.0–10.5)
nRBC: 0 % (ref 0.0–0.2)

## 2020-05-23 LAB — COMPREHENSIVE METABOLIC PANEL
ALT: 17 U/L (ref 0–44)
AST: 34 U/L (ref 15–41)
Albumin: 2.4 g/dL — ABNORMAL LOW (ref 3.5–5.0)
Alkaline Phosphatase: 34 U/L — ABNORMAL LOW (ref 38–126)
Anion gap: 11 (ref 5–15)
BUN: 41 mg/dL — ABNORMAL HIGH (ref 8–23)
CO2: 16 mmol/L — ABNORMAL LOW (ref 22–32)
Calcium: 8.1 mg/dL — ABNORMAL LOW (ref 8.9–10.3)
Chloride: 98 mmol/L (ref 98–111)
Creatinine, Ser: 2.01 mg/dL — ABNORMAL HIGH (ref 0.44–1.00)
GFR calc Af Amer: 29 mL/min — ABNORMAL LOW (ref >60)
GFR calc non Af Amer: 25 mL/min — ABNORMAL LOW (ref >60)
Glucose, Bld: 335 mg/dL — ABNORMAL HIGH (ref 70–99)
Potassium: 4.7 mmol/L (ref 3.5–5.1)
Sodium: 125 mmol/L — ABNORMAL LOW (ref 135–145)
Total Bilirubin: 0.4 mg/dL (ref 0.3–1.2)
Total Protein: 6.3 g/dL — ABNORMAL LOW (ref 6.5–8.1)

## 2020-05-23 LAB — HEMOGLOBIN A1C
Hgb A1c MFr Bld: 9.1 % — ABNORMAL HIGH (ref 4.8–5.6)
Mean Plasma Glucose: 214.47 mg/dL

## 2020-05-23 LAB — CBG MONITORING, ED
Glucose-Capillary: 385 mg/dL — ABNORMAL HIGH (ref 70–99)
Glucose-Capillary: 348 mg/dL — ABNORMAL HIGH (ref 70–99)

## 2020-05-23 LAB — GLUCOSE, CAPILLARY: Glucose-Capillary: 104 mg/dL — ABNORMAL HIGH (ref 70–99)

## 2020-05-23 LAB — PATHOLOGIST SMEAR REVIEW

## 2020-05-23 LAB — C-REACTIVE PROTEIN: CRP: 6.6 mg/dL — ABNORMAL HIGH (ref <1.0)

## 2020-05-23 LAB — FERRITIN: Ferritin: 1853 ng/mL — ABNORMAL HIGH (ref 11–307)

## 2020-05-23 LAB — MAGNESIUM: Magnesium: 2.1 mg/dL (ref 1.7–2.4)

## 2020-05-23 LAB — TSH: TSH: 3.321 u[IU]/mL (ref 0.350–4.500)

## 2020-05-23 LAB — D-DIMER, QUANTITATIVE: D-Dimer, Quant: 0.93 ug/mL-FEU — ABNORMAL HIGH (ref 0.00–0.50)

## 2020-05-23 LAB — HIV ANTIBODY (ROUTINE TESTING W REFLEX): HIV Screen 4th Generation wRfx: NONREACTIVE

## 2020-05-23 MED ORDER — ZINC SULFATE 220 (50 ZN) MG PO CAPS
220.0000 mg | ORAL_CAPSULE | Freq: Every day | ORAL | Status: DC
  Administered 2020-05-23 – 2020-05-27 (×5): 220 mg via ORAL
  Filled 2020-05-23 (×5): qty 1

## 2020-05-23 MED ORDER — CHLORHEXIDINE GLUCONATE CLOTH 2 % EX PADS
6.0000 | MEDICATED_PAD | Freq: Every day | CUTANEOUS | Status: DC
  Administered 2020-05-23 – 2020-06-13 (×23): 6 via TOPICAL

## 2020-05-23 MED ORDER — INSULIN DETEMIR 100 UNIT/ML ~~LOC~~ SOLN
20.0000 [IU] | Freq: Two times a day (BID) | SUBCUTANEOUS | Status: DC
  Administered 2020-05-23 – 2020-05-26 (×6): 20 [IU] via SUBCUTANEOUS
  Filled 2020-05-23 (×8): qty 0.2

## 2020-05-23 MED ORDER — PANTOPRAZOLE SODIUM 40 MG PO TBEC
40.0000 mg | DELAYED_RELEASE_TABLET | Freq: Every day | ORAL | Status: DC
  Administered 2020-05-23 – 2020-05-27 (×5): 40 mg via ORAL
  Filled 2020-05-23 (×4): qty 1

## 2020-05-23 MED ORDER — INSULIN DETEMIR 100 UNIT/ML ~~LOC~~ SOLN
10.0000 [IU] | Freq: Two times a day (BID) | SUBCUTANEOUS | Status: DC
  Filled 2020-05-23 (×6): qty 0.1

## 2020-05-23 MED ORDER — ASCORBIC ACID 500 MG PO TABS
500.0000 mg | ORAL_TABLET | Freq: Every day | ORAL | Status: DC
  Administered 2020-05-23 – 2020-05-27 (×5): 500 mg via ORAL
  Filled 2020-05-23 (×5): qty 1

## 2020-05-23 MED ORDER — METHYLPREDNISOLONE SODIUM SUCC 40 MG IJ SOLR
40.0000 mg | Freq: Two times a day (BID) | INTRAMUSCULAR | Status: AC
  Administered 2020-05-23 – 2020-06-01 (×19): 40 mg via INTRAVENOUS
  Filled 2020-05-23 (×19): qty 1

## 2020-05-23 NOTE — ED Notes (Signed)
pts daughter arrived to ED to collect pts belongings, this RN placed three rings & pts dress she arrived in a pt belonging bag. Cherlyn Roberts took belongings to daughter.

## 2020-05-23 NOTE — ED Notes (Signed)
Pt O2 sat ranging 80-84%. Respiratory notified and to come see pt.

## 2020-05-23 NOTE — ED Notes (Signed)
Respiratory called and informed Dr Denton Brick wants pt switched from NR to High Flow O2, verbalized understanding.

## 2020-05-23 NOTE — Progress Notes (Signed)
Inpatient Diabetes Program Recommendations  AACE/ADA: New Consensus Statement on Inpatient Glycemic Control   Target Ranges:  Prepandial:   less than 140 mg/dL      Peak postprandial:   less than 180 mg/dL (1-2 hours)      Critically ill patients:  140 - 180 mg/dL   Results for CHRISANN, MELARAGNO (MRN [id number]) as of 05/23/2020 09:40  Ref. Range 05/15/2020 23:20 05/23/2020 08:25  Glucose-Capillary Latest Ref Range: 70 - 99 mg/dL 172 (H) 385 (H)   Review of Glycemic Control  Diabetes history: DM2 Outpatient Diabetes medications: Levemir 30 units QAM, Leveimr 35 units QPM, Novolog 3-11 units TID with meals Current orders for Inpatient glycemic control: Levemir 10 units daily, Novolog 0-20 units TID with meals, Novolog 0-5 units QHS; Decadron 6 mg Q24H  Inpatient Diabetes Program Recommendations:    Insulin: If steroids are continued as ordered, please consider increasing Levemir to 10 units BID and ordering Novolog 4 units TID with meals for meal coverage if patient eats at least 50% of meals.  Thanks, Barnie Alderman, RN, MSN, CDE Diabetes Coordinator Inpatient Diabetes Program [phone] (Team Pager from 8am to 5pm)

## 2020-05-23 NOTE — Progress Notes (Signed)
Patient Demographics:    [name], is a 69 y.o. female, DOB - [birth year], GNF:[id number]  Admit date - 05/21/2020   Admitting Physician Rolla Plate, DO  Outpatient Primary MD for the patient is Sharilyn Sites, MD  LOS - 1   Chief Complaint  Patient presents with  . Shortness of Breath        Subjective:    Carvel Getting today has no fevers, no emesis,  No chest pain,   --Shortness of breath is worsening hypoxia is worsening patient currently requiring 15 L of oxygen -Cough persist  Assessment  & Plan :    Active Problems:   Respiratory failure with hypoxia (HCC)   1)Acute hypoxic respiratory failure secondary to COVID-19 infection/Pneumonia--- The treatment plan and use of medications  for treatment of COVID-19 infection and possible side effects were discussed with patient and daughter -----Patient and daughter verbalizes understanding and agrees to treatment protocols  -severe dyspnea and severe hypoxia persist requiring 15 L of oxygen --Patient is positive for COVID-19 infection, chest x-ray with findings of infiltrates/opacities,  patient is tachypneic/hypoxic and requiring continuous supplemental oxygen---patient meets criteria for initiation of Remdesivir AND Steroid therapy per protocol  --Check and trend inflammatory markers including D-dimer, ferritin and  CRP---also follow CBC and CMP --Supplemental oxygen to keep O2 sats above 93% -Follow serial chest x-rays and ABGs as indicated --- Encourage prone positioning for More than 16 hours/day in increments of 2 to 3 hours at a time if able to tolerate --Attempt to maintain euvolemic state --Zinc and vitamin C as ordered -Albuterol inhaler as needed -Accu-Cheks/fingersticks while on high-dose steroids -PPI while on high-dose steroids --Pt and daughter Declines Remdesevir--  -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 and hydroxychloroquine -I explained to patient and her daughter or registered nurses that neither ivermectin nor hydroxychloroquine are part of the standard approved treatment protocol for COVID-19 infections COVID-19 Labs  Recent Labs    05/21/2020 2011 05/10/2020 2012 05/23/20 0540  DDIMER 0.99*  --  0.93*  FERRITIN  --  2,078* 1,853*  LDH 278*  --   --   CRP  --   --  6.6*    Lab Results  Component Value Date   SARSCOV2NAA POSITIVE (A) 04/26/2020    2)CKD IV--renal function appears close to baseline, creatinine was 2.0 on 10/18/2018, admission creatinine was 2.24, repeat creatinine on 05/23/2020 is 2.0 -Avoid over aggressive hydration in the setting of Covid infection creatinine on admission=2.24  , , creatinine is now=2.0 -Hold Lasix --- renally adjust medications, avoid nephrotoxic agents / dehydration  / hypotension   3) hyponatremia--suspect dehydration related, Lasix is on hold, monitor closely  4)DM2-anticipate worsening glycemic control due to high-dose steroids, -Increase Levemir 20 units twice daily along with sliding scale coverage  5)HTN--start metoprolol 100 mg daily along with Cardizem CD 240 mg nightly  6) hypothyroidism--- continue levothyroxine 175 mcg daily  7)HLD-continue Crestor  8)Obesity/OSA--PTA patient used CPAP, resume CPAP nightly while in the hospital for OSA rather than Covid respiratory distress  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, patient requires DNI, but not DNR status-- ---she would like CPR if needed but no intubation  Disposition/Need for in-Hospital Stay- patient unable  to be discharged at this time due to --acute hypoxic respiratory failure secondary to Covid pneumonia requiring IV steroids, supplemental oxygen*  Status is: Inpatient  Remains inpatient appropriate because:acute hypoxic respiratory failure secondary to Covid pneumonia requiring IV steroids, supplemental  oxygen*   Disposition: The patient is from: Home              Anticipated d/c is to: TBD              Anticipated d/c date is: > 3 days              Patient currently is not medically stable to d/c. Barriers: Not Clinically Stable- acute hypoxic respiratory failure secondary to Covid pneumonia requiring IV steroids, supplemental oxygen*  Code Status : Partial (--she would like CPR if needed but no intubation)  Family Communication:    (patient is alert, awake and coherent)  Discussed with pts daughter [name]  Consults  :  na  DVT Prophylaxis  :  - Heparin - SCDs   Lab Results  Component Value Date   PLT 298 05/23/2020    Inpatient Medications  Scheduled Meds: . aspirin EC  81 mg Oral Daily  . dexamethasone (DECADRON) injection  6 mg Intravenous Q24H  . diltiazem  240 mg Oral QHS  . fenofibrate  160 mg Oral Daily  . heparin  5,000 Units Subcutaneous Q8H  . insulin aspart  0-20 Units Subcutaneous TID WC  . insulin aspart  0-5 Units Subcutaneous QHS  . insulin detemir  10 Units Subcutaneous BID  . Ipratropium-Albuterol  1 puff Inhalation Q6H  . levothyroxine  175 mcg Oral Daily  . metoprolol succinate  100 mg Oral Daily  . multivitamin with minerals  1 tablet Oral Daily  . rOPINIRole  1 mg Oral QHS  . rosuvastatin  40 mg Oral QHS   Continuous Infusions: . sodium chloride 75 mL/hr at 05/23/20 0836   PRN Meds:.acetaminophen, chlorpheniramine-HYDROcodone, guaiFENesin-dextromethorphan, ondansetron **OR** ondansetron (ZOFRAN) IV, oxyCODONE, polyethylene glycol    Anti-infectives (From admission, onward)   Start     Dose/Rate Route Frequency Ordered Stop   05/23/20 1800  remdesivir 100 mg in sodium chloride 0.9 % 100 mL IVPB  Status:  Discontinued       "Followed by" Linked Group Details   100 mg 200 mL/hr over 30 Minutes Intravenous Daily 05/16/2020 2234 05/23/20 1000   05/19/2020 2300  remdesivir 100 mg in sodium chloride 0.9 % 100 mL IVPB       "Followed by" Linked  Group Details   100 mg 200 mL/hr over 30 Minutes Intravenous Every 30 min 05/17/2020 2234 05/09/2020 2359        Objective:   Vitals:   05/23/20 1415 05/23/20 1515 05/23/20 1530 05/23/20 1925  BP:   (!) 90/50   Pulse:   67   Resp:   (!) 32   Temp:      TempSrc:      SpO2: 93% 91% (!) 89% (!) 89%  Weight:      Height:        Wt Readings from Last 3 Encounters:  05/19/2020 78.9 kg  07/25/19 76.5 kg  01/08/19 68 kg    No intake or output data in the 24 hours ending 05/23/20 1941   Physical Exam  Gen:- Awake Alert, ill-appearing, obese HEENT:- [place].AT, No sclera icterus Nose- HFNC 15 L Neck-Supple Neck,No JVD,.  Lungs-diminished breath sounds with scattered rhonchi  CV- S1, S2 normal, regular  Abd-  +  ve B.Sounds, Abd Soft, No tenderness,    Extremity/Skin:- No  edema, pedal pulses present  Psych-affect is appropriate, oriented x3 Neuro-generalized weakness, no new focal deficits, no tremors   Data Review:   Micro Results Recent Results (from the past 240 hour(s))  Respiratory Panel by RT PCR (Flu A&B, Covid) - Nasopharyngeal Swab     Status: Abnormal   Collection Time: 05/04/2020  8:29 PM   Specimen: Nasopharyngeal Swab  Result Value Ref Range Status   SARS Coronavirus 2 by RT PCR POSITIVE (A) NEGATIVE Final    Comment: RESULT CALLED TO, READ BACK BY AND VERIFIED WITH: T TALBOT,RN@2150  05/21/2020 MKELLY    Influenza A by PCR NEGATIVE NEGATIVE Final   Influenza B by PCR NEGATIVE NEGATIVE Final    Comment: (NOTE) The Xpert Xpress SARS-CoV-2/FLU/RSV assay is intended as an aid in  the diagnosis of influenza from Nasopharyngeal swab specimens and  should not be used as a sole basis for treatment. Nasal washings and  aspirates are unacceptable for Xpert Xpress SARS-CoV-2/FLU/RSV  testing.  Fact Sheet for Patients: PinkCheek.be  Fact Sheet for Healthcare Providers: GravelBags.it  This test is not yet approved or  cleared by the [place] FDA and  has been authorized for detection and/or diagnosis of SARS-CoV-2 by  FDA under an Emergency Use Authorization (EUA). This EUA will remain  in effect (meaning this test can be used) for the duration of the  Covid-19 declaration under Section 564(b)(1) of the Act, 21  U.S.C. section 360bbb-3(b)(1), unless the authorization is  terminated or revoked. Performed at [facility], [address] [place], [place] [address]     Radiology Reports DG Chest [place] 1 View  Result Date: 05/06/2020 CLINICAL DATA:  Shortness of breath and cough. EXAM: PORTABLE CHEST 1 VIEW COMPARISON:  February 08, 2016 FINDINGS: Mild multifocal infiltrates are seen within the bilateral lung bases and periphery of the mid lung fields. There is no evidence of a pleural effusion or pneumothorax. The heart size and mediastinal contours are within normal limits. Radiopaque surgical clips are seen overlying the right upper quadrant. The visualized skeletal structures are unremarkable. IMPRESSION: Mild bilateral multifocal infiltrates. Electronically Signed   By: Virgina Norfolk M.D.   On: 05/06/2020 21:07     CBC Recent Labs  Lab 05/15/2020 2011 05/23/20 0540  WBC 7.5 5.5  HGB 11.8* 11.1*  HCT 34.3* 32.8*  PLT 324 298  MCV 93.0 92.9  MCH 32.0 31.4  MCHC 34.4 33.8  RDW 14.1 13.9  LYMPHSABS 1.1 0.4*  MONOABS 0.6 0.1  EOSABS 0.0 0.0  BASOSABS 0.0 0.0    Chemistries  Recent Labs  Lab 05/20/2020 2011 05/23/20 0540  NA 126* 125*  K 4.5 4.7  CL 95* 98  CO2 19* 16*  GLUCOSE 198* 335*  BUN 44* 41*  CREATININE 2.24* 2.01*  CALCIUM 8.5* 8.1*  MG  --  2.1  AST 38 34  ALT 19 17  ALKPHOS 39 34*  BILITOT 0.4 0.4   ------------------------------------------------------------------------------------------------------------------ No results for input(s): CHOL, HDL, LDLCALC, TRIG, CHOLHDL, LDLDIRECT in the last 72 hours.  Lab Results  Component Value Date   HGBA1C 9.1 (H) 05/19/2020    ------------------------------------------------------------------------------------------------------------------ No results for input(s): TSH, T4TOTAL, T3FREE, THYROIDAB in the last 72 hours.  Invalid input(s): FREET3 ------------------------------------------------------------------------------------------------------------------ Recent Labs    05/18/2020 2012 05/23/20 0540  FERRITIN 2,078* 1,853*    Coagulation profile No results for input(s): INR, PROTIME in the last 168 hours.  Recent Labs  05/21/2020 2011 05/23/20 0540  DDIMER 0.99* 0.93*    Cardiac Enzymes No results for input(s): CKMB, TROPONINI, MYOGLOBIN in the last 168 hours.  Invalid input(s): CK ------------------------------------------------------------------------------------------------------------------ No results found for: BNP   Roxan Hockey M.D on 05/23/2020 at 7:41 PM  Go to [email or web address] - for contact info  [facility] - Office  [phone]

## 2020-05-23 NOTE — ED Notes (Addendum)
pts daughter requested that this RN update her on pts COVID test, upon positive result, this RN contacted daughter (per pt approval), pts daughter got upset about medications ordered for pt, pts daughter stated not to giver her mother the medication, this RN stated that it was the pts choice yet I would speak with the MD so the patient has complete education on med, pts daughter continued to yell, requesting for the supervisor, [name], [facility] made aware of daughters concerns & agitation. pts daughter then connected to supervisor

## 2020-05-23 DEATH — deceased

## 2020-05-24 ENCOUNTER — Encounter (HOSPITAL_COMMUNITY): Payer: Self-pay | Admitting: Family Medicine

## 2020-05-24 DIAGNOSIS — G4733 Obstructive sleep apnea (adult) (pediatric): Secondary | ICD-10-CM | POA: Diagnosis not present

## 2020-05-24 DIAGNOSIS — J9601 Acute respiratory failure with hypoxia: Secondary | ICD-10-CM | POA: Diagnosis not present

## 2020-05-24 DIAGNOSIS — U071 COVID-19: Secondary | ICD-10-CM | POA: Diagnosis not present

## 2020-05-24 DIAGNOSIS — E1121 Type 2 diabetes mellitus with diabetic nephropathy: Secondary | ICD-10-CM | POA: Diagnosis not present

## 2020-05-24 LAB — CBC WITH DIFFERENTIAL/PLATELET
Abs Immature Granulocytes: 0.04 10*3/uL (ref 0.00–0.07)
Basophils Absolute: 0 10*3/uL (ref 0.0–0.1)
Basophils Relative: 0 %
Eosinophils Absolute: 0 10*3/uL (ref 0.0–0.5)
Eosinophils Relative: 0 %
HCT: 28.8 % — ABNORMAL LOW (ref 36.0–46.0)
Hemoglobin: 9.9 g/dL — ABNORMAL LOW (ref 12.0–15.0)
Immature Granulocytes: 1 %
Lymphocytes Relative: 14 %
Lymphs Abs: 1.1 10*3/uL (ref 0.7–4.0)
MCH: 32.1 pg (ref 26.0–34.0)
MCHC: 34.4 g/dL (ref 30.0–36.0)
MCV: 93.5 fL (ref 80.0–100.0)
Monocytes Absolute: 0.3 10*3/uL (ref 0.1–1.0)
Monocytes Relative: 4 %
Neutro Abs: 6.2 10*3/uL (ref 1.7–7.7)
Neutrophils Relative %: 81 %
Platelets: 300 10*3/uL (ref 150–400)
RBC: 3.08 MIL/uL — ABNORMAL LOW (ref 3.87–5.11)
RDW: 14.2 % (ref 11.5–15.5)
WBC: 7.6 10*3/uL (ref 4.0–10.5)
nRBC: 0 % (ref 0.0–0.2)

## 2020-05-24 LAB — MAGNESIUM: Magnesium: 2.4 mg/dL (ref 1.7–2.4)

## 2020-05-24 LAB — GLUCOSE, CAPILLARY
Glucose-Capillary: 264 mg/dL — ABNORMAL HIGH (ref 70–99)
Glucose-Capillary: 281 mg/dL — ABNORMAL HIGH (ref 70–99)
Glucose-Capillary: 126 mg/dL — ABNORMAL HIGH (ref 70–99)
Glucose-Capillary: 127 mg/dL — ABNORMAL HIGH (ref 70–99)

## 2020-05-24 LAB — COMPREHENSIVE METABOLIC PANEL
ALT: 14 U/L (ref 0–44)
AST: 32 U/L (ref 15–41)
Albumin: 2.2 g/dL — ABNORMAL LOW (ref 3.5–5.0)
Alkaline Phosphatase: 34 U/L — ABNORMAL LOW (ref 38–126)
Anion gap: 10 (ref 5–15)
BUN: 49 mg/dL — ABNORMAL HIGH (ref 8–23)
CO2: 17 mmol/L — ABNORMAL LOW (ref 22–32)
Calcium: 8.3 mg/dL — ABNORMAL LOW (ref 8.9–10.3)
Chloride: 104 mmol/L (ref 98–111)
Creatinine, Ser: 1.86 mg/dL — ABNORMAL HIGH (ref 0.44–1.00)
GFR calc Af Amer: 32 mL/min — ABNORMAL LOW (ref >60)
GFR calc non Af Amer: 27 mL/min — ABNORMAL LOW (ref >60)
Glucose, Bld: 210 mg/dL — ABNORMAL HIGH (ref 70–99)
Potassium: 5 mmol/L (ref 3.5–5.1)
Sodium: 131 mmol/L — ABNORMAL LOW (ref 135–145)
Total Bilirubin: 0.4 mg/dL (ref 0.3–1.2)
Total Protein: 5.7 g/dL — ABNORMAL LOW (ref 6.5–8.1)

## 2020-05-24 LAB — FERRITIN: Ferritin: 1569 ng/mL — ABNORMAL HIGH (ref 11–307)

## 2020-05-24 LAB — MRSA PCR SCREENING: MRSA by PCR: NEGATIVE

## 2020-05-24 LAB — D-DIMER, QUANTITATIVE: D-Dimer, Quant: 0.78 ug/mL-FEU — ABNORMAL HIGH (ref 0.00–0.50)

## 2020-05-24 LAB — C-REACTIVE PROTEIN: CRP: 3.7 mg/dL — ABNORMAL HIGH (ref <1.0)

## 2020-05-24 MED ORDER — INSULIN ASPART 100 UNIT/ML ~~LOC~~ SOLN
3.0000 [IU] | Freq: Three times a day (TID) | SUBCUTANEOUS | Status: DC
  Administered 2020-05-25 – 2020-05-26 (×3): 3 [IU] via SUBCUTANEOUS

## 2020-05-24 NOTE — Progress Notes (Signed)
Patient Demographics:    [name], is a 69 y.o. female, DOB - 10-27-1950, AXK:[id number]  Admit date - 05/18/2020   Admitting Physician Rolla Plate, DO  Outpatient Primary MD for the patient is Sharilyn Sites, MD  LOS - 2   Chief Complaint  Patient presents with  . Shortness of Breath        Subjective:    Carvel Getting still feels very short of breath, on nonrebreather mask at 15 L high flow nasal cannula.  She is coughing.  Breathing is easier when she lays on her side.  She says she is unable to prone.  Assessment  & Plan :    Principal Problem:   Respiratory failure with hypoxia ([place]) Active Problems:   HTN (hypertension)   OSA (obstructive sleep apnea)   DM2 (diabetes mellitus, type 2) (HCC)   Acute respiratory disease due to COVID-19 virus   Pneumonia due to COVID-19 virus   1)Acute hypoxic respiratory failure secondary to COVID-19 infection/Pneumonia--- The treatment plan and use of medications  for treatment of COVID-19 infection and possible side effects were discussed with patient and daughter -----Patient and daughter verbalizes understanding and agrees to treatment protocols  -severe dyspnea and severe hypoxia persist requiring 15 L of oxygen --Patient is positive for COVID-19 infection, chest x-ray with findings of infiltrates/opacities,  patient is tachypneic/hypoxic and requiring continuous supplemental oxygen---patient meets criteria for initiation of Remdesivir AND Steroid therapy per protocol  --Check and trend inflammatory markers including D-dimer, ferritin and  CRP---also follow CBC and CMP --Supplemental oxygen to keep O2 sats above 93% -Follow serial chest x-rays and ABGs as indicated --- Encourage prone positioning for More than 16 hours/day in increments of 2 to 3 hours at a time if able to tolerate --Attempt to maintain euvolemic state --Zinc and vitamin C as  ordered -Albuterol inhaler as needed -Accu-Cheks/fingersticks while on high-dose steroids -PPI while on high-dose steroids --Pt and daughter do not want to receive remdesivir -I discussed the possibility of baracitinib, explained risks and benefits -Patient and daughter want to research this medication and discuss further before agreeing to take it -We will check procalcitonin  COVID-19 Labs  Recent Labs    05/17/2020 2011 04/28/2020 2012 05/23/20 0540 05/24/20 0446 05/24/20 0448  DDIMER 0.99*  --  0.93* 0.78*  --   FERRITIN  --  2,078* 1,853*  --  1,569*  LDH 278*  --   --   --   --   CRP  --   --  6.6*  --  3.7*    Lab Results  Component Value Date   SARSCOV2NAA POSITIVE (A) 04/25/2020    2)CKD IV--renal function appears close to baseline, creatinine was 2.0 on 10/18/2018, admission creatinine was 2.24, repeat creatinine on 05/23/2020 is 2.0 -Avoid over aggressive hydration in the setting of Covid infection creatinine on admission=2.24  , , creatinine is now=1.8 -Hold Lasix --- renally adjust medications, avoid nephrotoxic agents / dehydration  / hypotension   3) hyponatremia--suspect dehydration related, Lasix is on hold, monitor closely, currently improved  4)DM2-anticipate worsening glycemic control due to high-dose steroids, -Increase Levemir 20 units twice daily along with sliding scale coverage -  5)HTN--currently on metoprolol.  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-- ---she would like CPR if needed but no intubation  Disposition/Need for in-Hospital Stay- patient unable to be discharged at this time due to --acute hypoxic respiratory  failure secondary to Covid pneumonia requiring IV steroids, supplemental oxygen*  Status is: Inpatient  Remains inpatient appropriate because:acute hypoxic respiratory failure secondary to Covid pneumonia requiring IV steroids, supplemental oxygen*   Disposition: The patient is from: Home              Anticipated d/c is to: TBD              Anticipated d/c date is: > 3 days              Patient currently is not medically stable to d/c. Barriers: Not Clinically Stable- acute hypoxic respiratory failure secondary to Covid pneumonia requiring IV steroids, supplemental oxygen*  Code Status : Partial (--she would like CPR if needed but no intubation)  Family Communication:    (patient is alert, awake and coherent)  Discussed with pts daughter [name] 10/2  Consults  :  na  DVT Prophylaxis  :  - Heparin - SCDs   Lab Results  Component Value Date   PLT 300 05/24/2020    Inpatient Medications  Scheduled Meds: . vitamin C  500 mg Oral Daily  . aspirin EC  81 mg Oral Daily  . Chlorhexidine Gluconate Cloth  6 each Topical Daily  . diltiazem  240 mg Oral QHS  . fenofibrate  160 mg Oral Daily  . heparin  5,000 Units Subcutaneous Q8H  . insulin aspart  0-20 Units Subcutaneous TID WC  . insulin aspart  0-5 Units Subcutaneous QHS  . insulin detemir  20 Units Subcutaneous BID  . Ipratropium-Albuterol  1 puff Inhalation Q6H  . levothyroxine  175 mcg Oral Daily  . methylPREDNISolone (SOLU-MEDROL) injection  40 mg Intravenous Q12H  . metoprolol succinate  100 mg Oral Daily  . multivitamin with minerals  1 tablet Oral Daily  . pantoprazole  40 mg Oral Daily  . rOPINIRole  1 mg Oral QHS  . rosuvastatin  40 mg Oral QHS  . zinc sulfate  220 mg Oral Daily   Continuous Infusions: . sodium chloride Stopped (05/24/20 1741)   PRN Meds:.acetaminophen, chlorpheniramine-HYDROcodone, guaiFENesin-dextromethorphan, ondansetron **OR** ondansetron (ZOFRAN) IV, oxyCODONE, polyethylene  glycol    Anti-infectives (From admission, onward)   Start     Dose/Rate Route Frequency Ordered Stop   05/23/20 1800  remdesivir 100 mg in sodium chloride 0.9 % 100 mL IVPB  Status:  Discontinued       "Followed by" Linked Group Details   100 mg 200 mL/hr over 30 Minutes Intravenous Daily 05/09/2020 2234 05/23/20 1000   04/26/2020 2300  remdesivir 100 mg in sodium chloride 0.9 % 100 mL IVPB       "Followed by" Linked Group Details   100 mg 200 mL/hr over 30 Minutes Intravenous Every 30 min 05/09/2020 2234 05/13/2020 2359        Objective:   Vitals:   05/24/20 1600 05/24/20 1700 05/24/20 1800 05/24/20 1802  BP: (!) 82/35 (!) 111/46  (!) 98/38  Pulse: (!) 56 67 69 72  Resp: (!) 29 17 19  (!) 23  Temp:  TempSrc:      SpO2: 96% (!) 84% 90% (!) 85%  Weight:      Height:        Wt Readings from Last 3 Encounters:  05/24/20 70.3 kg  07/25/19 76.5 kg  01/08/19 68 kg     Intake/Output Summary (Last 24 hours) at 05/24/2020 1909 Last data filed at 05/24/2020 1800 Gross per 24 hour  Intake 1053.4 ml  Output --  Net 1053.4 ml     Physical Exam  General exam: Alert, awake, oriented x 3 Respiratory system: Coarse breath sounds bilaterally.  Mild increased respiratory effort. Cardiovascular system:RRR. No murmurs, rubs, gallops. Gastrointestinal system: Abdomen is nondistended, soft and nontender. No organomegaly or masses felt. Normal bowel sounds heard. Central nervous system: Alert and oriented. No focal neurological deficits. Extremities: No C/C/E, +pedal pulses Skin: No rashes, lesions or ulcers Psychiatry: Judgement and insight appear normal. Mood & affect appropriate.     Data Review:   Micro Results Recent Results (from the past 240 hour(s))  Respiratory Panel by RT PCR (Flu A&B, Covid) - Nasopharyngeal Swab     Status: Abnormal   Collection Time: 05/21/2020  8:29 PM   Specimen: Nasopharyngeal Swab  Result Value Ref Range Status   SARS Coronavirus 2 by RT PCR  POSITIVE (A) NEGATIVE Final    Comment: RESULT CALLED TO, READ BACK BY AND VERIFIED WITH: T TALBOT,RN@2150  05/17/2020 MKELLY    Influenza A by PCR NEGATIVE NEGATIVE Final   Influenza B by PCR NEGATIVE NEGATIVE Final    Comment: (NOTE) The Xpert Xpress SARS-CoV-2/FLU/RSV assay is intended as an aid in  the diagnosis of influenza from Nasopharyngeal swab specimens and  should not be used as a sole basis for treatment. Nasal washings and  aspirates are unacceptable for Xpert Xpress SARS-CoV-2/FLU/RSV  testing.  Fact Sheet for Patients: PinkCheek.be  Fact Sheet for Healthcare Providers: GravelBags.it  This test is not yet approved or cleared by the [place] FDA and  has been authorized for detection and/or diagnosis of SARS-CoV-2 by  FDA under an Emergency Use Authorization (EUA). This EUA will remain  in effect (meaning this test can be used) for the duration of the  Covid-19 declaration under Section 564(b)(1) of the Act, 21  U.S.C. section 360bbb-3(b)(1), unless the authorization is  terminated or revoked. Performed at [facility], [address], [place], [place] [address]   MRSA PCR Screening     Status: None   Collection Time: 05/23/20 10:23 PM   Specimen: Nasopharyngeal  Result Value Ref Range Status   MRSA by PCR NEGATIVE NEGATIVE Final    Comment:        The GeneXpert MRSA Assay (FDA approved for NASAL specimens only), is one component of a comprehensive MRSA colonization surveillance program. It is not intended to diagnose MRSA infection nor to guide or monitor treatment for MRSA infections. Performed at [facility], [address] [place], [place] [address]     Radiology Reports DG Chest [place] 1 View  Result Date: 04/29/2020 CLINICAL DATA:  Shortness of breath and cough. EXAM: PORTABLE CHEST 1 VIEW COMPARISON:  February 08, 2016 FINDINGS: Mild multifocal infiltrates are seen within the bilateral lung  bases and periphery of the mid lung fields. There is no evidence of a pleural effusion or pneumothorax. The heart size and mediastinal contours are within normal limits. Radiopaque surgical clips are seen overlying the right upper quadrant. The visualized skeletal structures are unremarkable. IMPRESSION: Mild bilateral multifocal infiltrates. Electronically Signed  By: Virgina Norfolk M.D.   On: 05/03/2020 21:07     CBC Recent Labs  Lab 04/23/2020 2011 05/23/20 0540 05/24/20 0446  WBC 7.5 5.5 7.6  HGB 11.8* 11.1* 9.9*  HCT 34.3* 32.8* 28.8*  PLT 324 298 300  MCV 93.0 92.9 93.5  MCH 32.0 31.4 32.1  MCHC 34.4 33.8 34.4  RDW 14.1 13.9 14.2  LYMPHSABS 1.1 0.4* 1.1  MONOABS 0.6 0.1 0.3  EOSABS 0.0 0.0 0.0  BASOSABS 0.0 0.0 0.0    Chemistries  Recent Labs  Lab 04/27/2020 2011 05/23/20 0540 05/24/20 0446  NA 126* 125* 131*  K 4.5 4.7 5.0  CL 95* 98 104  CO2 19* 16* 17*  GLUCOSE 198* 335* 210*  BUN 44* 41* 49*  CREATININE 2.24* 2.01* 1.86*  CALCIUM 8.5* 8.1* 8.3*  MG  --  2.1 2.4  AST 38 34 32  ALT 19 17 14   ALKPHOS 39 34* 34*  BILITOT 0.4 0.4 0.4   ------------------------------------------------------------------------------------------------------------------ No results for input(s): CHOL, HDL, LDLCALC, TRIG, CHOLHDL, LDLDIRECT in the last 72 hours.  Lab Results  Component Value Date   HGBA1C 9.1 (H) 05/18/2020   ------------------------------------------------------------------------------------------------------------------ Recent Labs    05/23/20 0540  TSH 3.321   ------------------------------------------------------------------------------------------------------------------ Recent Labs    05/23/20 0540 05/24/20 0448  FERRITIN 1,853* 1,569*    Coagulation profile No results for input(s): INR, PROTIME in the last 168 hours.  Recent Labs    05/23/20 0540 05/24/20 0446  DDIMER 0.93* 0.78*    Cardiac Enzymes No results for input(s): CKMB,  TROPONINI, MYOGLOBIN in the last 168 hours.  Invalid input(s): CK ------------------------------------------------------------------------------------------------------------------ No results found for: BNP   Kathie Dike M.D on 05/24/2020 at 7:09 PM  Go to [email or web address] - for contact info  [facility] - Office  [phone]

## 2020-05-24 NOTE — Progress Notes (Signed)
Tried CPAP 11 through vent, never seem to work correctly. Replaced with V60 BiPAP  14/8 with rate of 10 , FiO2 90 which seem to make patient comfortable. Patient normally wears CPAP 11.

## 2020-05-25 ENCOUNTER — Encounter (HOSPITAL_COMMUNITY): Payer: Self-pay | Admitting: Family Medicine

## 2020-05-25 DIAGNOSIS — E1121 Type 2 diabetes mellitus with diabetic nephropathy: Secondary | ICD-10-CM | POA: Diagnosis not present

## 2020-05-25 DIAGNOSIS — U071 COVID-19: Secondary | ICD-10-CM | POA: Diagnosis not present

## 2020-05-25 DIAGNOSIS — G4733 Obstructive sleep apnea (adult) (pediatric): Secondary | ICD-10-CM | POA: Diagnosis not present

## 2020-05-25 DIAGNOSIS — J9601 Acute respiratory failure with hypoxia: Secondary | ICD-10-CM | POA: Diagnosis not present

## 2020-05-25 LAB — COMPREHENSIVE METABOLIC PANEL
ALT: 14 U/L (ref 0–44)
AST: 35 U/L (ref 15–41)
Albumin: 2.2 g/dL — ABNORMAL LOW (ref 3.5–5.0)
Alkaline Phosphatase: 38 U/L (ref 38–126)
Anion gap: 10 (ref 5–15)
BUN: 54 mg/dL — ABNORMAL HIGH (ref 8–23)
CO2: 18 mmol/L — ABNORMAL LOW (ref 22–32)
Calcium: 8.7 mg/dL — ABNORMAL LOW (ref 8.9–10.3)
Chloride: 107 mmol/L (ref 98–111)
Creatinine, Ser: 1.79 mg/dL — ABNORMAL HIGH (ref 0.44–1.00)
GFR calc Af Amer: 33 mL/min — ABNORMAL LOW (ref >60)
GFR calc non Af Amer: 29 mL/min — ABNORMAL LOW (ref >60)
Glucose, Bld: 108 mg/dL — ABNORMAL HIGH (ref 70–99)
Potassium: 4.9 mmol/L (ref 3.5–5.1)
Sodium: 135 mmol/L (ref 135–145)
Total Bilirubin: 0.3 mg/dL (ref 0.3–1.2)
Total Protein: 6.1 g/dL — ABNORMAL LOW (ref 6.5–8.1)

## 2020-05-25 LAB — MAGNESIUM: Magnesium: 2.5 mg/dL — ABNORMAL HIGH (ref 1.7–2.4)

## 2020-05-25 LAB — CBC WITH DIFFERENTIAL/PLATELET
Abs Immature Granulocytes: 0.08 10*3/uL — ABNORMAL HIGH (ref 0.00–0.07)
Basophils Absolute: 0 10*3/uL (ref 0.0–0.1)
Basophils Relative: 0 %
Eosinophils Absolute: 0 10*3/uL (ref 0.0–0.5)
Eosinophils Relative: 0 %
HCT: 29.4 % — ABNORMAL LOW (ref 36.0–46.0)
Hemoglobin: 9.9 g/dL — ABNORMAL LOW (ref 12.0–15.0)
Immature Granulocytes: 1 %
Lymphocytes Relative: 10 %
Lymphs Abs: 1.1 10*3/uL (ref 0.7–4.0)
MCH: 31.2 pg (ref 26.0–34.0)
MCHC: 33.7 g/dL (ref 30.0–36.0)
MCV: 92.7 fL (ref 80.0–100.0)
Monocytes Absolute: 0.4 10*3/uL (ref 0.1–1.0)
Monocytes Relative: 4 %
Neutro Abs: 9.3 10*3/uL — ABNORMAL HIGH (ref 1.7–7.7)
Neutrophils Relative %: 85 %
Platelets: 322 10*3/uL (ref 150–400)
RBC: 3.17 MIL/uL — ABNORMAL LOW (ref 3.87–5.11)
RDW: 14 % (ref 11.5–15.5)
WBC: 10.9 10*3/uL — ABNORMAL HIGH (ref 4.0–10.5)
nRBC: 0 % (ref 0.0–0.2)

## 2020-05-25 LAB — D-DIMER, QUANTITATIVE: D-Dimer, Quant: 0.61 ug/mL-FEU — ABNORMAL HIGH (ref 0.00–0.50)

## 2020-05-25 LAB — GLUCOSE, CAPILLARY
Glucose-Capillary: 126 mg/dL — ABNORMAL HIGH (ref 70–99)
Glucose-Capillary: 131 mg/dL — ABNORMAL HIGH (ref 70–99)
Glucose-Capillary: 69 mg/dL — ABNORMAL LOW (ref 70–99)
Glucose-Capillary: 87 mg/dL (ref 70–99)

## 2020-05-25 LAB — C-REACTIVE PROTEIN: CRP: 3.1 mg/dL — ABNORMAL HIGH (ref <1.0)

## 2020-05-25 LAB — FERRITIN: Ferritin: 1528 ng/mL — ABNORMAL HIGH (ref 11–307)

## 2020-05-25 LAB — PROCALCITONIN: Procalcitonin: 0.13 ng/mL

## 2020-05-25 MED ORDER — BARICITINIB 1 MG PO TABS
1.0000 mg | ORAL_TABLET | Freq: Every day | ORAL | Status: DC
  Administered 2020-05-25 – 2020-05-27 (×3): 1 mg via ORAL
  Filled 2020-05-25 (×4): qty 1

## 2020-05-25 MED ORDER — DOCUSATE SODIUM 100 MG PO CAPS
100.0000 mg | ORAL_CAPSULE | Freq: Two times a day (BID) | ORAL | Status: DC
  Administered 2020-05-25 – 2020-05-27 (×4): 100 mg via ORAL
  Filled 2020-05-25 (×4): qty 1

## 2020-05-25 MED ORDER — SODIUM BICARBONATE 650 MG PO TABS
1300.0000 mg | ORAL_TABLET | Freq: Two times a day (BID) | ORAL | Status: DC
  Administered 2020-05-25 – 2020-05-27 (×4): 1300 mg via ORAL
  Filled 2020-05-25 (×4): qty 2

## 2020-05-25 MED ORDER — TACROLIMUS 1 MG PO CAPS
2.0000 mg | ORAL_CAPSULE | Freq: Two times a day (BID) | ORAL | Status: DC
  Administered 2020-05-25 – 2020-05-27 (×4): 2 mg via ORAL
  Filled 2020-05-25 (×7): qty 2

## 2020-05-25 MED ORDER — DM-GUAIFENESIN ER 30-600 MG PO TB12
1.0000 | ORAL_TABLET | Freq: Two times a day (BID) | ORAL | Status: DC
  Administered 2020-05-25 – 2020-05-27 (×4): 1 via ORAL
  Filled 2020-05-25 (×4): qty 1

## 2020-05-25 MED ORDER — POLYETHYLENE GLYCOL 3350 17 G PO PACK
17.0000 g | PACK | Freq: Every day | ORAL | Status: DC
  Administered 2020-05-25 – 2020-05-27 (×3): 17 g via ORAL
  Filled 2020-05-25 (×3): qty 1

## 2020-05-25 MED ORDER — FUROSEMIDE 40 MG PO TABS
40.0000 mg | ORAL_TABLET | Freq: Every day | ORAL | Status: DC
  Administered 2020-05-25 – 2020-05-27 (×3): 40 mg via ORAL
  Filled 2020-05-25 (×3): qty 1

## 2020-05-25 NOTE — Progress Notes (Signed)
Patient taken off bipap this morning by RT and placed on 15 L HF and 15L Nonrebreather. Patient had a coughing spell upon coming off of bipap and some anxiety causing O2 saturations to drop down into the 60's. RT administered inhaler and guided patient to take deep breaths. PRN tussionex administered. With assist by RT, patient guided to prone position. Patient currently more at ease and saturating at 100% with 15L HF and 15L nonrebreather.

## 2020-05-25 NOTE — Progress Notes (Signed)
Patient Demographics:    [name], is a 69 y.o. female, DOB - [birth year], BLT:[id number]  Admit date - 05/03/2020   Admitting Physician Rolla Plate, DO  Outpatient Primary MD for the patient is Sharilyn Sites, MD  LOS - 3   Chief Complaint  Patient presents with  . Shortness of Breath        Subjective:    [name] continues to feel worn out.  She is still very short of breath requiring nonrebreather and high flow nasal cannula.  She cough is less productive.  Still becomes very short of breath with minimal exertion.  Assessment  & Plan :    Principal Problem:   Respiratory failure with hypoxia (HCC) Active Problems:   HTN (hypertension)   OSA (obstructive sleep apnea)   DM2 (diabetes mellitus, type 2) (HCC)   Acute respiratory disease due to COVID-19 virus   Pneumonia due to COVID-19 virus   1)Acute hypoxic respiratory failure secondary to COVID-19 infection/Pneumonia--- The treatment plan and use of medications  for treatment of COVID-19 infection and possible side effects were discussed with patient and daughter -----Patient and daughter verbalizes understanding and agrees to treatment protocols  -severe dyspnea and severe hypoxia persist requiring 15 L of oxygen via HFNC and NRB mask --Patient is positive for COVID-19 infection, chest x-ray with findings of infiltrates/opacities,  patient is tachypneic/hypoxic and requiring continuous supplemental oxygen---patient meets criteria for initiation of Remdesivir AND Steroid therapy per protocol  --Check and trend inflammatory markers including D-dimer, ferritin and  CRP---also follow CBC and CMP --Supplemental oxygen to keep O2 sats above 93% -Follow serial chest x-rays and ABGs as indicated --- Encourage prone positioning for More than 16 hours/day in increments of 2 to 3 hours at a time if able to tolerate --Attempt to maintain euvolemic  state --Zinc and vitamin C as ordered -Albuterol inhaler as needed -Accu-Cheks/fingersticks while on high-dose steroids -PPI while on high-dose steroids --Pt and daughter do not want to receive remdesivir -Procalcitonin 0.1 -Discussed the risks and benefits of baracitinib -Explained that by taking this medication, she would be at increased risk of bacterial infections -Also explained that with her history of diverticulitis, she would also be at increased risk for recurrence of diverticulitis/other bowel issues -Patient and daughter understood, and also acknowledged risk of not using this medication and potentially having worsening respiratory status due to progressive Covid pneumonia -Discussed with pharmacy regarding safety of starting baracitinib, considering she is chronically on Prograf -Patient will be watched closely with serial abdominal exams, daily labs including renal function and transaminases -If she does develop any signs of diverticulitis or bacterial infection, can start on antibiotics and discontinue baricitinib  COVID-19 Labs  Recent Labs    04/30/2020 2011 05/06/2020 2012 05/23/20 0540 05/24/20 0446 05/24/20 0448 05/25/20 0444  DDIMER 0.99*   < > 0.93* 0.78*  --  0.61*  FERRITIN  --    < > 1,853*  --  1,569* 1,528*  LDH 278*  --   --   --   --   --   CRP  --   --  6.6*  --  3.7* 3.1*   < > = values in this interval not displayed.    Lab Results  Component Value Date   SARSCOV2NAA POSITIVE (A) 05/08/2020    2)CKD IV--renal function appears close to baseline, creatinine was 2.0 on 10/18/2018, admission creatinine was 2.24, repeat creatinine on 05/23/2020 is 2.0 -Avoid over aggressive hydration in the setting of Covid infection creatinine on admission=2.24  , , creatinine is now=1.7 -resume lasix and monitor renal function --- renally adjust medications, avoid nephrotoxic agents / dehydration  / hypotension -resume prograf for history of glomerulonephritis  3)  hyponatremia--suspect dehydration related, monitor closely, currently improved  4)DM2-anticipate worsening glycemic control due to high-dose steroids, -Increase Levemir 20 units twice daily along with sliding scale coverage -Started on NovoLog with meal coverage -Overall blood sugars have improved   5)HTN--currently on metoprolol.  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, patient requires DNI, but not DNR status-- ---she would like CPR if needed but no intubation  Disposition/Need for in-Hospital Stay- patient unable to be discharged at this time due to --acute hypoxic respiratory failure secondary to Covid pneumonia requiring IV steroids, supplemental oxygen*  Status is: Inpatient  Remains inpatient appropriate because:acute hypoxic respiratory failure secondary to Covid pneumonia requiring IV steroids, supplemental oxygen*   Disposition: The patient is from: Home              Anticipated d/c is to: TBD              Anticipated d/c date is: > 3 days              Patient currently is not medically stable to d/c. Barriers: Not Clinically Stable- acute hypoxic respiratory failure secondary to Covid pneumonia requiring IV steroids, supplemental oxygen*  Code Status : Partial (--she would like CPR if needed but no intubation)  Family Communication:    (patient is alert, awake and coherent)  Discussed with pts daughter [name] 10/3  Consults  :  na  DVT Prophylaxis  :  - Heparin - SCDs   Lab Results  Component Value Date   PLT 322 05/25/2020    Inpatient Medications  Scheduled Meds: . vitamin C  500 mg Oral Daily  . aspirin EC  81 mg Oral Daily  . Chlorhexidine Gluconate Cloth  6 each  Topical Daily  . dextromethorphan-guaiFENesin  1 tablet Oral BID  . docusate sodium  100 mg Oral BID  . fenofibrate  160 mg Oral Daily  . heparin  5,000 Units Subcutaneous Q8H  . insulin aspart  0-20 Units Subcutaneous TID WC  . insulin aspart  0-5 Units Subcutaneous QHS  . insulin aspart  3 Units Subcutaneous TID WC  . insulin detemir  20 Units Subcutaneous BID  . Ipratropium-Albuterol  1 puff Inhalation Q6H  . levothyroxine  175 mcg Oral Daily  . methylPREDNISolone (SOLU-MEDROL) injection  40 mg Intravenous Q12H  . metoprolol succinate  100 mg Oral Daily  . multivitamin with minerals  1 tablet Oral Daily  . pantoprazole  40 mg Oral Daily  . polyethylene glycol  17 g Oral Daily  . rOPINIRole  1 mg Oral QHS  . rosuvastatin  40 mg Oral QHS  . zinc sulfate  220 mg Oral Daily   Continuous Infusions: . sodium chloride Stopped (05/25/20 1020)   PRN Meds:.acetaminophen, chlorpheniramine-HYDROcodone, guaiFENesin-dextromethorphan, ondansetron **OR** ondansetron (ZOFRAN) IV, oxyCODONE  Anti-infectives (From admission, onward)   Start     Dose/Rate Route Frequency Ordered Stop   05/23/20 1800  remdesivir 100 mg in sodium chloride 0.9 % 100 mL IVPB  Status:  Discontinued       "Followed by" Linked Group Details   100 mg 200 mL/hr over 30 Minutes Intravenous Daily 04/29/2020 2234 05/23/20 1000   05/10/2020 2300  remdesivir 100 mg in sodium chloride 0.9 % 100 mL IVPB       "Followed by" Linked Group Details   100 mg 200 mL/hr over 30 Minutes Intravenous Every 30 min 04/23/2020 2234 05/17/2020 2359        Objective:   Vitals:   05/25/20 1200 05/25/20 1300 05/25/20 1400 05/25/20 1500  BP: (!) 106/45 (!) 99/47 (!) 94/50 (!) 111/40  Pulse: 69 73 60 70  Resp: (!) 25 (!) 26 20 (!) 27  Temp:      TempSrc:      SpO2: (!) 89% 92% 93% 90%  Weight:      Height:        Wt Readings from Last 3 Encounters:  05/24/20 70.3 kg  07/25/19 76.5 kg  01/08/19 68 kg     Intake/Output Summary (Last  24 hours) at 05/25/2020 1723 Last data filed at 05/25/2020 1507 Gross per 24 hour  Intake 370.56 ml  Output 1200 ml  Net -829.44 ml     Physical Exam  General exam: Alert, awake, oriented x 3 Respiratory system: Clear to auscultation. Respiratory effort normal. Cardiovascular system:RRR. No murmurs, rubs, gallops. Gastrointestinal system: Abdomen is nondistended, soft and nontender. No organomegaly or masses felt. Normal bowel sounds heard. Central nervous system: Alert and oriented. No focal neurological deficits. Extremities: No C/C/E, +pedal pulses Skin: No rashes, lesions or ulcers Psychiatry: Judgement and insight appear normal. Mood & affect appropriate.     Data Review:   Micro Results Recent Results (from the past 240 hour(s))  Respiratory Panel by RT PCR (Flu A&B, Covid) - Nasopharyngeal Swab     Status: Abnormal   Collection Time: 05/19/2020  8:29 PM   Specimen: Nasopharyngeal Swab  Result Value Ref Range Status   SARS Coronavirus 2 by RT PCR POSITIVE (A) NEGATIVE Final    Comment: RESULT CALLED TO, READ BACK BY AND VERIFIED WITH: T TALBOT,RN@2150  05/10/2020 MKELLY    Influenza A by PCR NEGATIVE NEGATIVE Final   Influenza B by PCR NEGATIVE NEGATIVE Final    Comment: (NOTE) The Xpert Xpress SARS-CoV-2/FLU/RSV assay is intended as an aid in  the diagnosis of influenza from Nasopharyngeal swab specimens and  should not be used as a sole basis for treatment. Nasal washings and  aspirates are unacceptable for Xpert Xpress SARS-CoV-2/FLU/RSV  testing.  Fact Sheet for Patients: PinkCheek.be  Fact Sheet for Healthcare Providers: GravelBags.it  This test is not yet approved or cleared by the [place] FDA and  has been authorized for detection and/or diagnosis of SARS-CoV-2 by  FDA under an Emergency Use Authorization (EUA). This EUA will remain  in effect (meaning this test can be used) for the duration of the   Covid-19 declaration under Section 564(b)(1) of the Act, 21  U.S.C. section 360bbb-3(b)(1), unless the authorization is  terminated or revoked. Performed at [facility], [address], [place],  [address]   MRSA PCR Screening     Status: None   Collection Time: 05/23/20 10:23 PM   Specimen: Nasopharyngeal  Result Value Ref Range Status   MRSA by PCR NEGATIVE  NEGATIVE Final    Comment:        The GeneXpert MRSA Assay (FDA approved for NASAL specimens only), is one component of a comprehensive MRSA colonization surveillance program. It is not intended to diagnose MRSA infection nor to guide or monitor treatment for MRSA infections. Performed at [facility], [address], [place], [place] [address]     Radiology Reports DG Chest Buna 1 View  Result Date: 05/08/2020 CLINICAL DATA:  Shortness of breath and cough. EXAM: PORTABLE CHEST 1 VIEW COMPARISON:  February 08, 2016 FINDINGS: Mild multifocal infiltrates are seen within the bilateral lung bases and periphery of the mid lung fields. There is no evidence of a pleural effusion or pneumothorax. The heart size and mediastinal contours are within normal limits. Radiopaque surgical clips are seen overlying the right upper quadrant. The visualized skeletal structures are unremarkable. IMPRESSION: Mild bilateral multifocal infiltrates. Electronically Signed   By: Virgina Norfolk M.D.   On: 04/23/2020 21:07     CBC Recent Labs  Lab 05/15/2020 2011 05/23/20 0540 05/24/20 0446 05/25/20 0444  WBC 7.5 5.5 7.6 10.9*  HGB 11.8* 11.1* 9.9* 9.9*  HCT 34.3* 32.8* 28.8* 29.4*  PLT 324 298 300 322  MCV 93.0 92.9 93.5 92.7  MCH 32.0 31.4 32.1 31.2  MCHC 34.4 33.8 34.4 33.7  RDW 14.1 13.9 14.2 14.0  LYMPHSABS 1.1 0.4* 1.1 1.1  MONOABS 0.6 0.1 0.3 0.4  EOSABS 0.0 0.0 0.0 0.0  BASOSABS 0.0 0.0 0.0 0.0    Chemistries  Recent Labs  Lab 05/07/2020 2011 05/23/20 0540 05/24/20 0446 05/25/20 0444  NA 126* 125* 131* 135  K 4.5 4.7 5.0  4.9  CL 95* 98 104 107  CO2 19* 16* 17* 18*  GLUCOSE 198* 335* 210* 108*  BUN 44* 41* 49* 54*  CREATININE 2.24* 2.01* 1.86* 1.79*  CALCIUM 8.5* 8.1* 8.3* 8.7*  MG  --  2.1 2.4 2.5*  AST 38 34 32 35  ALT 19 17 14 14   ALKPHOS 39 34* 34* 38  BILITOT 0.4 0.4 0.4 0.3   ------------------------------------------------------------------------------------------------------------------ No results for input(s): CHOL, HDL, LDLCALC, TRIG, CHOLHDL, LDLDIRECT in the last 72 hours.  Lab Results  Component Value Date   HGBA1C 9.1 (H) 04/30/2020   ------------------------------------------------------------------------------------------------------------------ Recent Labs    05/23/20 0540  TSH 3.321   ------------------------------------------------------------------------------------------------------------------ Recent Labs    05/24/20 0448 05/25/20 0444  FERRITIN 1,569* 1,528*    Coagulation profile No results for input(s): INR, PROTIME in the last 168 hours.  Recent Labs    05/24/20 0446 05/25/20 0444  DDIMER 0.78* 0.61*    Cardiac Enzymes No results for input(s): CKMB, TROPONINI, MYOGLOBIN in the last 168 hours.  Invalid input(s): CK ------------------------------------------------------------------------------------------------------------------ No results found for: BNP   Kathie Dike M.D on 05/25/2020 at 5:23 PM  Go to [email or web address] - for contact info  [facility] - Office  [phone]

## 2020-05-26 DIAGNOSIS — E1121 Type 2 diabetes mellitus with diabetic nephropathy: Secondary | ICD-10-CM | POA: Diagnosis not present

## 2020-05-26 DIAGNOSIS — G4733 Obstructive sleep apnea (adult) (pediatric): Secondary | ICD-10-CM | POA: Diagnosis not present

## 2020-05-26 DIAGNOSIS — J9601 Acute respiratory failure with hypoxia: Secondary | ICD-10-CM | POA: Diagnosis not present

## 2020-05-26 DIAGNOSIS — U071 COVID-19: Secondary | ICD-10-CM | POA: Diagnosis not present

## 2020-05-26 LAB — CBC WITH DIFFERENTIAL/PLATELET
Abs Immature Granulocytes: 0.09 10*3/uL — ABNORMAL HIGH (ref 0.00–0.07)
Basophils Absolute: 0 10*3/uL (ref 0.0–0.1)
Basophils Relative: 0 %
Eosinophils Absolute: 0 10*3/uL (ref 0.0–0.5)
Eosinophils Relative: 0 %
HCT: 30.8 % — ABNORMAL LOW (ref 36.0–46.0)
Hemoglobin: 10.3 g/dL — ABNORMAL LOW (ref 12.0–15.0)
Immature Granulocytes: 1 %
Lymphocytes Relative: 10 %
Lymphs Abs: 1.2 10*3/uL (ref 0.7–4.0)
MCH: 31.4 pg (ref 26.0–34.0)
MCHC: 33.4 g/dL (ref 30.0–36.0)
MCV: 93.9 fL (ref 80.0–100.0)
Monocytes Absolute: 0.5 10*3/uL (ref 0.1–1.0)
Monocytes Relative: 4 %
Neutro Abs: 10.1 10*3/uL — ABNORMAL HIGH (ref 1.7–7.7)
Neutrophils Relative %: 85 %
Platelets: 320 10*3/uL (ref 150–400)
RBC: 3.28 MIL/uL — ABNORMAL LOW (ref 3.87–5.11)
RDW: 14 % (ref 11.5–15.5)
WBC: 11.9 10*3/uL — ABNORMAL HIGH (ref 4.0–10.5)
nRBC: 0 % (ref 0.0–0.2)

## 2020-05-26 LAB — COMPREHENSIVE METABOLIC PANEL
ALT: 52 U/L — ABNORMAL HIGH (ref 0–44)
AST: 178 U/L — ABNORMAL HIGH (ref 15–41)
Albumin: 2.2 g/dL — ABNORMAL LOW (ref 3.5–5.0)
Alkaline Phosphatase: 71 U/L (ref 38–126)
Anion gap: 12 (ref 5–15)
BUN: 55 mg/dL — ABNORMAL HIGH (ref 8–23)
CO2: 21 mmol/L — ABNORMAL LOW (ref 22–32)
Calcium: 8.7 mg/dL — ABNORMAL LOW (ref 8.9–10.3)
Chloride: 104 mmol/L (ref 98–111)
Creatinine, Ser: 1.78 mg/dL — ABNORMAL HIGH (ref 0.44–1.00)
GFR calc Af Amer: 33 mL/min — ABNORMAL LOW (ref >60)
GFR calc non Af Amer: 29 mL/min — ABNORMAL LOW (ref >60)
Glucose, Bld: 92 mg/dL (ref 70–99)
Potassium: 4.9 mmol/L (ref 3.5–5.1)
Sodium: 137 mmol/L (ref 135–145)
Total Bilirubin: 0.5 mg/dL (ref 0.3–1.2)
Total Protein: 6.1 g/dL — ABNORMAL LOW (ref 6.5–8.1)

## 2020-05-26 LAB — GLUCOSE, CAPILLARY
Glucose-Capillary: 139 mg/dL — ABNORMAL HIGH (ref 70–99)
Glucose-Capillary: 91 mg/dL (ref 70–99)
Glucose-Capillary: 44 mg/dL — CL (ref 70–99)
Glucose-Capillary: 49 mg/dL — ABNORMAL LOW (ref 70–99)
Glucose-Capillary: 196 mg/dL — ABNORMAL HIGH (ref 70–99)
Glucose-Capillary: 110 mg/dL — ABNORMAL HIGH (ref 70–99)

## 2020-05-26 LAB — FERRITIN: Ferritin: 1987 ng/mL — ABNORMAL HIGH (ref 11–307)

## 2020-05-26 LAB — C-REACTIVE PROTEIN: CRP: 2.1 mg/dL — ABNORMAL HIGH (ref <1.0)

## 2020-05-26 LAB — D-DIMER, QUANTITATIVE: D-Dimer, Quant: 0.7 ug/mL-FEU — ABNORMAL HIGH (ref 0.00–0.50)

## 2020-05-26 LAB — MAGNESIUM: Magnesium: 2.5 mg/dL — ABNORMAL HIGH (ref 1.7–2.4)

## 2020-05-26 MED ORDER — LORAZEPAM 2 MG/ML IJ SOLN
0.5000 mg | Freq: Three times a day (TID) | INTRAMUSCULAR | Status: DC | PRN
  Administered 2020-05-26 (×2): 0.5 mg via INTRAVENOUS
  Filled 2020-05-26 (×2): qty 1

## 2020-05-26 MED ORDER — INSULIN ASPART 100 UNIT/ML ~~LOC~~ SOLN: 0.0000 [IU] | Freq: Every day | SUBCUTANEOUS | Status: DC

## 2020-05-26 MED ORDER — INSULIN ASPART 100 UNIT/ML ~~LOC~~ SOLN
0.0000 [IU] | Freq: Three times a day (TID) | SUBCUTANEOUS | Status: DC
  Administered 2020-05-27: 11 [IU] via SUBCUTANEOUS
  Administered 2020-05-27: 3 [IU] via SUBCUTANEOUS
  Administered 2020-05-27: 8 [IU] via SUBCUTANEOUS

## 2020-05-26 MED ORDER — DEXTROSE 50 % IV SOLN
50.0000 mL | Freq: Once | INTRAVENOUS | Status: AC
  Administered 2020-05-26: 50 mL via INTRAVENOUS
  Filled 2020-05-26: qty 50

## 2020-05-26 MED ORDER — INSULIN DETEMIR 100 UNIT/ML ~~LOC~~ SOLN
10.0000 [IU] | Freq: Two times a day (BID) | SUBCUTANEOUS | Status: DC
  Filled 2020-05-26 (×2): qty 0.1

## 2020-05-26 NOTE — Progress Notes (Signed)
Inpatient Diabetes Program Recommendations  AACE/ADA: New Consensus Statement on Inpatient Glycemic Control (2015)  Target Ranges:  Prepandial:   less than 140 mg/dL      Peak postprandial:   less than 180 mg/dL (1-2 hours)      Critically ill patients:  140 - 180 mg/dL   Lab Results  Component Value Date   GLUCAP 139 (H) 05/26/2020   HGBA1C 9.1 (H) 05/20/2020    Review of Glycemic Control Results for [name], [name] (MRN [id number]) as of 05/26/2020 11:20  Ref. Range 05/25/2020 08:54 05/25/2020 11:22 05/25/2020 17:36 05/25/2020 21:47 05/26/2020 08:08  Glucose-Capillary Latest Ref Range: 70 - 99 mg/dL 126 (H) Novolog 6 units 131 (H) Novolog 6 units 69 (L) 87 139 (H) Novolog 6 units   Diabetes history: DM2 Outpatient Diabetes medications: Levemir 30 units QAM, Levemir 35 units QPM, Novolog 3-11 units TID with meals Current orders for Inpatient glycemic control: Levemir 10 units bid, Novolog 3 units tid meal coverage if eats 50%, Novolog 0-20 units TID with meals, Novolog 0-5 units QHS; Solumedrol 40 mg bid  Inpatient Diabetes Program Recommendations:   Noted 65 CBG after received Novolog correction + meal coverage -Consider decrease in Novolog correction to moderate 0-15 units tid + hs 0-5  Thank you, Bethena Roys E. Nalleli Largent, RN, MSN, CDE  Diabetes Coordinator Inpatient Glycemic Control Team Team Pager [phone] (8am-5pm) 05/26/2020 11:24 AM

## 2020-05-26 NOTE — Progress Notes (Signed)
Patient Demographics:    [name], is a 69 y.o. female, DOB - [birth year], XJO:[id number]  Admit date - 05/19/2020   Admitting Physician Rolla Plate, DO  Outpatient Primary MD for the patient is Sharilyn Sites, MD  LOS - 4   Chief Complaint  Patient presents with  . Shortness of Breath        Subjective:    [name] continues to feel very weak.  P.o. intake remains poor.  Still feels very short of breath.  Currently on 15 L nasal cannula and nonrebreather  Assessment  & Plan :    Principal Problem:   Respiratory failure with hypoxia (HCC) Active Problems:   HTN (hypertension)   OSA (obstructive sleep apnea)   DM2 (diabetes mellitus, type 2) (HCC)   Acute respiratory disease due to COVID-19 virus   Pneumonia due to COVID-19 virus   1)Acute hypoxic respiratory failure secondary to COVID-19 infection/Pneumonia--- The treatment plan and use of medications  for treatment of COVID-19 infection and possible side effects were discussed with patient and daughter -----Patient and daughter verbalizes understanding and agrees to treatment protocols  -severe dyspnea and severe hypoxia persist requiring 15 L of oxygen via HFNC and NRB mask -May need to consider heated high flow nasal cannula if hypoxia worsens --Patient is positive for COVID-19 infection, chest x-ray with findings of infiltrates/opacities,  patient is tachypneic/hypoxic and requiring continuous supplemental oxygen---patient meets criteria for initiation of Remdesivir AND Steroid therapy per protocol  --Check and trend inflammatory markers including D-dimer, ferritin and  CRP---also follow CBC and CMP --Supplemental oxygen to keep O2 sats above 93% -Follow serial chest x-rays and ABGs as indicated --- Encourage prone positioning for More than 16 hours/day in increments of 2 to 3 hours at a time if able to tolerate --Attempt to maintain  euvolemic state --Zinc and vitamin C as ordered -Albuterol inhaler as needed -Accu-Cheks/fingersticks while on high-dose steroids -PPI while on high-dose steroids --Pt and daughter do not want to receive remdesivir -Procalcitonin 0.1 -Discussed the risks and benefits of baracitinib -Explained that by taking this medication, she would be at increased risk of bacterial infections -Also explained that with her history of diverticulitis, she would also be at increased risk for recurrence of diverticulitis/other bowel issues -Patient and daughter understood, and also acknowledged risk of not using this medication and potentially having worsening respiratory status due to progressive Covid pneumonia -Discussed with pharmacy regarding safety of starting baracitinib, considering she is chronically on Prograf -Patient will be watched closely with serial abdominal exams, daily labs including renal function and transaminases -If she does develop any signs of diverticulitis or bacterial infection, can start on antibiotics and discontinue baricitinib  COVID-19 Labs  Recent Labs    05/24/20 0446 05/24/20 0448 05/25/20 0444 05/26/20 0439  DDIMER 0.78*  --  0.61* 0.70*  FERRITIN  --  1,569* 1,528* 1,987*  CRP  --  3.7* 3.1* 2.1*    Lab Results  Component Value Date   SARSCOV2NAA POSITIVE (A) 04/24/2020    2)CKD IV--renal function appears close to baseline, creatinine was 2.0 on 10/18/2018, admission creatinine was 2.24, repeat creatinine on 05/23/2020 is 2.0 -Avoid over aggressive hydration in the setting of Covid infection creatinine on admission=2.24  , ,  creatinine is now=1.7 -resume lasix and monitor renal function --- renally adjust medications, avoid nephrotoxic agents / dehydration  / hypotension -resume prograf for history of glomerulonephritis  3) hyponatremia--suspect dehydration related, monitor closely, currently improved  4)DM2-anticipate worsening glycemic control due to  high-dose steroids, -Patient currently having hypoglycemic episodes due to poor p.o. intake -We will discontinue basal insulin for now until p.o. intake has improved -Continue sliding scale insulin   5)HTN--currently on metoprolol.  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  Explained that effective cardiopulmonary resuscitation involves intubation to secure airway -Patient's daughter understands and wants to discuss this further with patient -Family will let us know regarding any changes to CODE STATUS  10) elevated liver enzymes. -Abdomen is soft, nontender -She was started on baricitinib yesterday -Discussed with pharmacy, and unlikely that a single dose would result in significant elevation of transaminases -Since she is suffering from severe Covid/respiratory failure, will continue baricitinib for now -Repeat labs in a.m. and if liver enzymes continue to rise, will need to discontinue baricitinib  Disposition/Need for in-Hospital Stay- patient unable to be discharged at this time due to --acute hypoxic respiratory failure secondary to Covid pneumonia requiring IV steroids, supplemental oxygen*  Status is: Inpatient  Remains inpatient appropriate because:acute hypoxic respiratory failure secondary to Covid pneumonia requiring IV steroids, supplemental oxygen*   Disposition: The patient is from: Home              Anticipated d/c is to: TBD               Anticipated d/c date is: > 3 days              Patient currently is not medically stable to d/c. Barriers: Not Clinically Stable- acute hypoxic respiratory failure secondary to Covid pneumonia requiring IV steroids, supplemental oxygen*  Code Status : Partial (--she would like CPR if needed but no intubation)  Family Communication:    (patient is alert, awake and coherent)  Discussed with pts daughter [name] 10/4  Consults  :  na  DVT Prophylaxis  :  - Heparin - SCDs   Lab Results  Component Value Date   PLT 320 05/26/2020    Inpatient Medications  Scheduled Meds: . vitamin C  500 mg Oral Daily  . aspirin EC  81 mg Oral Daily  . baricitinib  1 mg Oral Daily  . Chlorhexidine Gluconate Cloth  6 each Topical Daily  . dextromethorphan-guaiFENesin  1 tablet Oral BID  . docusate sodium  100 mg Oral BID  . fenofibrate  160 mg Oral Daily  . furosemide  40 mg Oral Daily  . heparin  5,000 Units Subcutaneous Q8H  . [START ON 05/27/2020] insulin aspart  0-15 Units Subcutaneous TID WC  . insulin aspart  0-5 Units Subcutaneous QHS  . Ipratropium-Albuterol  1 puff Inhalation Q6H  . levothyroxine  175 mcg Oral Daily  . methylPREDNISolone (SOLU-MEDROL) injection  40 mg Intravenous Q12H  . metoprolol succinate  100 mg Oral Daily  . multivitamin with  minerals  1 tablet Oral Daily  . pantoprazole  40 mg Oral Daily  . polyethylene glycol  17 g Oral Daily  . rOPINIRole  1 mg Oral QHS  . rosuvastatin  40 mg Oral QHS  . sodium bicarbonate  1,300 mg Oral BID  . tacrolimus  2 mg Oral BID  . zinc sulfate  220 mg Oral Daily   Continuous Infusions: . sodium chloride Stopped (05/25/20 1020)   PRN Meds:.acetaminophen, chlorpheniramine-HYDROcodone, guaiFENesin-dextromethorphan, LORazepam, ondansetron **OR** ondansetron (ZOFRAN) IV, oxyCODONE    Anti-infectives (From admission, onward)   Start     Dose/Rate Route Frequency Ordered Stop   05/23/20 1800  remdesivir 100 mg in sodium chloride  0.9 % 100 mL IVPB  Status:  Discontinued       "Followed by" Linked Group Details   100 mg 200 mL/hr over 30 Minutes Intravenous Daily 04/27/2020 2234 05/23/20 1000   05/19/2020 2300  remdesivir 100 mg in sodium chloride 0.9 % 100 mL IVPB       "Followed by" Linked Group Details   100 mg 200 mL/hr over 30 Minutes Intravenous Every 30 min 05/09/2020 2234 04/25/2020 2359        Objective:   Vitals:   05/26/20 1300 05/26/20 1400 05/26/20 1500 05/26/20 1600  BP: 130/69 121/61 111/64   Pulse: 80 81 76 74  Resp: 18 (!) 26 (!) 24 (!) 29  Temp:      TempSrc:      SpO2: 100% (!) 89% (!) 87% (!) 89%  Weight:      Height:        Wt Readings from Last 3 Encounters:  05/24/20 70.3 kg  07/25/19 76.5 kg  01/08/19 68 kg     Intake/Output Summary (Last 24 hours) at 05/26/2020 1809 Last data filed at 05/26/2020 0500 Gross per 24 hour  Intake --  Output 600 ml  Net -600 ml     Physical Exam  General exam: Alert, awake, oriented x 3 Respiratory system: fine crackles bilaterally. Increase resp effort. Cardiovascular system:RRR. No murmurs, rubs, gallops. Gastrointestinal system: Abdomen is nondistended, soft and nontender. No organomegaly or masses felt. Normal bowel sounds heard. Central nervous system: Alert and oriented. No focal neurological deficits. Extremities: No C/C/E, +pedal pulses Skin: No rashes, lesions or ulcers Psychiatry: Judgement and insight appear normal. Mood & affect appropriate.     Data Review:   Micro Results Recent Results (from the past 240 hour(s))  Respiratory Panel by RT PCR (Flu A&B, Covid) - Nasopharyngeal Swab     Status: Abnormal   Collection Time: 04/27/2020  8:29 PM   Specimen: Nasopharyngeal Swab  Result Value Ref Range Status   SARS Coronavirus 2 by RT PCR POSITIVE (A) NEGATIVE Final    Comment: RESULT CALLED TO, READ BACK BY AND VERIFIED WITH: T TALBOT,RN@2150  05/08/2020 MKELLY    Influenza A by PCR NEGATIVE NEGATIVE Final   Influenza B by PCR  NEGATIVE NEGATIVE Final    Comment: (NOTE) The Xpert Xpress SARS-CoV-2/FLU/RSV assay is intended as an aid in  the diagnosis of influenza from Nasopharyngeal swab specimens and  should not be used as a sole basis for treatment. Nasal washings and  aspirates are unacceptable for Xpert Xpress SARS-CoV-2/FLU/RSV  testing.  Fact Sheet for Patients: PinkCheek.be  Fact Sheet for Healthcare Providers: GravelBags.it  This test is not yet approved or cleared by the [place] FDA and  has been authorized for detection and/or diagnosis of SARS-CoV-2 by  FDA under an Emergency  Use Authorization (EUA). This EUA will remain  in effect (meaning this test can be used) for the duration of the  Covid-19 declaration under Section 564(b)(1) of the Act, 21  U.S.C. section 360bbb-3(b)(1), unless the authorization is  terminated or revoked. Performed at [facility], [address], [place], [place] [address]   MRSA PCR Screening     Status: None   Collection Time: 05/23/20 10:23 PM   Specimen: Nasopharyngeal  Result Value Ref Range Status   MRSA by PCR NEGATIVE NEGATIVE Final    Comment:        The GeneXpert MRSA Assay (FDA approved for NASAL specimens only), is one component of a comprehensive MRSA colonization surveillance program. It is not intended to diagnose MRSA infection nor to guide or monitor treatment for MRSA infections. Performed at [facility], [address] [place], [place] [address]     Radiology Reports DG Chest [place] 1 View  Result Date: 05/17/2020 CLINICAL DATA:  Shortness of breath and cough. EXAM: PORTABLE CHEST 1 VIEW COMPARISON:  February 08, 2016 FINDINGS: Mild multifocal infiltrates are seen within the bilateral lung bases and periphery of the mid lung fields. There is no evidence of a pleural effusion or pneumothorax. The heart size and mediastinal contours are within normal limits. Radiopaque surgical clips are  seen overlying the right upper quadrant. The visualized skeletal structures are unremarkable. IMPRESSION: Mild bilateral multifocal infiltrates. Electronically Signed   By: Virgina Norfolk M.D.   On: 05/21/2020 21:07     CBC Recent Labs  Lab 05/06/2020 2011 05/23/20 0540 05/24/20 0446 05/25/20 0444 05/26/20 0439  WBC 7.5 5.5 7.6 10.9* 11.9*  HGB 11.8* 11.1* 9.9* 9.9* 10.3*  HCT 34.3* 32.8* 28.8* 29.4* 30.8*  PLT 324 298 300 322 320  MCV 93.0 92.9 93.5 92.7 93.9  MCH 32.0 31.4 32.1 31.2 31.4  MCHC 34.4 33.8 34.4 33.7 33.4  RDW 14.1 13.9 14.2 14.0 14.0  LYMPHSABS 1.1 0.4* 1.1 1.1 1.2  MONOABS 0.6 0.1 0.3 0.4 0.5  EOSABS 0.0 0.0 0.0 0.0 0.0  BASOSABS 0.0 0.0 0.0 0.0 0.0    Chemistries  Recent Labs  Lab 05/19/2020 2011 05/23/20 0540 05/24/20 0446 05/25/20 0444 05/26/20 0439  NA 126* 125* 131* 135 137  K 4.5 4.7 5.0 4.9 4.9  CL 95* 98 104 107 104  CO2 19* 16* 17* 18* 21*  GLUCOSE 198* 335* 210* 108* 92  BUN 44* 41* 49* 54* 55*  CREATININE 2.24* 2.01* 1.86* 1.79* 1.78*  CALCIUM 8.5* 8.1* 8.3* 8.7* 8.7*  MG  --  2.1 2.4 2.5* 2.5*  AST 38 34 32 35 178*  ALT 19 17 14 14  52*  ALKPHOS 39 34* 34* 38 71  BILITOT 0.4 0.4 0.4 0.3 0.5   ------------------------------------------------------------------------------------------------------------------ No results for input(s): CHOL, HDL, LDLCALC, TRIG, CHOLHDL, LDLDIRECT in the last 72 hours.  Lab Results  Component Value Date   HGBA1C 9.1 (H) 05/17/2020   ------------------------------------------------------------------------------------------------------------------ No results for input(s): TSH, T4TOTAL, T3FREE, THYROIDAB in the last 72 hours.  Invalid input(s): FREET3 ------------------------------------------------------------------------------------------------------------------ Recent Labs    05/25/20 0444 05/26/20 0439  FERRITIN 1,528* 1,987*    Coagulation profile No results for input(s): INR, PROTIME in the  last 168 hours.  Recent Labs    05/25/20 0444 05/26/20 0439  DDIMER 0.61* 0.70*    Cardiac Enzymes No results for input(s): CKMB, TROPONINI, MYOGLOBIN in the last 168 hours.  Invalid input(s): CK ------------------------------------------------------------------------------------------------------------------ No results found for: BNP   Kathie Dike M.D on  05/26/2020 at 6:09 PM  Go to [email or web address] - for contact info  [facility] - Office  [phone]

## 2020-05-27 ENCOUNTER — Inpatient Hospital Stay (HOSPITAL_COMMUNITY): Payer: Medicare HMO

## 2020-05-27 ENCOUNTER — Inpatient Hospital Stay (HOSPITAL_COMMUNITY): Payer: Medicare HMO | Admitting: Anesthesiology

## 2020-05-27 DIAGNOSIS — E861 Hypovolemia: Secondary | ICD-10-CM

## 2020-05-27 DIAGNOSIS — I959 Hypotension, unspecified: Secondary | ICD-10-CM | POA: Diagnosis not present

## 2020-05-27 DIAGNOSIS — E1121 Type 2 diabetes mellitus with diabetic nephropathy: Secondary | ICD-10-CM | POA: Diagnosis not present

## 2020-05-27 DIAGNOSIS — N289 Disorder of kidney and ureter, unspecified: Secondary | ICD-10-CM | POA: Diagnosis not present

## 2020-05-27 DIAGNOSIS — J1282 Pneumonia due to coronavirus disease 2019: Secondary | ICD-10-CM | POA: Diagnosis not present

## 2020-05-27 DIAGNOSIS — I9589 Other hypotension: Secondary | ICD-10-CM | POA: Diagnosis not present

## 2020-05-27 DIAGNOSIS — U071 COVID-19: Secondary | ICD-10-CM | POA: Diagnosis not present

## 2020-05-27 DIAGNOSIS — G4733 Obstructive sleep apnea (adult) (pediatric): Secondary | ICD-10-CM | POA: Diagnosis not present

## 2020-05-27 DIAGNOSIS — J9601 Acute respiratory failure with hypoxia: Secondary | ICD-10-CM | POA: Diagnosis not present

## 2020-05-27 LAB — BLOOD GAS, ARTERIAL
Acid-base deficit: 9.8 mmol/L — ABNORMAL HIGH (ref 0.0–2.0)
Acid-base deficit: 8.6 mmol/L — ABNORMAL HIGH (ref 0.0–2.0)
Bicarbonate: 16.8 mmol/L — ABNORMAL LOW (ref 20.0–28.0)
Bicarbonate: 17.4 mmol/L — ABNORMAL LOW (ref 20.0–28.0)
FIO2: 100
FIO2: 100
O2 Saturation: 96.5 %
O2 Saturation: 98.8 %
Patient temperature: 36.1
Patient temperature: 36.1
pCO2 arterial: <19 mmHg — CL (ref 32.0–48.0)
pCO2 arterial: 25.6 mmHg — ABNORMAL LOW (ref 32.0–48.0)
pH, Arterial: 7.468 — ABNORMAL HIGH (ref 7.350–7.450)
pH, Arterial: 7.395 (ref 7.350–7.450)
pO2, Arterial: 86 mmHg (ref 83.0–108.0)
pO2, Arterial: 152 mmHg — ABNORMAL HIGH (ref 83.0–108.0)

## 2020-05-27 LAB — HEMOGLOBIN AND HEMATOCRIT, BLOOD
HCT: 16.8 % — ABNORMAL LOW (ref 36.0–46.0)
HCT: 17.6 % — ABNORMAL LOW (ref 36.0–46.0)
Hemoglobin: 5.7 g/dL — CL (ref 12.0–15.0)
Hemoglobin: 5.6 g/dL — CL (ref 12.0–15.0)

## 2020-05-27 LAB — CBC WITH DIFFERENTIAL/PLATELET
Abs Immature Granulocytes: 0.17 10*3/uL — ABNORMAL HIGH (ref 0.00–0.07)
Basophils Absolute: 0 10*3/uL (ref 0.0–0.1)
Basophils Relative: 0 %
Eosinophils Absolute: 0 10*3/uL (ref 0.0–0.5)
Eosinophils Relative: 0 %
HCT: 24.5 % — ABNORMAL LOW (ref 36.0–46.0)
Hemoglobin: 8.2 g/dL — ABNORMAL LOW (ref 12.0–15.0)
Immature Granulocytes: 1 %
Lymphocytes Relative: 12 %
Lymphs Abs: 1.9 10*3/uL (ref 0.7–4.0)
MCH: 31.8 pg (ref 26.0–34.0)
MCHC: 33.5 g/dL (ref 30.0–36.0)
MCV: 95 fL (ref 80.0–100.0)
Monocytes Absolute: 0.6 10*3/uL (ref 0.1–1.0)
Monocytes Relative: 4 %
Neutro Abs: 12.8 10*3/uL — ABNORMAL HIGH (ref 1.7–7.7)
Neutrophils Relative %: 83 %
Platelets: 319 10*3/uL (ref 150–400)
RBC: 2.58 MIL/uL — ABNORMAL LOW (ref 3.87–5.11)
RDW: 14.4 % (ref 11.5–15.5)
WBC: 15.5 10*3/uL — ABNORMAL HIGH (ref 4.0–10.5)
nRBC: 0 % (ref 0.0–0.2)

## 2020-05-27 LAB — POCT I-STAT 7, (LYTES, BLD GAS, ICA,H+H)
Acid-base deficit: 10 mmol/L — ABNORMAL HIGH (ref 0.0–2.0)
Bicarbonate: 14.5 mmol/L — ABNORMAL LOW (ref 20.0–28.0)
Calcium, Ion: 1.05 mmol/L — ABNORMAL LOW (ref 1.15–1.40)
HCT: 15 % — ABNORMAL LOW (ref 36.0–46.0)
Hemoglobin: 5.1 g/dL — CL (ref 12.0–15.0)
O2 Saturation: 100 %
Patient temperature: 99.3
Potassium: 5.8 mmol/L — ABNORMAL HIGH (ref 3.5–5.1)
Sodium: 139 mmol/L (ref 135–145)
TCO2: 15 mmol/L — ABNORMAL LOW (ref 22–32)
pCO2 arterial: 26.6 mmHg — ABNORMAL LOW (ref 32.0–48.0)
pH, Arterial: 7.346 — ABNORMAL LOW (ref 7.350–7.450)
pO2, Arterial: 207 mmHg — ABNORMAL HIGH (ref 83.0–108.0)

## 2020-05-27 LAB — MAGNESIUM: Magnesium: 2.8 mg/dL — ABNORMAL HIGH (ref 1.7–2.4)

## 2020-05-27 LAB — COMPREHENSIVE METABOLIC PANEL
ALT: 47 U/L — ABNORMAL HIGH (ref 0–44)
AST: 72 U/L — ABNORMAL HIGH (ref 15–41)
Albumin: 2.6 g/dL — ABNORMAL LOW (ref 3.5–5.0)
Alkaline Phosphatase: 67 U/L (ref 38–126)
Anion gap: 12 (ref 5–15)
BUN: 75 mg/dL — ABNORMAL HIGH (ref 8–23)
CO2: 20 mmol/L — ABNORMAL LOW (ref 22–32)
Calcium: 8.3 mg/dL — ABNORMAL LOW (ref 8.9–10.3)
Chloride: 105 mmol/L (ref 98–111)
Creatinine, Ser: 2.81 mg/dL — ABNORMAL HIGH (ref 0.44–1.00)
GFR calc Af Amer: 19 mL/min — ABNORMAL LOW (ref >60)
GFR calc non Af Amer: 17 mL/min — ABNORMAL LOW (ref >60)
Glucose, Bld: 149 mg/dL — ABNORMAL HIGH (ref 70–99)
Potassium: 5 mmol/L (ref 3.5–5.1)
Sodium: 137 mmol/L (ref 135–145)
Total Bilirubin: 0.7 mg/dL (ref 0.3–1.2)
Total Protein: 5.7 g/dL — ABNORMAL LOW (ref 6.5–8.1)

## 2020-05-27 LAB — GLUCOSE, CAPILLARY
Glucose-Capillary: 41 mg/dL — CL (ref 70–99)
Glucose-Capillary: 168 mg/dL — ABNORMAL HIGH (ref 70–99)
Glucose-Capillary: 280 mg/dL — ABNORMAL HIGH (ref 70–99)
Glucose-Capillary: 334 mg/dL — ABNORMAL HIGH (ref 70–99)
Glucose-Capillary: 277 mg/dL — ABNORMAL HIGH (ref 70–99)
Glucose-Capillary: 193 mg/dL — ABNORMAL HIGH (ref 70–99)

## 2020-05-27 LAB — CBC
HCT: 16.9 % — ABNORMAL LOW (ref 36.0–46.0)
Hemoglobin: 5.7 g/dL — CL (ref 12.0–15.0)
MCH: 32.2 pg (ref 26.0–34.0)
MCHC: 33.7 g/dL (ref 30.0–36.0)
MCV: 95.5 fL (ref 80.0–100.0)
Platelets: 361 10*3/uL (ref 150–400)
RBC: 1.77 MIL/uL — ABNORMAL LOW (ref 3.87–5.11)
RDW: 14.5 % (ref 11.5–15.5)
WBC: 19.4 10*3/uL — ABNORMAL HIGH (ref 4.0–10.5)
nRBC: 0 % (ref 0.0–0.2)

## 2020-05-27 LAB — DIC (DISSEMINATED INTRAVASCULAR COAGULATION)PANEL
D-Dimer, Quant: 1.66 ug/mL-FEU — ABNORMAL HIGH (ref 0.00–0.50)
Fibrinogen: 496 mg/dL — ABNORMAL HIGH (ref 210–475)
INR: 1.5 — ABNORMAL HIGH (ref 0.8–1.2)
Platelets: 365 10*3/uL (ref 150–400)
Prothrombin Time: 17.4 seconds — ABNORMAL HIGH (ref 11.4–15.2)
Smear Review: NONE SEEN
aPTT: 33 seconds (ref 24–36)

## 2020-05-27 LAB — LACTIC ACID, PLASMA: Lactic Acid, Venous: 3 mmol/L (ref 0.5–1.9)

## 2020-05-27 LAB — PROCALCITONIN: Procalcitonin: 0.58 ng/mL

## 2020-05-27 LAB — PREPARE RBC (CROSSMATCH)

## 2020-05-27 LAB — D-DIMER, QUANTITATIVE: D-Dimer, Quant: 0.92 ug/mL-FEU — ABNORMAL HIGH (ref 0.00–0.50)

## 2020-05-27 LAB — C-REACTIVE PROTEIN: CRP: 1.9 mg/dL — ABNORMAL HIGH (ref <1.0)

## 2020-05-27 LAB — FERRITIN: Ferritin: 1807 ng/mL — ABNORMAL HIGH (ref 11–307)

## 2020-05-27 LAB — MRSA PCR SCREENING: MRSA by PCR: NEGATIVE

## 2020-05-27 MED ORDER — SODIUM CHLORIDE 0.9 % IV SOLN: INTRAVENOUS | Status: AC

## 2020-05-27 MED ORDER — ALBUMIN HUMAN 5 % IV SOLN
12.5000 g | Freq: Once | INTRAVENOUS | Status: AC
  Administered 2020-05-27: 12.5 g via INTRAVENOUS
  Filled 2020-05-27 (×2): qty 250

## 2020-05-27 MED ORDER — DOCUSATE SODIUM 50 MG/5ML PO LIQD
100.0000 mg | Freq: Two times a day (BID) | ORAL | Status: DC
  Administered 2020-05-27 – 2020-06-02 (×12): 100 mg
  Filled 2020-05-27 (×12): qty 10

## 2020-05-27 MED ORDER — FENTANYL CITRATE (PF) 100 MCG/2ML IJ SOLN
25.0000 ug | Freq: Once | INTRAMUSCULAR | Status: AC
  Administered 2020-05-27: 25 ug via INTRAVENOUS
  Filled 2020-05-27: qty 2

## 2020-05-27 MED ORDER — SODIUM CHLORIDE 0.9 % IV SOLN
3.0000 g | Freq: Two times a day (BID) | INTRAVENOUS | Status: DC
  Administered 2020-05-27 – 2020-05-28 (×2): 3 g via INTRAVENOUS
  Filled 2020-05-27 (×4): qty 8

## 2020-05-27 MED ORDER — FENTANYL BOLUS VIA INFUSION
25.0000 ug | INTRAVENOUS | Status: DC | PRN
  Administered 2020-05-27 – 2020-06-13 (×11): 25 ug via INTRAVENOUS
  Filled 2020-05-27: qty 25

## 2020-05-27 MED ORDER — ORAL CARE MOUTH RINSE
15.0000 mL | OROMUCOSAL | Status: DC
  Administered 2020-05-27 – 2020-06-14 (×178): 15 mL via OROMUCOSAL

## 2020-05-27 MED ORDER — DEXMEDETOMIDINE HCL IN NACL 400 MCG/100ML IV SOLN
0.4000 ug/kg/h | INTRAVENOUS | Status: DC
  Administered 2020-05-27: 0.4 ug/kg/h via INTRAVENOUS
  Filled 2020-05-27 (×2): qty 100

## 2020-05-27 MED ORDER — LORAZEPAM 2 MG/ML IJ SOLN: 1.0000 mg | Freq: Once | INTRAMUSCULAR | Status: DC

## 2020-05-27 MED ORDER — PANTOPRAZOLE SODIUM 40 MG IV SOLR
40.0000 mg | INTRAVENOUS | Status: DC
  Administered 2020-05-27: 40 mg via INTRAVENOUS
  Filled 2020-05-27: qty 40

## 2020-05-27 MED ORDER — MORPHINE SULFATE (PF) 2 MG/ML IV SOLN
2.0000 mg | INTRAVENOUS | Status: DC | PRN
  Administered 2020-05-27: 2 mg via INTRAVENOUS
  Filled 2020-05-27: qty 1

## 2020-05-27 MED ORDER — INSULIN ASPART 100 UNIT/ML ~~LOC~~ SOLN: 2.0000 [IU] | SUBCUTANEOUS | Status: DC

## 2020-05-27 MED ORDER — PROPOFOL 1000 MG/100ML IV EMUL
0.0000 ug/kg/min | INTRAVENOUS | Status: DC
  Administered 2020-05-27: 5 ug/kg/min via INTRAVENOUS
  Administered 2020-05-28: 30 ug/kg/min via INTRAVENOUS
  Administered 2020-05-28 – 2020-05-29 (×2): 20 ug/kg/min via INTRAVENOUS
  Administered 2020-05-29: 30 ug/kg/min via INTRAVENOUS
  Filled 2020-05-27 (×4): qty 100

## 2020-05-27 MED ORDER — INSULIN ASPART 100 UNIT/ML ~~LOC~~ SOLN
0.0000 [IU] | SUBCUTANEOUS | Status: DC
  Administered 2020-05-27: 11 [IU] via SUBCUTANEOUS
  Administered 2020-05-27: 4 [IU] via SUBCUTANEOUS
  Administered 2020-05-28: 7 [IU] via SUBCUTANEOUS
  Administered 2020-05-28: 4 [IU] via SUBCUTANEOUS
  Administered 2020-05-28: 3 [IU] via SUBCUTANEOUS
  Administered 2020-05-28 – 2020-05-29 (×3): 7 [IU] via SUBCUTANEOUS
  Administered 2020-05-29: 11 [IU] via SUBCUTANEOUS
  Administered 2020-05-29: 7 [IU] via SUBCUTANEOUS
  Administered 2020-05-29: 15 [IU] via SUBCUTANEOUS
  Administered 2020-05-29 (×2): 11 [IU] via SUBCUTANEOUS
  Administered 2020-05-29: 15 [IU] via SUBCUTANEOUS
  Administered 2020-05-30: 4 [IU] via SUBCUTANEOUS
  Administered 2020-05-30: 7 [IU] via SUBCUTANEOUS
  Administered 2020-05-30 (×3): 4 [IU] via SUBCUTANEOUS
  Administered 2020-05-31: 3 [IU] via SUBCUTANEOUS
  Administered 2020-05-31 – 2020-06-01 (×4): 4 [IU] via SUBCUTANEOUS
  Administered 2020-06-01 (×2): 3 [IU] via SUBCUTANEOUS
  Administered 2020-06-01 – 2020-06-02 (×2): 4 [IU] via SUBCUTANEOUS
  Administered 2020-06-02 – 2020-06-03 (×3): 3 [IU] via SUBCUTANEOUS
  Administered 2020-06-03: 4 [IU] via SUBCUTANEOUS
  Administered 2020-06-03: 7 [IU] via SUBCUTANEOUS
  Administered 2020-06-03: 3 [IU] via SUBCUTANEOUS
  Administered 2020-06-03: 4 [IU] via SUBCUTANEOUS
  Administered 2020-06-03 – 2020-06-04 (×2): 3 [IU] via SUBCUTANEOUS
  Administered 2020-06-04: 4 [IU] via SUBCUTANEOUS
  Administered 2020-06-04: 7 [IU] via SUBCUTANEOUS
  Administered 2020-06-04 (×2): 3 [IU] via SUBCUTANEOUS
  Administered 2020-06-04: 4 [IU] via SUBCUTANEOUS
  Administered 2020-06-05: 3 [IU] via SUBCUTANEOUS
  Administered 2020-06-05 (×2): 4 [IU] via SUBCUTANEOUS
  Administered 2020-06-05 – 2020-06-06 (×3): 3 [IU] via SUBCUTANEOUS
  Administered 2020-06-06 (×2): 7 [IU] via SUBCUTANEOUS
  Administered 2020-06-06 (×2): 4 [IU] via SUBCUTANEOUS
  Administered 2020-06-06: 3 [IU] via SUBCUTANEOUS
  Administered 2020-06-07 (×4): 7 [IU] via SUBCUTANEOUS
  Administered 2020-06-07 – 2020-06-08 (×5): 4 [IU] via SUBCUTANEOUS
  Administered 2020-06-08: 7 [IU] via SUBCUTANEOUS
  Administered 2020-06-08: 4 [IU] via SUBCUTANEOUS
  Administered 2020-06-08: 7 [IU] via SUBCUTANEOUS
  Administered 2020-06-09: 3 [IU] via SUBCUTANEOUS
  Administered 2020-06-09 – 2020-06-10 (×4): 4 [IU] via SUBCUTANEOUS
  Administered 2020-06-10: 7 [IU] via SUBCUTANEOUS
  Administered 2020-06-10: 3 [IU] via SUBCUTANEOUS
  Administered 2020-06-10: 4 [IU] via SUBCUTANEOUS
  Administered 2020-06-10: 3 [IU] via SUBCUTANEOUS
  Administered 2020-06-11: 4 [IU] via SUBCUTANEOUS
  Administered 2020-06-11: 3 [IU] via SUBCUTANEOUS
  Administered 2020-06-11: 7 [IU] via SUBCUTANEOUS
  Administered 2020-06-12: 11 [IU] via SUBCUTANEOUS
  Administered 2020-06-12: 4 [IU] via SUBCUTANEOUS
  Administered 2020-06-12 (×3): 7 [IU] via SUBCUTANEOUS
  Administered 2020-06-13 – 2020-06-14 (×4): 3 [IU] via SUBCUTANEOUS

## 2020-05-27 MED ORDER — POLYETHYLENE GLYCOL 3350 17 G PO PACK: 17.0000 g | PACK | Freq: Every day | ORAL | Status: DC

## 2020-05-27 MED ORDER — SODIUM CHLORIDE 0.9 % IV BOLUS
500.0000 mL | Freq: Once | INTRAVENOUS | Status: AC
  Administered 2020-05-27: 500 mL via INTRAVENOUS

## 2020-05-27 MED ORDER — LORAZEPAM 2 MG/ML IJ SOLN
0.5000 mg | Freq: Once | INTRAMUSCULAR | Status: AC
  Administered 2020-05-27: 0.5 mg via INTRAVENOUS
  Filled 2020-05-27: qty 1

## 2020-05-27 MED ORDER — SODIUM CHLORIDE 0.9 % IV BOLUS
1000.0000 mL | Freq: Once | INTRAVENOUS | Status: AC
  Administered 2020-05-27: 1000 mL via INTRAVENOUS

## 2020-05-27 MED ORDER — MIDAZOLAM HCL 2 MG/2ML IJ SOLN: 1.0000 mg | INTRAMUSCULAR | Status: DC | PRN

## 2020-05-27 MED ORDER — SODIUM CHLORIDE 0.9% IV SOLUTION: Freq: Once | INTRAVENOUS | Status: DC

## 2020-05-27 MED ORDER — SODIUM CHLORIDE 0.9% IV SOLUTION: Freq: Once | INTRAVENOUS | Status: AC

## 2020-05-27 MED ORDER — TACROLIMUS 1 MG PO CAPS
1.0000 mg | ORAL_CAPSULE | Freq: Two times a day (BID) | ORAL | Status: DC
  Administered 2020-05-27 – 2020-05-28 (×2): 1 mg via SUBLINGUAL
  Filled 2020-05-27 (×3): qty 1

## 2020-05-27 MED ORDER — MORPHINE SULFATE (PF) 2 MG/ML IV SOLN
2.0000 mg | Freq: Once | INTRAVENOUS | Status: AC
  Administered 2020-05-27: 2 mg via INTRAVENOUS
  Filled 2020-05-27: qty 1

## 2020-05-27 MED ORDER — ETOMIDATE 2 MG/ML IV SOLN
INTRAVENOUS | Status: DC | PRN
  Administered 2020-05-27: 120 mg via INTRAVENOUS

## 2020-05-27 MED ORDER — MIDAZOLAM HCL 2 MG/2ML IJ SOLN
1.0000 mg | INTRAMUSCULAR | Status: DC | PRN
  Administered 2020-05-27: 1 mg via INTRAVENOUS
  Filled 2020-05-27: qty 2

## 2020-05-27 MED ORDER — LEVOTHYROXINE SODIUM 75 MCG PO TABS
175.0000 ug | ORAL_TABLET | Freq: Every day | ORAL | Status: DC
  Administered 2020-05-28 – 2020-06-14 (×17): 175 ug
  Filled 2020-05-27 (×18): qty 1

## 2020-05-27 MED ORDER — PROPOFOL 1000 MG/100ML IV EMUL
INTRAVENOUS | Status: AC
  Filled 2020-05-27: qty 100

## 2020-05-27 MED ORDER — FENTANYL 2500MCG IN NS 250ML (10MCG/ML) PREMIX INFUSION
0.0000 ug/h | INTRAVENOUS | Status: DC
  Administered 2020-05-27: 25 ug/h via INTRAVENOUS
  Administered 2020-05-28 (×3): 250 ug/h via INTRAVENOUS
  Administered 2020-05-29: 400 ug/h via INTRAVENOUS
  Administered 2020-05-29: 250 ug/h via INTRAVENOUS
  Administered 2020-05-29 – 2020-05-30 (×5): 400 ug/h via INTRAVENOUS
  Administered 2020-05-31: 40 ug/h via INTRAVENOUS
  Administered 2020-05-31: 325 ug/h via INTRAVENOUS
  Administered 2020-05-31: 250 ug/h via INTRAVENOUS
  Administered 2020-06-01 – 2020-06-04 (×15): 400 ug/h via INTRAVENOUS
  Administered 2020-06-05: 40 ug/h via INTRAVENOUS
  Administered 2020-06-05 (×2): 400 ug/h via INTRAVENOUS
  Administered 2020-06-05 – 2020-06-06 (×2): 200 ug/h via INTRAVENOUS
  Administered 2020-06-06 – 2020-06-10 (×15): 400 ug/h via INTRAVENOUS
  Administered 2020-06-10: 250 ug/h via INTRAVENOUS
  Administered 2020-06-10: 375 ug/h via INTRAVENOUS
  Administered 2020-06-11: 300 ug/h via INTRAVENOUS
  Administered 2020-06-11 – 2020-06-13 (×7): 250 ug/h via INTRAVENOUS
  Administered 2020-06-14: 200 ug/h via INTRAVENOUS
  Filled 2020-05-27 (×60): qty 250

## 2020-05-27 MED ORDER — SODIUM CHLORIDE 0.9 % IV SOLN
250.0000 mL | INTRAVENOUS | Status: DC
  Administered 2020-06-07 – 2020-06-13 (×2): 250 mL via INTRAVENOUS

## 2020-05-27 MED ORDER — NOREPINEPHRINE 4 MG/250ML-% IV SOLN
0.0000 ug/min | INTRAVENOUS | Status: DC
  Administered 2020-05-27: 20 ug/min via INTRAVENOUS
  Administered 2020-05-27: 25 ug/min via INTRAVENOUS
  Administered 2020-05-28: 9 ug/min via INTRAVENOUS
  Administered 2020-05-28: 7 ug/min via INTRAVENOUS
  Administered 2020-05-29 (×2): 6 ug/min via INTRAVENOUS
  Administered 2020-05-31: 2 ug/min via INTRAVENOUS
  Filled 2020-05-27 (×7): qty 250

## 2020-05-27 MED ORDER — CHLORHEXIDINE GLUCONATE 0.12% ORAL RINSE (MEDLINE KIT)
15.0000 mL | Freq: Two times a day (BID) | OROMUCOSAL | Status: DC
  Administered 2020-05-27 – 2020-06-14 (×37): 15 mL via OROMUCOSAL

## 2020-05-27 MED ORDER — NOREPINEPHRINE 4 MG/250ML-% IV SOLN
2.0000 ug/min | INTRAVENOUS | Status: DC
  Administered 2020-05-27: 10 ug/min via INTRAVENOUS
  Administered 2020-05-27: 4 ug/min via INTRAVENOUS
  Filled 2020-05-27 (×2): qty 250

## 2020-05-27 NOTE — Progress Notes (Signed)
Notified E-link critical lactic acid of 3.0.

## 2020-05-27 NOTE — Progress Notes (Signed)
Initial Nutrition Assessment  DOCUMENTATION CODES:   Not applicable  INTERVENTION:  Once hemodynamically stable, recommend initiating Vital 1.5 @ 35 ml/hr, advanced 10 ml every 6 hours to goal 55 ml/hr (1320 ml/day) with 45 ml ProSource TF twice daily via OG tube  This regimen at goal rate will provide 2060 kcal, 111 grams protein, and 1003 ml free water   Monitor magnesium, potassium, and phosphorus daily for at least 3 days, MD to replete as needed, as pt is at risk for refeeding syndrome given decreased po intake 2 weeks prior to admission per H&P  NUTRITION DIAGNOSIS:   Inadequate oral intake related to inability to eat as evidenced by NPO status.    GOAL:   Patient will meet greater than or equal to 90% of their needs    MONITOR:   Weight trends, Vent status, Labs, I & O's, TF tolerance  REASON FOR ASSESSMENT:   Consult Enteral/tube feeding initiation and management  ASSESSMENT:  69 year old female with history of DM2, OSA, hypothyroidism, HTN, HLD, hepatitis, GERD, and diverticulitis presented with 2 week history of worsening dyspnea, cough, weakness, and decreased appetite.  Patient admitted on 05/12/2020 for respiratory failure with hypoxia secondary to COVID-19 virus infection.   Overall respiratory status has continued to decline, on HHFNC at 40 L with NRB as well as BiPAP and intubated on 10/05-intubated.   Patient is currently sedated and intubated on ventilator support MV: 15 L/min Temp (24hrs), Avg:97.1 F (36.2 C), Min:96.8 F (36 C), Max:97.3 F (36.3 C)  BP: 141/60 (cuff) MAP: 88 (cuff)  Unable to obtain history at this time, pt discussed in  Rounds, noted very poor po intake secondary to respiratory status. Pt reported 2 weeks of decreased appetite per H&P.  No recent wt history for review. Last wt prior to admit 76.5 kg (168.3 lb) on 07/25/19  Medications reviewed and include: Vitamin C, Mucinex, Colace, Miralax, Fenofibrate, SSI,  Methylprednisolone, MVI, Protonix, Sodium bicarbonate, Zinc sulfate Drips: Unasyn 3g every 12 hours Precedex 0.6 mcg Fentanyl 25 mcg/hr Levo 4 mcg IVF: NaCl @ 100 ml/hr  Labs: CBGs 280,168,110, BUN 75 (H), Cr 2.81 (H), Mg 2.8 (H), WBC 15.5 (H), Hgb 8.2 (L), HCT 24.5 (L)  NUTRITION - FOCUSED PHYSICAL EXAM: Unable to complete at this time, RD working remotely.  Diet Order:   Diet Order            Diet NPO time specified  Diet effective now                 EDUCATION NEEDS:   No education needs have been identified at this time  Skin:  Skin Assessment: Reviewed RN Assessment  Last BM:  10/04  Height:   Ht Readings from Last 1 Encounters:  05/14/2020 5\' 1"  (1.549 m)    Weight:   Wt Readings from Last 1 Encounters:  05/24/20 70.3 kg    BMI:  Body mass index is 29.28 kg/m.  Estimated Nutritional Needs:   Kcal:  2039  Protein:  105-120  Fluid:  >/= 2 L/day   Lajuan Lines, RD, LDN Clinical Nutrition After Hours/Weekend Pager # in [place]

## 2020-05-27 NOTE — Transfer of Care (Signed)
Immediate Anesthesia Transfer of Care Note  Patient: [name]  Procedure(s) Performed: AN AD [place]  Patient Location: ICU  Anesthesia Type:General  Level of Consciousness: unresponsive  Airway & Oxygen Therapy: Patient placed on Ventilator (see vital sign flow sheet for setting)  Post-op Assessment: Report given to RN  Post vital signs: stable  Last Vitals:  Vitals Value Taken Time  BP 38/17 05/27/20 1330  Temp    Pulse 69 05/27/20 1327  Resp 36 05/27/20 1331  SpO2 100 % 05/27/20 1327  Vitals shown include unvalidated device data.  Last Pain:  Vitals:   05/27/20 0300  TempSrc: Axillary  PainSc:          Complications: No complications documented.

## 2020-05-27 NOTE — Progress Notes (Signed)
[facility] Progress Note Patient Name: TAKEYAH WIEMAN DOB: [birth year] MRN: [id number]   Date of Service  05/27/2020  HPI/Events of Note  Multiple issues: 1. Hyperglycemia - blood glucose = 280 --> 334 --> 277, 2. 2. Hgb = 5.1 and 3. lactic acid level  = 3.0.  eICU Interventions  Plan: 1. Increase to Q 4 hour resistant Novolog SSI. 2. D/C Heparin [place]. 3. SCDs to bilateral lower extremities. 4. Trend Lactic Acid post transfusion.     Intervention Category Major Interventions: Hyperglycemia - active titration of insulin therapy;Hemorrhage - evaluation and management  Lysle Dingwall 05/27/2020, 9:24 PM

## 2020-05-27 NOTE — Anesthesia Preprocedure Evaluation (Signed)
Anesthesia Evaluation  Patient identified by MRN, date of birth, ID band Patient unresponsive    Reviewed: Allergy & Precautions, H&P , Patient's Chart, lab work & pertinent test results, reviewed documented beta blocker date and time , Unable to perform ROS - Chart review onlyPreop documentation limited or incomplete due to emergent nature of procedure.  Airway Mallampati: II  TM Distance: >3 FB Neck ROM: full    Dental no notable dental hx.    Pulmonary sleep apnea , pneumonia,    Pulmonary exam normal breath sounds clear to auscultation       Cardiovascular Exercise Tolerance: Good hypertension, + dysrhythmias + Valvular Problems/Murmurs  Rhythm:regular Rate:Normal     Neuro/Psych  Headaches, negative psych ROS   GI/Hepatic GERD  Medicated,(+) Hepatitis -  Endo/Other  diabetesHypothyroidism   Renal/GU ARFRenal disease  negative genitourinary   Musculoskeletal   Abdominal   Peds  Hematology  (+) Blood dyscrasia, anemia ,   Anesthesia Other Findings   Reproductive/Obstetrics negative OB ROS                             Anesthesia Physical Anesthesia Plan  ASA: IV and emergent  Anesthesia Plan: General   Post-op Pain Management:    Induction:   PONV Risk Score and Plan:   Airway Management Planned:   Additional Equipment:   Intra-op Plan:   Post-operative Plan:   Informed Consent: I have reviewed the patients History and Physical, chart, labs and discussed the procedure including the risks, benefits and alternatives for the proposed anesthesia with the patient or authorized representative who has indicated his/her understanding and acceptance.     Only emergency history available  Plan Discussed with: CRNA  Anesthesia Plan Comments:         Anesthesia Quick Evaluation

## 2020-05-27 NOTE — Progress Notes (Signed)
Informed blood consent obtained from patient's daughter ,[name] , with Hayden Pedro, NP.

## 2020-05-27 NOTE — Anesthesia Procedure Notes (Signed)
Procedure Name: Intubation Date/Time: 05/27/2020 1:21 PM Performed by: Tacy Learn, CRNA Pre-anesthesia Checklist: Patient identified, Emergency Drugs available, Patient being monitored, Suction available and Timeout performed Patient Re-evaluated:Patient Re-evaluated prior to induction Oxygen Delivery Method: Ambu bag Preoxygenation: Pre-oxygenation with 100% oxygen Induction Type: IV induction and Rapid sequence Ventilation: Mask ventilation without difficulty Laryngoscope Size: Mac and 3 Grade View: Grade III Tube type: Oral Tube size: 7.5 mm Number of attempts: 1 Airway Equipment and Method: Stylet Placement Confirmation: ETT inserted through vocal cords under direct vision,  positive ETCO2,  CO2 detector and breath sounds checked- equal and bilateral Secured at: 21 cm Tube secured with: Tape Dental Injury: Teeth and Oropharynx as per pre-operative assessment

## 2020-05-27 NOTE — Procedures (Signed)
Procedure Note  05/27/20     Preoperative Diagnosis: Hypotension, COVID, Acute on Chronic renal failure    Postoperative Diagnosis: Same   Procedure(s) Performed: Central Line placement, left internal jugular    Surgeon: Lanell Matar. Constance Haw, MD   Assistants: None   Anesthesia: 1% lidocaine; 25 mcg bolus fentanyl    Complications: None    Indications: [name] is a 69 y.o. with COVID who is now required intubation and pressors. She has a acute on chronic renal failure and needs pressors via a central line. I discussed the risk and benefits of placement of the central line with her daughter, [name] as her spouse is now in the ED with COVID and cannot make decisions. We discussed the risk including but not limited to bleeding, infection, and risk of pneumothorax. She has given verbal telephone consent for the procedure.    Procedure: The patient placed supine. The left chest and neck was prepped and draped in the usual sterile fashion.  Wearing full gown and gloves, I performed the procedure.  One percent lidocaine was used for local anesthesia. The patient was very agitated and required some fentanyl with sedation and pain control. A bolus of 25 mcg of fentanyl was given. An ultrasound was utilized to assess the jugular vein.  The needle with syringe was advanced into the vein with dark venous return, and a wire was placed using the Seldinger technique without difficulty.  Ectopia was not noted.  The skin was knicked and a dilator was placed, and the three lumen catheter was placed over the wire with continued control of the wire.  There was good draw back of blood from all three lumens and each flushed easily with saline.  The catheter was secured in 4 points with 2-0 silk and a biopatch and dressing was placed.     The patient tolerated the procedure well, and the CXR was ordered to confirm position of the central line.   Curlene Labrum, MD [facility] [address] [place], [place] [address] [phone] (office)

## 2020-05-27 NOTE — Progress Notes (Signed)
Called Blood bank to request blood products, blood bank said they need order to transfuse as order from [facility] does not carry over, called E-link to request order to transfuse.

## 2020-05-27 NOTE — Plan of Care (Signed)
CRITICAL VALUE ALERT  Critical Value:  Hgb 5.7  Date & Time Notied:  05/27/20 1835  Provider Notified: Dr. Roderic Palau  Orders Received/Actions taken: See new orders

## 2020-05-27 NOTE — Progress Notes (Signed)
Notified by staff of hemoglobin of 5.7.  This was confirmed on recheck.  She is not had any obvious, visible bleeding.  Stool for occult blood has been ordered.  2 units of PRBC were ordered at [facility], but she has transported to [facility] prior to transfusion.  This will need to be completed at [facility].  Consider further work-up with CT abdomen pelvis to rule out any internal bleeding.  [employer]

## 2020-05-27 NOTE — Progress Notes (Signed)
Pharmacy Antibiotic Note  [name] is a 69 y.o. female admitted on 05/14/2020 with intra-abdominal infection.  Pharmacy has been consulted for unasyn dosing. Concern for diverticulitis/other bowel issues.  Acute hypoxic respiratory failure secondary to COVID-19 infection/Pneumonia  Plan: Unasyn 3gm IV q12h F/U cxs and clinical progress Monitor V/S, labs  Height: 5\' 1"  (154.9 cm) Weight: 70.3 kg (154 lb 15.7 oz) IBW/kg (Calculated) : 47.8  Temp (24hrs), Avg:97.1 F (36.2 C), Min:96.8 F (36 C), Max:97.3 F (36.3 C)  Recent Labs  Lab 05/17/2020 2011 05/21/2020 2011 05/23/20 0540 05/24/20 0446 05/25/20 0444 05/26/20 0439 05/27/20 0557  WBC 7.5   < > 5.5 7.6 10.9* 11.9* 15.5*  CREATININE 2.24*   < > 2.01* 1.86* 1.79* 1.78* 2.81*  LATICACIDVEN 1.3  --   --   --   --   --   --    < > = values in this interval not displayed.    Estimated Creatinine Clearance: 17.2 mL/min (A) (by C-G formula based on SCr of 2.81 mg/dL (H)).    Allergies  Allergen Reactions  . Codeine Nausea And Vomiting  . Ibuprofen Other (See Comments)    Per kidney MD.  . Nsaids Other (See Comments)    Per Kidney DR. No NSAIDS  . Shellfish Allergy Other (See Comments)    REACTION: Gout Flare-ups   . Septra Ds [Sulfamethoxazole-Trimethoprim] Itching and Rash    Antimicrobials this admission: Unasyn 10/5 >>  Dose adjustments this admission: unasyn prn  Microbiology results: 9/30 SARS-3 CV is positive 10/1 MRSA PCR is negative  Thank you for allowing pharmacy to be a part of this patient's care.  Isac Sarna, BS Pharm D, [place] Clinical Pharmacist Pager [phone] 05/27/2020 11:26 AM

## 2020-05-27 NOTE — Care Plan (Addendum)
Patient arrived from [facility]. Seen Earlier in the shift by Dr. Melvyn Novas.   Briefly patient has COIVD. PMH of DM, OSA, Hypothyrodism, HLD, Hepatitis, GERD, Diverticulitis. Intubated 10/5. Hemoglobin 5.7. on 20 mcg/min levophed.   Will send Type and Screen and Repeat Hemoglobin, Send Coags. CT A/P ordered. WBC 19.9 (15.5). Will PAN Culture, Send Lactic Acid, PCT this AM 0.58. and Continue Unasyn. CXR pending. D/C Precedex and Start Propofol as patient is unable to achieve RASS goal. Continue Fentanyl gtt. Attempted to update husband with no answer.

## 2020-05-27 NOTE — Progress Notes (Signed)
Called patient's spouse Jaiana Sheffer to obtain blood consent since patient is intubated, Remo Lipps did not pick up I left my call back number.

## 2020-05-27 NOTE — Progress Notes (Signed)
PROGRESS NOTE    [name]  ZOX:[id number] DOB: 09-May-1951 DOA: 05/15/2020 PCP: Sharilyn Sites, MD    Brief Narrative:  69 y/o female with history of glomerulonephritis on prograf, CKD 4, DM, HTN, unvaccinated for covid, presents to the ED with shortness of breath and found to have covid 19 pneumonia. She was started on IV steroids and supplemental oxygen. Remdesivir was initially started, but then discontinued at family's request over their concerns regarding the impact on her renal function. She was briefly placed on baracitinib, but this was also discontinued due to concerns around developing diverticulitis. Patient has required increasing amounts of oxygen on HHFNC and NRB, but remains hypoxic and tachypneic. Seen by pulmonology and decision made to intubate the patient. Discussed case with PCCM at [facility] who have accepted patient in transfer   Assessment & Plan:   Principal Problem:   Respiratory failure with hypoxia ([place]) Active Problems:   HTN (hypertension)   OSA (obstructive sleep apnea)   DM2 (diabetes mellitus, type 2) (HCC)   Acute respiratory disease due to COVID-19 virus   Pneumonia due to COVID-19 virus   Acute respiratory failure with hypoxia secondary to covid 19 PNA -overall respiratory status has continued to decline -She has been on HHFNC at [place] with NRB as well as Bipap -Despite high oxygen requirements, she continues to be tachypneic and hypoxic -Discussed with Dr. Melvyn Novas and it was felt appropriate to intubate patient -Patient has been on IV steroids since admission -She was initially started on remdesivir, but this was discontinued at family's request due to their concerns around its impact on her renal function -baracitinib started on 10/3, discontinued on 10/5 due to abdominal pain  Abdominal pain -patient has history of diveritculitis, last episode was between 5-8 weeks ago -she is now complaining of LLQ abdominal pain -Started on unasyn for presumed  diverticulitis -Will need CT Abd once airway has been secured - baracitinib has been discontinued  AKI on CKD stage 4 -baseline creatinine 1.8 -creatinine down to 1.7 on 10/4 -patient had hypotension overnight which likely resulted in ATN  -creatinine 2.8 this morning -continue to monitor on urine output -start on gentle IV fluids -she has a history of glomerulonephritis and is chronically on Prograf that was continued  Acute metabolic encephalopathy -suspect related to hypoxia/covid -patient was increasingly agitated and removing oxygen -continue to monitor.  Hyponatremia -related to dehydration -improved with hydration  DM2 -initially blood sugars were elevated, felt to be related to steroids -had episodes of hypoglycemia yesterday -basal insulin discontinued -continue on SSI  HTN.  -chronically on metoprolol and cardizem -will hold for now in light of soft blood pressures  Elevated liver enzymes -currently stable -baracitinib discontinued today, but likely not causing rise in LFTs -continue to monitor  Hypothyroidism -continue on synthroid  HLD -hold crestor in light of elevated LFTs     DVT prophylaxis: heparin injection 5,000 Units Start: 05/01/2020 2315 SCDs Start: 04/28/2020 2312  Code Status: full code Family Communication: discussed with patient's daughter over the phone 10/5 Disposition Plan: Status is: Inpatient  Remains inpatient appropriate because:Inpatient level of care appropriate due to severity of illness   Dispo: The patient is from: Home              Anticipated d/c is to: TBD              Anticipated d/c date is: > 3 days              Patient currently  is not medically stable to d/c.   Consultants:   Pulmonology  Procedures:   ETT 10/5>  Antimicrobials:   Unasyn 10/5>    Subjective: Patient has continued to be short of breath overnight. She has been on bipap/HHFNC and NRB. She admits to pain in the LLQ. Feels very short of  breath  Objective: Vitals:   05/27/20 0530 05/27/20 0600 05/27/20 0630 05/27/20 0700  BP: (!) 99/58 102/62 (!) 86/54 112/60  Pulse: 72 76 71 77  Resp: (!) 29 (!) 32 (!) 31 (!) 32  Temp:      TempSrc:      SpO2: 97% 100% 100% 100%  Weight:      Height:        Intake/Output Summary (Last 24 hours) at 05/27/2020 1325 Last data filed at 05/27/2020 0703 Gross per 24 hour  Intake 301.73 ml  Output 800 ml  Net -498.27 ml   Filed Weights   05/02/2020 1838 05/24/20 0500  Weight: 78.9 kg 70.3 kg    Examination:  General exam: appears uncomfortable, trying to get out of bed and pulling at mask Respiratory system: Clear to auscultation. Short shallow breaths Cardiovascular system: S1 & S2 heard, RRR. No JVD, murmurs, rubs, gallops or clicks. No pedal edema. Gastrointestinal system: Abdomen is tender in LLQ, soft. No organomegaly or masses felt. Normal bowel sounds heard. Central nervous system: No focal neurological deficits. Extremities: Symmetric 5 x 5 power. Skin: No rashes, lesions or ulcers Psychiatry: anxious, confused    Data Reviewed: I have personally reviewed following labs and imaging studies  CBC: Recent Labs  Lab 05/23/20 0540 05/24/20 0446 05/25/20 0444 05/26/20 0439 05/27/20 0557  WBC 5.5 7.6 10.9* 11.9* 15.5*  NEUTROABS 4.6 6.2 9.3* 10.1* 12.8*  HGB 11.1* 9.9* 9.9* 10.3* 8.2*  HCT 32.8* 28.8* 29.4* 30.8* 24.5*  MCV 92.9 93.5 92.7 93.9 95.0  PLT 298 300 322 320 543   Basic Metabolic Panel: Recent Labs  Lab 05/23/20 0540 05/24/20 0446 05/25/20 0444 05/26/20 0439 05/27/20 0557  NA 125* 131* 135 137 137  K 4.7 5.0 4.9 4.9 5.0  CL 98 104 107 104 105  CO2 16* 17* 18* 21* 20*  GLUCOSE 335* 210* 108* 92 149*  BUN 41* 49* 54* 55* 75*  CREATININE 2.01* 1.86* 1.79* 1.78* 2.81*  CALCIUM 8.1* 8.3* 8.7* 8.7* 8.3*  MG 2.1 2.4 2.5* 2.5* 2.8*   GFR: Estimated Creatinine Clearance: 17.2 mL/min (A) (by C-G formula based on SCr of 2.81 mg/dL (H)). Liver  Function Tests: Recent Labs  Lab 05/23/20 0540 05/24/20 0446 05/25/20 0444 05/26/20 0439 05/27/20 0557  AST 34 32 35 178* 72*  ALT _0 52* 47*  ALKPHOS 34* 34* 38 71 67  BILITOT 0.4 0.4 0.3 0.5 0.7  PROT 6.3* 5.7* 6.1* 6.1* 5.7*  ALBUMIN 2.4* 2.2* 2.2* 2.2* 2.6*   No results for input(s): LIPASE, AMYLASE in the last 168 hours. No results for input(s): AMMONIA in the last 168 hours. Coagulation Profile: No results for input(s): INR, PROTIME in the last 168 hours. Cardiac Enzymes: No results for input(s): CKTOTAL, CKMB, CKMBINDEX, TROPONINI in the last 168 hours. BNP (last 3 results) No results for input(s): PROBNP in the last 8760 hours. HbA1C: No results for input(s): HGBA1C in the last 72 hours. CBG: Recent Labs  Lab 05/26/20 1651 05/26/20 1743 05/26/20 2054 05/27/20 0743 05/27/20 1131  GLUCAP 44* 196* 110* 168* 280*   Lipid Profile: No results for input(s): CHOL, HDL, LDLCALC, TRIG, CHOLHDL,  LDLDIRECT in the last 72 hours. Thyroid Function Tests: No results for input(s): TSH, T4TOTAL, FREET4, T3FREE, THYROIDAB in the last 72 hours. Anemia Panel: Recent Labs    05/26/20 0439 05/27/20 0557  FERRITIN 1,987* 1,807*   Sepsis Labs: Recent Labs  Lab 05/16/2020 2011 05/25/20 0444  PROCALCITON 0.33 0.13  LATICACIDVEN 1.3  --     Recent Results (from the past 240 hour(s))  Respiratory Panel by RT PCR (Flu A&B, Covid) - Nasopharyngeal Swab     Status: Abnormal   Collection Time: 05/14/2020  8:29 PM   Specimen: Nasopharyngeal Swab  Result Value Ref Range Status   SARS Coronavirus 2 by RT PCR POSITIVE (A) NEGATIVE Final    Comment: RESULT CALLED TO, READ BACK BY AND VERIFIED WITH: T TALBOT,RN_0  05/08/2020 MKELLY    Influenza A by PCR NEGATIVE NEGATIVE Final   Influenza B by PCR NEGATIVE NEGATIVE Final    Comment: (NOTE) The Xpert Xpress SARS-CoV-2/FLU/RSV assay is intended as an aid in  the diagnosis of influenza from Nasopharyngeal swab specimens and   should not be used as a sole basis for treatment. Nasal washings and  aspirates are unacceptable for Xpert Xpress SARS-CoV-2/FLU/RSV  testing.  Fact Sheet for Patients: PinkCheek.be  Fact Sheet for Healthcare Providers: GravelBags.it  This test is not yet approved or cleared by the [place] FDA and  has been authorized for detection and/or diagnosis of SARS-CoV-2 by  FDA under an Emergency Use Authorization (EUA). This EUA will remain  in effect (meaning this test can be used) for the duration of the  Covid-19 declaration under Section 564(b)(1) of the Act, 21  U.S.C. section 360bbb-3(b)(1), unless the authorization is  terminated or revoked. Performed at [facility], [address], [place], [place] [address]   MRSA PCR Screening     Status: None   Collection Time: 05/23/20 10:23 PM   Specimen: Nasopharyngeal  Result Value Ref Range Status   MRSA by PCR NEGATIVE NEGATIVE Final    Comment:        The GeneXpert MRSA Assay (FDA approved for NASAL specimens only), is one component of a comprehensive MRSA colonization surveillance program. It is not intended to diagnose MRSA infection nor to guide or monitor treatment for MRSA infections. Performed at [facility], [address] [place], [place] [address]          Radiology Studies: No results found.      Scheduled Meds: . vitamin C  500 mg Oral Daily  . aspirin EC  81 mg Oral Daily  . chlorhexidine gluconate (MEDLINE KIT)  15 mL Mouth Rinse BID  . Chlorhexidine Gluconate Cloth  6 each Topical Daily  . dextromethorphan-guaiFENesin  1 tablet Oral BID  . docusate  100 mg Per Tube BID  . fenofibrate  160 mg Oral Daily  . fentaNYL (SUBLIMAZE) injection  25 mcg Intravenous Once  . heparin  5,000 Units Subcutaneous Q8H  . insulin aspart  0-15 Units Subcutaneous TID WC  . insulin aspart  0-5 Units Subcutaneous QHS  . Ipratropium-Albuterol  1 puff  Inhalation Q6H  . levothyroxine  175 mcg Oral Daily  . mouth rinse  15 mL Mouth Rinse 10 times per day  . methylPREDNISolone (SOLU-MEDROL) injection  40 mg Intravenous Q12H  . multivitamin with minerals  1 tablet Oral Daily  . pantoprazole  40 mg Oral Daily  . polyethylene glycol  17 g Per Tube Daily  . rOPINIRole  1 mg Oral QHS  . rosuvastatin  40 mg Oral QHS  . sodium bicarbonate  1,300 mg Oral BID  . tacrolimus  2 mg Oral BID  . zinc sulfate  220 mg Oral Daily   Continuous Infusions: . sodium chloride 100 mL/hr at 05/27/20 1100  . ampicillin-sulbactam (UNASYN) IV    . dexmedetomidine (PRECEDEX) IV infusion 0.4 mcg/kg/hr (05/27/20 1058)  . fentaNYL infusion INTRAVENOUS    . propofol       LOS: 5 days    Critical care procedure note Authorized and performed by: Kathie Dike Total critical care time: Approximately 35 minutes Due to high probability of clinically significant, life-threatening deterioration, the patient required my highest level of preparedness to intervene emergently and I personally spent this critical care time directly and personally managing the patient.  The critical care time included obtaining a history, examining the patient, pulse oximetry, ordering and review of studies, arranging urgent treatment with development of a management plan, evaluation of patient's response to treatment, frequent reassessment, discussions with other providers.  Critical care time was performed to assess and manage the high probability of imminent, life-threatening deterioration that could result in multiorgan failure.  It was exclusive of separate billable procedures and treating other patients and teaching time.  Please see MDM section and the rest of the of note for further information on patient assessment and treatment     Kathie Dike, MD [facility]   If 7PM-7AM, please contact night-coverage [email or web address]  05/27/2020, 1:25 PM

## 2020-05-27 NOTE — Anesthesia Postprocedure Evaluation (Signed)
Anesthesia Post Note  Patient: [name]  Procedure(s) Performed: AN AD HOC INTUBATION  Patient location during evaluation: ICU Anesthesia Type: General Level of consciousness: patient remains intubated per anesthesia plan Anesthetic complications: no   No complications documented.   Last Vitals:  Vitals:   05/27/20 0700 05/27/20 1331  BP: 112/60   Pulse: 77   Resp: (!) 32 (!) 33  Temp:    SpO2: 100%     Last Pain:  Vitals:   05/27/20 0300  TempSrc: Axillary  PainSc:                  Louann Sjogren

## 2020-05-27 NOTE — Progress Notes (Signed)
Daughter called and requested that patients phone and charger be taken to husband who is being admitted through the ED for [place]. Daughter also wanted patient glasses taken but we were afraid they would get lost if not kept with Wife. All belonging were placed in a bag and will be sent with patient to [facility].

## 2020-05-27 NOTE — Progress Notes (Signed)
[facility]  CVL in the lower SVC. No PTX. Ok to use.  Katherine Labrum, MD [facility] [address] [place], [place] [address] [phone] (office)

## 2020-05-27 NOTE — Consult Note (Addendum)
NAME:  [name], MRN:  [id number], DOB:  Dec 10, 1950, LOS: 5 ADMISSION DATE:  05/05/2020, CONSULTATION DATE:  10/5 REFERRING MD: Memon/ Triad, CHIEF COMPLAINT:  resp distress    Brief History   42 yowf unvaccinated   History of present illness    34 yowf , with history of T2DM, OSA, Hothyroidism, HTN, HLD, hepatitis, and GERD presents to the ED with a chief complaint of dyspnea.  Patient reports that symptom onset was 5 weeks ago, and started with diverticulitis.  Patient reports that since then she has had dyspnea and cough that started simultaneously.  The dyspnea and cough became worse over the last 2 weeks.  Patient reports that dyspnea is worse with exertion, and she can even walk now.  She reports that she fell on 9/29, which she attributes to being so weak.  She describes the fall as she was sitting in her chair got up took 2 steps and ended up on the ground.  She does not remember feeling lightheaded, or any preceding symptoms, and she did not hit her head.  Patient reports that she is weak because she has had decreased appetite and decreased p.o. intake since the diverticulitis 5 weeks pta.  Patient reports that her taste and smell are intact.  She does not have chest pain or palpitations.  She reports that her cough is a dry cough.  She had fevers for the first 3 weeks of her symptoms, but not for the last 2 weeks.  She reports that her T-max was 103.1, and Tylenol helps when she remembers to take it.  Patient has not had body aches, nausea vomiting, diarrhea, dysuria.  She does admit to shakiness, and vision changes over the last 2 weeks.  She reports that she cannot focus her eyes, so is blurry vision, but not double vision.  She does not have a sore throat and is not has clogged sinuses.  Patient did not get a Covid vaccine, because "I did not want 1."  Patient is refusing remdesivir today because her daughter is a "conspiracy therapist" and does not want her taking it.     Past Medical  History  DM  II OSA Hypothyroid HBP HLD GERD   Significant Hospital Events      Consults:  PCCM  10/5  Procedures:  Oral ET  10/5  L IJ CVL  10/5   Significant Diagnostic Tests:    Micro Data:  Covid 19  PCR  9/30  POS MRSA PCR  10/1 >   Neg   ET 10/5 >>>  ID Rx:  Baricitinib   10/3 >>> Unasyn       10/5 >>>  Scheduled Meds: . vitamin C  500 mg Oral Daily  . aspirin EC  81 mg Oral Daily  . chlorhexidine gluconate (MEDLINE KIT)  15 mL Mouth Rinse BID  . Chlorhexidine Gluconate Cloth  6 each Topical Daily  . dextromethorphan-guaiFENesin  1 tablet Oral BID  . docusate  100 mg Per Tube BID  . fenofibrate  160 mg Oral Daily  . fentaNYL (SUBLIMAZE) injection  25 mcg Intravenous Once  . heparin  5,000 Units Subcutaneous Q8H  . insulin aspart  0-15 Units Subcutaneous TID WC  . insulin aspart  0-5 Units Subcutaneous QHS  . Ipratropium-Albuterol  1 puff Inhalation Q6H  . levothyroxine  175 mcg Oral Daily  . mouth rinse  15 mL Mouth Rinse 10 times per day  . methylPREDNISolone (SOLU-MEDROL) injection  40 mg  Intravenous Q12H  . multivitamin with minerals  1 tablet Oral Daily  . pantoprazole  40 mg Oral Daily  . polyethylene glycol  17 g Per Tube Daily  . rOPINIRole  1 mg Oral QHS  . sodium bicarbonate  1,300 mg Oral BID  . tacrolimus  2 mg Oral BID  . zinc sulfate  220 mg Oral Daily   Continuous Infusions: . sodium chloride 100 mL/hr at 05/27/20 1100  . ampicillin-sulbactam (UNASYN) IV    . dexmedetomidine (PRECEDEX) IV infusion 0.4 mcg/kg/hr (05/27/20 1058)  . fentaNYL infusion INTRAVENOUS 25 mcg/hr (05/27/20 1334)   PRN Meds:.acetaminophen, chlorpheniramine-HYDROcodone, fentaNYL, guaiFENesin-dextromethorphan, LORazepam, midazolam, midazolam, morphine injection, ondansetron **OR** ondansetron (ZOFRAN) IV, oxyCODONE  Interim history/subjective:  Sedated on vent   Objective   Blood pressure 112/60, pulse 77, temperature (!) 97.1 F (36.2 C), temperature source  Axillary, resp. rate (!) 33, height '5\' 1"'  (1.549 m), weight 70.3 kg, SpO2 92 %.    Vent Mode: PRVC FiO2 (%):  [80 %-100 %] 100 % Set Rate:  [25 bmp] 25 bmp Vt Set:  [290 mL] 290 mL Plateau Pressure:  [26 cmH20] 26 cmH20   Intake/Output Summary (Last 24 hours) at 05/27/2020 1348 Last data filed at 05/27/2020 0703 Gross per 24 hour  Intake 301.73 ml  Output 800 ml  Net -498.27 ml   Filed Weights   05/14/2020 1838 05/24/20 0500  Weight: 78.9 kg 70.3 kg    Examination: Tmax   97.3  General:elderly wf sedated on vent  HENT: oral et  Lungs: diffuse distant insp crackles  Cardiovascular: RRR no s3 Abdomen: soft/ benign, OG in place  Extremities: warm no edema Neuro: sedated      I personally reviewed images and agree with radiology impression as follows:  CXR:   Portable 10/5 1. Left central line with tip in the region of the lower SVC. No pneumothorax. 2. Endotracheal tube and enteric tube remain in place. 3. Unchanged bilateral heterogeneous airspace opacities consistent with COVID pneumonia.  Resolved Hospital Problem list      Assessment & Plan:  1) Acute hypoxemic resp failure secondary to Covid 19 Pna  >>> ARDS protocol for fio2/peep titration / continue steroids and Baricitinib per covid protocol   2) Abd pain ? Diverticulitis flare ? With sepsis (hard to sort out latter with covid infection concomitant but very strongly favor covid with non-septic abd though hard to distinguish  >>> agree with unsyn, check pct consider ct abd    3) hypotension likely relatively vol depleted  >> already fluid bolused, check cvp , PCT protocol  >> recheck cbc p fluid bolus   4) Anemia,? Blood loss >> recheck cbc p fluid bolus >> threshold for tx at < 7 gm    5) acute on chronic renal insuff with baseline about 1.8 creat Lab Results  Component Value Date   CREATININE 2.81 (H) 05/27/2020   CREATININE 1.78 (H) 05/26/2020   CREATININE 1.79 (H) 05/25/2020    >> foley for uop/  keep hydrated / monitor cvp for adequate intravasc volume   The patient is critically ill with multiple organ systems failure and requires high complexity decision making for assessment and support, frequent evaluation and titration of therapies, application of advanced monitoring technologies and extensive interpretation of multiple databases. Critical Care Time devoted to patient care services described in this note is 45 minutes.    Christinia Gully, MD Pulmonary and [place] [phone]   After 7:00 pm call  [facility]  [phone]   Best practice:  Diet: npo Pain/Anxiety/Delirium protocol (if indicated):  Precedex/ fentanyl VAP protocol (if indicated):  DVT prophylaxis: hep sq GI prophylaxis: ppi Glucose control: per triad Mobility: bed rest  Code Status: full Family Communication: per Triad Disposition: ICU >  Transfer to [facility] planned per [facility]   Labs   CBC: Recent Labs  Lab 05/23/20 0540 05/24/20 0446 05/25/20 0444 05/26/20 0439 05/27/20 0557  WBC 5.5 7.6 10.9* 11.9* 15.5*  NEUTROABS 4.6 6.2 9.3* 10.1* 12.8*  HGB 11.1* 9.9* 9.9* 10.3* 8.2*  HCT 32.8* 28.8* 29.4* 30.8* 24.5*  MCV 92.9 93.5 92.7 93.9 95.0  PLT 298 300 322 320 268    Basic Metabolic Panel: Recent Labs  Lab 05/23/20 0540 05/24/20 0446 05/25/20 0444 05/26/20 0439 05/27/20 0557  NA 125* 131* 135 137 137  K 4.7 5.0 4.9 4.9 5.0  CL 98 104 107 104 105  CO2 16* 17* 18* 21* 20*  GLUCOSE 335* 210* 108* 92 149*  BUN 41* 49* 54* 55* 75*  CREATININE 2.01* 1.86* 1.79* 1.78* 2.81*  CALCIUM 8.1* 8.3* 8.7* 8.7* 8.3*  MG 2.1 2.4 2.5* 2.5* 2.8*   GFR: Estimated Creatinine Clearance: 17.2 mL/min (A) (by C-G formula based on SCr of 2.81 mg/dL (H)). Recent Labs  Lab 05/13/2020 2011 05/23/20 0540 05/24/20 0446 05/25/20 0444 05/26/20 0439 05/27/20 0557  PROCALCITON 0.33  --   --  0.13  --   --   WBC 7.5   < > 7.6 10.9* 11.9* 15.5*  LATICACIDVEN 1.3  --   --   --   --    --    < > = values in this interval not displayed.    Liver Function Tests: Recent Labs  Lab 05/23/20 0540 05/24/20 0446 05/25/20 0444 05/26/20 0439 05/27/20 0557  AST 34 32 35 178* 72*  ALT '17 14 14 ' 52* 47*  ALKPHOS 34* 34* 38 71 67  BILITOT 0.4 0.4 0.3 0.5 0.7  PROT 6.3* 5.7* 6.1* 6.1* 5.7*  ALBUMIN 2.4* 2.2* 2.2* 2.2* 2.6*   No results for input(s): LIPASE, AMYLASE in the last 168 hours. No results for input(s): AMMONIA in the last 168 hours.  ABG    Component Value Date/Time   PHART 7.468 (H) 05/27/2020 1215   PCO2ART <19.0 (LL) 05/27/2020 1215   PO2ART 86.0 05/27/2020 1215   HCO3 16.8 (L) 05/27/2020 1215   ACIDBASEDEF 9.8 (H) 05/27/2020 1215   O2SAT 96.5 05/27/2020 1215     Coagulation Profile: No results for input(s): INR, PROTIME in the last 168 hours.  Cardiac Enzymes: No results for input(s): CKTOTAL, CKMB, CKMBINDEX, TROPONINI in the last 168 hours.  HbA1C: Hgb A1c MFr Bld  Date/Time Value Ref Range Status  05/13/2020 08:11 PM 9.1 (H) 4.8 - 5.6 % Final    Comment:    (NOTE) Pre diabetes:          5.7%-6.4%  Diabetes:              >6.4%  Glycemic control for   <7.0% adults with diabetes     CBG: Recent Labs  Lab 05/26/20 1651 05/26/20 1743 05/26/20 2054 05/27/20 0743 05/27/20 1131  GLUCAP 44* 196* 110* 168* 280*       Past Medical History  She,  has a past medical history of Anemia, Anginal pain ([place]), Arthritis, Carotid stenosis (~ 01/2013), Dysrhythmia, Exertional shortness of breath, GERD (gastroesophageal reflux disease), Gout, Headache(784.0), Heart murmur, Hepatitis, unspecified (1972), High cholesterol, Hypertension, Hypothyroidism, Membranous  glomerulonephritis, Pneumonia (?1980's; 09/2011), Sleep apnea, and Type II diabetes mellitus ([place]).   Surgical History    Past Surgical History:  Procedure Laterality Date  . BUNIONECTOMY Bilateral [id number]   "once on each side" (06/18/2013)  . CESAREAN SECTION  1971; 1977  .  CHOLECYSTECTOMY  ?1998  . COLONOSCOPY N/A 12/03/2015   Procedure: COLONOSCOPY;  Surgeon: Daneil Dolin, MD;  Location: [facility];  Service: Endoscopy;  Laterality: N/A;  8:30 AM  . RENAL BIOPSY Left ~ 1997; 06/18/2013  . SHOULDER ARTHROSCOPY W/ ROTATOR CUFF REPAIR Right 2009  . TONSILLECTOMY       Social History   reports that she has never smoked. She has never used smokeless tobacco. She reports that she does not drink alcohol and does not use drugs.   Family History   Her family history includes Cirrhosis (age of onset: 32) in her mother; Coronary artery disease in her father.   Allergies Allergies  Allergen Reactions  . Codeine Nausea And Vomiting  . Ibuprofen Other (See Comments)    Per kidney MD.  . Nsaids Other (See Comments)    Per Kidney DR. No NSAIDS  . Shellfish Allergy Other (See Comments)    REACTION: Gout Flare-ups   . Septra Ds [Sulfamethoxazole-Trimethoprim] Itching and Rash     Home Medications  Prior to Admission medications   Medication Sig Start Date End Date Taking? Authorizing Provider  allopurinol (ZYLOPRIM) 300 MG tablet Take 300 mg by mouth daily.   Yes [provider]  aspirin 81 MG tablet Take 81 mg by mouth daily.   Yes [provider]  cetirizine (ZYRTEC) 10 MG tablet Take 10 mg by mouth at bedtime.   Yes [provider]  ciprofloxacin (CIPRO) 500 MG tablet Take 500 mg by mouth 2 (two) times daily. 05/12/20  Yes [provider]  diltiazem (CARDIZEM CD) 240 MG 24 hr capsule Take 240 mg by mouth at bedtime.   Yes [provider]  Esomeprazole Magnesium (NEXIUM PO) Take 40 mg by mouth daily.    Yes [provider]  fenofibrate (TRICOR) 145 MG tablet Take 145 mg by mouth at bedtime.    Yes [provider]  furosemide (LASIX) 40 MG tablet Take 2 tablets (80 mg total) by mouth daily. Patient taking differently: Take 40 mg by mouth daily.  10/26/18  Yes Aline August, MD  insulin aspart  (NOVOLOG) 100 UNIT/ML injection Inject 3-11 Units into the skin 3 (three) times daily before meals. On sliding scale   Yes [provider]  insulin detemir (LEVEMIR) 100 UNIT/ML injection Inject 0.1 mLs (10 Units total) into the skin daily. 25 Units AM; 10 units PM Patient taking differently: Inject 10-25 Units into the skin daily. 30 Units AM; 35 units PM 10/19/18  Yes Alekh, Kshitiz, MD  metoprolol succinate (TOPROL-XL) 100 MG 24 hr tablet TAKE 1 TABLET BY MOUTH EVERY DAY WITH FOOD Patient taking differently: Take 100 mg by mouth daily.  10/03/18  Yes Troy Sine, MD  ondansetron (ZOFRAN ODT) 4 MG disintegrating tablet Take 1 tablet (4 mg total) by mouth every 8 (eight) hours as needed for nausea or vomiting. 10/19/18  Yes Aline August, MD  rOPINIRole (REQUIP) 0.5 MG tablet Take 1 mg by mouth at bedtime.    Yes [provider]  rosuvastatin (CRESTOR) 40 MG tablet Take 40 mg by mouth at bedtime.   Yes [provider]  sodium bicarbonate 650 MG tablet Take 1,300 mg by mouth  in the morning and at bedtime.   Yes [provider]  SYNTHROID 175 MCG tablet Take 175 mcg by mouth daily.  05/15/15  Yes [provider]  tacrolimus (PROGRAF) 1 MG capsule Take 2 mg by mouth in the morning and at bedtime.   Yes [provider]  telmisartan (MICARDIS) 80 MG tablet Take 40 mg by mouth at bedtime.   Yes [provider]  nitroGLYCERIN (NITROSTAT) 0.4 MG SL tablet PLACE 1 TABLET (0.4 MG TOTAL) UNDER THE TONGUE EVERY 5 (FIVE) MINUTES AS NEEDED FOR CHEST PAIN. 09/04/18 12/03/18  Troy Sine, MD

## 2020-05-27 NOTE — Progress Notes (Signed)
[facility] Progress Note Patient Name: [name] DOB: 22-Feb-1951 MRN: [id number]   Date of Service  05/27/2020  HPI/Events of Note  Anemia - Hgb = 5.1.   eICU Interventions  Will transfuse 2 units PRBC now.      Intervention Category Major Interventions: Other:  Lysle Dingwall 05/27/2020, 9:44 PM

## 2020-05-28 ENCOUNTER — Inpatient Hospital Stay (HOSPITAL_COMMUNITY): Payer: Medicare HMO

## 2020-05-28 DIAGNOSIS — J9601 Acute respiratory failure with hypoxia: Secondary | ICD-10-CM

## 2020-05-28 DIAGNOSIS — U071 COVID-19: Secondary | ICD-10-CM | POA: Diagnosis not present

## 2020-05-28 DIAGNOSIS — J069 Acute upper respiratory infection, unspecified: Secondary | ICD-10-CM

## 2020-05-28 LAB — CBC
HCT: 30.6 % — ABNORMAL LOW (ref 36.0–46.0)
HCT: 30.1 % — ABNORMAL LOW (ref 36.0–46.0)
HCT: 28.2 % — ABNORMAL LOW (ref 36.0–46.0)
HCT: 26.3 % — ABNORMAL LOW (ref 36.0–46.0)
Hemoglobin: 10.5 g/dL — ABNORMAL LOW (ref 12.0–15.0)
Hemoglobin: 10.2 g/dL — ABNORMAL LOW (ref 12.0–15.0)
Hemoglobin: 9.5 g/dL — ABNORMAL LOW (ref 12.0–15.0)
Hemoglobin: 8.9 g/dL — ABNORMAL LOW (ref 12.0–15.0)
MCH: 31.2 pg (ref 26.0–34.0)
MCH: 30.4 pg (ref 26.0–34.0)
MCH: 30.2 pg (ref 26.0–34.0)
MCH: 30.2 pg (ref 26.0–34.0)
MCHC: 34.3 g/dL (ref 30.0–36.0)
MCHC: 33.9 g/dL (ref 30.0–36.0)
MCHC: 33.7 g/dL (ref 30.0–36.0)
MCHC: 33.8 g/dL (ref 30.0–36.0)
MCV: 90.8 fL (ref 80.0–100.0)
MCV: 89.9 fL (ref 80.0–100.0)
MCV: 89.5 fL (ref 80.0–100.0)
MCV: 89.2 fL (ref 80.0–100.0)
Platelets: 298 10*3/uL (ref 150–400)
Platelets: 317 10*3/uL (ref 150–400)
Platelets: 301 10*3/uL (ref 150–400)
Platelets: 246 10*3/uL (ref 150–400)
RBC: 3.37 MIL/uL — ABNORMAL LOW (ref 3.87–5.11)
RBC: 3.35 MIL/uL — ABNORMAL LOW (ref 3.87–5.11)
RBC: 3.15 MIL/uL — ABNORMAL LOW (ref 3.87–5.11)
RBC: 2.95 MIL/uL — ABNORMAL LOW (ref 3.87–5.11)
RDW: 14.5 % (ref 11.5–15.5)
RDW: 15 % (ref 11.5–15.5)
RDW: 15.5 % (ref 11.5–15.5)
RDW: 15.5 % (ref 11.5–15.5)
WBC: 19.3 10*3/uL — ABNORMAL HIGH (ref 4.0–10.5)
WBC: 21.7 10*3/uL — ABNORMAL HIGH (ref 4.0–10.5)
WBC: 20.5 10*3/uL — ABNORMAL HIGH (ref 4.0–10.5)
WBC: 18.8 10*3/uL — ABNORMAL HIGH (ref 4.0–10.5)
nRBC: 0 % (ref 0.0–0.2)
nRBC: 0.1 % (ref 0.0–0.2)
nRBC: 0 % (ref 0.0–0.2)
nRBC: 0 % (ref 0.0–0.2)

## 2020-05-28 LAB — URINALYSIS, ROUTINE W REFLEX MICROSCOPIC
Bilirubin Urine: NEGATIVE
Glucose, UA: 150 mg/dL — AB
Ketones, ur: 5 mg/dL — AB
Leukocytes,Ua: NEGATIVE
Nitrite: NEGATIVE
Protein, ur: 100 mg/dL — AB
Specific Gravity, Urine: 1.014 (ref 1.005–1.030)
pH: 5 (ref 5.0–8.0)

## 2020-05-28 LAB — BPAM RBC
Blood Product Expiration Date: 202110292359
Blood Product Expiration Date: 202110292359
ISSUE DATE / TIME: 202110052157
ISSUE DATE / TIME: 202110052350
Unit Type and Rh: 7300
Unit Type and Rh: 7300

## 2020-05-28 LAB — PHOSPHORUS
Phosphorus: 8.6 mg/dL — ABNORMAL HIGH (ref 2.5–4.6)
Phosphorus: 8.3 mg/dL — ABNORMAL HIGH (ref 2.5–4.6)
Phosphorus: 7.9 mg/dL — ABNORMAL HIGH (ref 2.5–4.6)

## 2020-05-28 LAB — TYPE AND SCREEN
ABO/RH(D): B POS
Antibody Screen: NEGATIVE
Unit division: 0
Unit division: 0

## 2020-05-28 LAB — GLUCOSE, CAPILLARY
Glucose-Capillary: 161 mg/dL — ABNORMAL HIGH (ref 70–99)
Glucose-Capillary: 169 mg/dL — ABNORMAL HIGH (ref 70–99)
Glucose-Capillary: 201 mg/dL — ABNORMAL HIGH (ref 70–99)
Glucose-Capillary: 237 mg/dL — ABNORMAL HIGH (ref 70–99)
Glucose-Capillary: 237 mg/dL — ABNORMAL HIGH (ref 70–99)

## 2020-05-28 LAB — BASIC METABOLIC PANEL
Anion gap: 14 (ref 5–15)
BUN: 91 mg/dL — ABNORMAL HIGH (ref 8–23)
CO2: 16 mmol/L — ABNORMAL LOW (ref 22–32)
Calcium: 7.3 mg/dL — ABNORMAL LOW (ref 8.9–10.3)
Chloride: 111 mmol/L (ref 98–111)
Creatinine, Ser: 4.2 mg/dL — ABNORMAL HIGH (ref 0.44–1.00)
GFR calc non Af Amer: 10 mL/min — ABNORMAL LOW (ref >60)
Glucose, Bld: 179 mg/dL — ABNORMAL HIGH (ref 70–99)
Potassium: 5.2 mmol/L — ABNORMAL HIGH (ref 3.5–5.1)
Sodium: 141 mmol/L (ref 135–145)

## 2020-05-28 LAB — PROTEIN / CREATININE RATIO, URINE
Creatinine, Urine: 56.04 mg/dL
Protein Creatinine Ratio: 1.95 mg/mg{Cre} — ABNORMAL HIGH (ref 0.00–0.15)
Total Protein, Urine: 109 mg/dL

## 2020-05-28 LAB — MAGNESIUM
Magnesium: 2.8 mg/dL — ABNORMAL HIGH (ref 1.7–2.4)
Magnesium: 2.7 mg/dL — ABNORMAL HIGH (ref 1.7–2.4)
Magnesium: 2.7 mg/dL — ABNORMAL HIGH (ref 1.7–2.4)

## 2020-05-28 LAB — TRIGLYCERIDES: Triglycerides: 389 mg/dL — ABNORMAL HIGH (ref <150)

## 2020-05-28 LAB — LACTIC ACID, PLASMA: Lactic Acid, Venous: 1.6 mmol/L (ref 0.5–1.9)

## 2020-05-28 MED ORDER — SODIUM BICARBONATE 650 MG PO TABS
1300.0000 mg | ORAL_TABLET | Freq: Two times a day (BID) | ORAL | Status: DC
  Administered 2020-05-28 – 2020-05-29 (×4): 1300 mg via ORAL
  Filled 2020-05-28 (×4): qty 2

## 2020-05-28 MED ORDER — VITAL HIGH PROTEIN PO LIQD: 1000.0000 mL | ORAL | Status: DC

## 2020-05-28 MED ORDER — LINAGLIPTIN 5 MG PO TABS
5.0000 mg | ORAL_TABLET | Freq: Every day | ORAL | Status: DC
  Administered 2020-05-28 – 2020-06-14 (×17): 5 mg
  Filled 2020-05-28 (×18): qty 1

## 2020-05-28 MED ORDER — SODIUM CHLORIDE 0.9% FLUSH: 10.0000 mL | INTRAVENOUS | Status: DC | PRN

## 2020-05-28 MED ORDER — SODIUM ZIRCONIUM CYCLOSILICATE 10 G PO PACK
10.0000 g | PACK | Freq: Two times a day (BID) | ORAL | Status: AC
  Administered 2020-05-28 (×2): 10 g via ORAL
  Filled 2020-05-28 (×2): qty 1

## 2020-05-28 MED ORDER — PROSOURCE TF PO LIQD
45.0000 mL | Freq: Three times a day (TID) | ORAL | Status: DC
  Administered 2020-05-28 – 2020-06-02 (×15): 45 mL
  Filled 2020-05-28 (×14): qty 45

## 2020-05-28 MED ORDER — VITAL AF 1.2 CAL PO LIQD
1000.0000 mL | ORAL | Status: DC
  Administered 2020-05-28 – 2020-06-01 (×3): 1000 mL

## 2020-05-28 MED ORDER — SODIUM CHLORIDE 0.9% FLUSH
10.0000 mL | Freq: Two times a day (BID) | INTRAVENOUS | Status: DC
  Administered 2020-05-28 – 2020-06-14 (×35): 10 mL

## 2020-05-28 MED ORDER — PROSOURCE TF PO LIQD: 45.0000 mL | Freq: Two times a day (BID) | ORAL | Status: DC

## 2020-05-28 MED ORDER — PANTOPRAZOLE SODIUM 40 MG PO PACK
40.0000 mg | PACK | Freq: Every day | ORAL | Status: DC
  Administered 2020-05-28 – 2020-06-14 (×17): 40 mg
  Filled 2020-05-28 (×19): qty 20

## 2020-05-28 NOTE — Progress Notes (Signed)
Pt transported to ct and back with no complications. 

## 2020-05-28 NOTE — Progress Notes (Addendum)
Nutrition Follow-up / Consult  DOCUMENTATION CODES:   Not applicable  INTERVENTION:   Initiate tube feeding via OG tube: Vital AF 1.2 at 45 ml/h (1080 ml per day) Prosource TF 45 ml TID  Provides 1416 kcal (1638 kcal total with propofol), 114 gm protein, 876 ml free water daily  NUTRITION DIAGNOSIS:   Inadequate oral intake related to inability to eat as evidenced by NPO status.  Ongoing   GOAL:   Patient will meet greater than or equal to 90% of their needs  Unmet  MONITOR:   Weight trends, Vent status, Labs, I & O's, TF tolerance  REASON FOR ASSESSMENT:   Consult Enteral/tube feeding initiation and management  ASSESSMENT:   69 yo female admitted to [facility] on 9/30 with 2 week hx of worsening dyspnea, cough, weakness, and decreased appetite. COVID positive. Intubated 10/5 and transferred to [facility] for treatment of COVID PNA. PMH includes DM-2, glomerulonephritis, OSA, hypothyroidism, HLD, hepatitis, GERD, diverticulitis.  Patient has been eating poorly for 5 weeks since the beginning of her diverticulitis symptoms. Received MD Consult for TF initiation and management. OG tube is in place. May require dialysis.  Patient is currently intubated on ventilator support. MV: 10 L/min Temp (24hrs), Avg:98.5 F (36.9 C), Min:97.7 F (36.5 C), Max:99.5 F (37.5 C)  Propofol: 8.4 ml/hr providing 222 kcal from lipid.  Labs reviewed. K 5.2, BUN 91, Creatinine 4.20, phosphorus 8.6 CBG: 161-169  Medications reviewed and include colace, novolog, tradjenta, solumedrol, lokelma, levophed, propofol.  No new weight available.  Diet Order:   Diet Order            Diet NPO time specified  Diet effective now                 EDUCATION NEEDS:   No education needs have been identified at this time  Skin:  Skin Assessment: Reviewed RN Assessment  Last BM:  10/04  Height:   Ht Readings from Last 1 Encounters:  05/28/20 5\' 1"  (1.549 m)    Weight:   Wt Readings from  Last 1 Encounters:  05/24/20 70.3 kg    Ideal Body Weight:  47.7 kg  BMI:  Body mass index is 29.28 kg/m.  Estimated Nutritional Needs:   Kcal:  1200-1450  Protein:  105-140 gm  Fluid:  >/= 2 L/day    Lucas Mallow, RD, LDN, CNSC Please refer to Amion for contact information.

## 2020-05-28 NOTE — Progress Notes (Addendum)
NAME:  [name], MRN:  [id number], DOB:  1951/05/17, LOS: 6 ADMISSION DATE:  05/16/2020, CONSULTATION DATE:  10/5 REFERRING MD: Memon/ Triad, CHIEF COMPLAINT:  resp distress    Brief History   75 yowf unvaccinated   History of present illness    61 yowf , with history of T2DM, OSA, Hothyroidism, HTN, HLD, hepatitis, and GERD presents to the ED with a chief complaint of dyspnea.  Patient reports that symptom onset was 5 weeks ago, and started with diverticulitis.  Patient reports that since then she has had dyspnea and cough that started simultaneously.  The dyspnea and cough became worse over the last 2 weeks.  Patient reports that dyspnea is worse with exertion, and she can even walk now.  She reports that she fell on 9/29, which she attributes to being so weak.  She describes the fall as she was sitting in her chair got up took 2 steps and ended up on the ground.  She does not remember feeling lightheaded, or any preceding symptoms, and she did not hit her head.  Patient reports that she is weak because she has had decreased appetite and decreased p.o. intake since the diverticulitis 5 weeks pta.  Patient reports that her taste and smell are intact.  She does not have chest pain or palpitations.  She reports that her cough is a dry cough.  She had fevers for the first 3 weeks of her symptoms, but not for the last 2 weeks.  She reports that her T-max was 103.1, and Tylenol helps when she remembers to take it.  Patient has not had body aches, nausea vomiting, diarrhea, dysuria.  She does admit to shakiness, and vision changes over the last 2 weeks.  She reports that she cannot focus her eyes, so is blurry vision, but not double vision.  She does not have a sore throat and is not has clogged sinuses.  Patient did not get a Covid vaccine, because "I did not want 1."  Patient is refusing remdesivir today because her daughter is a "conspiracy therapist" and does not want her taking it.     Past Medical  History  DM  II OSA Hypothyroid HBP HLD GERD   Significant Hospital Events      Consults:  PCCM  10/5  Procedures:  Oral ET  10/5  L IJ CVL  10/5   Significant Diagnostic Tests:    Micro Data:  Covid 19  PCR  9/30  POS MRSA PCR  10/1 >   Neg   ET 10/5 >>>  ID Rx:  Baricitinib   10/3 >>>   Scheduled Meds: . sodium chloride   Intravenous Once  . chlorhexidine gluconate (MEDLINE KIT)  15 mL Mouth Rinse BID  . Chlorhexidine Gluconate Cloth  6 each Topical Daily  . docusate  100 mg Per Tube BID  . insulin aspart  0-20 Units Subcutaneous Q4H  . levothyroxine  175 mcg Per Tube Q0600  . mouth rinse  15 mL Mouth Rinse 10 times per day  . methylPREDNISolone (SOLU-MEDROL) injection  40 mg Intravenous Q12H  . pantoprazole (PROTONIX) IV  40 mg Intravenous Q24H  . sodium chloride flush  10-40 mL Intracatheter Q12H  . tacrolimus  1 mg Sublingual BID   Continuous Infusions: . sodium chloride    . ampicillin-sulbactam (UNASYN) IV Stopped (05/28/20 0154)  . fentaNYL infusion INTRAVENOUS 250 mcg/hr (05/28/20 0600)  . norepinephrine (LEVOPHED) Adult infusion 10 mcg/min (05/28/20 0600)  . propofol (  DIPRIVAN) infusion 20 mcg/kg/min (05/28/20 0753)   PRN Meds:.acetaminophen, fentaNYL, midazolam, [DISCONTINUED] ondansetron **OR** ondansetron (ZOFRAN) IV, sodium chloride flush  Interim history/subjective:  Sedation weans result in dysynchrony and desaturations.  Objective   Blood pressure 117/60, pulse 71, temperature 98.6 F (37 C), temperature source Axillary, resp. rate 13, height '5\' 1"'  (1.549 m), weight 70.3 kg, SpO2 96 %. CVP:  [9 mmHg-11 mmHg] 10 mmHg  Vent Mode: CPAP;PSV FiO2 (%):  [50 %-100 %] 60 % Set Rate:  [16 bmp-25 bmp] 16 bmp Vt Set:  [290 mL-300 mL] 300 mL PEEP:  [8 cmH20-10 cmH20] 8 cmH20 Pressure Support:  [8 cmH20] 8 cmH20 Plateau Pressure:  [24 cmH20-26 cmH20] 24 cmH20   Intake/Output Summary (Last 24 hours) at 05/28/2020 1044 Last data filed at 05/28/2020  0600 Gross per 24 hour  Intake 3491.7 ml  Output 515 ml  Net 2976.7 ml   Filed Weights   05/02/2020 1838 05/24/20 0500  Weight: 78.9 kg 70.3 kg   Constitutional: chronically ill woman in no acute distress Eyes: eyes are anicteric, reactive to light Ears, nose, mouth, and throat: mucous membranes moist, trachea midline, ETT minimal secretions Cardiovascular: heart sounds are regular, ext are warm to touch. no edema Respiratory: suprisingly clear, no wheezing Gastrointestinal: abdomen is soft with + BS Skin: No rashes, normal turgor Neurologic: with sedation wean moves all 4 ext but not consistently to command and desats Psychiatric: RASS -1  Renal function worse K 5.2 400 cc urine output  CT Abd L large rectus sheath hematoma, COVID PNA on visible lung fields  Resolved Hospital Problem list      Assessment & Plan:  Acute hypoxemic resp failure secondary to Covid 19 Pna  - ARDSnet PEEP/FiO2, attempt to limit driving pressures as allowed by lung compliance - VAP prevention bundle -16/8h proning with A/a gradients < 150, adjust based on clinical response - Sedation to minimize shearing forces created by ventilator assynchrony - Continue methylprednisolone, family does not want remdesivir apparently due to concern for renal function deterioration.  Baricitinib contraindicated with her degree of renal dysfunction.  Actemra not good idea given baseline immunosuppression. Today: keep sedated while we figure out renal function  Acute on chronic renal failure- baseline glomerulonephritis on immunosuppressants.  ABLA and COVID infection likely cause of acute deterioration. Hyperkalemia- mild - Nephrology consultation, high risk for needing HD, appreciate Dr. Caprice Red help - Continue PTA tacro - Milly Jakob x 2 - Potential need for HD discussed with daughter  ABLA secondary to Rectus sheath hematoma- spontaneous, Hgb 5>>10 after 2 unit pRBC, now stable, no intervention should be needed -  Trend H/H - Hold AC for now - DC unasyn as there is now explanation for her "diverticulitis"  Baseline uncontrolled DM2 now exacerbated by steroids - SSI and tradjenta for now, may have to add long acting  Hypothyroid- synthroid PTA  Best practice:  Diet: start TF Pain/Anxiety/Delirium protocol (if indicated):  Precedex/ fentanyl/ propofol VAP protocol (if indicated): in place DVT prophylaxis: SCDs given ABLA GI prophylaxis: ppi Glucose control: see above Mobility: bed rest  Code Status: full Family Communication: updated daughter by phone Disposition: ICU pending vent liberation    The patient is critically ill with multiple organ systems failure and requires high complexity decision making for assessment and support, frequent evaluation and titration of therapies, application of advanced monitoring technologies and extensive interpretation of multiple databases. Critical Care Time devoted to patient care services described in this note independent of APP/resident time (if applicable)  is 35 minutes.   Erskine Emery MD [facility] 05/28/2020 11:28 AM Personal pager: [phone] If unanswered, please page CCM On-call: [phone]

## 2020-05-28 NOTE — Consult Note (Signed)
[facility]  INPATIENT CONSULTATION  Reason for Consultation: AKI on CKD Requesting Provider: Dr Tamala Julian  HPI: [name] is an 69 y.o. female with obesity, DM, OSA, HL, HTN, GERD, hypothyroidism, membranous glomerulopathy who is seen for evaluation and management of AKI on CKD in setting of hypoxic respiratory failure due to [place].  Presented [facility] 9/30 with cough, dyspnea > dx COVID. Unvaccinated.  Remdesivir was started but d/c'd at family request due to concern for renal function; baracitinib was started 10/3 but d/c'd 10/5 with concern for diverticulitis with abd pain.  She was initially was DNI but changed mind and with clinical worsening was intubated yesterday and transferred to [facility].  Hb 5s last PM and CT A/P this AM with large rectus sheath hematoma.  She was transfused and AM Hb 10s.    Creatinine generally in the 1.9-2.2 range and was initially at baseline but 10/5 2.81 and 10/6 4.2.  Appears yesterday afternoon and evening had some hypotension to 70-80s.   She is currently on low dose NE to maintain MAPs 65.  UOP initially 0.8-1.4L then yesterday 417mL and today 360mL so far.   Dx membranous >17y ago --> has followed with Dr. Jimmy Footman.  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  ~ mid 2021 started tacrolimus due to uptrending proteinuria; also on ARB.  Trough checked most recently was 9.7 on 1 BID.    PMH: Past Medical History:  Diagnosis Date  . Anemia    "as a kid" (06/18/2013)  . Anginal pain ([place])    "often; usually when thyroid goes out of wack and heart starts fluttering" (06/18/2013)  . Arthritis    "joints probably" (101/27/2014)  . Carotid stenosis ~ 01/2013   "bilaterally; ~ 50%; dx'd/carotid US" (06/18/2013)  . Dysrhythmia    "palpitation-type  feelings; related to thyroid" (06/18/2013)  . Exertional shortness of breath   . GERD (gastroesophageal reflux disease)   . Gout   . WJXBJYNW(295.6)    "monthly" (06/18/2013)  . Heart murmur   . Hepatitis, unspecified 1972   "husband had hepatitis; my mother, daughter, and me all had to get the gammaglobulin shots" (06/18/2013)  . High cholesterol   . Hypertension   . Hypothyroidism   . Membranous glomerulonephritis   . Pneumonia ?1980's; 09/2011   "twice" (06/18/2013)  . Sleep apnea    "have mask; haven't worn it in ~ 1 yr" (06/18/2013)  . Type II diabetes mellitus (HCC)    PSH: Past Surgical History:  Procedure Laterality Date  . BUNIONECTOMY Bilateral [id number]   "once on each side" (06/18/2013)  . CESAREAN SECTION  1971; 1977  . CHOLECYSTECTOMY  ?1998  . COLONOSCOPY N/A 12/03/2015   Procedure: COLONOSCOPY;  Surgeon: Daneil Dolin, MD;  [facility];  Service: Endoscopy;  Laterality: N/A;  8:30 AM  . RENAL BIOPSY Left ~ 1997; 06/18/2013  . SHOULDER ARTHROSCOPY W/ ROTATOR CUFF REPAIR Right 2009  . TONSILLECTOMY      Past Medical History:  Diagnosis Date  . Anemia    "as a kid" (06/18/2013)  . Anginal pain ([place])    "often; usually when thyroid goes out of wack and heart starts fluttering" (06/18/2013)  . Arthritis    "joints probably" (101/27/2014)  . Carotid stenosis ~ 01/2013   "bilaterally; ~ 50%; dx'd/carotid US" (06/18/2013)  .  Dysrhythmia    "palpitation-type feelings; related to thyroid" (06/18/2013)  . Exertional shortness of breath   . GERD (gastroesophageal reflux disease)   . Gout   . CVELFYBO(175.1)    "monthly" (06/18/2013)  . Heart murmur   . Hepatitis, unspecified 1972   "husband had hepatitis; my mother, daughter, and me all had to get the gammaglobulin shots" (06/18/2013)  . High cholesterol   . Hypertension   . Hypothyroidism   . Membranous glomerulonephritis   . Pneumonia ?1980's; 09/2011   "twice" (06/18/2013)  . Sleep apnea     "have mask; haven't worn it in ~ 1 yr" (06/18/2013)  . Type II diabetes mellitus (HCC)     Medications:  I have reviewed the patient's current medications.  Medications Prior to Admission  Medication Sig Dispense Refill  . allopurinol (ZYLOPRIM) 300 MG tablet Take 300 mg by mouth daily.    Marland Kitchen aspirin 81 MG tablet Take 81 mg by mouth daily.    . cetirizine (ZYRTEC) 10 MG tablet Take 10 mg by mouth at bedtime.    . ciprofloxacin (CIPRO) 500 MG tablet Take 500 mg by mouth 2 (two) times daily.    Marland Kitchen diltiazem (CARDIZEM CD) 240 MG 24 hr capsule Take 240 mg by mouth at bedtime.    . Esomeprazole Magnesium (NEXIUM PO) Take 40 mg by mouth daily.     . fenofibrate (TRICOR) 145 MG tablet Take 145 mg by mouth at bedtime.     . furosemide (LASIX) 40 MG tablet Take 2 tablets (80 mg total) by mouth daily. (Patient taking differently: Take 40 mg by mouth daily. )    . insulin aspart (NOVOLOG) 100 UNIT/ML injection Inject 3-11 Units into the skin 3 (three) times daily before meals. On sliding scale    . insulin detemir (LEVEMIR) 100 UNIT/ML injection Inject 0.1 mLs (10 Units total) into the skin daily. 25 Units AM; 10 units PM (Patient taking differently: Inject 10-25 Units into the skin daily. 30 Units AM; 35 units PM)    . metoprolol succinate (TOPROL-XL) 100 MG 24 hr tablet TAKE 1 TABLET BY MOUTH EVERY DAY WITH FOOD (Patient taking differently: Take 100 mg by mouth daily. ) 90 tablet 3  . ondansetron (ZOFRAN ODT) 4 MG disintegrating tablet Take 1 tablet (4 mg total) by mouth every 8 (eight) hours as needed for nausea or vomiting. 20 tablet 0  . rOPINIRole (REQUIP) 0.5 MG tablet Take 1 mg by mouth at bedtime.     . rosuvastatin (CRESTOR) 40 MG tablet Take 40 mg by mouth at bedtime.    . sodium bicarbonate 650 MG tablet Take 1,300 mg by mouth in the morning and at bedtime.    Marland Kitchen SYNTHROID 175 MCG tablet Take 175 mcg by mouth daily.   0  . tacrolimus (PROGRAF) 1 MG capsule Take 2 mg by mouth in the morning and  at bedtime.    Marland Kitchen telmisartan (MICARDIS) 80 MG tablet Take 40 mg by mouth at bedtime.    . nitroGLYCERIN (NITROSTAT) 0.4 MG SL tablet PLACE 1 TABLET (0.4 MG TOTAL) UNDER THE TONGUE EVERY 5 (FIVE) MINUTES AS NEEDED FOR CHEST PAIN. 25 tablet 3    ALLERGIES:   Allergies  Allergen Reactions  . Codeine Nausea And Vomiting  . Ibuprofen Other (See Comments)    Per kidney MD.  . Nsaids Other (See Comments)    Per Kidney DR. No NSAIDS  . Shellfish Allergy Other (See Comments)    REACTION: Gout Flare-ups   .  Septra Ds [Sulfamethoxazole-Trimethoprim] Itching and Rash    FAM HX: Family History  Problem Relation Age of Onset  . Cirrhosis Mother 32       Non-alcoholic related  . Coronary artery disease Father     Social History:   reports that she has never smoked. She has never used smokeless tobacco. She reports that she does not drink alcohol and does not use drugs.  ROS: unable to obtain intubated and sedated  Blood pressure 117/60, pulse 71, temperature 98.6 F (37 C), temperature source Axillary, resp. rate 13, height 5\' 1"  (1.549 m), weight 70.3 kg, SpO2 96 %. PHYSICAL EXAM: Gen: pale, intubated and sedated  ENT: ETT in place Neck: RIJ CVC c/d/i CV: RRR Abd: soft, nontender Lungs: coarse ant, currently on PSV 60% FiO2, RR normal GU: foley with little yellow urine Extr: no edema Neuro:sedated Skin: cool and dry   Results for orders placed or performed during the hospital encounter of 05/05/2020 (from the past 48 hour(s))  Glucose, capillary     Status: None   Collection Time: 05/26/20 12:00 PM  Result Value Ref Range   Glucose-Capillary 91 70 - 99 mg/dL    Comment: Glucose reference range applies only to samples taken after fasting for at least 8 hours.  Glucose, capillary     Status: Abnormal   Collection Time: 05/26/20  4:48 PM  Result Value Ref Range   Glucose-Capillary 49 (L) 70 - 99 mg/dL    Comment: Glucose reference range applies only to samples taken after fasting  for at least 8 hours.  Glucose, capillary     Status: Abnormal   Collection Time: 05/26/20  4:50 PM  Result Value Ref Range   Glucose-Capillary 41 (LL) 70 - 99 mg/dL    Comment: Glucose reference range applies only to samples taken after fasting for at least 8 hours. TEST WILL BE CREDITED Performed at [facility], [address], [place], [place] [address]    Comment 1 Repeat Test   Glucose, capillary     Status: Abnormal   Collection Time: 05/26/20  4:51 PM  Result Value Ref Range   Glucose-Capillary 44 (LL) 70 - 99 mg/dL    Comment: Glucose reference range applies only to samples taken after fasting for at least 8 hours.   Comment 1 Call MD NNP PA CNM   Glucose, capillary     Status: Abnormal   Collection Time: 05/26/20  5:43 PM  Result Value Ref Range   Glucose-Capillary 196 (H) 70 - 99 mg/dL    Comment: Glucose reference range applies only to samples taken after fasting for at least 8 hours.  Glucose, capillary     Status: Abnormal   Collection Time: 05/26/20  8:54 PM  Result Value Ref Range   Glucose-Capillary 110 (H) 70 - 99 mg/dL    Comment: Glucose reference range applies only to samples taken after fasting for at least 8 hours.   Comment 1 Notify RN    Comment 2 Document in Chart   CBC with Differential/Platelet     Status: Abnormal   Collection Time: 05/27/20  5:57 AM  Result Value Ref Range   WBC 15.5 (H) 4.0 - 10.5 K/uL   RBC 2.58 (L) 3.87 - 5.11 MIL/uL   Hemoglobin 8.2 (L) 12.0 - 15.0 g/dL   HCT 24.5 (L) 36 - 46 %   MCV 95.0 80.0 - 100.0 fL   MCH 31.8 26.0 - 34.0 pg   MCHC 33.5 30.0 - 36.0  g/dL   RDW 14.4 11.5 - 15.5 %   Platelets 319 150 - 400 K/uL   nRBC 0.0 0.0 - 0.2 %   Neutrophils Relative % 83 %   Neutro Abs 12.8 (H) 1.7 - 7.7 K/uL   Lymphocytes Relative 12 %   Lymphs Abs 1.9 0.7 - 4.0 K/uL   Monocytes Relative 4 %   Monocytes Absolute 0.6 0 - 1 K/uL   Eosinophils Relative 0 %   Eosinophils Absolute 0.0 0 - 0 K/uL   Basophils Relative 0 %    Basophils Absolute 0.0 0 - 0 K/uL   WBC Morphology WHITE COUNT CONFIRMED BY SMEAR    Immature Granulocytes 1 %   Abs Immature Granulocytes 0.17 (H) 0.00 - 0.07 K/uL    Comment: Performed at [facility], [address], [place], [place] [address]  Comprehensive metabolic panel     Status: Abnormal   Collection Time: 05/27/20  5:57 AM  Result Value Ref Range   Sodium 137 135 - 145 mmol/L   Potassium 5.0 3.5 - 5.1 mmol/L   Chloride 105 98 - 111 mmol/L   CO2 20 (L) 22 - 32 mmol/L   Glucose, Bld 149 (H) 70 - 99 mg/dL    Comment: Glucose reference range applies only to samples taken after fasting for at least 8 hours.   BUN 75 (H) 8 - 23 mg/dL   Creatinine, Ser 2.81 (H) 0.44 - 1.00 mg/dL    Comment: DELTA CHECK NOTED   Calcium 8.3 (L) 8.9 - 10.3 mg/dL   Total Protein 5.7 (L) 6.5 - 8.1 g/dL   Albumin 2.6 (L) 3.5 - 5.0 g/dL   AST 72 (H) 15 - 41 U/L   ALT 47 (H) 0 - 44 U/L   Alkaline Phosphatase 67 38 - 126 U/L   Total Bilirubin 0.7 0.3 - 1.2 mg/dL   GFR calc non Af Amer 17 (L) >60 mL/min   GFR calc Af Amer 19 (L) >60 mL/min   Anion gap 12 5 - 15    Comment: Performed at [facility], [address] [place], [place] [address]  C-reactive protein     Status: Abnormal   Collection Time: 05/27/20  5:57 AM  Result Value Ref Range   CRP 1.9 (H) <1.0 mg/dL    Comment: Performed at [facility], [address], [place], [place] [address]  D-dimer, quantitative (not at [facility])     Status: Abnormal   Collection Time: 05/27/20  5:57 AM  Result Value Ref Range   D-Dimer, Quant 0.92 (H) 0.00 - 0.50 ug/mL-FEU    Comment: (NOTE) At the manufacturer cut-off of 0.50 ug/mL FEU, this assay has been documented to exclude PE with a sensitivity and negative predictive value of 97 to 99%.  At this time, this assay has not been approved by the FDA to exclude DVT/VTE. Results should be correlated with clinical presentation. Performed at [facility], [address] [place], [place] [address]   Ferritin      Status: Abnormal   Collection Time: 05/27/20  5:57 AM  Result Value Ref Range   Ferritin 1,807 (H) 11 - 307 ng/mL    Comment: RESULT CONFIRMED BY AUTOMATED DILUTION Performed at [facility], [address] [place], [place] [address]   Magnesium     Status: Abnormal   Collection Time: 05/27/20  5:57 AM  Result Value Ref Range   Magnesium 2.8 (H) 1.7 - 2.4 mg/dL    Comment: Performed at [facility], [address], [place], [place] [address]  Glucose, capillary     Status: Abnormal   Collection Time: 05/27/20  7:43 AM  Result Value Ref Range   Glucose-Capillary 168 (H) 70 - 99 mg/dL    Comment: Glucose reference range applies only to samples taken after fasting for at least 8 hours.  Glucose, capillary     Status: Abnormal   Collection Time: 05/27/20 11:31 AM  Result Value Ref Range   Glucose-Capillary 280 (H) 70 - 99 mg/dL    Comment: Glucose reference range applies only to samples taken after fasting for at least 8 hours.  Blood gas, arterial     Status: Abnormal   Collection Time: 05/27/20 12:15 PM  Result Value Ref Range   FIO2 100.00    pH, Arterial 7.468 (H) 7.35 - 7.45   pCO2 arterial <19.0 (LL) 32 - 48 mmHg    Comment: CRITICAL RESULT CALLED TO, READ BACK BY AND VERIFIED WITH: HYLTON L. AT 1228P ON [id number] BY THOMPSON S.    pO2, Arterial 86.0 83 - 108 mmHg   Bicarbonate 16.8 (L) 20.0 - 28.0 mmol/L   Acid-base deficit 9.8 (H) 0.0 - 2.0 mmol/L   O2 Saturation 96.5 %   Patient temperature 36.1    Allens test (pass/fail) PASS PASS    Comment: Performed at [facility], [address] [place], [place] [address]  Draw ABG 1 hour after initiation of ventilator     Status: Abnormal   Collection Time: 05/27/20  3:30 PM  Result Value Ref Range   FIO2 100.00    pH, Arterial 7.395 7.35 - 7.45   pCO2 arterial 25.6 (L) 32 - 48 mmHg   pO2, Arterial 152 (H) 83 - 108 mmHg   Bicarbonate 17.4 (L) 20.0 - 28.0 mmol/L   Acid-base deficit 8.6 (H) 0.0 - 2.0 mmol/L   O2 Saturation 98.8 %    Patient temperature 36.1    Allens test (pass/fail) PASS PASS    Comment: Performed at [facility], [address], [place], [place] [address]  Glucose, capillary     Status: Abnormal   Collection Time: 05/27/20  5:10 PM  Result Value Ref Range   Glucose-Capillary 334 (H) 70 - 99 mg/dL    Comment: Glucose reference range applies only to samples taken after fasting for at least 8 hours.  Procalcitonin - Baseline     Status: None   Collection Time: 05/27/20  5:16 PM  Result Value Ref Range   Procalcitonin 0.58 ng/mL    Comment:        Interpretation: PCT > 0.5 ng/mL and <= 2 ng/mL: Systemic infection (sepsis) is possible, but other conditions are known to elevate PCT as well. (NOTE)       Sepsis PCT Algorithm           Lower Respiratory Tract                                      Infection PCT Algorithm    ----------------------------     ----------------------------         PCT < 0.25 ng/mL                PCT < 0.10 ng/mL          Strongly encourage             Strongly discourage   discontinuation of antibiotics  initiation of antibiotics    ----------------------------     -----------------------------       PCT 0.25 - 0.50 ng/mL            PCT 0.10 - 0.25 ng/mL               OR       >80% decrease in PCT            Discourage initiation of                                            antibiotics      Encourage discontinuation           of antibiotics    ----------------------------     -----------------------------         PCT >= 0.50 ng/mL              PCT 0.26 - 0.50 ng/mL                AND       <80% decrease in PCT             Encourage initiation of                                             antibiotics       Encourage continuation           of antibiotics    ----------------------------     -----------------------------        PCT >= 0.50 ng/mL                  PCT > 0.50 ng/mL               AND         increase in PCT                  Strongly encourage                                       initiation of antibiotics    Strongly encourage escalation           of antibiotics                                     -----------------------------                                           PCT <= 0.25 ng/mL                                                 OR                                        >  80% decrease in PCT                                      Discontinue / Do not initiate                                             antibiotics  Performed at [facility], [address] [place], [place] [address]   CBC     Status: Abnormal   Collection Time: 05/27/20  5:16 PM  Result Value Ref Range   WBC 19.4 (H) 4.0 - 10.5 K/uL   RBC 1.77 (L) 3.87 - 5.11 MIL/uL   Hemoglobin 5.7 (LL) 12.0 - 15.0 g/dL    Comment: This critical result has verified and been called to [facility] by Charlie Pitter on 10 05 2021 at 1737, and has been read back.    HCT 16.9 (L) 36 - 46 %   MCV 95.5 80.0 - 100.0 fL   MCH 32.2 26.0 - 34.0 pg   MCHC 33.7 30.0 - 36.0 g/dL   RDW 14.5 11.5 - 15.5 %   Platelets 361 150 - 400 K/uL   nRBC 0.0 0.0 - 0.2 %    Comment: Performed at [facility], [address] [place], [place] [address]  Hemoglobin and hematocrit, blood     Status: Abnormal   Collection Time: 05/27/20  6:00 PM  Result Value Ref Range   Hemoglobin 5.7 (LL) 12.0 - 15.0 g/dL    Comment: This critical result has verified and been called to [facility] by Finis Bud on 10 05 2021 at 1831, and has been read back.    HCT 16.8 (L) 36 - 46 %    Comment: Performed at [facility], [address] [place], [place] [address]  Type and screen [place]     Status: None   Collection Time: 05/27/20  7:20 PM  Result Value Ref Range   ABO/RH(D) B POS    Antibody Screen NEG    Sample Expiration 05/30/2020,2359    Unit Number [id number]    Blood Component Type RED CELLS,LR    Unit division 00    Status of Unit ISSUED,FINAL    Transfusion Status OK TO  TRANSFUSE    Crossmatch Result      Compatible Performed at [facility], [place] [address] [place], [place] [address]    Unit Number [id number]    Blood Component Type RED CELLS,LR    Unit division 00    Status of Unit ISSUED,FINAL    Transfusion Status OK TO TRANSFUSE    Crossmatch Result Compatible   MRSA PCR Screening     Status: None   Collection Time: 05/27/20  7:24 PM   Specimen: Nasal Mucosa; Nasopharyngeal  Result Value Ref Range   MRSA by PCR NEGATIVE NEGATIVE    Comment:        The GeneXpert MRSA Assay (FDA approved for NASAL specimens only), is one component of a comprehensive MRSA colonization surveillance program. It is not intended to diagnose MRSA infection nor to guide or monitor treatment for MRSA infections. Performed at [facility], [place] [address], [place], [name] [address]   Glucose, capillary     Status: Abnormal   Collection Time: 05/27/20  7:43  PM  Result Value Ref Range   Glucose-Capillary 277 (H) 70 - 99 mg/dL    Comment: Glucose reference range applies only to samples taken after fasting for at least 8 hours.  DIC Panel (Not at [facility]) ONCE - STAT     Status: Abnormal   Collection Time: 05/27/20  8:24 PM  Result Value Ref Range   Prothrombin Time 17.4 (H) 11.4 - 15.2 seconds   INR 1.5 (H) 0.8 - 1.2    Comment: (NOTE) INR goal varies based on device and disease states.    aPTT 33 24 - 36 seconds   Fibrinogen 496 (H) 210 - 475 mg/dL   D-Dimer, Quant 1.66 (H) 0.00 - 0.50 ug/mL-FEU    Comment: (NOTE) At the manufacturer cut-off of 0.50 ug/mL FEU, this assay has been documented to exclude PE with a sensitivity and negative predictive value of 97 to 99%.  At this time, this assay has not been approved by the FDA to exclude DVT/VTE. Results should be correlated with clinical presentation.    Platelets 365 150 - 400 K/uL   Smear Review NO SCHISTOCYTES SEEN     Comment: Performed at [facility], [place] [address]  [place], [name] [address]  Hemoglobin and hematocrit, blood     Status: Abnormal   Collection Time: 05/27/20  8:24 PM  Result Value Ref Range   Hemoglobin 5.6 (LL) 12.0 - 15.0 g/dL    Comment: REPEATED TO VERIFY THIS CRITICAL RESULT HAS VERIFIED AND BEEN CALLED TO RN OFORI EUNICE BY MESSAN HOUEGNIFIO ON 10 05 2021 AT 2101, AND HAS BEEN READ BACK.  CRITICAL RESULT CALLED TO, READ BACK BY AND VERIFIED WITH: RN NIX GARRETT AT 2112 BY MESSAN H. ON 05/27/2020 CORRECTED ON 10/05 AT 2112: PREVIOUSLY REPORTED AS 5.6 REPEATED TO VERIFY THIS CRITICAL RESULT HAS VERIFIED AND BEEN CALLED TO RN OFORI EUNICE BY [place] ON 10 05 2021 AT 2101, AND HAS BEEN READ BACK.     HCT 17.6 (L) 36 - 46 %    Comment: Performed at [facility], [place] [address] [place], [name] [address]  Lactic acid, plasma     Status: Abnormal   Collection Time: 05/27/20  8:24 PM  Result Value Ref Range   Lactic Acid, Venous 3.0 (HH) 0.5 - 1.9 mmol/L    Comment: CRITICAL RESULT CALLED TO, READ BACK BY AND VERIFIED WITH: Riki Altes RN [id number] K FORSYTH Performed at [facility], [address], [place], [name] [address]   I-STAT 7, (LYTES, BLD GAS, ICA, H+H)     Status: Abnormal   Collection Time: 05/27/20  9:08 PM  Result Value Ref Range   pH, Arterial 7.346 (L) 7.35 - 7.45   pCO2 arterial 26.6 (L) 32 - 48 mmHg   pO2, Arterial 207 (H) 83 - 108 mmHg   Bicarbonate 14.5 (L) 20.0 - 28.0 mmol/L   TCO2 15 (L) 22 - 32 mmol/L   O2 Saturation 100.0 %   Acid-base deficit 10.0 (H) 0.0 - 2.0 mmol/L   Sodium 139 135 - 145 mmol/L   Potassium 5.8 (H) 3.5 - 5.1 mmol/L   Calcium, Ion 1.05 (L) 1.15 - 1.40 mmol/L   HCT 15.0 (L) 36 - 46 %   Hemoglobin 5.1 (LL) 12.0 - 15.0 g/dL   Patient temperature 99.3 F    Collection site Radial    Drawn by HIDE    Sample type ARTERIAL    Comment NOTIFIED PHYSICIAN  Prepare RBC (crossmatch)     Status: None   Collection Time: 05/27/20  9:43 PM  Result Value Ref Range   Order  Confirmation      ORDER PROCESSED BY BLOOD BANK Performed at [facility], [address] [place], [name] [address]   Glucose, capillary     Status: Abnormal   Collection Time: 05/27/20 11:22 PM  Result Value Ref Range   Glucose-Capillary 193 (H) 70 - 99 mg/dL    Comment: Glucose reference range applies only to samples taken after fasting for at least 8 hours.  CBC     Status: Abnormal   Collection Time: 05/28/20  1:55 AM  Result Value Ref Range   WBC 19.3 (H) 4.0 - 10.5 K/uL   RBC 3.37 (L) 3.87 - 5.11 MIL/uL   Hemoglobin 10.5 (L) 12.0 - 15.0 g/dL    Comment: REPEATED TO VERIFY POST TRANSFUSION SPECIMEN DELTA CHECK NOTED    HCT 30.6 (L) 36 - 46 %   MCV 90.8 80.0 - 100.0 fL    Comment: POST TRANSFUSION SPECIMEN REPEATED TO VERIFY DELTA CHECK NOTED    MCH 31.2 26.0 - 34.0 pg   MCHC 34.3 30.0 - 36.0 g/dL   RDW 14.5 11.5 - 15.5 %   Platelets 298 150 - 400 K/uL   nRBC 0.0 0.0 - 0.2 %    Comment: Performed at [facility], [place] [address] [place], [name] [address]  Lactic acid, plasma     Status: None   Collection Time: 05/28/20  1:55 AM  Result Value Ref Range   Lactic Acid, Venous 1.6 0.5 - 1.9 mmol/L    Comment: Performed at [place] [address], [place], [name] [address]  Glucose, capillary     Status: Abnormal   Collection Time: 05/28/20  3:55 AM  Result Value Ref Range   Glucose-Capillary 161 (H) 70 - 99 mg/dL    Comment: Glucose reference range applies only to samples taken after fasting for at least 8 hours.  Basic metabolic panel     Status: Abnormal   Collection Time: 05/28/20  5:12 AM  Result Value Ref Range   Sodium 141 135 - 145 mmol/L   Potassium 5.2 (H) 3.5 - 5.1 mmol/L   Chloride 111 98 - 111 mmol/L   CO2 16 (L) 22 - 32 mmol/L   Glucose, Bld 179 (H) 70 - 99 mg/dL    Comment: Glucose reference range applies only to samples taken after fasting for at least 8 hours.   BUN 91 (H) 8 - 23 mg/dL   Creatinine, Ser 4.20 (H) 0.44 - 1.00  mg/dL   Calcium 7.3 (L) 8.9 - 10.3 mg/dL   GFR calc non Af Amer 10 (L) >60 mL/min   Anion gap 14 5 - 15    Comment: Performed at [facility] [address] [place], [place] [address]  Magnesium     Status: Abnormal   Collection Time: 05/28/20  5:12 AM  Result Value Ref Range   Magnesium 2.8 (H) 1.7 - 2.4 mg/dL    Comment: Performed at [facility] [address], [place], [place] [address]  Phosphorus     Status: Abnormal   Collection Time: 05/28/20  5:12 AM  Result Value Ref Range   Phosphorus 8.6 (H) 2.5 - 4.6 mg/dL    Comment: Performed at [facility] [address] [place],  [address]  CBC  Status: Abnormal   Collection Time: 05/28/20  5:12 AM  Result Value Ref Range   WBC 21.7 (H) 4.0 - 10.5 K/uL   RBC 3.35 (L) 3.87 - 5.11 MIL/uL   Hemoglobin 10.2 (L) 12.0 - 15.0 g/dL   HCT 30.1 (L) 36 - 46 %   MCV 89.9 80.0 - 100.0 fL   MCH 30.4 26.0 - 34.0 pg   MCHC 33.9 30.0 - 36.0 g/dL   RDW 15.0 11.5 - 15.5 %   Platelets 317 150 - 400 K/uL   nRBC 0.1 0.0 - 0.2 %    Comment: Performed at [facility], [place] [address] [place], [place] [address]  Triglycerides     Status: Abnormal   Collection Time: 05/28/20  5:12 AM  Result Value Ref Range   Triglycerides 389 (H) <150 mg/dL    Comment: Performed at [place] [address] [place], [place] [address]  Glucose, capillary     Status: Abnormal   Collection Time: 05/28/20  8:07 AM  Result Value Ref Range   Glucose-Capillary 169 (H) 70 - 99 mg/dL    Comment: Glucose reference range applies only to samples taken after fasting for at least 8 hours.    CT ABDOMEN PELVIS WO CONTRAST  Result Date: 05/28/2020 CLINICAL DATA:  GI bleeding EXAM: CT ABDOMEN AND PELVIS WITHOUT CONTRAST TECHNIQUE: Multidetector CT imaging of the abdomen and pelvis was performed following the standard protocol without IV contrast. COMPARISON:  10/15/2018 FINDINGS: Lower chest: Normal heart size. Left-sided central  line with tip at the distal SVC. Ground-glass opacity in the bilateral lungs consistent with history of COVID-19 positivity. Hepatobiliary: No focal liver abnormality.Cholecystectomy without bile duct dilatation. Pancreas: Generalized atrophy Spleen: Granulomatous calcifications. Adrenals/Urinary Tract: Negative adrenals. No hydronephrosis or stone. Bladder is decompressed by Foley catheter and rightward displaced by the below described hematoma Stomach/Bowel: History of GI bleeding. No high-density material filling the stomach or other definite segment. Oral contrast is seen within nondilated colon where there are numerous left colonic diverticula. There is an enteric tube in good position. No visible bowel inflammation. Vascular/Lymphatic: Limited without contrast. Aortic atherosclerosis. No retroperitoneal mass or adenopathy. There is extraperitoneal hematoma as described below. Reproductive:Unremarkable for age Other: Rectus sheath hematoma inferiorly on the left which measures 14 cm craniocaudal and 8 cm in thickness. Inferiorly, the hematoma dissects into the extraperitoneal space on the left more than right with mass effect on pelvic viscera. No hemoperitoneum or pneumoperitoneum. Musculoskeletal: Spondylosis and degenerative change without acute abnormalities. Critical Value/emergent results were called by telephone at the time of interpretation on 05/28/2020 at 4:41 am to provider Dr Duwayne Heck , who verbally acknowledged these results. IMPRESSION: 1. Large left rectus sheath hematoma dissecting into the extraperitoneal space. The hematoma measures 14 cm craniocaudal and 8 cm in thickness. 2. COVID pneumonia. 3. Atherosclerosis and colonic diverticulosis. Electronically Signed   By: Monte Fantasia M.D.   On: 05/28/2020 04:41   DG CHEST PORT 1 VIEW  Result Date: 05/27/2020 CLINICAL DATA:  COVID pneumonia on ventilator. EXAM: PORTABLE CHEST 1 VIEW COMPARISON:  05/27/2020 FINDINGS: The endotracheal tube, NG  tube and left IJ central venous catheters are stable. Patchy subtle bilateral infiltrates slightly improved. No pleural effusions or pneumothorax. IMPRESSION: 1. Stable support apparatus. 2. Slight interval improvement in bilateral infiltrates. Electronically Signed   By: Marijo Sanes M.D.   On: 05/27/2020 20:16   Portable Chest x-ray  Result Date: 05/27/2020 CLINICAL DATA:  Shortness of breath, intubation. EXAM:  PORTABLE CHEST 1 VIEW COMPARISON:  Chest radiograph dated 05/20/2020. FINDINGS: There is been interval placement of an endotracheal tube in the midthoracic trachea. An enteric tube terminates in the stomach. The heart size is normal. Vascular calcifications are seen in the aortic arch. Mild patchy bilateral airspace opacities appear similar to prior exam. There is no pleural effusion or pneumothorax. The visualized skeletal structures are unremarkable. IMPRESSION: 1. Endotracheal tube in appropriate position. 2. Unchanged mild patchy bilateral airspace opacities. Electronically Signed   By: Zerita Boers M.D.   On: 05/27/2020 14:19   DG Chest Port 1V same Day  Result Date: 05/27/2020 CLINICAL DATA:  Status post PICC placement. EXAM: PORTABLE CHEST 1 VIEW COMPARISON:  Earlier today FINDINGS: Left central line which may be an internal jugular line with tip in the region of the lower SVC. No visualized pneumothorax. Skin fold projects over the left chest. Endotracheal tube tip is approximately 3.1 cm from the carina, obscured by overlying EKG lead. Enteric tube in place with tip and side-port below the diaphragm in the stomach. Patchy heterogeneous bilateral airspace opacities in a mid-lower lung zone predominant distribution, unchanged. No evidence of pneumomediastinum. Remote right clavicle fracture. IMPRESSION: 1. Left central line with tip in the region of the lower SVC. No pneumothorax. 2. Endotracheal tube and enteric tube remain in place. 3. Unchanged bilateral heterogeneous airspace opacities  consistent with COVID pneumonia. Electronically Signed   By: Keith Rake M.D.   On: 05/27/2020 16:34    Assessment/Plan **hypoxic respiratory failure: secondary to [place].  Vent and therapies per PCCM.   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  Presume she has ATN on top of baseline CKD.  Started on prograf several months ago for persistant proteinuria but in setting of acute issues and shock I will d/c it.  Also hold RAAS inhibition at this time.  She is become progressively oliguric and azotemic and has a high risk for progression to need for dialysis in next 24-48h.  I have discussed my medication recommendations and dialysis possibility with daughter and she is in agreement to proceed.  Requests a call prior to initiation of dialysis should it be necessary.  **Hyperkalemia: modest - being given 2 doses lokelma, follow  **Metabolic acidosis: secondary to AKI, enteral bicarb ordered.  **ABLA: rectus hematoma, Hb  Improved s/p transfusion.   Justin Mend 05/28/2020, 10:52 AM

## 2020-05-28 NOTE — Progress Notes (Signed)
Inpatient Diabetes Program Recommendations  AACE/ADA: New Consensus Statement on Inpatient Glycemic Control (2015)  Target Ranges:  Prepandial:   less than 140 mg/dL      Peak postprandial:   less than 180 mg/dL (1-2 hours)      Critically ill patients:  140 - 180 mg/dL   Lab Results  Component Value Date   GLUCAP 169 (H) 05/28/2020   HGBA1C 9.1 (H) 05/09/2020    Review of Glycemic Control Results for MESA, JANUS (MRN [id number]) as of 05/28/2020 10:15  Ref. Range 05/27/2020 17:10 05/27/2020 19:43 05/27/2020 23:22 05/28/2020 03:55 05/28/2020 08:07  Glucose-Capillary Latest Ref Range: 70 - 99 mg/dL 334 (H) 277 (H) 193 (H) 161 (H) 169 (H)   Diabetes history: Type 2 DM Outpatient Diabetes medications: Novolog 3-11 units TID, Levemir 10 units QD Current orders for Inpatient glycemic control: Novolog 0-20 units Q4H Solumedrol 40 mg BID  Inpatient Diabetes Program Recommendations:    If steroids to continue, consider adding Levemir 7 units BID and Tradjenta 5 mg QD.   Thanks, Bronson Curb, MSN, RNC-OB Diabetes Coordinator [phone] (8a-5p)

## 2020-05-29 ENCOUNTER — Inpatient Hospital Stay (HOSPITAL_COMMUNITY): Payer: Medicare HMO

## 2020-05-29 DIAGNOSIS — J9601 Acute respiratory failure with hypoxia: Secondary | ICD-10-CM | POA: Diagnosis not present

## 2020-05-29 DIAGNOSIS — U071 COVID-19: Secondary | ICD-10-CM | POA: Diagnosis not present

## 2020-05-29 LAB — POCT I-STAT 7, (LYTES, BLD GAS, ICA,H+H)
Acid-base deficit: 6 mmol/L — ABNORMAL HIGH (ref 0.0–2.0)
Acid-base deficit: 5 mmol/L — ABNORMAL HIGH (ref 0.0–2.0)
Acid-base deficit: 4 mmol/L — ABNORMAL HIGH (ref 0.0–2.0)
Bicarbonate: 17.8 mmol/L — ABNORMAL LOW (ref 20.0–28.0)
Bicarbonate: 24.7 mmol/L (ref 20.0–28.0)
Bicarbonate: 23.7 mmol/L (ref 20.0–28.0)
Calcium, Ion: 1.13 mmol/L — ABNORMAL LOW (ref 1.15–1.40)
Calcium, Ion: 1.24 mmol/L (ref 1.15–1.40)
Calcium, Ion: 1.23 mmol/L (ref 1.15–1.40)
HCT: 22 % — ABNORMAL LOW (ref 36.0–46.0)
HCT: 25 % — ABNORMAL LOW (ref 36.0–46.0)
HCT: 23 % — ABNORMAL LOW (ref 36.0–46.0)
Hemoglobin: 7.5 g/dL — ABNORMAL LOW (ref 12.0–15.0)
Hemoglobin: 8.5 g/dL — ABNORMAL LOW (ref 12.0–15.0)
Hemoglobin: 7.8 g/dL — ABNORMAL LOW (ref 12.0–15.0)
O2 Saturation: 90 %
O2 Saturation: 95 %
O2 Saturation: 93 %
Patient temperature: 99
Patient temperature: 99.7
Patient temperature: 99
Potassium: 4.5 mmol/L (ref 3.5–5.1)
Potassium: 4.7 mmol/L (ref 3.5–5.1)
Potassium: 4.8 mmol/L (ref 3.5–5.1)
Sodium: 142 mmol/L (ref 135–145)
Sodium: 145 mmol/L (ref 135–145)
Sodium: 145 mmol/L (ref 135–145)
TCO2: 19 mmol/L — ABNORMAL LOW (ref 22–32)
TCO2: 27 mmol/L (ref 22–32)
TCO2: 25 mmol/L (ref 22–32)
pCO2 arterial: 28.6 mmHg — ABNORMAL LOW (ref 32.0–48.0)
pCO2 arterial: 81.8 mmHg (ref 32.0–48.0)
pCO2 arterial: 56.4 mmHg — ABNORMAL HIGH (ref 32.0–48.0)
pH, Arterial: 7.403 (ref 7.350–7.450)
pH, Arterial: 7.091 — CL (ref 7.350–7.450)
pH, Arterial: 7.232 — ABNORMAL LOW (ref 7.350–7.450)
pO2, Arterial: 58 mmHg — ABNORMAL LOW (ref 83.0–108.0)
pO2, Arterial: 113 mmHg — ABNORMAL HIGH (ref 83.0–108.0)
pO2, Arterial: 82 mmHg — ABNORMAL LOW (ref 83.0–108.0)

## 2020-05-29 LAB — GLUCOSE, CAPILLARY
Glucose-Capillary: 232 mg/dL — ABNORMAL HIGH (ref 70–99)
Glucose-Capillary: 233 mg/dL — ABNORMAL HIGH (ref 70–99)
Glucose-Capillary: 254 mg/dL — ABNORMAL HIGH (ref 70–99)
Glucose-Capillary: 275 mg/dL — ABNORMAL HIGH (ref 70–99)
Glucose-Capillary: 290 mg/dL — ABNORMAL HIGH (ref 70–99)
Glucose-Capillary: 306 mg/dL — ABNORMAL HIGH (ref 70–99)
Glucose-Capillary: 314 mg/dL — ABNORMAL HIGH (ref 70–99)

## 2020-05-29 LAB — CBC
HCT: 24.9 % — ABNORMAL LOW (ref 36.0–46.0)
Hemoglobin: 8.3 g/dL — ABNORMAL LOW (ref 12.0–15.0)
MCH: 30.2 pg (ref 26.0–34.0)
MCHC: 33.3 g/dL (ref 30.0–36.0)
MCV: 90.5 fL (ref 80.0–100.0)
Platelets: 232 10*3/uL (ref 150–400)
RBC: 2.75 MIL/uL — ABNORMAL LOW (ref 3.87–5.11)
RDW: 15.4 % (ref 11.5–15.5)
WBC: 17.7 10*3/uL — ABNORMAL HIGH (ref 4.0–10.5)
nRBC: 0 % (ref 0.0–0.2)

## 2020-05-29 LAB — BASIC METABOLIC PANEL
Anion gap: 14 (ref 5–15)
Anion gap: 12 (ref 5–15)
BUN: 90 mg/dL — ABNORMAL HIGH (ref 8–23)
BUN: 89 mg/dL — ABNORMAL HIGH (ref 8–23)
CO2: 18 mmol/L — ABNORMAL LOW (ref 22–32)
CO2: 20 mmol/L — ABNORMAL LOW (ref 22–32)
Calcium: 7.9 mg/dL — ABNORMAL LOW (ref 8.9–10.3)
Calcium: 8 mg/dL — ABNORMAL LOW (ref 8.9–10.3)
Chloride: 112 mmol/L — ABNORMAL HIGH (ref 98–111)
Chloride: 113 mmol/L — ABNORMAL HIGH (ref 98–111)
Creatinine, Ser: 3.66 mg/dL — ABNORMAL HIGH (ref 0.44–1.00)
Creatinine, Ser: 3.44 mg/dL — ABNORMAL HIGH (ref 0.44–1.00)
GFR calc non Af Amer: 12 mL/min — ABNORMAL LOW (ref >60)
GFR calc non Af Amer: 13 mL/min — ABNORMAL LOW (ref >60)
Glucose, Bld: 249 mg/dL — ABNORMAL HIGH (ref 70–99)
Glucose, Bld: 329 mg/dL — ABNORMAL HIGH (ref 70–99)
Potassium: 4.4 mmol/L (ref 3.5–5.1)
Potassium: 4.9 mmol/L (ref 3.5–5.1)
Sodium: 144 mmol/L (ref 135–145)
Sodium: 145 mmol/L (ref 135–145)

## 2020-05-29 LAB — MAGNESIUM
Magnesium: 2.7 mg/dL — ABNORMAL HIGH (ref 1.7–2.4)
Magnesium: 2.8 mg/dL — ABNORMAL HIGH (ref 1.7–2.4)

## 2020-05-29 LAB — PROCALCITONIN: Procalcitonin: 0.88 ng/mL

## 2020-05-29 LAB — PHOSPHORUS
Phosphorus: 7.2 mg/dL — ABNORMAL HIGH (ref 2.5–4.6)
Phosphorus: 6.4 mg/dL — ABNORMAL HIGH (ref 2.5–4.6)

## 2020-05-29 LAB — TRIGLYCERIDES: Triglycerides: 564 mg/dL — ABNORMAL HIGH (ref <150)

## 2020-05-29 MED ORDER — FUROSEMIDE 10 MG/ML IJ SOLN
80.0000 mg | Freq: Once | INTRAMUSCULAR | Status: AC
  Administered 2020-05-29: 80 mg via INTRAVENOUS
  Filled 2020-05-29: qty 8

## 2020-05-29 MED ORDER — VECURONIUM BROMIDE 10 MG IV SOLR
10.0000 mg | INTRAVENOUS | Status: DC | PRN
  Administered 2020-05-29 – 2020-06-13 (×24): 10 mg via INTRAVENOUS
  Filled 2020-05-29 (×24): qty 10

## 2020-05-29 MED ORDER — INSULIN ASPART 100 UNIT/ML ~~LOC~~ SOLN
8.0000 [IU] | SUBCUTANEOUS | Status: DC
  Administered 2020-05-29 – 2020-05-30 (×4): 8 [IU] via SUBCUTANEOUS

## 2020-05-29 MED ORDER — MIDAZOLAM 50MG/50ML (1MG/ML) PREMIX INFUSION
0.0000 mg/h | INTRAVENOUS | Status: DC
  Administered 2020-05-29: 4 mg/h via INTRAVENOUS
  Administered 2020-05-29: 10 mg/h via INTRAVENOUS
  Administered 2020-05-29: 8 mg/h via INTRAVENOUS
  Administered 2020-05-30 – 2020-05-31 (×6): 10 mg/h via INTRAVENOUS
  Administered 2020-05-31: 6 mg/h via INTRAVENOUS
  Administered 2020-05-31: 3 mg/h via INTRAVENOUS
  Administered 2020-06-01 (×2): 9 mg/h via INTRAVENOUS
  Administered 2020-06-01 – 2020-06-02 (×7): 10 mg/h via INTRAVENOUS
  Filled 2020-05-29 (×20): qty 50

## 2020-05-29 MED ORDER — INSULIN DETEMIR 100 UNIT/ML ~~LOC~~ SOLN
30.0000 [IU] | Freq: Two times a day (BID) | SUBCUTANEOUS | Status: DC
  Administered 2020-05-29 – 2020-06-01 (×7): 30 [IU] via SUBCUTANEOUS
  Filled 2020-05-29 (×10): qty 0.3

## 2020-05-29 MED ORDER — MIDAZOLAM BOLUS VIA INFUSION
1.0000 mg | INTRAVENOUS | Status: DC | PRN
  Administered 2020-05-31 – 2020-06-02 (×2): 2 mg via INTRAVENOUS
  Administered 2020-06-05 (×4): 1 mg via INTRAVENOUS
  Administered 2020-06-07 – 2020-06-13 (×8): 2 mg via INTRAVENOUS
  Filled 2020-05-29: qty 2

## 2020-05-29 MED ORDER — SODIUM BICARBONATE 650 MG PO TABS
1300.0000 mg | ORAL_TABLET | Freq: Two times a day (BID) | ORAL | Status: DC
  Administered 2020-05-30 – 2020-06-09 (×22): 1300 mg
  Filled 2020-05-29 (×23): qty 2

## 2020-05-29 NOTE — Progress Notes (Signed)
[facility] Progress Note Patient Name: [name] DOB: [birth year] MRN: [id number]   Date of Service  05/29/2020  HPI/Events of Note  Repeat ABG shows acceptable improvement in parameters.  eICU Interventions  Increase respiratory rate to 30        Ryleah Miramontes U Jeoffrey Eleazer 05/29/2020, 8:53 PM

## 2020-05-29 NOTE — Progress Notes (Signed)
[facility] Progress Note   Subjective:   Issues with vent dyssynchrony, need for ^ sedation.  UOP 1.2L yest, net +930m yest.  BUN 91 > 4.2 --> today 90/3.66, K 4.4.  Objective Vitals:   05/29/20 0845 05/29/20 0900 05/29/20 1000 05/29/20 1100  BP:  101/70 (!) 105/58   Pulse:  96 (!) 103 (!) 103  Resp:  _0 Temp:      TempSrc:      SpO2: 95% 94% 92% 91%  Weight:      Height:       Physical Exam Deferred - reviewed chart in detail  Additional Objective Labs: Basic Metabolic Panel: Recent Labs  Lab 05/27/20 0557 05/27/20 2108 05/28/20 0512 05/28/20 0512 05/28/20 1323 05/28/20 1812 05/29/20 0348 05/29/20 0430  NA 137   < > 141  --   --   --  144 142  K 5.0   < > 5.2*  --   --   --  4.4 4.5  CL 105  --  111  --   --   --  112*  --   CO2 20*  --  16*  --   --   --  18*  --   GLUCOSE 149*  --  179*  --   --   --  249*  --   BUN 75*  --  91*  --   --   --  90*  --   CREATININE 2.81*  --  4.20*  --   --   --  3.66*  --   CALCIUM 8.3*  --  7.3*  --   --   --  7.9*  --   PHOS  --   --  8.6*   < > 8.3* 7.9* 7.2*  --    < > = values in this interval not displayed.   Liver Function Tests: Recent Labs  Lab 05/25/20 0444 05/26/20 0439 05/27/20 0557  AST 35 178* 72*  ALT 14 52* 47*  ALKPHOS 38 71 67  BILITOT 0.3 0.5 0.7  PROT 6.1* 6.1* 5.7*  ALBUMIN 2.2* 2.2* 2.6*   No results for input(s): LIPASE, AMYLASE in the last 168 hours. CBC: Recent Labs  Lab 05/25/20 0444 05/25/20 0444 05/26/20 0439 05/26/20 0439 05/27/20 0557 05/27/20 1716 05/28/20 0155 05/28/20 0155 05/28/20 0512 05/28/20 0512 05/28/20 1323 05/28/20 1323 05/28/20 1812 05/29/20 0348 05/29/20 0430  WBC 10.9*   < > 11.9*   < > 15.5*   < > 19.3*   < > 21.7*   < > 20.5*  --  18.8* 17.7*  --   NEUTROABS 9.3*  --  10.1*  --  12.8*  --   --   --   --   --   --   --   --   --   --   HGB 9.9*   < > 10.3*   < > 8.2*   < > 10.5*   < > 10.2*   < > 9.5*   < > 8.9* 8.3* 7.5*  HCT 29.4*    < > 30.8*   < > 24.5*   < > 30.6*   < > 30.1*   < > 28.2*   < > 26.3* 24.9* 22.0*  MCV 92.7   < > 93.9   < > 95.0   < > 90.8  --  89.9  --  89.5  --  89.2 90.5  --  PLT 322   < > 320   < > 319   < > 298   < > 317   < > 301  --  246 232  --    < > = values in this interval not displayed.   Blood Culture    Component Value Date/Time   SDES TRACHEAL ASPIRATE 05/28/2020 0912   SPECREQUEST Normal 05/28/2020 0912   CULT  05/28/2020 0912    CULTURE REINCUBATED FOR BETTER GROWTH Performed at [facility], [place] [address] [place], [place] [address]    REPTSTATUS PENDING 05/28/2020 2725    Cardiac Enzymes: No results for input(s): CKTOTAL, CKMB, CKMBINDEX, TROPONINI in the last 168 hours. CBG: Recent Labs  Lab 05/28/20 1949 05/29/20 0001 05/29/20 0347 05/29/20 0754 05/29/20 1125  GLUCAP 237* 254* 233* 306* 290*   Iron Studies:  Recent Labs    05/27/20 0557  FERRITIN 1,807*   _0 @ Studies/Results: CT ABDOMEN PELVIS WO CONTRAST  Result Date: 05/28/2020 CLINICAL DATA:  GI bleeding EXAM: CT ABDOMEN AND PELVIS WITHOUT CONTRAST TECHNIQUE: Multidetector CT imaging of the abdomen and pelvis was performed following the standard protocol without IV contrast. COMPARISON:  10/15/2018 FINDINGS: Lower chest: Normal heart size. Left-sided central line with tip at the distal SVC. Ground-glass opacity in the bilateral lungs consistent with history of COVID-19 positivity. Hepatobiliary: No focal liver abnormality.Cholecystectomy without bile duct dilatation. Pancreas: Generalized atrophy Spleen: Granulomatous calcifications. Adrenals/Urinary Tract: Negative adrenals. No hydronephrosis or stone. Bladder is decompressed by Foley catheter and rightward displaced by the below described hematoma Stomach/Bowel: History of GI bleeding. No high-density material filling the stomach or other definite segment. Oral contrast is seen within nondilated colon where there are numerous left colonic  diverticula. There is an enteric tube in good position. No visible bowel inflammation. Vascular/Lymphatic: Limited without contrast. Aortic atherosclerosis. No retroperitoneal mass or adenopathy. There is extraperitoneal hematoma as described below. Reproductive:Unremarkable for age Other: Rectus sheath hematoma inferiorly on the left which measures 14 cm craniocaudal and 8 cm in thickness. Inferiorly, the hematoma dissects into the extraperitoneal space on the left more than right with mass effect on pelvic viscera. No hemoperitoneum or pneumoperitoneum. Musculoskeletal: Spondylosis and degenerative change without acute abnormalities. Critical Value/emergent results were called by telephone at the time of interpretation on 05/28/2020 at 4:41 am to provider Dr Duwayne Heck , who verbally acknowledged these results. IMPRESSION: 1. Large left rectus sheath hematoma dissecting into the extraperitoneal space. The hematoma measures 14 cm craniocaudal and 8 cm in thickness. 2. COVID pneumonia. 3. Atherosclerosis and colonic diverticulosis. Electronically Signed   By: Monte Fantasia M.D.   On: 05/28/2020 04:41   DG CHEST PORT 1 VIEW  Result Date: 05/29/2020 CLINICAL DATA:  Hypoxia EXAM: PORTABLE CHEST 1 VIEW COMPARISON:  Two days ago FINDINGS: Endotracheal tube with tip at the clavicular heads. The enteric tube tip reaches the stomach. Left IJ line with tip at the SVC. Interstitial and airspace opacity has developed from before. By abdominal CT yesterday this is ground-glass opacity in the setting of COVID positivity. Cardiomegaly accentuated by low lung volumes. No visible effusion or pneumothorax. IMPRESSION: 1. Unremarkable hardware positioning. 2. Bilateral pneumonia. Electronically Signed   By: Monte Fantasia M.D.   On: 05/29/2020 05:14   DG CHEST PORT 1 VIEW  Result Date: 05/27/2020 CLINICAL DATA:  COVID pneumonia on ventilator. EXAM: PORTABLE CHEST 1 VIEW COMPARISON:  05/27/2020 FINDINGS: The endotracheal tube,  NG tube and left IJ central venous catheters are stable. Patchy subtle  bilateral infiltrates slightly improved. No pleural effusions or pneumothorax. IMPRESSION: 1. Stable support apparatus. 2. Slight interval improvement in bilateral infiltrates. Electronically Signed   By: Marijo Sanes M.D.   On: 05/27/2020 20:16   Portable Chest x-ray  Result Date: 05/27/2020 CLINICAL DATA:  Shortness of breath, intubation. EXAM: PORTABLE CHEST 1 VIEW COMPARISON:  Chest radiograph dated 05/07/2020. FINDINGS: There is been interval placement of an endotracheal tube in the midthoracic trachea. An enteric tube terminates in the stomach. The heart size is normal. Vascular calcifications are seen in the aortic arch. Mild patchy bilateral airspace opacities appear similar to prior exam. There is no pleural effusion or pneumothorax. The visualized skeletal structures are unremarkable. IMPRESSION: 1. Endotracheal tube in appropriate position. 2. Unchanged mild patchy bilateral airspace opacities. Electronically Signed   By: Zerita Boers M.D.   On: 05/27/2020 14:19   DG Chest Port 1V same Day  Result Date: 05/27/2020 CLINICAL DATA:  Status post PICC placement. EXAM: PORTABLE CHEST 1 VIEW COMPARISON:  Earlier today FINDINGS: Left central line which may be an internal jugular line with tip in the region of the lower SVC. No visualized pneumothorax. Skin fold projects over the left chest. Endotracheal tube tip is approximately 3.1 cm from the carina, obscured by overlying EKG lead. Enteric tube in place with tip and side-port below the diaphragm in the stomach. Patchy heterogeneous bilateral airspace opacities in a mid-lower lung zone predominant distribution, unchanged. No evidence of pneumomediastinum. Remote right clavicle fracture. IMPRESSION: 1. Left central line with tip in the region of the lower SVC. No pneumothorax. 2. Endotracheal tube and enteric tube remain in place. 3. Unchanged bilateral heterogeneous airspace  opacities consistent with COVID pneumonia. Electronically Signed   By: Keith Rake M.D.   On: 05/27/2020 16:34   Medications:  sodium chloride     feeding supplement (VITAL AF 1.2 CAL) 45 mL/hr at 05/29/20 0200   fentaNYL infusion INTRAVENOUS 300 mcg/hr (05/29/20 1100)   midazolam 4 mg/hr (05/29/20 1100)   norepinephrine (LEVOPHED) Adult infusion 3 mcg/min (05/29/20 1100)    sodium chloride   Intravenous Once   chlorhexidine gluconate (MEDLINE KIT)  15 mL Mouth Rinse BID   Chlorhexidine Gluconate Cloth  6 each Topical Daily   docusate  100 mg Per Tube BID   feeding supplement (PROSource TF)  45 mL Per Tube TID   insulin aspart  0-20 Units Subcutaneous Q4H   insulin detemir  30 Units Subcutaneous BID   levothyroxine  175 mcg Per Tube Q0600   linagliptin  5 mg Per Tube Daily   mouth rinse  15 mL Mouth Rinse 10 times per day   methylPREDNISolone (SOLU-MEDROL) injection  40 mg Intravenous Q12H   pantoprazole sodium  40 mg Per Tube Daily   sodium bicarbonate  1,300 mg Oral BID   sodium chloride flush  10-40 mL Intracatheter Q12H   Assessment/Plan **hypoxic respiratory failure: secondary to COVID19.  Vent and therapies per PCCM.   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  Presume she has ATN on top of baseline CKD.  Started on prograf several months ago for persistant proteinuria but in setting of acute issues and shock have d/c'd due to vasoconstrictive effect (Also holding RAAS inhibition at this time).   She's been nonoliguric and creatinine slightly better today. No current indications for RRT but still at fairly high risk in the coming  days due to critical illness.  For today will dose with lasix 80 IV in an attempt to maintain euvolemia.  If needed can titrate up diuretic dosing.  I have discussed my medication recommendations and dialysis possibility  with daughter and she is in agreement to proceed if needed.  Requests a call prior to initiation of dialysis should it be necessary.  **Hyperkalemia: normalized after 2 doses lokelma, follow  **Metabolic acidosis: secondary to AKI, enteral bicarb ordered, follow  **ABLA: rectus hematoma, Hb  Improved s/p transfusion but drifting down again, transfusion and reimaging per primary.   Jannifer Hick MD 05/29/2020, 11:27 AM  [facility] Pager: [phone]

## 2020-05-29 NOTE — Progress Notes (Signed)
Critical ABG results given to Dr Tamala Julian [facility]- 7.09 PCO2- 81.8 PO2-113 HCO3-24.7 Recommended RR-28, Vt-350 & repeat ABG tonight

## 2020-05-29 NOTE — TOC Initial Note (Signed)
Transition of Care [facility]) - Initial/Assessment Note    Patient Details  Name: [name] MRN: [id number] Date of Birth: 06-19-1951  [facility]) CM/SW Contact:    Verdell Carmine, RN Phone Number: 05/29/2020, 12:36 PM  Clinical Narrative:                 Admitted with COVID /ARDS respiratory failure. Did not take vaccine. Daughter refusing remdisivir for patient as she does not believe in it. Patient proned, may need nibex as well due to dysncrony.  Still in acute Resp failure  SM will follow for needs.  Expected Discharge Plan: [place] Barriers to Discharge: Continued Medical Work up   Patient Goals and CMS Choice        Expected Discharge Plan and Services Expected Discharge Plan: [place]   Discharge Planning Services: CM Consult   Living arrangements for the past 2 months: Single Family Home                                      Prior Living Arrangements/Services Living arrangements for the past 2 months: Single Family Home Lives with:: Spouse Patient language and need for interpreter reviewed:: Yes        Need for Family Participation in Patient Care: Yes (Comment) Care giver support system in place?: Yes (comment)   Criminal Activity/Legal Involvement Pertinent to Current Situation/Hospitalization: No - Comment as needed  Activities of Daily Living Home Assistive Devices/Equipment: None ADL Screening (condition at time of admission) Patient's cognitive ability adequate to safely complete daily activities?: Yes Is the patient deaf or have difficulty hearing?: No Does the patient have difficulty seeing, even when wearing glasses/contacts?: No Does the patient have difficulty concentrating, remembering, or making decisions?: No Patient able to express need for assistance with ADLs?: Yes Does the patient have difficulty dressing or bathing?: No Independently performs ADLs?: Yes (appropriate for  developmental age) Does the patient have difficulty walking or climbing stairs?: No Weakness of Legs: None Weakness of Arms/Hands: None  Permission Sought/Granted                  Emotional Assessment Appearance:: Appears older than stated age Attitude/Demeanor/Rapport: Intubated (Following Commands or Not Following Commands) Affect (typically observed): Unable to Assess Orientation: :  (patient sedated and ventilated) Alcohol / Substance Use: Not Applicable Psych Involvement: No (comment)  Admission diagnosis:  Hypoxia [R09.02] Respiratory failure with hypoxia ([place]) [J96.91] COVID-19 virus infection [U07.1] Patient Active Problem List   Diagnosis Date Noted  . Hypotension   . Renal insufficiency   . COVID-19 virus infection   . Acute respiratory disease due to COVID-19 virus 05/23/2020  . Pneumonia due to COVID-19 virus 05/23/2020  . Respiratory failure with hypoxia ([place]) 05/06/2020  . Nausea vomiting and diarrhea 10/17/2018  . Hypokalemia 10/17/2018  . Acute renal failure superimposed on stage 3 chronic kidney disease ([place]) 10/17/2018  . Campylobacter enteritis 10/17/2018  . History of colonic polyps   . Diverticulosis of colon without hemorrhage   . DM (diabetes mellitus) with complications ([place]) 72/53/6644  . HTN (hypertension) 04/08/2013  . Membranous glomerulonephritis 04/08/2013  . Hypothyroidism 04/08/2013  . GERD (gastroesophageal reflux disease) 04/08/2013  . OSA (obstructive sleep apnea) 04/08/2013  . Hyperlipidemia 04/08/2013  . DM2 (diabetes mellitus, type 2) ([place]) 04/08/2013   PCP:  Sharilyn Sites, MD Pharmacy:   [facility]  [place] [place] [address] Phone: [phone] Fax: [phone]     Social Determinants of Health (SDOH) Interventions    Readmission Risk Interventions No flowsheet data found.

## 2020-05-29 NOTE — Progress Notes (Signed)
[facility] Progress Note Patient Name: [name] DOB: 1951/02/01 MRN: [id number]   Date of Service  05/29/2020  HPI/Events of Note  Ventilator asynchrony - Appears to be well sedated on Fentanyl and Propofol IV infusions.   eICU Interventions  Plan: 1. ABG STAT. 2. Portable CXR STAT.     Intervention Category Major Interventions: Respiratory failure - evaluation and management;Hypoxemia - evaluation and management  Lysle Dingwall 05/29/2020, 3:40 AM

## 2020-05-29 NOTE — Progress Notes (Signed)
NAME:  [name], MRN:  [id number], DOB:  1951/08/01, LOS: 7 ADMISSION DATE:  05/21/2020, CONSULTATION DATE:  10/5 REFERRING MD: Memon/ Triad, CHIEF COMPLAINT:  resp distress    Brief History   89 yowf unvaccinated   History of present illness    69 yowf , with history of T2DM, OSA, Hothyroidism, HTN, HLD, hepatitis, and GERD presents to the ED with a chief complaint of dyspnea.  Patient reports that symptom onset was 5 weeks ago, and started with diverticulitis.  Patient reports that since then she has had dyspnea and cough that started simultaneously.  The dyspnea and cough became worse over the last 2 weeks.  Patient reports that dyspnea is worse with exertion, and she can even walk now.  She reports that she fell on 9/29, which she attributes to being so weak.  She describes the fall as she was sitting in her chair got up took 2 steps and ended up on the ground.  She does not remember feeling lightheaded, or any preceding symptoms, and she did not hit her head.  Patient reports that she is weak because she has had decreased appetite and decreased p.o. intake since the diverticulitis 5 weeks pta.  Patient reports that her taste and smell are intact.  She does not have chest pain or palpitations.  She reports that her cough is a dry cough.  She had fevers for the first 3 weeks of her symptoms, but not for the last 2 weeks.  She reports that her T-max was 103.1, and Tylenol helps when she remembers to take it.  Patient has not had body aches, nausea vomiting, diarrhea, dysuria.  She does admit to shakiness, and vision changes over the last 2 weeks.  She reports that she cannot focus her eyes, so is blurry vision, but not double vision.  She does not have a sore throat and is not has clogged sinuses.  Patient did not get a Covid vaccine, because "I did not want 1."  Patient is refusing remdesivir today because her daughter is a "conspiracy therapist" and does not want her taking it.     Past Medical  History  DM  II OSA Hypothyroid HBP HLD GERD   Significant Hospital Events      Consults:  PCCM  10/5  Procedures:  Oral ET  10/5  L IJ CVL  10/5   Significant Diagnostic Tests:  CT Abd L large rectus sheath hematoma, COVID PNA on visible lung fields  Micro Data:  Covid 19  PCR  9/30  POS MRSA PCR  10/1 >   Neg   ET 10/5 >>>  ID Rx:  Baricitinib   10/3 >>>  Interim history/subjective:  Sedation weans continues to result in dysynchrony and desaturations.  Objective   Blood pressure 101/70, pulse 96, temperature 99.9 F (37.7 C), temperature source Axillary, resp. rate 12, height 5\' 1"  (1.549 m), weight 73.8 kg, SpO2 94 %. CVP:  [6 mmHg] 6 mmHg  Vent Mode: PRVC FiO2 (%):  [60 %-90 %] 80 % Set Rate:  [16 bmp] 16 bmp Vt Set:  [300 mL] 300 mL PEEP:  [10 cmH20-12 cmH20] 12 cmH20 Pressure Support:  [10 cmH20] 10 cmH20 Plateau Pressure:  [32 cmH20] 32 cmH20   Intake/Output Summary (Last 24 hours) at 05/29/2020 0930 Last data filed at 05/29/2020 0900 Gross per 24 hour  Intake 2232.15 ml  Output 1320 ml  Net 912.15 ml   Filed Weights   05/18/2020 1838 05/24/20  0500 05/29/20 0405  Weight: 78.9 kg 70.3 kg 73.8 kg   Constitutional: chronically ill woman in no acute distress Eyes: eyes are anicteric, reactive to light Ears, nose, mouth, and throat: mucous membranes moist, trachea midline, ETT minimal secretions Cardiovascular: heart sounds are regular, ext are warm to touch. no edema Respiratory: suprisingly clear, no wheezing Gastrointestinal: abdomen is soft with + BS Skin: No rashes, normal turgor Neurologic: with sedation wean moves all 4 ext but not consistently to command and desats Psychiatric: RASS -1  1200 uop/24h ABG hypoxemia, PEEP increased BUN/Cr slightly improved TG still up Hgb slightly down, don't think istats are accurate   Resolved Hospital Problem list    Hyperkalemia- resolved  Assessment & Plan:  Acute hypoxemic resp failure secondary to  Covid 19 Pna  - ARDSnet PEEP/FiO2, attempt to limit driving pressures as allowed by lung compliance - VAP prevention bundle -16/8h proning with P/f ratio < 150, adjust based on clinical response - Sedation to minimize shearing forces created by ventilator assynchrony - Continue methylprednisolone, family does not want remdesivir apparently due to concern for renal function deterioration.  Baricitinib contraindicated with her degree of renal dysfunction.  Actemra not good idea given baseline immunosuppression.  Today: heavier sedation, propofol to versed given high TG, recheck ABG, start proning if P/F ratio remains low; will need to consider PRN NMB  Acute on chronic renal failure- thought to be ATN on baseline CKD  - Nephrology following, thinks mostly ATN, appreciate Dr. Caprice Red help - [place] on hold - Potential need for HD discussed with daughter  ABLA secondary to Rectus sheath hematoma- spontaneous, Hgb 5>>10 after 2 unit pRBC, now stable at 8 g/dL - Trend H/H - Hold AC for 1 more day, consider rechallenge tomorrow given high risk of VTE  Baseline uncontrolled DM2 now exacerbated by steroids - Restart home levemir + SSI, titrate for CBG 140-180   Hypothyroid- synthroid PTA  Best practice:  Diet: start TF Pain/Anxiety/Delirium protocol (if indicated):  Precedex/ fentanyl/ propofol VAP protocol (if indicated): in place DVT prophylaxis: SCDs given ABLA GI prophylaxis: ppi Glucose control: see above Mobility: bed rest  Code Status: full Family Communication: will update daughter Disposition: ICU pending vent liberation    The patient is critically ill with multiple organ systems failure and requires high complexity decision making for assessment and support, frequent evaluation and titration of therapies, application of advanced monitoring technologies and extensive interpretation of multiple databases. Critical Care Time devoted to patient care services described in this note  independent of APP/resident time (if applicable)  is 32 minutes.   Erskine Emery MD [facility] 05/29/2020 9:30 AM Personal pager: [phone] If unanswered, please page CCM On-call: [phone]

## 2020-05-29 NOTE — Progress Notes (Signed)
[facility] Progress Note Patient Name: [name] DOB: 25-Jul-1951 MRN: [id number]   Date of Service  05/29/2020  HPI/Events of Note  ABG on 90%/PRVC 16/TV 300/P 10 = 7.403/28.6/58  eICU Interventions  Plan: 1. Increase PEEP to 12.      Intervention Category Major Interventions: Respiratory failure - evaluation and management  Oiva Dibari Eugene 05/29/2020, 4:51 AM

## 2020-05-30 ENCOUNTER — Inpatient Hospital Stay (HOSPITAL_COMMUNITY): Payer: Medicare HMO

## 2020-05-30 DIAGNOSIS — J9601 Acute respiratory failure with hypoxia: Secondary | ICD-10-CM | POA: Diagnosis not present

## 2020-05-30 DIAGNOSIS — U071 COVID-19: Secondary | ICD-10-CM | POA: Diagnosis not present

## 2020-05-30 LAB — CBC
HCT: 22.5 % — ABNORMAL LOW (ref 36.0–46.0)
Hemoglobin: 7.4 g/dL — ABNORMAL LOW (ref 12.0–15.0)
MCH: 30.6 pg (ref 26.0–34.0)
MCHC: 32.9 g/dL (ref 30.0–36.0)
MCV: 93 fL (ref 80.0–100.0)
Platelets: 152 10*3/uL (ref 150–400)
RBC: 2.42 MIL/uL — ABNORMAL LOW (ref 3.87–5.11)
RDW: 15.4 % (ref 11.5–15.5)
WBC: 10.9 10*3/uL — ABNORMAL HIGH (ref 4.0–10.5)
nRBC: 0 % (ref 0.0–0.2)

## 2020-05-30 LAB — POCT I-STAT 7, (LYTES, BLD GAS, ICA,H+H)
Acid-base deficit: 3 mmol/L — ABNORMAL HIGH (ref 0.0–2.0)
Bicarbonate: 23.2 mmol/L (ref 20.0–28.0)
Calcium, Ion: 1.27 mmol/L (ref 1.15–1.40)
HCT: 20 % — ABNORMAL LOW (ref 36.0–46.0)
Hemoglobin: 6.8 g/dL — CL (ref 12.0–15.0)
O2 Saturation: 86 %
Potassium: 4.4 mmol/L (ref 3.5–5.1)
Sodium: 146 mmol/L — ABNORMAL HIGH (ref 135–145)
TCO2: 25 mmol/L (ref 22–32)
pCO2 arterial: 44.7 mmHg (ref 32.0–48.0)
pH, Arterial: 7.324 — ABNORMAL LOW (ref 7.350–7.450)
pO2, Arterial: 55 mmHg — ABNORMAL LOW (ref 83.0–108.0)

## 2020-05-30 LAB — GLUCOSE, CAPILLARY
Glucose-Capillary: 162 mg/dL — ABNORMAL HIGH (ref 70–99)
Glucose-Capillary: 180 mg/dL — ABNORMAL HIGH (ref 70–99)
Glucose-Capillary: 181 mg/dL — ABNORMAL HIGH (ref 70–99)
Glucose-Capillary: 186 mg/dL — ABNORMAL HIGH (ref 70–99)
Glucose-Capillary: 193 mg/dL — ABNORMAL HIGH (ref 70–99)
Glucose-Capillary: 228 mg/dL — ABNORMAL HIGH (ref 70–99)

## 2020-05-30 LAB — BASIC METABOLIC PANEL
Anion gap: 11 (ref 5–15)
BUN: 96 mg/dL — ABNORMAL HIGH (ref 8–23)
CO2: 22 mmol/L (ref 22–32)
Calcium: 8.3 mg/dL — ABNORMAL LOW (ref 8.9–10.3)
Chloride: 113 mmol/L — ABNORMAL HIGH (ref 98–111)
Creatinine, Ser: 3.45 mg/dL — ABNORMAL HIGH (ref 0.44–1.00)
GFR calc non Af Amer: 13 mL/min — ABNORMAL LOW (ref >60)
Glucose, Bld: 215 mg/dL — ABNORMAL HIGH (ref 70–99)
Potassium: 4.7 mmol/L (ref 3.5–5.1)
Sodium: 146 mmol/L — ABNORMAL HIGH (ref 135–145)

## 2020-05-30 LAB — CULTURE, RESPIRATORY W GRAM STAIN: Special Requests: NORMAL

## 2020-05-30 MED ORDER — INSULIN ASPART 100 UNIT/ML ~~LOC~~ SOLN
8.0000 [IU] | SUBCUTANEOUS | Status: DC
  Administered 2020-05-30 – 2020-06-01 (×12): 8 [IU] via SUBCUTANEOUS

## 2020-05-30 MED ORDER — FUROSEMIDE 10 MG/ML IJ SOLN
60.0000 mg | Freq: Once | INTRAMUSCULAR | Status: AC
  Administered 2020-05-30: 60 mg via INTRAVENOUS
  Filled 2020-05-30: qty 6

## 2020-05-30 MED ORDER — ARTIFICIAL TEARS OPHTHALMIC OINT
TOPICAL_OINTMENT | Freq: Three times a day (TID) | OPHTHALMIC | Status: DC
  Administered 2020-06-02 – 2020-06-13 (×8): 1 via OPHTHALMIC
  Filled 2020-05-30 (×4): qty 3.5

## 2020-05-30 NOTE — Progress Notes (Signed)
[facility] Progress Note   Subjective: Req'd ^ MV last night - ABG improved.  BUN in 90s (stable), Cr 3.5 (stable) I/Os yesterday:  2.1 / 2.1 all UOP  Objective Vitals:   05/30/20 0722 05/30/20 0800 05/30/20 0900 05/30/20 0910  BP:  (!) 101/57 (!) 108/57   Pulse:  91 86   Resp:  (!) 28 20   Temp: 99.4 F (37.4 C)     TempSrc: Axillary     SpO2:  93% 93%   Weight:      Height:    (P) _0  (1.549 m)   Physical Exam Gen: Ill appearing, intubated and sedated, proned CV:  RRR on monitor Lungs: coarse BS bilaterally, no rales  Extr: no edema  Additional Objective Labs: Basic Metabolic Panel: Recent Labs  Lab 05/28/20 0512 05/28/20 1812 05/29/20 0348 05/29/20 0430 05/29/20 1644 05/29/20 1820 05/29/20 2024 05/30/20 0135 05/30/20 0455  NA   < >  --  144   < > 145   < > 145 146* 146*  K   < >  --  4.4   < > 4.9   < > 4.8 4.4 4.7  CL   < >  --  112*  --  113*  --   --   --  113*  CO2   < >  --  18*  --  20*  --   --   --  22  GLUCOSE   < >  --  249*  --  329*  --   --   --  215*  BUN   < >  --  90*  --  89*  --   --   --  96*  CREATININE   < >  --  3.66*  --  3.44*  --   --   --  3.45*  CALCIUM   < >  --  7.9*  --  8.0*  --   --   --  8.3*  PHOS  --  7.9* 7.2*  --  6.4*  --   --   --   --    < > = values in this interval not displayed.   Liver Function Tests: Recent Labs  Lab 05/25/20 0444 05/26/20 0439 05/27/20 0557  AST 35 178* 72*  ALT 14 52* 47*  ALKPHOS 38 71 67  BILITOT 0.3 0.5 0.7  PROT 6.1* 6.1* 5.7*  ALBUMIN 2.2* 2.2* 2.6*   No results for input(s): LIPASE, AMYLASE in the last 168 hours. CBC: Recent Labs  Lab 05/25/20 0444 05/25/20 0444 05/26/20 0439 05/26/20 0439 05/27/20 0557 05/27/20 1716 05/28/20 0512 05/28/20 0512 05/28/20 1323 05/28/20 1323 05/28/20 1812 05/28/20 1812 05/29/20 0348 05/29/20 0430 05/29/20 2024 05/30/20 0135 05/30/20 0455  WBC 10.9*   < > 11.9*   < > 15.5*   < > 21.7*   < > 20.5*   < > 18.8*  --   17.7*  --   --   --  10.9*  NEUTROABS 9.3*  --  10.1*  --  12.8*  --   --   --   --   --   --   --   --   --   --   --   --   HGB 9.9*   < > 10.3*   < > 8.2*   < > 10.2*   < > 9.5*   < > 8.9*   < > 8.3*   < >  7.8* 6.8* 7.4*  HCT 29.4*   < > 30.8*   < > 24.5*   < > 30.1*   < > 28.2*   < > 26.3*   < > 24.9*   < > 23.0* 20.0* 22.5*  MCV 92.7   < > 93.9   < > 95.0   < > 89.9  --  89.5  --  89.2  --  90.5  --   --   --  93.0  PLT 322   < > 320   < > 319   < > 317   < > 301   < > 246  --  232  --   --   --  152   < > = values in this interval not displayed.   Blood Culture    Component Value Date/Time   SDES TRACHEAL ASPIRATE 05/28/2020 0912   SPECREQUEST Normal 05/28/2020 0912   CULT  05/28/2020 0912    RARE YEAST IDENTIFICATION TO FOLLOW Performed at [facility], [place] [address], [place], [place] [address]    REPTSTATUS PENDING 05/28/2020 2778    Cardiac Enzymes: No results for input(s): CKTOTAL, CKMB, CKMBINDEX, TROPONINI in the last 168 hours. CBG: Recent Labs  Lab 05/29/20 1625 05/29/20 1933 05/29/20 2322 05/30/20 0336 05/30/20 0719  GLUCAP 314* 275* 232* 186* 180*   Iron Studies:  No results for input(s): IRON, TIBC, TRANSFERRIN, FERRITIN in the last 72 hours. _0 @ Studies/Results: DG CHEST PORT 1 VIEW  Result Date: 05/29/2020 CLINICAL DATA:  Hypoxia EXAM: PORTABLE CHEST 1 VIEW COMPARISON:  Two days ago FINDINGS: Endotracheal tube with tip at the clavicular heads. The enteric tube tip reaches the stomach. Left IJ line with tip at the SVC. Interstitial and airspace opacity has developed from before. By abdominal CT yesterday this is ground-glass opacity in the setting of COVID positivity. Cardiomegaly accentuated by low lung volumes. No visible effusion or pneumothorax. IMPRESSION: 1. Unremarkable hardware positioning. 2. Bilateral pneumonia. Electronically Signed   By: Monte Fantasia M.D.   On: 05/29/2020 05:14   Medications: . sodium chloride    . feeding  supplement (VITAL AF 1.2 CAL) 1,000 mL (05/29/20 1634)  . fentaNYL infusion INTRAVENOUS 400 mcg/hr (05/30/20 0900)  . midazolam 10 mg/hr (05/30/20 0900)  . norepinephrine (LEVOPHED) Adult infusion Stopped (05/30/20 0144)   . sodium chloride   Intravenous Once  . artificial tears   Both Eyes Q8H  . chlorhexidine gluconate (MEDLINE KIT)  15 mL Mouth Rinse BID  . Chlorhexidine Gluconate Cloth  6 each Topical Daily  . docusate  100 mg Per Tube BID  . feeding supplement (PROSource TF)  45 mL Per Tube TID  . insulin aspart  0-20 Units Subcutaneous Q4H  . insulin aspart  8 Units Subcutaneous Q4H  . insulin detemir  30 Units Subcutaneous BID  . levothyroxine  175 mcg Per Tube Q0600  . linagliptin  5 mg Per Tube Daily  . mouth rinse  15 mL Mouth Rinse 10 times per day  . methylPREDNISolone (SOLU-MEDROL) injection  40 mg Intravenous Q12H  . pantoprazole sodium  40 mg Per Tube Daily  . sodium bicarbonate  1,300 mg Per Tube BID  . sodium chloride flush  10-40 mL Intracatheter Q12H   Assessment/Plan **hypoxic respiratory failure: secondary to [place].  Vent and therapies per PCCM.   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  Presume she has ATN on top of baseline CKD.  UP/C < 2 --> not consistent with florid nephrosis/flare of membranous.  Started on prograf several months ago for persistant proteinuria but in setting of acute issues and shock have d/c'd due to vasoconstrictive effect (Also holding RAAS inhibition at this time).   Renal function stable currently with Cr 3.5.  No indications for dialysis.  Should need arise family is ok with it but have asked to be notified prior to initiation. Will use diuretics prn to maintain euvolemia - give lasix 60 IV today.   **Hyperkalemia: normalized after 2 doses lokelma, follow  **Mixed aacidosis: secondary to AKI, enteral bicarb ordered,  vent changes per primary.   **ABLA: rectus hematoma, Hb  Improved s/p transfusion and appears stable in the 7s now, transfusion and reimaging per primary.    Will follow  Jannifer Hick MD 05/30/2020, 9:42 AM  [facility] Pager: [phone]

## 2020-05-30 NOTE — Progress Notes (Signed)
NAME:  [name], MRN:  [id number], DOB:  02-14-1951, LOS: 8 ADMISSION DATE:  05/14/2020, CONSULTATION DATE:  10/5 REFERRING MD: Memon/ Triad, CHIEF COMPLAINT:  resp distress    Brief History   80 yowf unvaccinated   History of present illness    59 yowf , with history of T2DM, OSA, Hothyroidism, HTN, HLD, hepatitis, and GERD presents to the ED with a chief complaint of dyspnea.  Patient reports that symptom onset was 5 weeks ago, and started with diverticulitis.  Patient reports that since then she has had dyspnea and cough that started simultaneously.  The dyspnea and cough became worse over the last 2 weeks.  Patient reports that dyspnea is worse with exertion, and she can even walk now.  She reports that she fell on 9/29, which she attributes to being so weak.  She describes the fall as she was sitting in her chair got up took 2 steps and ended up on the ground.  She does not remember feeling lightheaded, or any preceding symptoms, and she did not hit her head.  Patient reports that she is weak because she has had decreased appetite and decreased p.o. intake since the diverticulitis 5 weeks pta.  Patient reports that her taste and smell are intact.  She does not have chest pain or palpitations.  She reports that her cough is a dry cough.  She had fevers for the first 3 weeks of her symptoms, but not for the last 2 weeks.  She reports that her T-max was 103.1, and Tylenol helps when she remembers to take it.  Patient has not had body aches, nausea vomiting, diarrhea, dysuria.  She does admit to shakiness, and vision changes over the last 2 weeks.  She reports that she cannot focus her eyes, so is blurry vision, but not double vision.  She does not have a sore throat and is not has clogged sinuses.  Patient did not get a Covid vaccine, because "I did not want 1."  Patient is refusing remdesivir.  Past Medical History  DM  II OSA Hypothyroid HBP HLD GERD   Significant Hospital Events       Consults:  PCCM  10/5  Procedures:  Oral ET  10/5  L IJ CVL  10/5   Significant Diagnostic Tests:  CT Abd L large rectus sheath hematoma, COVID PNA on visible lung fields  Micro Data:  Covid 19  PCR  9/30  POS MRSA PCR  10/1 >   Neg   ET 10/5 >>>  ID Rx:  Baricitinib 10/3 >  Interim history/subjective:  Sedated and prone  No acute issues overnight received on dose of NMB last night and again this AM  Objective   Blood pressure (!) 107/56, pulse (!) 101, temperature 98 F (36.7 C), temperature source Axillary, resp. rate (!) 30, height '5\' 1"'  (1.549 m), weight 73.8 kg, SpO2 100 %.    Vent Mode: PRVC FiO2 (%):  [60 %-100 %] 90 % Set Rate:  [16 bmp-30 bmp] 30 bmp Vt Set:  [300 mL-350 mL] 350 mL PEEP:  [10 HYQ65-78 cmH20] 12 cmH20 Plateau Pressure:  [20 cmH20-24 cmH20] 24 cmH20   Intake/Output Summary (Last 24 hours) at 05/30/2020 1404 Last data filed at 05/30/2020 1400 Gross per 24 hour  Intake 2023.23 ml  Output 1995 ml  Net 28.23 ml   Filed Weights   05/10/2020 1838 05/24/20 0500 05/29/20 0405  Weight: 78.9 kg 70.3 kg 73.8 kg   Physical exam  General: Acute on chronically ill appearing elderly female proned on mechanical ventilation, in NAD HEENT: ETT, MM pink/moist, PERRL,  Neuro: Sedated and prone  CV: s1s2 regular rate and rhythm, no murmur, rubs, or gallops,  PULM:  Posterior breath sounds clear, currently compliant on vent with last NMB this AM GI: soft, bowel sounds active in all 4 quadrants, non-tender, non-distended, tolerating TF Extremities: warm/dry, no edema  Skin: no rashes or lesions  Resolved Hospital Problem list    Hyperkalemia- resolved  Assessment & Plan:   Acute hypoxic/hypercapnic respiratory failure and sepsis (POA) due to severe ARDS from COVID19 pneumonia -Continues to met criteria for sever ARDS with P/F ratio of 61 (mortality of 45%)  Acute respiratory acidosis  -Patient continue to have PCO2 in the 44.7 pH 7.32 P: Continue  ventilator support with lung protective strategies per ARDS protocol  Wean PEEP and FiO2 for sats greater than 88%. Permissive hypercapnia, pH goal 7.2 or better. Head of bed elevated 30 degrees. Plateau pressures less than 30 cm H20 as able, currently 30 Driving pressure goal less than 15, currently 18 Continue 16/8 proning with P/F ration < 150 Follow intermittent chest x-ray and ABG Ensure adequate pulmonary hygiene  Follow cultures  VAP bundle in place  PAD protocol with RASS goal -4; continuous fentanyl and versed, PRN NMB Nephrology redosed lasix today  Continue Baricitinib and methylprednisolone, family dose not wasn't Remdesivir  Acute on chronic renal failure - thought to be ATN on baseline CKD P: Nephrology following  [place] remains on hold Follow renal function / urine output Trend Bmet Avoid nephrotoxins, ensure adequate renal perfusion  Family is agreeable for HD if needed   ABLA secondary to Rectus sheath hematoma seen on CT 10/6 - spontaneous, Hgb 5>>10 after 2 unit pRBC, now stable at 8 g/dL -Prophylactic subq heparin prescribed at time of bleed  P: Trend H/H Monitor for further bleeding Hgb goal > 7 Will discuss with attending plan for resumption of anticoagulation  Baseline uncontrolled DM2 now exacerbated by steroids P: Continue SSI and TF coverage CBG improved with increased levimir CBG q4hrs  CBG goal 140-180  Hypothyroid P: Continue home synthroid   Best practice:  Diet: start TF Pain/Anxiety/Delirium protocol (if indicated):  Precedex/ fentanyl/ propofol VAP protocol (if indicated): in place DVT prophylaxis: SCDs given ABLA GI prophylaxis: ppi Glucose control: see above Mobility: bed rest  Code Status: full Family Communication: will update daughter Disposition: ICU pending vent liberation   CRITICAL CARE Performed by: Johnsie Cancel  Total critical care time: 40 minutes  Critical care time was exclusive of separately billable  procedures and treating other patients.  Critical care was necessary to treat or prevent imminent or life-threatening deterioration.  Critical care was time spent personally by me on the following activities: development of treatment plan with patient and/or surrogate as well as nursing, discussions with consultants, evaluation of patient's response to treatment, examination of patient, obtaining history from patient or surrogate, ordering and performing treatments and interventions, ordering and review of laboratory studies, ordering and review of radiographic studies, pulse oximetry and re-evaluation of patient's condition.  Johnsie Cancel, NP-C [facility] Contact / Pager information can be found on Amion  05/30/2020, 2:18 PM

## 2020-05-30 NOTE — Progress Notes (Signed)
Patient's head & arms rotated, now lying left side face down

## 2020-05-30 NOTE — Progress Notes (Signed)
Pt's head turned and arms rotated with no complications.  ETT secured

## 2020-05-30 NOTE — Progress Notes (Signed)
Patient placed in prone position with face lying right side down & left arm up in swimming position.

## 2020-05-31 ENCOUNTER — Inpatient Hospital Stay (HOSPITAL_COMMUNITY): Payer: Medicare HMO

## 2020-05-31 DIAGNOSIS — J9601 Acute respiratory failure with hypoxia: Secondary | ICD-10-CM | POA: Diagnosis not present

## 2020-05-31 DIAGNOSIS — J069 Acute upper respiratory infection, unspecified: Secondary | ICD-10-CM | POA: Diagnosis not present

## 2020-05-31 DIAGNOSIS — U071 COVID-19: Secondary | ICD-10-CM | POA: Diagnosis not present

## 2020-05-31 LAB — POCT I-STAT 7, (LYTES, BLD GAS, ICA,H+H)
Acid-Base Excess: 1 mmol/L (ref 0.0–2.0)
Bicarbonate: 27 mmol/L (ref 20.0–28.0)
Calcium, Ion: 1.33 mmol/L (ref 1.15–1.40)
HCT: 19 % — ABNORMAL LOW (ref 36.0–46.0)
Hemoglobin: 6.5 g/dL — CL (ref 12.0–15.0)
O2 Saturation: 98 %
Potassium: 4.6 mmol/L (ref 3.5–5.1)
Sodium: 149 mmol/L — ABNORMAL HIGH (ref 135–145)
TCO2: 29 mmol/L (ref 22–32)
pCO2 arterial: 52.9 mmHg — ABNORMAL HIGH (ref 32.0–48.0)
pH, Arterial: 7.317 — ABNORMAL LOW (ref 7.350–7.450)
pO2, Arterial: 121 mmHg — ABNORMAL HIGH (ref 83.0–108.0)

## 2020-05-31 LAB — BASIC METABOLIC PANEL
Anion gap: 12 (ref 5–15)
BUN: 113 mg/dL — ABNORMAL HIGH (ref 8–23)
CO2: 25 mmol/L (ref 22–32)
Calcium: 8.9 mg/dL (ref 8.9–10.3)
Chloride: 114 mmol/L — ABNORMAL HIGH (ref 98–111)
Creatinine, Ser: 3.5 mg/dL — ABNORMAL HIGH (ref 0.44–1.00)
GFR, Estimated: 13 mL/min — ABNORMAL LOW (ref >60)
Glucose, Bld: 136 mg/dL — ABNORMAL HIGH (ref 70–99)
Potassium: 4.7 mmol/L (ref 3.5–5.1)
Sodium: 151 mmol/L — ABNORMAL HIGH (ref 135–145)

## 2020-05-31 LAB — CBC
HCT: 23.9 % — ABNORMAL LOW (ref 36.0–46.0)
HCT: 22.5 % — ABNORMAL LOW (ref 36.0–46.0)
Hemoglobin: 7.8 g/dL — ABNORMAL LOW (ref 12.0–15.0)
Hemoglobin: 7.4 g/dL — ABNORMAL LOW (ref 12.0–15.0)
MCH: 31.1 pg (ref 26.0–34.0)
MCH: 31 pg (ref 26.0–34.0)
MCHC: 32.6 g/dL (ref 30.0–36.0)
MCHC: 32.9 g/dL (ref 30.0–36.0)
MCV: 95.2 fL (ref 80.0–100.0)
MCV: 94.1 fL (ref 80.0–100.0)
Platelets: 147 10*3/uL — ABNORMAL LOW (ref 150–400)
Platelets: 145 K/uL — ABNORMAL LOW (ref 150–400)
RBC: 2.51 MIL/uL — ABNORMAL LOW (ref 3.87–5.11)
RBC: 2.39 MIL/uL — ABNORMAL LOW (ref 3.87–5.11)
RDW: 15.1 % (ref 11.5–15.5)
RDW: 14.8 % (ref 11.5–15.5)
WBC: 11.4 10*3/uL — ABNORMAL HIGH (ref 4.0–10.5)
WBC: 13.7 K/uL — ABNORMAL HIGH (ref 4.0–10.5)
nRBC: 0 % (ref 0.0–0.2)
nRBC: 0 % (ref 0.0–0.2)

## 2020-05-31 LAB — GLUCOSE, CAPILLARY
Glucose-Capillary: 124 mg/dL — ABNORMAL HIGH (ref 70–99)
Glucose-Capillary: 138 mg/dL — ABNORMAL HIGH (ref 70–99)
Glucose-Capillary: 158 mg/dL — ABNORMAL HIGH (ref 70–99)
Glucose-Capillary: 175 mg/dL — ABNORMAL HIGH (ref 70–99)
Glucose-Capillary: 85 mg/dL (ref 70–99)
Glucose-Capillary: 92 mg/dL (ref 70–99)

## 2020-05-31 MED ORDER — FREE WATER
200.0000 mL | Freq: Three times a day (TID) | Status: DC
  Administered 2020-05-31 – 2020-06-01 (×3): 200 mL

## 2020-05-31 NOTE — Progress Notes (Signed)
Pt supine at this time by 4 RNs and  1 RT with no complications. Cloth tape and foam pads removed from face.  No breakdown noted on cheeks, upper lip or inside mouth.  ETT secured at 22 at the lips with commercial tube holder,  Pt tolerated well.

## 2020-05-31 NOTE — Progress Notes (Signed)
NAME:  [name], MRN:  [id number], DOB:  05/08/1951, LOS: 9 ADMISSION DATE:  05/20/2020, CONSULTATION DATE:  10/5 REFERRING MD: Memon/ Triad, CHIEF COMPLAINT:  resp distress    Brief History     69 yowf , with history of T2DM, OSA, Hothyroidism, HTN, HLD, hepatitis, and GERD presents to the ED with a chief complaint of dyspnea.  Patient reports that symptom onset was 5 weeks ago, and started with diverticulitis.  Patient reports that since then she has had dyspnea and cough that started simultaneously.  The dyspnea and cough became worse over the last 2 weeks.  Patient reports that dyspnea is worse with exertion, and she can even walk now.  She reports that she fell on 9/29, which she attributes to being so weak.  She describes the fall as she was sitting in her chair got up took 2 steps and ended up on the ground.  She does not remember feeling lightheaded, or any preceding symptoms, and she did not hit her head.  Patient reports that she is weak because she has had decreased appetite and decreased p.o. intake since the diverticulitis 5 weeks pta.  Patient reports that her taste and smell are intact.  She does not have chest pain or palpitations.  She reports that her cough is a dry cough.  She had fevers for the first 3 weeks of her symptoms, but not for the last 2 weeks.  She reports that her T-max was 103.1, and Tylenol helps when she remembers to take it.  Patient has not had body aches, nausea vomiting, diarrhea, dysuria.  She does admit to shakiness, and vision changes over the last 2 weeks.  She reports that she cannot focus her eyes, so is blurry vision, but not double vision.  She does not have a sore throat and is not has clogged sinuses.  Patient did not get a Covid vaccine, because "I did not want 1."  Patient is refusing remdesivir.  Past Medical History  DM  II OSA Hypothyroid HBP HLD GERD   Significant Hospital Events   05/08/2020 - admit 05/30/20 - Sedated and prone . No acute  issues overnight received on dose of NMB last night and again this AM   Consults:  PCCM  10/5  Procedures:  Oral ET  10/5  L IJ CVL  10/5   Significant Diagnostic Tests:  CT Abd L large rectus sheath hematoma, COVID PNA on visible lung fields  Micro Data:  Covid 19  PCR  9/30  POS MRSA PCR  10/1 >   Neg   Blood 05/27/2020: Negative Tracheal aspirate 05/28/2020: Rare Candida blood culture   ID Rx:  Baricitinib 10/3 >  Interim history/subjective:    05/31/2020: On fentanyl infusion, Versed infusion.  Off Levophed infusion and then back on.  On tube feeds.  On the ventilator FiO2 60%. Peep 12 -> pulse ox 95%. Afebrile.  Supine since 4am. PF ratio 201   Objective   Blood pressure (!) 95/48, pulse 83, temperature 98.1 F (36.7 C), temperature source Axillary, resp. rate (!) 30, height 5\' 1"  (1.549 m), weight 74.1 kg, SpO2 96 %.    Vent Mode: PRVC FiO2 (%):  [60 %-90 %] 60 % Set Rate:  [30 bmp] 30 bmp Vt Set:  [350 mL] 350 mL PEEP:  [12 cmH20] 12 cmH20 Plateau Pressure:  [24 cmH20-27 cmH20] 24 cmH20   Intake/Output Summary (Last 24 hours) at 05/31/2020 1247 Last data filed at 05/31/2020 1200 Gross  per 24 hour  Intake 2192.25 ml  Output 2300 ml  Net -107.75 ml   Filed Weights   05/24/20 0500 05/29/20 0405 05/31/20 0500  Weight: 70.3 kg 73.8 kg 74.1 kg   Physical exam  General Appearance:  Looks criticall ill  Head:  Normocephalic, without obvious abnormality, atraumatic Eyes:  PERRL - yes, conjunctiva/corneas - muddy     Ears:  Normal external ear canals, both ears Nose:  G tube - no Throat:  ETT TUBE - yed , OG tube - yes Neck:  Supple,  No enlargement/tenderness/nodules Lungs: Clear to auscultation bilaterally, Ventilator   Synchrony - yes Heart:  S1 and S2 normal, no murmur, CVP - x.  Pressors - no Abdomen:  Soft, no masses, no organomegaly Genitalia / Rectal:  Not done Extremities:  Extremities- intact Skin:  ntact in exposed areas . Sacral area - not  examined Neurologic:  Sedation - fent gtt/versed gtt -> RASS - -4     Resolved Hospital Problem list    Hyperkalemia- resolved  Assessment & Plan:   Acute hypoxic/hypercapnic respiratory failure and sepsis (POA) due to severe ARDS from COVID19 pneumonia  05/31/2020 - > does not meet criteria for SBT/Extubation in setting of Acute Respiratory Failure due to ARDS but PF ratio is 201    P: No need to reprone 05/31/2020 due to PF Ratio > 150 ARDS Measure  - Target TVol 6-8cc/kgIBW  - Target Plateau Pressure < 30cm H20  - Target driving pressure less than 15 cm of water  - Target PaO2 55-65: titrate PEEP/FiO2 per protocol  - As long as PaO2 to FiO2 ratio is less than 1:150 position in prone position for 16 hours a day   COVID -19  Plan Continue Baricitinib and methylprednisolone, family dose not wan't Remdesivir  Acute on chronic renal failure - - thought to be ATN on baseline CKD  05/31/2020 - creat 3.5 and better. Na still high   P: Start free water Nephrology following  Tacro remains on hold Follow renal function / urine output Trend Bmet Avoid nephrotoxins, ensure adequate renal perfusion  Family is agreeable for HD if needed   ABLA secondary to Rectus sheath hematoma seen on CT 10/6 - spontaneous, Hgb 5>>10 after 2 unit pRBC, now stable at 8 g/dL . Was on prophylactic subq heparin prescribed at time of bleed   05/31/2020 - istat hgb < 7 and no active bleed -> repeat in lab 7.8g%   P: -rrechedk cbc - hold of heparin dvt proph - PRBC for hgb </= 6.9gm%    - exceptions are   -  if ACS susepcted/confirmed then transfuse for hgb </= 8.0gm%,  or    -  active bleeding with hemodynamic instability, then transfuse regardless of hemoglobin value   At at all times try to transfuse 1 unit prbc as possible with exception of active hemorrhage   Platelets  05/31/2020 -  Plat < 150k   Plan  scd and moitor  Baseline uncontrolled DM2 now exacerbated by  steroids P: Continue SSI and TF coverage CBG improved with increased levimir CBG q4hrs  CBG goal 140-180  Hypothyroid P: Continue home synthroid   MISC Device  05/31/2020  - concern on CXR that "left subclav had herniated into Left IJ"  -> is actually a L IJ line and works fine per [employer]  - monitor  Best practice:  Diet: start TF Pain/Anxiety/Delirium protocol (if indicated):  PAD VAP protocol (if indicated): in place DVT  prophylaxis: SCDs given ABLA GI prophylaxis: ppi Glucose control: see above Mobility: bed rest  Code Status: full Family Communication: husband Warehouse manager called 1:18 PM  - went to VM - LM saying I will call dtr. Then called dtr [place] [phone] - kept ringing without answer. RN said they called earlier and got update from family Disposition: ICU     ATTESTATION & SIGNATURE   The patient [name] is critically ill with multiple organ systems failure and requires high complexity decision making for assessment and support, frequent evaluation and titration of therapies, application of advanced monitoring technologies and extensive interpretation of multiple databases.   Critical Care Time devoted to patient care services described in this note is  60  Minutes. This time reflects time of care of this signee Dr Brand Males. This critical care time does not reflect procedure time, or teaching time or supervisory time of PA/NP/Med student/Med Resident etc but could involve care discussion time     Dr. Brand Males, M.D., [facility].C.P Pulmonary and Critical Care Medicine Staff Physician [facility] Pager: [phone], If no answer or between  15:00h - 7:00h: call [phone]  05/31/2020 12:47 PM    LABS    PULMONARY Recent Labs  Lab 05/29/20 0430 05/29/20 1820 05/29/20 2024 05/30/20 0135 05/31/20 0536  PHART 7.403 7.091* 7.232* 7.324* 7.317*  PCO2ART 28.6* 81.8* 56.4* 44.7  52.9*  PO2ART 58* 113* 82* 55* 121*  HCO3 17.8* 24.7 23.7 23.2 27.0  TCO2 19* 27 25 25 29   O2SAT 90.0 95.0 93.0 86.0 98.0    CBC Recent Labs  Lab 05/29/20 0348 05/29/20 0430 05/30/20 0455 05/31/20 0350 05/31/20 0536  HGB 8.3*   < > 7.4* 7.8* 6.5*  HCT 24.9*   < > 22.5* 23.9* 19.0*  WBC 17.7*  --  10.9* 11.4*  --   PLT 232  --  152 147*  --    < > = values in this interval not displayed.    COAGULATION Recent Labs  Lab 05/27/20 2024  INR 1.5*    CARDIAC  No results for input(s): TROPONINI in the last 168 hours. No results for input(s): PROBNP in the last 168 hours.   CHEMISTRY Recent Labs  Lab 05/28/20 0512 05/28/20 0512 05/28/20 1323 05/28/20 1812 05/29/20 0348 05/29/20 0430 05/29/20 1644 05/29/20 1820 05/29/20 2024 05/29/20 2024 05/30/20 0135 05/30/20 0135 05/30/20 0455 05/30/20 0455 05/31/20 0350 05/31/20 0536  NA 141   < >  --   --  144   < > 145   < > 145  --  146*  --  146*  --  151* 149*  K 5.2*   < >  --   --  4.4   < > 4.9   < > 4.8   < > 4.4   < > 4.7   < > 4.7 4.6  CL 111  --   --   --  112*  --  113*  --   --   --   --   --  113*  --  114*  --   CO2 16*  --   --   --  18*  --  20*  --   --   --   --   --  22  --  25  --   GLUCOSE 179*  --   --   --  249*  --  329*  --   --   --   --   --  215*  --  136*  --   BUN 91*  --   --   --  90*  --  89*  --   --   --   --   --  96*  --  113*  --   CREATININE 4.20*  --   --   --  3.66*  --  3.44*  --   --   --   --   --  3.45*  --  3.50*  --   CALCIUM 7.3*  --   --   --  7.9*  --  8.0*  --   --   --   --   --  8.3*  --  8.9  --   MG 2.8*  --  2.7* 2.7* 2.7*  --  2.8*  --   --   --   --   --   --   --   --   --   PHOS 8.6*  --  8.3* 7.9* 7.2*  --  6.4*  --   --   --   --   --   --   --   --   --    < > = values in this interval not displayed.   Estimated Creatinine Clearance: 14.2 mL/min (A) (by C-G formula based on SCr of 3.5 mg/dL (H)).   LIVER Recent Labs  Lab 05/25/20 0444 05/26/20 0439  05/27/20 0557 05/27/20 2024  AST 35 178* 72*  --   ALT 14 52* 47*  --   ALKPHOS 38 71 67  --   BILITOT 0.3 0.5 0.7  --   PROT 6.1* 6.1* 5.7*  --   ALBUMIN 2.2* 2.2* 2.6*  --   INR  --   --   --  1.5*     INFECTIOUS Recent Labs  Lab 05/25/20 0444 05/27/20 1716 05/27/20 2024 05/28/20 0155 05/29/20 0348  LATICACIDVEN  --   --  3.0* 1.6  --   PROCALCITON 0.13 0.58  --   --  0.88     ENDOCRINE CBG (last 3)  Recent Labs    05/31/20 0340 05/31/20 0749 05/31/20 1135  GLUCAP 138* 85 92         IMAGING x48h  - image(s) personally visualized  -   highlighted in bold DG CHEST PORT 1 VIEW  Result Date: 05/31/2020 CLINICAL DATA:  COVID-19 pneumonia.  Endotracheal tube present. EXAM: PORTABLE CHEST 1 VIEW COMPARISON:  05/30/2020; 05/29/2020; 05/27/2020 FINDINGS: Grossly unchanged cardiac silhouette and mediastinal contours. Slight retraction of left subclavian vein approach central venous catheter with tip overlying the superior SVC and midportion herniated into the base of the left internal jugular vein. Otherwise, stable position of support apparatus. No pneumothorax. Minimally improved aeration of lung bases with persistent bilateral interstitial opacities. No new focal airspace opacities. No acute osseous abnormalities. Old right mid clavicular fracture. Post cholecystectomy. IMPRESSION: 1. Slight retraction of left subclavian vein approach central venous catheter with tip overlying the superior SVC and midportion herniated into the base of the left internal jugular vein. Otherwise, stable position of support apparatus. No pneumothorax. 2. Minimally improved aeration of the lungs with otherwise similar findings suggestive of COVID 19 infection. Electronically Signed   By: Sandi Mariscal M.D.   On: 05/31/2020 12:37   DG CHEST PORT 1 VIEW  Result Date: 05/30/2020 CLINICAL DATA:  69 year old female with ARDS. Positive COVID-19. EXAM: PORTABLE CHEST 1 VIEW COMPARISON:  Chest radiograph  dated 05/29/2020. FINDINGS: Endotracheal tube above the carina and left IJ central venous line with tip in similar position. Enteric tube extends below the diaphragm with tip beyond the inferior margin of the image. Faint bilateral confluent pulmonary densities again noted. No large pleural effusion or pneumothorax. Stable cardiac silhouette. Atherosclerotic calcification of the aorta. No acute osseous pathology. IMPRESSION: No significant interval change. Electronically Signed   By: Anner Crete M.D.   On: 05/30/2020 18:37

## 2020-05-31 NOTE — Progress Notes (Signed)
[facility] Progress Note   Subjective: Req'd ^ MV last night - ABG improved.  BUN up in the 110s, Cr 3.5 (stable) I/Os yesterday:  2.2 / 2.4 all UOP  Objective Vitals:   05/31/20 1544 05/31/20 1556 05/31/20 1600 05/31/20 1640  BP: (!) 110/53 (!) 111/54 103/85   Pulse: (!) 102 91 84   Resp: (!) 32 (!) 40 (!) 30   Temp:    98.8 F (37.1 C)  TempSrc:    Oral  SpO2: 93% 91% 92%   Weight:      Height:       Physical Exam Deferred, reviewed detailed notes  Additional Objective Labs: Basic Metabolic Panel: Recent Labs  Lab 05/28/20 0512 05/28/20 1812 05/29/20 0348 05/29/20 0430 05/29/20 1644 05/29/20 1820 05/30/20 0455 05/31/20 0350 05/31/20 0536  NA   < >  --  144   < > 145   < > 146* 151* 149*  K   < >  --  4.4   < > 4.9   < > 4.7 4.7 4.6  CL   < >  --  112*   < > 113*  --  113* 114*  --   CO2   < >  --  18*   < > 20*  --  22 25  --   GLUCOSE   < >  --  249*   < > 329*  --  215* 136*  --   BUN   < >  --  90*   < > 89*  --  96* 113*  --   CREATININE   < >  --  3.66*   < > 3.44*  --  3.45* 3.50*  --   CALCIUM   < >  --  7.9*   < > 8.0*  --  8.3* 8.9  --   PHOS  --  7.9* 7.2*  --  6.4*  --   --   --   --    < > = values in this interval not displayed.   Liver Function Tests: Recent Labs  Lab 05/25/20 0444 05/26/20 0439 05/27/20 0557  AST 35 178* 72*  ALT 14 52* 47*  ALKPHOS 38 71 67  BILITOT 0.3 0.5 0.7  PROT 6.1* 6.1* 5.7*  ALBUMIN 2.2* 2.2* 2.6*   No results for input(s): LIPASE, AMYLASE in the last 168 hours. CBC: Recent Labs  Lab 05/25/20 0444 05/25/20 0444 05/26/20 0439 05/26/20 0439 05/27/20 0557 05/27/20 1716 05/28/20 1812 05/28/20 1812 05/29/20 0348 05/29/20 0430 05/30/20 0455 05/30/20 0455 05/31/20 0350 05/31/20 0536 05/31/20 1331  WBC 10.9*   < > 11.9*   < > 15.5*   < > 18.8*   < > 17.7*   < > 10.9*  --  11.4*  --  13.7*  NEUTROABS 9.3*  --  10.1*  --  12.8*  --   --   --   --   --   --   --   --   --   --   HGB 9.9*    < > 10.3*   < > 8.2*   < > 8.9*   < > 8.3*   < > 7.4*   < > 7.8* 6.5* 7.4*  HCT 29.4*   < > 30.8*   < > 24.5*   < > 26.3*   < > 24.9*   < > 22.5*   < > 23.9* 19.0* 22.5*  MCV 92.7   < >  93.9   < > 95.0   < > 89.2  --  90.5  --  93.0  --  95.2  --  94.1  PLT 322   < > 320   < > 319   < > 246   < > 232   < > 152  --  147*  --  145*   < > = values in this interval not displayed.   Blood Culture    Component Value Date/Time   SDES TRACHEAL ASPIRATE 05/28/2020 0912   SPECREQUEST Normal 05/28/2020 0912   CULT RARE CANDIDA ALBICANS 05/28/2020 0912   REPTSTATUS 05/30/2020 FINAL 05/28/2020 0912    Cardiac Enzymes: No results for input(s): CKTOTAL, CKMB, CKMBINDEX, TROPONINI in the last 168 hours. CBG: Recent Labs  Lab 05/30/20 2335 05/31/20 0340 05/31/20 0749 05/31/20 1135 05/31/20 1639  GLUCAP 181* 138* 85 92 158*   Iron Studies:  No results for input(s): IRON, TIBC, TRANSFERRIN, FERRITIN in the last 72 hours. '@[email or web address]' @ Studies/Results: DG CHEST PORT 1 VIEW  Result Date: 05/31/2020 CLINICAL DATA:  COVID-19 pneumonia.  Endotracheal tube present. EXAM: PORTABLE CHEST 1 VIEW COMPARISON:  05/30/2020; 05/29/2020; 05/27/2020 FINDINGS: Grossly unchanged cardiac silhouette and mediastinal contours. Slight retraction of left subclavian vein approach central venous catheter with tip overlying the superior SVC and midportion herniated into the base of the left internal jugular vein. Otherwise, stable position of support apparatus. No pneumothorax. Minimally improved aeration of lung bases with persistent bilateral interstitial opacities. No new focal airspace opacities. No acute osseous abnormalities. Old right mid clavicular fracture. Post cholecystectomy. IMPRESSION: 1. Slight retraction of left subclavian vein approach central venous catheter with tip overlying the superior SVC and midportion herniated into the base of the left internal jugular vein. Otherwise, stable position of support  apparatus. No pneumothorax. 2. Minimally improved aeration of the lungs with otherwise similar findings suggestive of COVID 19 infection. Electronically Signed   By: Sandi Mariscal M.D.   On: 05/31/2020 12:37   DG CHEST PORT 1 VIEW  Result Date: 05/30/2020 CLINICAL DATA:  69 year old female with ARDS. Positive COVID-19. EXAM: PORTABLE CHEST 1 VIEW COMPARISON:  Chest radiograph dated 05/29/2020. FINDINGS: Endotracheal tube above the carina and left IJ central venous line with tip in similar position. Enteric tube extends below the diaphragm with tip beyond the inferior margin of the image. Faint bilateral confluent pulmonary densities again noted. No large pleural effusion or pneumothorax. Stable cardiac silhouette. Atherosclerotic calcification of the aorta. No acute osseous pathology. IMPRESSION: No significant interval change. Electronically Signed   By: Anner Crete M.D.   On: 05/30/2020 18:37   Medications: . sodium chloride    . feeding supplement (VITAL AF 1.2 CAL) 1,000 mL (05/29/20 1634)  . fentaNYL infusion INTRAVENOUS 250 mcg/hr (05/31/20 1600)  . midazolam 6 mg/hr (05/31/20 1600)  . norepinephrine (LEVOPHED) Adult infusion 2 mcg/min (05/31/20 1600)   . sodium chloride   Intravenous Once  . artificial tears   Both Eyes Q8H  . chlorhexidine gluconate (MEDLINE KIT)  15 mL Mouth Rinse BID  . Chlorhexidine Gluconate Cloth  6 each Topical Daily  . docusate  100 mg Per Tube BID  . feeding supplement (PROSource TF)  45 mL Per Tube TID  . free water  200 mL Per Tube Q8H  . insulin aspart  0-20 Units Subcutaneous Q4H  . insulin aspart  8 Units Subcutaneous Q4H  . insulin detemir  30 Units Subcutaneous BID  . levothyroxine  175 mcg Per Tube Q0600  .  linagliptin  5 mg Per Tube Daily  . mouth rinse  15 mL Mouth Rinse 10 times per day  . methylPREDNISolone (SOLU-MEDROL) injection  40 mg Intravenous Q12H  . pantoprazole sodium  40 mg Per Tube Daily  . sodium bicarbonate  1,300 mg Per Tube  BID  . sodium chloride flush  10-40 mL Intracatheter Q12H   Assessment/Plan **hypoxic respiratory failure: secondary to Coleman.  Vent and therapies per PCCM.   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  Presume she has ATN on top of baseline CKD.  UP/C < 2 --> not consistent with florid nephrosis/flare of membranous.  Started on prograf several months ago for persistant proteinuria but in setting of acute issues and shock have d/c'd due to vasoconstrictive effect (Also holding RAAS inhibition at this time).   Renal function stable currently with Cr 3.5 but noted BUN trending up - hold diuretics today.  No indications for dialysis.  Should need arise family is ok with it but have asked to be notified prior to initiation.   **Hyperkalemia: normalized after 2 doses lokelma, follow  **Mixed acidosis: secondary to AKI, enteral bicarb ordered, vent changes per primary.   **ABLA: rectus hematoma, Hb  Improved s/p transfusion and appears stable in the 7s now, transfusion and reimaging per primary.    Will follow  Jannifer Hick MD 05/31/2020, 5:10 PM  [facility] Pager: [phone]

## 2020-05-31 NOTE — Progress Notes (Signed)
Pt's head turned and arms rotated with no complications. ETT secured

## 2020-06-01 ENCOUNTER — Inpatient Hospital Stay (HOSPITAL_COMMUNITY): Payer: Medicare HMO

## 2020-06-01 DIAGNOSIS — J9601 Acute respiratory failure with hypoxia: Secondary | ICD-10-CM | POA: Diagnosis not present

## 2020-06-01 DIAGNOSIS — U071 COVID-19: Secondary | ICD-10-CM | POA: Diagnosis not present

## 2020-06-01 LAB — POCT I-STAT 7, (LYTES, BLD GAS, ICA,H+H)
Acid-Base Excess: 2 mmol/L (ref 0.0–2.0)
Acid-Base Excess: 1 mmol/L (ref 0.0–2.0)
Acid-Base Excess: 2 mmol/L (ref 0.0–2.0)
Bicarbonate: 27.8 mmol/L (ref 20.0–28.0)
Bicarbonate: 27.2 mmol/L (ref 20.0–28.0)
Bicarbonate: 27.7 mmol/L (ref 20.0–28.0)
Calcium, Ion: 1.32 mmol/L (ref 1.15–1.40)
Calcium, Ion: 1.35 mmol/L (ref 1.15–1.40)
Calcium, Ion: 1.34 mmol/L (ref 1.15–1.40)
HCT: 18 % — ABNORMAL LOW (ref 36.0–46.0)
HCT: 19 % — ABNORMAL LOW (ref 36.0–46.0)
HCT: 25 % — ABNORMAL LOW (ref 36.0–46.0)
Hemoglobin: 6.1 g/dL — CL (ref 12.0–15.0)
Hemoglobin: 6.5 g/dL — CL (ref 12.0–15.0)
Hemoglobin: 8.5 g/dL — ABNORMAL LOW (ref 12.0–15.0)
O2 Saturation: 84 %
O2 Saturation: 98 %
O2 Saturation: 96 %
Patient temperature: 36.8
Patient temperature: 36.4
Patient temperature: 36.4
Potassium: 4.9 mmol/L (ref 3.5–5.1)
Potassium: 4.9 mmol/L (ref 3.5–5.1)
Potassium: 4.9 mmol/L (ref 3.5–5.1)
Sodium: 150 mmol/L — ABNORMAL HIGH (ref 135–145)
Sodium: 150 mmol/L — ABNORMAL HIGH (ref 135–145)
Sodium: 153 mmol/L — ABNORMAL HIGH (ref 135–145)
TCO2: 29 mmol/L (ref 22–32)
TCO2: 29 mmol/L (ref 22–32)
TCO2: 29 mmol/L (ref 22–32)
pCO2 arterial: 49.9 mmHg — ABNORMAL HIGH (ref 32.0–48.0)
pCO2 arterial: 53.8 mmHg — ABNORMAL HIGH (ref 32.0–48.0)
pCO2 arterial: 46.5 mmHg (ref 32.0–48.0)
pH, Arterial: 7.353 (ref 7.350–7.450)
pH, Arterial: 7.309 — ABNORMAL LOW (ref 7.350–7.450)
pH, Arterial: 7.381 (ref 7.350–7.450)
pO2, Arterial: 52 mmHg — ABNORMAL LOW (ref 83.0–108.0)
pO2, Arterial: 124 mmHg — ABNORMAL HIGH (ref 83.0–108.0)
pO2, Arterial: 81 mmHg — ABNORMAL LOW (ref 83.0–108.0)

## 2020-06-01 LAB — URINALYSIS, ROUTINE W REFLEX MICROSCOPIC
Bilirubin Urine: NEGATIVE
Glucose, UA: 50 mg/dL — AB
Hgb urine dipstick: NEGATIVE
Ketones, ur: NEGATIVE mg/dL
Nitrite: NEGATIVE
Protein, ur: 30 mg/dL — AB
Specific Gravity, Urine: 1.014 (ref 1.005–1.030)
pH: 5 (ref 5.0–8.0)

## 2020-06-01 LAB — CBC
HCT: 21.6 % — ABNORMAL LOW (ref 36.0–46.0)
HCT: 23.2 % — ABNORMAL LOW (ref 36.0–46.0)
Hemoglobin: 7 g/dL — ABNORMAL LOW (ref 12.0–15.0)
Hemoglobin: 7.3 g/dL — ABNORMAL LOW (ref 12.0–15.0)
MCH: 31.1 pg (ref 26.0–34.0)
MCH: 30.7 pg (ref 26.0–34.0)
MCHC: 32.4 g/dL (ref 30.0–36.0)
MCHC: 31.5 g/dL (ref 30.0–36.0)
MCV: 96 fL (ref 80.0–100.0)
MCV: 97.5 fL (ref 80.0–100.0)
Platelets: 130 10*3/uL — ABNORMAL LOW (ref 150–400)
Platelets: 151 10*3/uL (ref 150–400)
RBC: 2.25 MIL/uL — ABNORMAL LOW (ref 3.87–5.11)
RBC: 2.38 MIL/uL — ABNORMAL LOW (ref 3.87–5.11)
RDW: 15 % (ref 11.5–15.5)
RDW: 15.1 % (ref 11.5–15.5)
WBC: 10.3 10*3/uL (ref 4.0–10.5)
WBC: 13.3 10*3/uL — ABNORMAL HIGH (ref 4.0–10.5)
nRBC: 0 % (ref 0.0–0.2)
nRBC: 0 % (ref 0.0–0.2)

## 2020-06-01 LAB — COMPREHENSIVE METABOLIC PANEL
ALT: 17 U/L (ref 0–44)
AST: 13 U/L — ABNORMAL LOW (ref 15–41)
Albumin: 1.5 g/dL — ABNORMAL LOW (ref 3.5–5.0)
Alkaline Phosphatase: 59 U/L (ref 38–126)
Anion gap: 11 (ref 5–15)
BUN: 118 mg/dL — ABNORMAL HIGH (ref 8–23)
CO2: 26 mmol/L (ref 22–32)
Calcium: 8.9 mg/dL (ref 8.9–10.3)
Chloride: 115 mmol/L — ABNORMAL HIGH (ref 98–111)
Creatinine, Ser: 3.27 mg/dL — ABNORMAL HIGH (ref 0.44–1.00)
GFR, Estimated: 14 mL/min — ABNORMAL LOW (ref >60)
Glucose, Bld: 175 mg/dL — ABNORMAL HIGH (ref 70–99)
Potassium: 5 mmol/L (ref 3.5–5.1)
Sodium: 152 mmol/L — ABNORMAL HIGH (ref 135–145)
Total Bilirubin: 0.2 mg/dL — ABNORMAL LOW (ref 0.3–1.2)
Total Protein: 4.8 g/dL — ABNORMAL LOW (ref 6.5–8.1)

## 2020-06-01 LAB — CULTURE, BLOOD (ROUTINE X 2)
Culture: NO GROWTH
Culture: NO GROWTH

## 2020-06-01 LAB — GLUCOSE, CAPILLARY
Glucose-Capillary: 159 mg/dL — ABNORMAL HIGH (ref 70–99)
Glucose-Capillary: 181 mg/dL — ABNORMAL HIGH (ref 70–99)
Glucose-Capillary: 143 mg/dL — ABNORMAL HIGH (ref 70–99)
Glucose-Capillary: 114 mg/dL — ABNORMAL HIGH (ref 70–99)
Glucose-Capillary: 41 mg/dL — CL (ref 70–99)
Glucose-Capillary: 118 mg/dL — ABNORMAL HIGH (ref 70–99)
Glucose-Capillary: 54 mg/dL — ABNORMAL LOW (ref 70–99)

## 2020-06-01 LAB — MAGNESIUM: Magnesium: 2.5 mg/dL — ABNORMAL HIGH (ref 1.7–2.4)

## 2020-06-01 LAB — PROCALCITONIN: Procalcitonin: 0.21 ng/mL

## 2020-06-01 LAB — LACTIC ACID, PLASMA: Lactic Acid, Venous: 1.1 mmol/L (ref 0.5–1.9)

## 2020-06-01 LAB — PHOSPHORUS: Phosphorus: 4.9 mg/dL — ABNORMAL HIGH (ref 2.5–4.6)

## 2020-06-01 LAB — PREPARE RBC (CROSSMATCH)

## 2020-06-01 MED ORDER — METHYLPREDNISOLONE SODIUM SUCC 40 MG IJ SOLR
20.0000 mg | Freq: Two times a day (BID) | INTRAMUSCULAR | Status: AC
  Administered 2020-06-02 – 2020-06-03 (×4): 20 mg via INTRAVENOUS
  Filled 2020-06-01 (×4): qty 1

## 2020-06-01 MED ORDER — METHYLPREDNISOLONE SODIUM SUCC 40 MG IJ SOLR
20.0000 mg | Freq: Every day | INTRAMUSCULAR | Status: AC
  Administered 2020-06-04 – 2020-06-05 (×2): 20 mg via INTRAVENOUS
  Filled 2020-06-01 (×2): qty 1

## 2020-06-01 MED ORDER — LACTATED RINGERS IV BOLUS
500.0000 mL | Freq: Once | INTRAVENOUS | Status: AC
  Administered 2020-06-01: 500 mL via INTRAVENOUS

## 2020-06-01 MED ORDER — SODIUM CHLORIDE 0.9% IV SOLUTION: Freq: Once | INTRAVENOUS | Status: DC

## 2020-06-01 MED ORDER — FREE WATER
200.0000 mL | Freq: Four times a day (QID) | Status: DC
  Administered 2020-06-01 – 2020-06-02 (×4): 200 mL

## 2020-06-01 MED ORDER — SODIUM CHLORIDE 0.9 % IV SOLN
2.0000 g | INTRAVENOUS | Status: DC
  Administered 2020-06-01 – 2020-06-02 (×2): 2 g via INTRAVENOUS
  Filled 2020-06-01 (×2): qty 2

## 2020-06-01 MED ORDER — DEXTROSE 50 % IV SOLN
INTRAVENOUS | Status: AC
  Administered 2020-06-01: 50 mL
  Filled 2020-06-01: qty 50

## 2020-06-01 NOTE — Progress Notes (Signed)
[facility] Progress Note Patient Name: [name] DOB: 11/06/1950 MRN: [id number]   Date of Service  06/01/2020  HPI/Events of Note  Patient meets criteria for proning based on her latest ABG, however hemoglobin on that gas was 6.1 gm / dl.  eICU Interventions  Stat CBC to check hemoglobin, if repeat hemoglobin does not confirm acute blood loss anemia, will prone patient. Transfuse 1 unit PRBC.        Kerry Kass Brantley Wiley 06/01/2020, 3:11 AM

## 2020-06-01 NOTE — Progress Notes (Signed)
[facility] Progress Note   Subjective: Req'd 1u pRBC.    BUN up in the 110s, Cr 3.27, down form 3.5 I/Os yesterday:  2.0 / 2.2 all UOP  Objective Vitals:   06/01/20 0817 06/01/20 0900 06/01/20 1000 06/01/20 1100  BP: (!) 116/57 (!) 123/57 (!) 131/56 (!) 124/51  Pulse: (!) 114 (!) 120 (!) 125 (!) 128  Resp: (!) 30 (!) 28 (!) 30 (!) 32  Temp:  100 F (37.8 C) (!) 101.7 F (38.7 C) (!) 102.9 F (39.4 C)  TempSrc:      SpO2: 95% 95% 95% 94%  Weight:      Height:       Physical Exam Gen:  Intubated, sedated, proned CV: tachycardic Lungs: coarse, noted vent settings Extr: no edema GU: foley with clear yellow urine  Additional Objective Labs: Basic Metabolic Panel: Recent Labs  Lab 05/29/20 0348 05/29/20 0430 05/29/20 1644 05/29/20 1820 05/30/20 0455 05/30/20 0455 05/31/20 0350 05/31/20 0536 06/01/20 0221 06/01/20 0243 06/01/20 0558  NA 144   < > 145   < > 146*   < > 151*   < > 150* 152* 150*  K 4.4   < > 4.9   < > 4.7   < > 4.7   < > 4.9 5.0 4.9  CL 112*   < > 113*   < > 113*  --  114*  --   --  115*  --   CO2 18*   < > 20*   < > 22  --  25  --   --  26  --   GLUCOSE 249*   < > 329*   < > 215*  --  136*  --   --  175*  --   BUN 90*   < > 89*   < > 96*  --  113*  --   --  118*  --   CREATININE 3.66*   < > 3.44*   < > 3.45*  --  3.50*  --   --  3.27*  --   CALCIUM 7.9*   < > 8.0*   < > 8.3*  --  8.9  --   --  8.9  --   PHOS 7.2*  --  6.4*  --   --   --   --   --   --  4.9*  --    < > = values in this interval not displayed.   Liver Function Tests: Recent Labs  Lab 05/26/20 0439 05/27/20 0557 06/01/20 0243  AST 178* 72* 13*  ALT 52* 47* 17  ALKPHOS 71 67 59  BILITOT 0.5 0.7 0.2*  PROT 6.1* 5.7* 4.8*  ALBUMIN 2.2* 2.6* 1.5*   No results for input(s): LIPASE, AMYLASE in the last 168 hours. CBC: Recent Labs  Lab 05/26/20 0439 05/26/20 0439 05/27/20 0557 05/27/20 1716 05/30/20 0455 05/30/20 0455 05/31/20 0350 05/31/20 0536  05/31/20 1331 06/01/20 0221 06/01/20 0243 06/01/20 0558 06/01/20 1138  WBC 11.9*   < > 15.5*   < > 10.9*   < > 11.4*   < > 13.7*  --  10.3  --  13.3*  NEUTROABS 10.1*  --  12.8*  --   --   --   --   --   --   --   --   --   --   HGB 10.3*   < > 8.2*   < > 7.4*   < >  7.8*   < > 7.4*   < > 7.0* 6.5* 7.3*  HCT 30.8*   < > 24.5*   < > 22.5*   < > 23.9*   < > 22.5*   < > 21.6* 19.0* 23.2*  MCV 93.9   < > 95.0   < > 93.0  --  95.2  --  94.1  --  96.0  --  97.5  PLT 320   < > 319   < > 152   < > 147*   < > 145*  --  130*  --  151   < > = values in this interval not displayed.   Blood Culture    Component Value Date/Time   SDES TRACHEAL ASPIRATE 05/28/2020 0912   SPECREQUEST Normal 05/28/2020 0912   CULT RARE CANDIDA ALBICANS 05/28/2020 0912   REPTSTATUS 05/30/2020 FINAL 05/28/2020 0912    Cardiac Enzymes: No results for input(s): CKTOTAL, CKMB, CKMBINDEX, TROPONINI in the last 168 hours. CBG: Recent Labs  Lab 05/31/20 1919 05/31/20 2333 06/01/20 0316 06/01/20 0805 06/01/20 1234  GLUCAP 175* 124* 159* 181* 143*   Iron Studies:  No results for input(s): IRON, TIBC, TRANSFERRIN, FERRITIN in the last 72 hours. '@[email or web address]' @ Studies/Results: DG Chest Port 1 View  Result Date: 06/01/2020 CLINICAL DATA:  Reason for exam: Endotracheal tube present in COVID + pt. Pt is PRONE EXAM: PORTABLE CHEST - 1 VIEW COMPARISON:  05/31/2020 FINDINGS: Endotracheal tube, gastric tube, left IJ central venous catheter stable in position. Some increase in coarse interstitial opacities throughout both lung fields with patchy airspace opacities in the lung bases and left mid lung slightly increased. Heart size and mediastinal contours are within normal limits. Aortic Atherosclerosis (ICD10-170.0). No effusion.  No pneumothorax. Visualized bones unremarkable. IMPRESSION: Worsening asymmetric infiltrates or edema as above. Electronically Signed   By: Lucrezia Europe M.D.   On: 06/01/2020 11:20   DG CHEST PORT 1  VIEW  Result Date: 05/31/2020 CLINICAL DATA:  COVID-19 pneumonia.  Endotracheal tube present. EXAM: PORTABLE CHEST 1 VIEW COMPARISON:  05/30/2020; 05/29/2020; 05/27/2020 FINDINGS: Grossly unchanged cardiac silhouette and mediastinal contours. Slight retraction of left subclavian vein approach central venous catheter with tip overlying the superior SVC and midportion herniated into the base of the left internal jugular vein. Otherwise, stable position of support apparatus. No pneumothorax. Minimally improved aeration of lung bases with persistent bilateral interstitial opacities. No new focal airspace opacities. No acute osseous abnormalities. Old right mid clavicular fracture. Post cholecystectomy. IMPRESSION: 1. Slight retraction of left subclavian vein approach central venous catheter with tip overlying the superior SVC and midportion herniated into the base of the left internal jugular vein. Otherwise, stable position of support apparatus. No pneumothorax. 2. Minimally improved aeration of the lungs with otherwise similar findings suggestive of COVID 19 infection. Electronically Signed   By: Sandi Mariscal M.D.   On: 05/31/2020 12:37   DG CHEST PORT 1 VIEW  Result Date: 05/30/2020 CLINICAL DATA:  69 year old female with ARDS. Positive COVID-19. EXAM: PORTABLE CHEST 1 VIEW COMPARISON:  Chest radiograph dated 05/29/2020. FINDINGS: Endotracheal tube above the carina and left IJ central venous line with tip in similar position. Enteric tube extends below the diaphragm with tip beyond the inferior margin of the image. Faint bilateral confluent pulmonary densities again noted. No large pleural effusion or pneumothorax. Stable cardiac silhouette. Atherosclerotic calcification of the aorta. No acute osseous pathology. IMPRESSION: No significant interval change. Electronically Signed   By: Anner Crete M.D.   On:  05/30/2020 18:37   Medications: . sodium chloride    . ceFEPime (MAXIPIME) IV 2 g (06/01/20 1206)   . feeding supplement (VITAL AF 1.2 CAL) 1,000 mL (06/01/20 0740)  . fentaNYL infusion INTRAVENOUS 400 mcg/hr (06/01/20 1100)  . midazolam 10 mg/hr (06/01/20 1100)   . sodium chloride   Intravenous Once  . sodium chloride   Intravenous Once  . artificial tears   Both Eyes Q8H  . chlorhexidine gluconate (MEDLINE KIT)  15 mL Mouth Rinse BID  . Chlorhexidine Gluconate Cloth  6 each Topical Daily  . docusate  100 mg Per Tube BID  . feeding supplement (PROSource TF)  45 mL Per Tube TID  . free water  200 mL Per Tube Q6H  . insulin aspart  0-20 Units Subcutaneous Q4H  . insulin aspart  8 Units Subcutaneous Q4H  . insulin detemir  30 Units Subcutaneous BID  . levothyroxine  175 mcg Per Tube Q0600  . linagliptin  5 mg Per Tube Daily  . mouth rinse  15 mL Mouth Rinse 10 times per day  . [START ON 06/02/2020] methylPREDNISolone (SOLU-MEDROL) injection  20 mg Intravenous Q12H   Followed by  . [START ON 06/04/2020] methylPREDNISolone (SOLU-MEDROL) injection  20 mg Intravenous Daily  . methylPREDNISolone (SOLU-MEDROL) injection  40 mg Intravenous Q12H  . pantoprazole sodium  40 mg Per Tube Daily  . sodium bicarbonate  1,300 mg Per Tube BID  . sodium chloride flush  10-40 mL Intracatheter Q12H   Assessment/Plan **hypoxic respiratory failure: secondary to [place].  Vent and therapies per PCCM.   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  Presume she has ATN on top of baseline CKD.  UP/C < 2 --> not consistent with florid nephrosis/flare of membranous.  Started on prograf several months ago for persistant proteinuria but in setting of acute issues and shock have d/c'd due to vasoconstrictive effect (Also holding RAAS inhibition at this time).   Renal function stable to slightly improved currently with Cr 3.3 but noted BUN trending up - holding diuretics, possible BUN ^ with GI blood loss  reported.  No indications for dialysis.  Should need arise family is ok with it but have asked to be notified prior to initiation.   **Hyperkalemia: normalized after 2 doses lokelma, follow  **Hypernatremia: ^ FWF frequency today.  **Mixed acidosis: secondary to AKI, enteral bicarb ordered, vent changes per primary.   **ABLA: rectus hematoma, some report of GI bleeding per RN.  Transfusions per primary.   Will follow  Jannifer Hick MD 06/01/2020, 12:39 PM  [facility] Pager: [phone]

## 2020-06-01 NOTE — Progress Notes (Signed)
Pt supine at this time by 4RNs and 1 RT with no complications.  Cloth tape and foam pads removed form face.  No breakdown noted on cheeks, lips or inside mouth.  ETT secured at 21 at th lips with commercial tube holder.  Pt tolerated well.

## 2020-06-01 NOTE — Progress Notes (Signed)
Patient's HGB increased from 6.5 to 7.3  RN discussed these results with Dr. Chase Caller and he instructed to hold the unit of blood that was previously prescribed. Will continue to monitor for signs and symptoms of bleeding

## 2020-06-01 NOTE — Progress Notes (Signed)
NAME:  [name], MRN:  [id number], DOB:  1950-12-06, LOS: 56 ADMISSION DATE:  05/21/2020, CONSULTATION DATE:  10/5 REFERRING MD: Memon/ Triad, CHIEF COMPLAINT:  resp distress    Brief History     69 yowf , with history of T2DM, OSA, Hothyroidism, HTN, HLD, hepatitis, and GERD presents to the ED with a chief complaint of dyspnea.  Patient reports that symptom onset was 5 weeks ago, and started with diverticulitis.  Patient reports that since then she has had dyspnea and cough that started simultaneously.  The dyspnea and cough became worse over the last 2 weeks.  Patient reports that dyspnea is worse with exertion, and she can even walk now.  She reports that she fell on 9/29, which she attributes to being so weak.  She describes the fall as she was sitting in her chair got up took 2 steps and ended up on the ground.  She does not remember feeling lightheaded, or any preceding symptoms, and she did not hit her head.  Patient reports that she is weak because she has had decreased appetite and decreased p.o. intake since the diverticulitis 5 weeks pta.  Patient reports that her taste and smell are intact.  She does not have chest pain or palpitations.  She reports that her cough is a dry cough.  She had fevers for the first 3 weeks of her symptoms, but not for the last 2 weeks.  She reports that her T-max was 103.1, and Tylenol helps when she remembers to take it.  Patient has not had body aches, nausea vomiting, diarrhea, dysuria.  She does admit to shakiness, and vision changes over the last 2 weeks.  She reports that she cannot focus her eyes, so is blurry vision, but not double vision.  She does not have a sore throat and is not has clogged sinuses.  Patient did not get a Covid vaccine, because "I did not want 1."  Patient is refusing remdesivir.  Past Medical History  DM  II OSA Hypothyroid HBP HLD GERD     Significant Hospital Events   04/27/2020 - admit 05/30/20 - Sedated and prone . No  acute issues overnight received on dose of NMB last night and again this AM  10/19 -  05/31/2020: On fentanyl infusion, Versed infusion.  Off Levophed infusion and then back on.  On tube feeds.  On the ventilator FiO2 60%. Peep 12 -> pulse ox 95%. Afebrile.  Supine since 4am. PF ratio 201   Consults:  PCCM  10/5  Procedures:  Oral ET  10/5  L IJ CVL  10/5   Significant Diagnostic Tests:  CT Abd L large rectus sheath hematoma, COVID PNA on visible lung fields  Micro Data:  Covid 19  PCR  9/30  POS MRSA PCR  10/1 >   Neg   Blood 05/27/2020: Negative Tracheal aspirate 05/28/2020: Rare Candida blood culture xxxx Blood 06/01/2020 Urine 06/01/2020 Tracheal aspirate 06/01/2020 Procalcitonin 06/01/2020   ID Rx:  Baricitinib 10/3 > 10/5 (stopped following abd pain and rise in LFT) Remdesivir [refused] Solu-Medrol 10/1  > (stop in 14 days total)  Interim history/subjective:   06/01/2020 - deterioration in PF ratio this morning while supine and went back on prone. Currently 68% fio2,  And fent gtt, versed gt. Off levophed.. Chest x-ray this morning shows the device is in location.  She has new fever 101.7 but white count is going down. Concern for hemoglobin less than 10 g%.  No active bleeding.  Bun worse/creat better  PRBC ordered  Objective   Blood pressure (!) 116/57, pulse (!) 114, temperature 98.6 F (37 C), resp. rate (!) 30, height 5\' 1"  (1.549 m), weight 75 kg, SpO2 95 %.    Vent Mode: PRVC FiO2 (%):  [60 %-70 %] 70 % Set Rate:  [30 bmp] 30 bmp Vt Set:  [350 mL] 350 mL PEEP:  [12 cmH20] 12 cmH20 Plateau Pressure:  [24 cmH20-25 cmH20] 25 cmH20   Intake/Output Summary (Last 24 hours) at 06/01/2020 0902 Last data filed at 06/01/2020 0800 Gross per 24 hour  Intake 1835.16 ml  Output 2000 ml  Net -164.84 ml   Filed Weights   05/29/20 0405 05/31/20 0500 06/01/20 0500  Weight: 73.8 kg 74.1 kg 75 kg   Physical exam  Deeply sedated female with fentanyl and Versed  RASS sedation score -4.  She is prone.  Visually the skin appears intact.  She is synchronous with the ventilator at FiO2 70% and a PEEP of 12.  She has faint basal crackles.  She has a kyphotic chest.  Back looks fine.  Calves looks normal and the skin looks intact.  SCD not oberseved on exam   Resolved Hospital Problem list    Hyperkalemia- resolved  Assessment & Plan:   Acute hypoxic/hypercapnic respiratory failure and sepsis (POA) due to severe ARDS from COVID19 pneumonia  06/01/2020 - > Back on prone.  does not meet criteria for SBT/Extubation in setting of Acute Respiratory Failure due to ARDS with severe low PF ration and ongoing prone ventilation   P: ARDS Measure  - Target TVol 6-8cc/kgIBW  - Target Plateau Pressure < 30cm H20  - Target driving pressure less than 15 cm of water  - Target PaO2 55-65: titrate PEEP/FiO2 per protocol  - As long as PaO2 to FiO2 ratio is less than 1:150 position in prone position for 16 hours a day   COVID -19 - unvaccinated. Refused remdesivir  Plan Continue Baricitinib and methylprednisolone, family dose not wan't Remdesivir    Concern for sepsis 06/01/2020  06/01/2020  - Febrile, Tachy (SIRS +) - worsening hypoxemx. Concern for VAP  Plan   - pan culture  - check sepsis biomarkets - emprici cefepime after culture  Acute on chronic renal failure - - thought to be ATN on baseline CKD  06/01/2020 - BUN 118, creat 3.3 and Na still high. Worsening pre- renal  > ? Bleed related   P: Eval for bleeding -see anemia section Increase free water from 229mL Q8h -> q6h Cointiue bic tabls Nephrology following  Tacro remains on hold Follow renal function / urine output Trend Bmet Avoid nephrotoxins, ensure adequate renal perfusion  Family is agreeable for HD if needed   ABLA secondary to Rectus sheath hematoma seen on CT 10/6 - spontaneous, Hgb 5>>10 after 2 unit pRBC, now stable at 8 g/dL . Was on prophylactic subq heparin prescribed at  time of bleed   06/01/2020 - I Hgb < 7g% . No visible bleed. PRBC ordered x 1 by eMD  P: -recheck cbc stat with type and screen -> then  PRBC 06/01/20 - hold of heparin dvt proph - PRBC for hgb </= 6.9gm%    - exceptions are   -  if ACS susepcted/confirmed then transfuse for hgb </= 8.0gm%,  or    -  active bleeding with hemodynamic instability, then transfuse regardless of hemoglobin value   At at all times try to transfuse 1 unit  prbc as possible with exception of active hemorrhage   Platelets  06/01/2020 -  Plat < 150k  And getting worse. Likely from presumed sepsis  Plan  scd and moitor   Transaminitis - barcitinib held 10/5  06/01/2020 -> immproing  Plan  - monitor  Baseline uncontrolled DM2 now exacerbated by steroids P: Continue SSI and TF coverage CBG improved with increased levimir CBG q4hrs  CBG goal 140-180  Hypothyroid P: Continue home synthroid   MISC Device  05/31/2020  - concern on CXR that "left subclav had herniated into Left IJ"  -> is actually a L IJ line and works fine per [employer]. Line working well 06/01/20 per RN  Plan  - monitor -a wait formal CXR report  Best practice:  Diet: start TF Pain/Anxiety/Delirium protocol (if indicated):  PAD VAP protocol (if indicated): in place DVT prophylaxis: SCDs given ABLA GI prophylaxis: ppi Glucose control: see above Mobility: bed rest  Code Status: full  Family Communication: Md could not get hold of hsuband and daughter 05/31/20. And daugther on cell again 06/01/20 and husband as well on his cell and home 06/01/20   Disposition: ICU     ATTESTATION & SIGNATURE   The patient MAKAELA CANDO is critically ill with multiple organ systems failure and requires high complexity decision making for assessment and support, frequent evaluation and titration of therapies, application of advanced monitoring technologies and extensive interpretation of multiple databases.   Critical Care Time devoted to patient  care services described in this note is  60  Minutes. This time reflects time of care of this signee Dr Brand Males. This critical care time does not reflect procedure time, or teaching time or supervisory time of PA/NP/Med student/Med Resident etc but could involve care discussion time     Dr. Brand Males, M.D., [facility].C.P Pulmonary and Critical Care Medicine Staff Physician [facility] Pager: [phone], If no answer or between  15:00h - 7:00h: call [phone]  06/01/2020 11:19 AM     LABS    PULMONARY Recent Labs  Lab 05/29/20 2024 05/30/20 0135 05/31/20 0536 06/01/20 0221 06/01/20 0558  PHART 7.232* 7.324* 7.317* 7.353 7.309*  PCO2ART 56.4* 44.7 52.9* 49.9* 53.8*  PO2ART 82* 55* 121* 52* 124*  HCO3 23.7 23.2 27.0 27.8 27.2  TCO2 25 25 29 29 29   O2SAT 93.0 86.0 98.0 84.0 98.0    CBC Recent Labs  Lab 05/31/20 0350 05/31/20 0536 05/31/20 1331 05/31/20 1331 06/01/20 0221 06/01/20 0243 06/01/20 0558  HGB 7.8*   < > 7.4*   < > 6.1* 7.0* 6.5*  HCT 23.9*   < > 22.5*   < > 18.0* 21.6* 19.0*  WBC 11.4*  --  13.7*  --   --  10.3  --   PLT 147*  --  145*  --   --  130*  --    < > = values in this interval not displayed.    COAGULATION Recent Labs  Lab 05/27/20 2024  INR 1.5*    CARDIAC  No results for input(s): TROPONINI in the last 168 hours. No results for input(s): PROBNP in the last 168 hours.   CHEMISTRY Recent Labs  Lab 05/28/20 8676 05/28/20 1323 05/28/20 1812 05/29/20 0348 05/29/20 0430 05/29/20 1644 05/29/20 1820 05/30/20 0455 05/30/20 0455 05/31/20 0350 05/31/20 0350 05/31/20 1950 05/31/20 0536 06/01/20 0221 06/01/20 0221 06/01/20 0243 06/01/20 0558  NA   < >  --   --  144   < > 145   < > 146*   < > 151*  --  149*  --  150*  --  152* 150*  K   < >  --   --  4.4   < > 4.9   < > 4.7   < > 4.7   < > 4.6   < > 4.9   < > 5.0 4.9  CL   < >  --   --  112*  --  113*  --  113*  --   114*  --   --   --   --   --  115*  --   CO2   < >  --   --  18*  --  20*  --  22  --  25  --   --   --   --   --  26  --   GLUCOSE   < >  --   --  249*  --  329*  --  215*  --  136*  --   --   --   --   --  175*  --   BUN   < >  --   --  90*  --  89*  --  96*  --  113*  --   --   --   --   --  118*  --   CREATININE   < >  --   --  3.66*  --  3.44*  --  3.45*  --  3.50*  --   --   --   --   --  3.27*  --   CALCIUM   < >  --   --  7.9*  --  8.0*  --  8.3*  --  8.9  --   --   --   --   --  8.9  --   MG  --  2.7* 2.7* 2.7*  --  2.8*  --   --   --   --   --   --   --   --   --  2.5*  --   PHOS  --  8.3* 7.9* 7.2*  --  6.4*  --   --   --   --   --   --   --   --   --  4.9*  --    < > = values in this interval not displayed.   Estimated Creatinine Clearance: 15.3 mL/min (A) (by C-G formula based on SCr of 3.27 mg/dL (H)).   LIVER Recent Labs  Lab 05/26/20 0439 05/27/20 0557 05/27/20 2024 06/01/20 0243  AST 178* 72*  --  13*  ALT 52* 47*  --  17  ALKPHOS 71 67  --  59  BILITOT 0.5 0.7  --  0.2*  PROT 6.1* 5.7*  --  4.8*  ALBUMIN 2.2* 2.6*  --  1.5*  INR  --   --  1.5*  --      INFECTIOUS Recent Labs  Lab 05/27/20 1716 05/27/20 2024 05/28/20 0155 05/29/20 0348  LATICACIDVEN  --  3.0* 1.6  --   PROCALCITON 0.58  --   --  0.88     ENDOCRINE CBG (last 3)  Recent Labs    05/31/20 2333 06/01/20 0316 06/01/20 0805  GLUCAP 124* 159* 181*         IMAGING x48h  -  image(s) personally visualized  -   highlighted in bold DG CHEST PORT 1 VIEW  Result Date: 05/31/2020 CLINICAL DATA:  COVID-19 pneumonia.  Endotracheal tube present. EXAM: PORTABLE CHEST 1 VIEW COMPARISON:  05/30/2020; 05/29/2020; 05/27/2020 FINDINGS: Grossly unchanged cardiac silhouette and mediastinal contours. Slight retraction of left subclavian vein approach central venous catheter with tip overlying the superior SVC and midportion herniated into the base of the left internal jugular vein. Otherwise, stable  position of support apparatus. No pneumothorax. Minimally improved aeration of lung bases with persistent bilateral interstitial opacities. No new focal airspace opacities. No acute osseous abnormalities. Old right mid clavicular fracture. Post cholecystectomy. IMPRESSION: 1. Slight retraction of left subclavian vein approach central venous catheter with tip overlying the superior SVC and midportion herniated into the base of the left internal jugular vein. Otherwise, stable position of support apparatus. No pneumothorax. 2. Minimally improved aeration of the lungs with otherwise similar findings suggestive of COVID 19 infection. Electronically Signed   By: Sandi Mariscal M.D.   On: 05/31/2020 12:37   DG CHEST PORT 1 VIEW  Result Date: 05/30/2020 CLINICAL DATA:  69 year old female with ARDS. Positive COVID-19. EXAM: PORTABLE CHEST 1 VIEW COMPARISON:  Chest radiograph dated 05/29/2020. FINDINGS: Endotracheal tube above the carina and left IJ central venous line with tip in similar position. Enteric tube extends below the diaphragm with tip beyond the inferior margin of the image. Faint bilateral confluent pulmonary densities again noted. No large pleural effusion or pneumothorax. Stable cardiac silhouette. Atherosclerotic calcification of the aorta. No acute osseous pathology. IMPRESSION: No significant interval change. Electronically Signed   By: Anner Crete M.D.   On: 05/30/2020 18:37

## 2020-06-01 NOTE — Progress Notes (Signed)
Patient placed in supine position. Suction catheter was able to pass down endotracheal tube. Patient remained stable during turn.

## 2020-06-02 ENCOUNTER — Inpatient Hospital Stay (HOSPITAL_COMMUNITY): Payer: Medicare HMO

## 2020-06-02 DIAGNOSIS — U071 COVID-19: Secondary | ICD-10-CM | POA: Diagnosis not present

## 2020-06-02 DIAGNOSIS — J9601 Acute respiratory failure with hypoxia: Secondary | ICD-10-CM | POA: Diagnosis not present

## 2020-06-02 LAB — COMPREHENSIVE METABOLIC PANEL WITH GFR
ALT: 16 U/L (ref 0–44)
AST: 17 U/L (ref 15–41)
Albumin: 1.4 g/dL — ABNORMAL LOW (ref 3.5–5.0)
Alkaline Phosphatase: 53 U/L (ref 38–126)
Anion gap: 10 (ref 5–15)
BUN: 118 mg/dL — ABNORMAL HIGH (ref 8–23)
CO2: 25 mmol/L (ref 22–32)
Calcium: 8.8 mg/dL — ABNORMAL LOW (ref 8.9–10.3)
Chloride: 120 mmol/L — ABNORMAL HIGH (ref 98–111)
Creatinine, Ser: 3.02 mg/dL — ABNORMAL HIGH (ref 0.44–1.00)
GFR, Estimated: 15 mL/min — ABNORMAL LOW (ref >60)
Glucose, Bld: 156 mg/dL — ABNORMAL HIGH (ref 70–99)
Potassium: 5 mmol/L (ref 3.5–5.1)
Sodium: 155 mmol/L — ABNORMAL HIGH (ref 135–145)
Total Bilirubin: 0.3 mg/dL (ref 0.3–1.2)
Total Protein: 5.1 g/dL — ABNORMAL LOW (ref 6.5–8.1)

## 2020-06-02 LAB — CBC
HCT: 23.3 % — ABNORMAL LOW (ref 36.0–46.0)
Hemoglobin: 7.4 g/dL — ABNORMAL LOW (ref 12.0–15.0)
MCH: 31.5 pg (ref 26.0–34.0)
MCHC: 31.8 g/dL (ref 30.0–36.0)
MCV: 99.1 fL (ref 80.0–100.0)
Platelets: 141 10*3/uL — ABNORMAL LOW (ref 150–400)
RBC: 2.35 MIL/uL — ABNORMAL LOW (ref 3.87–5.11)
RDW: 15.3 % (ref 11.5–15.5)
WBC: 16.5 10*3/uL — ABNORMAL HIGH (ref 4.0–10.5)
nRBC: 0 % (ref 0.0–0.2)

## 2020-06-02 LAB — BASIC METABOLIC PANEL WITH GFR
Anion gap: 11 (ref 5–15)
BUN: 123 mg/dL — ABNORMAL HIGH (ref 8–23)
CO2: 25 mmol/L (ref 22–32)
Calcium: 8.9 mg/dL (ref 8.9–10.3)
Chloride: 118 mmol/L — ABNORMAL HIGH (ref 98–111)
Creatinine, Ser: 2.96 mg/dL — ABNORMAL HIGH (ref 0.44–1.00)
GFR, Estimated: 16 mL/min — ABNORMAL LOW (ref >60)
Glucose, Bld: 107 mg/dL — ABNORMAL HIGH (ref 70–99)
Potassium: 4.7 mmol/L (ref 3.5–5.1)
Sodium: 154 mmol/L — ABNORMAL HIGH (ref 135–145)

## 2020-06-02 LAB — MAGNESIUM: Magnesium: 2.4 mg/dL (ref 1.7–2.4)

## 2020-06-02 LAB — CBC WITH DIFFERENTIAL/PLATELET
Abs Immature Granulocytes: 0.21 10*3/uL — ABNORMAL HIGH (ref 0.00–0.07)
Basophils Absolute: 0 10*3/uL (ref 0.0–0.1)
Basophils Relative: 0 %
Eosinophils Absolute: 0.2 10*3/uL (ref 0.0–0.5)
Eosinophils Relative: 1 %
HCT: 23 % — ABNORMAL LOW (ref 36.0–46.0)
Hemoglobin: 7.1 g/dL — ABNORMAL LOW (ref 12.0–15.0)
Immature Granulocytes: 1 %
Lymphocytes Relative: 4 %
Lymphs Abs: 0.7 10*3/uL (ref 0.7–4.0)
MCH: 30.7 pg (ref 26.0–34.0)
MCHC: 30.9 g/dL (ref 30.0–36.0)
MCV: 99.6 fL (ref 80.0–100.0)
Monocytes Absolute: 0.5 10*3/uL (ref 0.1–1.0)
Monocytes Relative: 3 %
Neutro Abs: 15.1 10*3/uL — ABNORMAL HIGH (ref 1.7–7.7)
Neutrophils Relative %: 91 %
Platelets: 129 10*3/uL — ABNORMAL LOW (ref 150–400)
RBC: 2.31 MIL/uL — ABNORMAL LOW (ref 3.87–5.11)
RDW: 15.6 % — ABNORMAL HIGH (ref 11.5–15.5)
WBC: 16.6 10*3/uL — ABNORMAL HIGH (ref 4.0–10.5)
nRBC: 0 % (ref 0.0–0.2)

## 2020-06-02 LAB — GLUCOSE, CAPILLARY
Glucose-Capillary: 122 mg/dL — ABNORMAL HIGH (ref 70–99)
Glucose-Capillary: 123 mg/dL — ABNORMAL HIGH (ref 70–99)
Glucose-Capillary: 146 mg/dL — ABNORMAL HIGH (ref 70–99)
Glucose-Capillary: 158 mg/dL — ABNORMAL HIGH (ref 70–99)
Glucose-Capillary: 59 mg/dL — ABNORMAL LOW (ref 70–99)
Glucose-Capillary: 86 mg/dL (ref 70–99)
Glucose-Capillary: 86 mg/dL (ref 70–99)
Glucose-Capillary: 96 mg/dL (ref 70–99)

## 2020-06-02 LAB — PHOSPHORUS: Phosphorus: 4 mg/dL (ref 2.5–4.6)

## 2020-06-02 LAB — PROCALCITONIN: Procalcitonin: 0.45 ng/mL

## 2020-06-02 MED ORDER — INSULIN ASPART 100 UNIT/ML ~~LOC~~ SOLN
5.0000 [IU] | SUBCUTANEOUS | Status: DC
  Administered 2020-06-02 – 2020-06-10 (×50): 5 [IU] via SUBCUTANEOUS

## 2020-06-02 MED ORDER — INSULIN DETEMIR 100 UNIT/ML ~~LOC~~ SOLN
20.0000 [IU] | Freq: Two times a day (BID) | SUBCUTANEOUS | Status: DC
  Administered 2020-06-02 – 2020-06-09 (×16): 20 [IU] via SUBCUTANEOUS
  Filled 2020-06-02 (×18): qty 0.2

## 2020-06-02 MED ORDER — DEXTROSE 50 % IV SOLN
INTRAVENOUS | Status: AC
  Administered 2020-06-02: 50 mL
  Filled 2020-06-02: qty 50

## 2020-06-02 MED ORDER — FREE WATER
200.0000 mL | Status: DC
  Administered 2020-06-02 – 2020-06-08 (×72): 200 mL

## 2020-06-02 MED ORDER — POLYETHYLENE GLYCOL 3350 17 G PO PACK
17.0000 g | PACK | Freq: Two times a day (BID) | ORAL | Status: DC
  Administered 2020-06-02 – 2020-06-14 (×20): 17 g
  Filled 2020-06-02 (×21): qty 1

## 2020-06-02 MED ORDER — STERILE WATER FOR INJECTION IJ SOLN
INTRAMUSCULAR | Status: AC
  Filled 2020-06-02: qty 10

## 2020-06-02 MED ORDER — HEPARIN SODIUM (PORCINE) 5000 UNIT/ML IJ SOLN
5000.0000 [IU] | Freq: Three times a day (TID) | INTRAMUSCULAR | Status: DC
  Administered 2020-06-02 – 2020-06-14 (×28): 5000 [IU] via SUBCUTANEOUS
  Filled 2020-06-02 (×31): qty 1

## 2020-06-02 MED ORDER — VITAL AF 1.2 CAL PO LIQD
1000.0000 mL | ORAL | Status: DC
  Administered 2020-06-02 – 2020-06-05 (×2): 1000 mL
  Filled 2020-06-02 (×3): qty 1000

## 2020-06-02 MED ORDER — POLYETHYLENE GLYCOL 3350 17 G PO PACK
17.0000 g | PACK | Freq: Every day | ORAL | Status: DC
  Administered 2020-06-02: 17 g via ORAL
  Filled 2020-06-02: qty 1

## 2020-06-02 MED ORDER — SENNOSIDES-DOCUSATE SODIUM 8.6-50 MG PO TABS
2.0000 | ORAL_TABLET | Freq: Two times a day (BID) | ORAL | Status: DC
  Administered 2020-06-02 – 2020-06-04 (×5): 2 via ORAL
  Filled 2020-06-02 (×5): qty 2

## 2020-06-02 NOTE — Progress Notes (Signed)
Pt prone at this time with 3 RNs and 1 RT with no complications.  Foam pads placed on cheeks, upper lip and forehead.  ETT secured at 21 at the lips with cloth tape.

## 2020-06-02 NOTE — Progress Notes (Signed)
[facility] Progress Note   Subjective:   Proned, hypoxic  No pressors  SCR improved slightly, remains azotemic, on dex  SNa 155 inc to FWF 200 q2h  1.8L UOP  I/Os yesterday:  1.8 / 1.8 all UOP  Objective Vitals:   06/02/20 1330 06/02/20 1400 06/02/20 1430 06/02/20 1500  BP: 102/63 105/62 (!) 103/59 (!) 108/59  Pulse: (!) 108 (!) 109 (!) 105 (!) 106  Resp: (!) 31 (!) 33 (!) 27 (!) 26  Temp: 98.8 F (37.1 C) 98.8 F (37.1 C) 99 F (37.2 C) 99.1 F (37.3 C)  TempSrc:      SpO2: 94% 94% 93% 92%  Weight:      Height:       Physical Exam Gen:  Intubated, sedated, proned CV: tachycardic Lungs: coarse, noted vent settings Extr: no edema GU: foley with clear yellow urine  Additional Objective Labs: Basic Metabolic Panel: Recent Labs  Lab 05/29/20 1644 05/29/20 1820 06/01/20 0243 06/01/20 0558 06/01/20 2217 06/02/20 0450 06/02/20 1050  NA 145   < > 152*   < > 153* 154* 155*  K 4.9   < > 5.0   < > 4.9 4.7 5.0  CL 113*   < > 115*  --   --  118* 120*  CO2 20*   < > 26  --   --  25 25  GLUCOSE 329*   < > 175*  --   --  107* 156*  BUN 89*   < > 118*  --   --  123* 118*  CREATININE 3.44*   < > 3.27*  --   --  2.96* 3.02*  CALCIUM 8.0*   < > 8.9  --   --  8.9 8.8*  PHOS 6.4*  --  4.9*  --   --  4.0  --    < > = values in this interval not displayed.   Liver Function Tests: Recent Labs  Lab 05/27/20 0557 06/01/20 0243 06/02/20 1050  AST 72* 13* 17  ALT 47* 17 16  ALKPHOS 67 59 53  BILITOT 0.7 0.2* 0.3  PROT 5.7* 4.8* 5.1*  ALBUMIN 2.6* 1.5* 1.4*   No results for input(s): LIPASE, AMYLASE in the last 168 hours. CBC: Recent Labs  Lab 05/27/20 0557 05/27/20 1716 05/31/20 1331 06/01/20 0221 06/01/20 0243 06/01/20 0558 06/01/20 1138 06/01/20 1138 06/01/20 2217 06/02/20 0450 06/02/20 1050  WBC 15.5*   < > 13.7*  --  10.3   < > 13.3*  --   --  16.5* 16.6*  NEUTROABS 12.8*  --   --   --   --   --   --   --   --   --  15.1*  HGB 8.2*    < > 7.4*   < > 7.0*   < > 7.3*   < > 8.5* 7.4* 7.1*  HCT 24.5*   < > 22.5*   < > 21.6*   < > 23.2*   < > 25.0* 23.3* 23.0*  MCV 95.0   < > 94.1  --  96.0  --  97.5  --   --  99.1 99.6  PLT 319   < > 145*  --  130*   < > 151  --   --  141* 129*   < > = values in this interval not displayed.   Blood Culture    Component Value Date/Time   SDES TRACHEAL ASPIRATE 06/01/2020 1337  SPECREQUEST NONE 06/01/2020 1337   CULT  06/01/2020 1337    CULTURE REINCUBATED FOR BETTER GROWTH Performed at [facility] [address], [place], [place] [address]    REPTSTATUS PENDING 06/01/2020 1337    Cardiac Enzymes: No results for input(s): CKTOTAL, CKMB, CKMBINDEX, TROPONINI in the last 168 hours. CBG: Recent Labs  Lab 06/02/20 0113 06/02/20 0331 06/02/20 0737 06/02/20 1107 06/02/20 1528  GLUCAP 86 86 96 123* 158*   Iron Studies:  No results for input(s): IRON, TIBC, TRANSFERRIN, FERRITIN in the last 72 hours. _0 @ Studies/Results: DG CHEST PORT 1 VIEW  Result Date: 06/02/2020 CLINICAL DATA:  COVID positive. EXAM: PORTABLE CHEST 1 VIEW COMPARISON:  06/01/2020 FINDINGS: 0422 hours. Endotracheal tube tip is 3.4 cm above the base of the carina. The NG tube passes into the stomach although the distal tip position is not included on the film. Left IJ central line tip overlies the proximal SVC level. Diffuse interstitial and patchy basilar predominant airspace disease is similar to prior. The cardiopericardial silhouette is within normal limits for size. Bones are diffusely demineralized. Telemetry leads overlie the chest. IMPRESSION: 1. No substantial interval change in exam. Diffuse interstitial and patchy basilar predominant airspace disease. 2. Support apparatus as above. Electronically Signed   By: Misty Stanley M.D.   On: 06/02/2020 06:27   DG Chest Port 1 View  Result Date: 06/01/2020 CLINICAL DATA:  Respiratory failure EXAM: PORTABLE CHEST 1 VIEW COMPARISON:  06/01/2020 at  5:37 a.m. FINDINGS: Endotracheal tube is seen 4.0 cm above the carina. Nasogastric tube extends into the upper abdomen beyond the margin of the examination. Left internal jugular central venous catheter seen with its tip within the superior vena cava. The lungs are symmetrically expanded. Superimposed diffuse airspace infiltrate, slight more prevalent within the lung bases bilaterally, appears stable. No pneumothorax or pleural effusion. Cardiac size within normal limits. No acute bone abnormality. IMPRESSION: Support lines and tubes are unchanged. Stable diffuse pulmonary infiltrate, edema versus infection. Electronically Signed   By: Fidela Salisbury MD   On: 06/01/2020 22:21   DG Chest Port 1 View  Result Date: 06/01/2020 CLINICAL DATA:  Reason for exam: Endotracheal tube present in COVID + pt. Pt is PRONE EXAM: PORTABLE CHEST - 1 VIEW COMPARISON:  05/31/2020 FINDINGS: Endotracheal tube, gastric tube, left IJ central venous catheter stable in position. Some increase in coarse interstitial opacities throughout both lung fields with patchy airspace opacities in the lung bases and left mid lung slightly increased. Heart size and mediastinal contours are within normal limits. Aortic Atherosclerosis (ICD10-170.0). No effusion.  No pneumothorax. Visualized bones unremarkable. IMPRESSION: Worsening asymmetric infiltrates or edema as above. Electronically Signed   By: Lucrezia Europe M.D.   On: 06/01/2020 11:20   Medications: . sodium chloride    . ceFEPime (MAXIPIME) IV Stopped (06/02/20 1204)  . feeding supplement (VITAL AF 1.2 CAL) 1,000 mL (06/02/20 1456)  . fentaNYL infusion INTRAVENOUS 400 mcg/hr (06/02/20 1550)  . midazolam 10 mg/hr (06/02/20 1500)   . sodium chloride   Intravenous Once  . sodium chloride   Intravenous Once  . artificial tears   Both Eyes Q8H  . chlorhexidine gluconate (MEDLINE KIT)  15 mL Mouth Rinse BID  . Chlorhexidine Gluconate Cloth  6 each Topical Daily  . free water  200 mL Per  Tube Q2H  . heparin  5,000 Units Subcutaneous Q8H  . insulin aspart  0-20 Units Subcutaneous Q4H  . insulin aspart  5 Units Subcutaneous Q4H  .  insulin detemir  20 Units Subcutaneous BID  . levothyroxine  175 mcg Per Tube Q0600  . linagliptin  5 mg Per Tube Daily  . mouth rinse  15 mL Mouth Rinse 10 times per day  . methylPREDNISolone (SOLU-MEDROL) injection  20 mg Intravenous Q12H   Followed by  . [START ON 06/04/2020] methylPREDNISolone (SOLU-MEDROL) injection  20 mg Intravenous Daily  . pantoprazole sodium  40 mg Per Tube Daily  . polyethylene glycol  17 g Per Tube BID  . senna-docusate  2 tablet Oral BID  . sodium bicarbonate  1,300 mg Per Tube BID  . sodium chloride flush  10-40 mL Intracatheter Q12H   Assessment/Plan **hypoxic respiratory failure: secondary to [place].  Vent and therapies per PCCM.   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  Presume she has ATN on top of baseline CKD.  UP/C < 2 --> not consistent with florid nephrosis/flare of membranous.  Started on prograf several months ago for persistant proteinuria but in setting of acute issues and shock have d/c'd due to vasoconstrictive effect (Also holding RAAS inhibition at this time).   Renal function stable to slightly improved currently with Cr 3.0 but noted BUN trending up (partially is catabolic state from steroids)  - holding diuretics, No indications for dialysis.  Should need arise family is ok with it but have asked to be notified prior to initiation.   **Hyperkalemia: normalized after 2 doses lokelma, follow  **Hypernatremia: Again,  ^ FWF frequency today.  **Mixed acidosis: secondary to AKI, enteral bicarb ordered, vent changes per primary.   **ABLA: rectus hematoma, some report of GI bleeding per RN.  Transfusions per primary.    Rexene Agent  06/02/2020, 3:50 PM  [employer]

## 2020-06-02 NOTE — Progress Notes (Signed)
NAME:  [name], MRN:  [id number], DOB:  [birth year], LOS: 55 ADMISSION DATE:  05/09/2020, CONSULTATION DATE:  10/5 REFERRING MD: Memon/ Triad, CHIEF COMPLAINT:  resp distress    Brief History     2 yowf , with history of T2DM, OSA, Hothyroidism, HTN, HLD, hepatitis, and GERD presents to the ED with a chief complaint of dyspnea.  Patient reports that symptom onset was 5 weeks ago, and started with diverticulitis.  Patient reports that since then she has had dyspnea and cough that started simultaneously.  The dyspnea and cough became worse over the last 2 weeks.  Patient reports that dyspnea is worse with exertion, and she can even walk now.  She reports that she fell on 9/29, which she attributes to being so weak.  She describes the fall as she was sitting in her chair got up took 2 steps and ended up on the ground.  She does not remember feeling lightheaded, or any preceding symptoms, and she did not hit her head.  Patient reports that she is weak because she has had decreased appetite and decreased p.o. intake since the diverticulitis 5 weeks pta.  Patient reports that her taste and smell are intact.  She does not have chest pain or palpitations.  She reports that her cough is a dry cough.  She had fevers for the first 3 weeks of her symptoms, but not for the last 2 weeks.  She reports that her T-max was 103.1, and Tylenol helps when she remembers to take it.  Patient has not had body aches, nausea vomiting, diarrhea, dysuria.  She does admit to shakiness, and vision changes over the last 2 weeks.  She reports that she cannot focus her eyes, so is blurry vision, but not double vision.  She does not have a sore throat and is not has clogged sinuses.  Patient did not get a Covid vaccine, because "I did not want 1."  Patient is refusing remdesivir.  Past Medical History  DM  II OSA Hypothyroid HBP HLD GERD   Significant Hospital Events   05/14/2020 - admit 05/30/20 - Sedated and prone . No acute  issues overnight received on dose of NMB last night and again this AM  10/19 -  05/31/2020: On fentanyl infusion, Versed infusion.  Off Levophed infusion and then back on.  On tube feeds.  On the ventilator FiO2 60%. Peep 12 -> pulse ox 95%. Afebrile.  Supine since 4am. PF ratio 201  Consults:  PCCM  10/5  Procedures:  Oral ET  10/5  L IJ CVL  10/5   Significant Diagnostic Tests:  CT Abd L large rectus sheath hematoma, COVID PNA on visible lung fields  Micro Data:  Covid 19  PCR  9/30  POS MRSA PCR  10/1 >   Neg   Blood 05/27/2020: Negative Tracheal aspirate 05/28/2020: Rare Candida blood culture Blood 06/01/2020 Urine 06/01/2020 Tracheal aspirate 06/01/2020 Procalcitonin 06/01/2020  ID Rx:  Baricitinib 10/3 > 10/5 (stopped following abd pain and rise in LFT) Remdesivir [refused] Solu-Medrol 10/1  > (stop in 14 days total)  Interim history/subjective:   No overnight events. Remains proned.  Objective   Blood pressure (!) 118/52, pulse (!) 109, temperature 99.3 F (37.4 C), resp. rate (!) 30, height 5\' 1"  (1.549 m), weight 75 kg, SpO2 94 %.    Vent Mode: PRVC FiO2 (%):  [60 %-70 %] 60 % Set Rate:  [30 bmp] 30 bmp Vt Set:  [350 mL]  350 mL PEEP:  [12 cmH20] 12 cmH20 Plateau Pressure:  [24 cmH20] 24 cmH20   Intake/Output Summary (Last 24 hours) at 06/02/2020 1021 Last data filed at 06/02/2020 0932 Gross per 24 hour  Intake 1887.87 ml  Output 1680 ml  Net 207.87 ml   Filed Weights   05/29/20 0405 05/31/20 0500 06/01/20 0500  Weight: 73.8 kg 74.1 kg 75 kg   Physical exam   General: Obese woman, appears chronically ill. Intubated, sedated in prone position. HEENT: OG tube, ET tube in place with no skin breakdown. Minimal facial edema. Respiratory: Prone ventilation with no ventilator dyssynchrony. Fine crackles. Acceptable airway pressures. Cardiovascular: Extremities warm well perfused minimal edema. Unable to auscultate heart sounds due to prone position. Abdomen:  In prone position Neuro: No neuromuscular blockade. Deeply sedated RASS -4 Extremities: No skin breakdown Central line site intact.  Daily Goals Checklist  Pain/Anxiety/Delirium protocol (if indicated): Titrate midazolam and fentanyl infusions to keep RASS -4 while prone. VAP protocol (if indicated): Bundle in place Respiratory support goals: Keep her until tomorrow morning. Wean FiO2 as tolerated. Begin PEEP wean once FiO2 at 0.4 Blood pressure target: On no vasopressors. Maintain MAP greater than 65. DVT prophylaxis: Unfractionated heparin for DVT prophylaxis Nutrition Status: Tolerating tube feeds GI prophylaxis: Pantoprazole Fluid status goals: +3 L. Hold diuresis for now Urinary catheter: Guide hemodynamic management Central lines: Left subclavian central line Glucose control: Type 2 diabetes with stress hyperglycemia adequate control at present. Mobility/therapy needs: Bedrest Antibiotic de-escalation: Continue steroid taper. Stop antibiotics if cultures negative at 48 hours Home medication reconciliation: Continue home Synthroid Daily labs: Daily CBC and CMP ordered Code Status: Full code Family Communication: Daughter updated.  Disposition: ICU   Resolved Hospital Problem list    Hyperkalemia- resolved  Assessment & Plan:   Critically ill due to acute hypoxic/hypercapnic respiratory failure and sepsis (POA) due to severe ARDS from COVID19 pneumonia -Continue prone ventilation until tomorrow morning to get supine trials on day schedule. -Continue current low tidal volume ventilation strategy as airway pressures and oxygenation acceptable. Adequate gas exchange.  COVID -53 - unvaccinated. Refused remdesivir -Taper methylprednisolone.   Concern for sepsis 06/01/2020-now off vasopressors -Stop antibiotics if cultures negative at 48 hours  Acute on chronic renal failure - - thought to be ATN on baseline CKD Currently no indications for dialysis -Family is agreeable for HD  if needed   ABLA secondary to Rectus sheath hematoma seen on CT 10/6 -Transfuse as necessary  LABS    PULMONARY Recent Labs  Lab 05/30/20 0135 05/31/20 0536 06/01/20 0221 06/01/20 0558 06/01/20 2217  PHART 7.324* 7.317* 7.353 7.309* 7.381  PCO2ART 44.7 52.9* 49.9* 53.8* 46.5  PO2ART 55* 121* 52* 124* 81*  HCO3 23.2 27.0 27.8 27.2 27.7  TCO2 25 29 29 29 29   O2SAT 86.0 98.0 84.0 98.0 96.0    CBC Recent Labs  Lab 06/01/20 0243 06/01/20 0558 06/01/20 1138 06/01/20 2217 06/02/20 0450  HGB 7.0*   < > 7.3* 8.5* 7.4*  HCT 21.6*   < > 23.2* 25.0* 23.3*  WBC 10.3  --  13.3*  --  16.5*  PLT 130*  --  151  --  141*   < > = values in this interval not displayed.    COAGULATION Recent Labs  Lab 05/27/20 2024  INR 1.5*    CARDIAC  No results for input(s): TROPONINI in the last 168 hours. No results for input(s): PROBNP in the last 168 hours.   CHEMISTRY Recent Labs  Lab 05/28/20 0512 05/28/20 1812 05/29/20 0348 05/29/20 0430 05/29/20 1644 05/29/20 1820 05/30/20 0455 05/30/20 0455 05/31/20 0350 05/31/20 0536 06/01/20 0221 06/01/20 0221 06/01/20 0243 06/01/20 0243 06/01/20 0558 06/01/20 0558 06/01/20 2217 06/02/20 0450  NA   < >  --  144   < > 145   < > 146*   < > 151*   < > 150*  --  152*  --  150*  --  153* 154*  K   < >  --  4.4   < > 4.9   < > 4.7   < > 4.7   < > 4.9   < > 5.0   < > 4.9   < > 4.9 4.7  CL   < >  --  112*   < > 113*  --  113*  --  114*  --   --   --  115*  --   --   --   --  118*  CO2   < >  --  18*   < > 20*  --  22  --  25  --   --   --  26  --   --   --   --  25  GLUCOSE   < >  --  249*   < > 329*  --  215*  --  136*  --   --   --  175*  --   --   --   --  107*  BUN   < >  --  90*   < > 89*  --  96*  --  113*  --   --   --  118*  --   --   --   --  123*  CREATININE   < >  --  3.66*   < > 3.44*  --  3.45*  --  3.50*  --   --   --  3.27*  --   --   --   --  2.96*  CALCIUM   < >  --  7.9*   < > 8.0*  --  8.3*  --  8.9  --   --   --  8.9   --   --   --   --  8.9  MG  --  2.7* 2.7*  --  2.8*  --   --   --   --   --   --   --  2.5*  --   --   --   --  2.4  PHOS  --  7.9* 7.2*  --  6.4*  --   --   --   --   --   --   --  4.9*  --   --   --   --  4.0   < > = values in this interval not displayed.   Estimated Creatinine Clearance: 16.9 mL/min (A) (by C-G formula based on SCr of 2.96 mg/dL (H)).   LIVER Recent Labs  Lab 05/27/20 0557 05/27/20 2024 06/01/20 0243  AST 72*  --  13*  ALT 47*  --  17  ALKPHOS 67  --  59  BILITOT 0.7  --  0.2*  PROT 5.7*  --  4.8*  ALBUMIN 2.6*  --  1.5*  INR  --  1.5*  --      INFECTIOUS Recent Labs  Lab 05/27/20 1716 05/27/20  2024 05/28/20 0155 05/29/20 0348 06/01/20 1138 06/01/20 1139 06/02/20 0450  LATICACIDVEN  --  3.0* 1.6  --   --  1.1  --   PROCALCITON   < >  --   --  0.88 0.21  --  0.45   < > = values in this interval not displayed.     ENDOCRINE CBG (last 3)  Recent Labs    06/02/20 0113 06/02/20 0331 06/02/20 0737  GLUCAP 86 86 96   CRITICAL CARE Performed by: Kipp Brood   Total critical care time: 40 minutes  Critical care time was exclusive of separately billable procedures and treating other patients.  Critical care was necessary to treat or prevent imminent or life-threatening deterioration.  Critical care was time spent personally by me on the following activities: development of treatment plan with patient and/or surrogate as well as nursing, discussions with consultants, evaluation of patient's response to treatment, examination of patient, obtaining history from patient or surrogate, ordering and performing treatments and interventions, ordering and review of laboratory studies, ordering and review of radiographic studies, pulse oximetry, re-evaluation of patient's condition and participation in multidisciplinary rounds.  Kipp Brood, MD [facility] ICU Physician [place]  Pager: [phone] Mobile: [phone] After hours:  [phone].

## 2020-06-02 NOTE — Progress Notes (Signed)
Head turned at this time, no complications noted.  

## 2020-06-02 NOTE — Progress Notes (Addendum)
Nutrition Follow-up / Consult  DOCUMENTATION CODES:   Not applicable  INTERVENTION:   Continue tube feeding via OG tube: Increase Vital AF 1.2 to 65 ml/h (1560 ml per day) D/C Prosource TF 45 ml TID  Provides 1872 kcal, 117 gm protein, 1265 ml free water daily  NUTRITION DIAGNOSIS:   Inadequate oral intake related to inability to eat as evidenced by NPO status.  Ongoing   GOAL:   Patient will meet greater than or equal to 90% of their needs  Unmet  MONITOR:   Weight trends, Vent status, Labs, I & O's, TF tolerance  REASON FOR ASSESSMENT:   Consult Enteral/tube feeding initiation and management  ASSESSMENT:   69 yo female admitted to [facility] on 9/30 with 2 week hx of worsening dyspnea, cough, weakness, and decreased appetite. COVID positive. Intubated 10/5 and transferred to [facility] for treatment of COVID PNA. PMH includes DM-2, glomerulonephritis, OSA, hypothyroidism, HLD, hepatitis, GERD, diverticulitis.  Patient began prone positioning 10/8. She was proned this morning at ~4 am. Currently not requiring HD. Rectus hematoma causing GI bleeding. Patient is currently receiving Vital AF 1.2 via OGT at 45 ml/h (1080 ml/day) with Prosource TF 45 ml TID to provide 1416 kcals, 114 gm protein, 876 ml free water daily. Tolerating well. Free water flushes 200 ml every 2 hours.  Patient is currently intubated on ventilator support. MV: 10 L/min Temp (24hrs), Avg:98.9 F (37.2 C), Min:97.3 F (36.3 C), Max:100.4 F (38 C)   Labs reviewed. Na 155, BUN 118, Creatinine 3.02 CBG: [phone]  Medications reviewed and include novolog, levemir, tradjenta, solumedrol, senokot-s, sodium bicarb tablet, miralax.  Admission weight 78.9 kg Current weight 75 kg I/O +2.6 L UOP 1,765 ml x 24 h  Diet Order:   Diet Order            Diet NPO time specified  Diet effective now                 EDUCATION NEEDS:   No education needs have been identified at this time  Skin:  Skin  Assessment: Reviewed RN Assessment  Last BM:  10/11  Height:   Ht Readings from Last 1 Encounters:  05/30/20 5\' 1"  (1.549 m)    Weight:   Wt Readings from Last 1 Encounters:  06/01/20 75 kg    Ideal Body Weight:  47.7 kg  BMI:  Body mass index is 31.24 kg/m.  Estimated Nutritional Needs:   Kcal:  1820-2450  Protein:  105-140 gm  Fluid:  >/= 2 L/day    Lucas Mallow, RD, LDN, CNSC Please refer to Amion for contact information.

## 2020-06-03 DIAGNOSIS — U071 COVID-19: Secondary | ICD-10-CM | POA: Diagnosis not present

## 2020-06-03 DIAGNOSIS — J9601 Acute respiratory failure with hypoxia: Secondary | ICD-10-CM | POA: Diagnosis not present

## 2020-06-03 LAB — POCT I-STAT 7, (LYTES, BLD GAS, ICA,H+H)
Acid-Base Excess: 2 mmol/L (ref 0.0–2.0)
Acid-Base Excess: 1 mmol/L (ref 0.0–2.0)
Bicarbonate: 28.5 mmol/L — ABNORMAL HIGH (ref 20.0–28.0)
Bicarbonate: 29.6 mmol/L — ABNORMAL HIGH (ref 20.0–28.0)
Calcium, Ion: 1.31 mmol/L (ref 1.15–1.40)
Calcium, Ion: 1.31 mmol/L (ref 1.15–1.40)
HCT: 22 % — ABNORMAL LOW (ref 36.0–46.0)
HCT: 22 % — ABNORMAL LOW (ref 36.0–46.0)
Hemoglobin: 7.5 g/dL — ABNORMAL LOW (ref 12.0–15.0)
Hemoglobin: 7.5 g/dL — ABNORMAL LOW (ref 12.0–15.0)
O2 Saturation: 86 %
O2 Saturation: 96 %
Patient temperature: 99.1
Patient temperature: 99.9
Potassium: 5.3 mmol/L — ABNORMAL HIGH (ref 3.5–5.1)
Potassium: 5.6 mmol/L — ABNORMAL HIGH (ref 3.5–5.1)
Sodium: 147 mmol/L — ABNORMAL HIGH (ref 135–145)
Sodium: 145 mmol/L (ref 135–145)
TCO2: 30 mmol/L (ref 22–32)
TCO2: 32 mmol/L (ref 22–32)
pCO2 arterial: 58.9 mmHg — ABNORMAL HIGH (ref 32.0–48.0)
pCO2 arterial: 76.9 mmHg (ref 32.0–48.0)
pH, Arterial: 7.294 — ABNORMAL LOW (ref 7.350–7.450)
pH, Arterial: 7.198 — CL (ref 7.350–7.450)
pO2, Arterial: 60 mmHg — ABNORMAL LOW (ref 83.0–108.0)
pO2, Arterial: 106 mmHg (ref 83.0–108.0)

## 2020-06-03 LAB — CBC
HCT: 26.3 % — ABNORMAL LOW (ref 36.0–46.0)
Hemoglobin: 8.1 g/dL — ABNORMAL LOW (ref 12.0–15.0)
MCH: 30 pg (ref 26.0–34.0)
MCHC: 30.8 g/dL (ref 30.0–36.0)
MCV: 97.4 fL (ref 80.0–100.0)
Platelets: 115 10*3/uL — ABNORMAL LOW (ref 150–400)
RBC: 2.7 MIL/uL — ABNORMAL LOW (ref 3.87–5.11)
RDW: 16.1 % — ABNORMAL HIGH (ref 11.5–15.5)
WBC: 13.3 10*3/uL — ABNORMAL HIGH (ref 4.0–10.5)
nRBC: 0 % (ref 0.0–0.2)

## 2020-06-03 LAB — COMPREHENSIVE METABOLIC PANEL
ALT: 16 U/L (ref 0–44)
AST: 17 U/L (ref 15–41)
Albumin: 1.3 g/dL — ABNORMAL LOW (ref 3.5–5.0)
Alkaline Phosphatase: 58 U/L (ref 38–126)
Anion gap: 9 (ref 5–15)
BUN: 115 mg/dL — ABNORMAL HIGH (ref 8–23)
CO2: 25 mmol/L (ref 22–32)
Calcium: 8.7 mg/dL — ABNORMAL LOW (ref 8.9–10.3)
Chloride: 115 mmol/L — ABNORMAL HIGH (ref 98–111)
Creatinine, Ser: 2.71 mg/dL — ABNORMAL HIGH (ref 0.44–1.00)
GFR, Estimated: 17 mL/min — ABNORMAL LOW (ref >60)
Glucose, Bld: 202 mg/dL — ABNORMAL HIGH (ref 70–99)
Potassium: 5.4 mmol/L — ABNORMAL HIGH (ref 3.5–5.1)
Sodium: 149 mmol/L — ABNORMAL HIGH (ref 135–145)
Total Bilirubin: 0.2 mg/dL — ABNORMAL LOW (ref 0.3–1.2)
Total Protein: 5.1 g/dL — ABNORMAL LOW (ref 6.5–8.1)

## 2020-06-03 LAB — CBC WITH DIFFERENTIAL/PLATELET
Abs Immature Granulocytes: 0.17 10*3/uL — ABNORMAL HIGH (ref 0.00–0.07)
Basophils Absolute: 0 10*3/uL (ref 0.0–0.1)
Basophils Relative: 0 %
Eosinophils Absolute: 0 10*3/uL (ref 0.0–0.5)
Eosinophils Relative: 0 %
HCT: 20.6 % — ABNORMAL LOW (ref 36.0–46.0)
Hemoglobin: 6.3 g/dL — CL (ref 12.0–15.0)
Immature Granulocytes: 1 %
Lymphocytes Relative: 4 %
Lymphs Abs: 0.5 10*3/uL — ABNORMAL LOW (ref 0.7–4.0)
MCH: 30.6 pg (ref 26.0–34.0)
MCHC: 30.6 g/dL (ref 30.0–36.0)
MCV: 100 fL (ref 80.0–100.0)
Monocytes Absolute: 0.3 10*3/uL (ref 0.1–1.0)
Monocytes Relative: 2 %
Neutro Abs: 12.3 10*3/uL — ABNORMAL HIGH (ref 1.7–7.7)
Neutrophils Relative %: 93 %
Platelets: 114 10*3/uL — ABNORMAL LOW (ref 150–400)
RBC: 2.06 MIL/uL — ABNORMAL LOW (ref 3.87–5.11)
RDW: 15.2 % (ref 11.5–15.5)
WBC: 13.3 10*3/uL — ABNORMAL HIGH (ref 4.0–10.5)
nRBC: 0 % (ref 0.0–0.2)

## 2020-06-03 LAB — PHOSPHORUS: Phosphorus: 4.2 mg/dL (ref 2.5–4.6)

## 2020-06-03 LAB — GLUCOSE, CAPILLARY
Glucose-Capillary: 140 mg/dL — ABNORMAL HIGH (ref 70–99)
Glucose-Capillary: 150 mg/dL — ABNORMAL HIGH (ref 70–99)
Glucose-Capillary: 174 mg/dL — ABNORMAL HIGH (ref 70–99)
Glucose-Capillary: 176 mg/dL — ABNORMAL HIGH (ref 70–99)
Glucose-Capillary: 183 mg/dL — ABNORMAL HIGH (ref 70–99)
Glucose-Capillary: 193 mg/dL — ABNORMAL HIGH (ref 70–99)
Glucose-Capillary: 196 mg/dL — ABNORMAL HIGH (ref 70–99)
Glucose-Capillary: 207 mg/dL — ABNORMAL HIGH (ref 70–99)

## 2020-06-03 LAB — CULTURE, RESPIRATORY W GRAM STAIN

## 2020-06-03 LAB — PREPARE RBC (CROSSMATCH)

## 2020-06-03 LAB — PROCALCITONIN: Procalcitonin: 1.74 ng/mL

## 2020-06-03 MED ORDER — FUROSEMIDE 10 MG/ML IJ SOLN
60.0000 mg | Freq: Once | INTRAMUSCULAR | Status: AC
  Administered 2020-06-03: 60 mg via INTRAVENOUS
  Filled 2020-06-03: qty 6

## 2020-06-03 MED ORDER — SODIUM CHLORIDE 0.9% IV SOLUTION: Freq: Once | INTRAVENOUS | Status: AC

## 2020-06-03 MED ORDER — SODIUM CHLORIDE 0.9 % IV SOLN: 0.0000 mg/h | INTRAVENOUS | Status: DC

## 2020-06-03 MED ORDER — QUETIAPINE FUMARATE 25 MG PO TABS
25.0000 mg | ORAL_TABLET | Freq: Two times a day (BID) | ORAL | Status: DC
  Administered 2020-06-03 – 2020-06-11 (×18): 25 mg
  Filled 2020-06-03 (×18): qty 1

## 2020-06-03 MED ORDER — STERILE WATER FOR INJECTION IJ SOLN
INTRAMUSCULAR | Status: AC
  Filled 2020-06-03: qty 10

## 2020-06-03 MED ORDER — CLONAZEPAM 0.5 MG PO TBDP
1.0000 mg | ORAL_TABLET | Freq: Two times a day (BID) | ORAL | Status: DC
  Administered 2020-06-03 – 2020-06-11 (×18): 1 mg
  Filled 2020-06-03 (×18): qty 2

## 2020-06-03 MED ORDER — SODIUM CHLORIDE 0.9 % IV SOLN
0.0000 mg/h | INTRAVENOUS | Status: DC
  Administered 2020-06-03 (×3): 10 mg/h via INTRAVENOUS
  Administered 2020-06-04: 9 mg/h via INTRAVENOUS
  Administered 2020-06-04: 10 mg/h via INTRAVENOUS
  Administered 2020-06-05 – 2020-06-06 (×2): 9 mg/h via INTRAVENOUS
  Administered 2020-06-07 – 2020-06-09 (×7): 10 mg/h via INTRAVENOUS
  Administered 2020-06-10: 5 mg/h via INTRAVENOUS
  Administered 2020-06-10: 10 mg/h via INTRAVENOUS
  Administered 2020-06-11: 5 mg/h via INTRAVENOUS
  Administered 2020-06-12 – 2020-06-13 (×3): 6 mg/h via INTRAVENOUS
  Filled 2020-06-03 (×39): qty 20

## 2020-06-03 MED ORDER — OXYCODONE HCL 5 MG/5ML PO SOLN
7.5000 mg | Freq: Four times a day (QID) | ORAL | Status: DC
  Administered 2020-06-03 – 2020-06-04 (×4): 7.5 mg
  Filled 2020-06-03 (×4): qty 10

## 2020-06-03 NOTE — Progress Notes (Signed)
NAME:  [name], MRN:  [id number], DOB:  1951-04-06, LOS: 40 ADMISSION DATE:  05/04/2020, CONSULTATION DATE:  10/5 REFERRING MD: Memon/ Triad, CHIEF COMPLAINT:  resp distress    Brief History     74 yowf , with history of T2DM, OSA, Hothyroidism, HTN, HLD, hepatitis, and GERD presents to the ED with a chief complaint of dyspnea.  Patient reports that symptom onset was 5 weeks ago, and started with diverticulitis.  Patient reports that since then she has had dyspnea and cough that started simultaneously.  The dyspnea and cough became worse over the last 2 weeks.  Patient reports that dyspnea is worse with exertion, and she can even walk now.  She reports that she fell on 9/29, which she attributes to being so weak.  She describes the fall as she was sitting in her chair got up took 2 steps and ended up on the ground.  She does not remember feeling lightheaded, or any preceding symptoms, and she did not hit her head.  Patient reports that she is weak because she has had decreased appetite and decreased p.o. intake since the diverticulitis 5 weeks pta.  Patient reports that her taste and smell are intact.  She does not have chest pain or palpitations.  She reports that her cough is a dry cough.  She had fevers for the first 3 weeks of her symptoms, but not for the last 2 weeks.  She reports that her T-max was 103.1, and Tylenol helps when she remembers to take it.  Patient has not had body aches, nausea vomiting, diarrhea, dysuria.  She does admit to shakiness, and vision changes over the last 2 weeks.  She reports that she cannot focus her eyes, so is blurry vision, but not double vision.  She does not have a sore throat and is not has clogged sinuses.  Patient did not get a Covid vaccine, because "I did not want 1."  Patient is refusing remdesivir.  Past Medical History  DM  II OSA Hypothyroid HBP HLD GERD   Significant Hospital Events   05/01/2020 - admit 05/30/20 - Sedated and prone . No acute  issues overnight received on dose of NMB last night and again this AM  10/19 -  05/31/2020: On fentanyl infusion, Versed infusion.  Off Levophed infusion and then back on.  On tube feeds.  On the ventilator FiO2 60%. Peep 12 -> pulse ox 95%. Afebrile.  Supine since 4am. PF ratio 201  Consults:  PCCM  10/5  Procedures:  Oral ET  10/5  L IJ CVL  10/5   Significant Diagnostic Tests:  CT Abd L large rectus sheath hematoma, COVID PNA on visible lung fields  Micro Data:  Covid 19  PCR  9/30  POS MRSA PCR  10/1 >   Neg   Blood 05/27/2020: Negative Tracheal aspirate 05/28/2020: Rare Candida blood culture Blood 06/01/2020 Urine 06/01/2020 Tracheal aspirate 06/01/2020 Procalcitonin 06/01/2020  ID Rx:  Baricitinib 10/3 > 10/5 (stopped following abd pain and rise in LFT) Remdesivir [refused] Solu-Medrol 10/1  > (stop in 14 days total)  Interim history/subjective:   Resume supine ventilation this morning.  Immediate desaturation requiring increase in FiO2. Left supine for pressure relief for 8 hours prior to resuming prone position.  Objective   Blood pressure (!) 104/57, pulse 99, temperature 99.9 F (37.7 C), resp. rate (!) 43, height 5\' 1"  (1.549 m), weight 75.1 kg, SpO2 (!) 78 %.    Vent Mode: PRVC  FiO2 (%):  [50 %-80 %] 80 % Set Rate:  [30 bmp] 30 bmp Vt Set:  [350 mL] 350 mL PEEP:  [12 cmH20-15 cmH20] 15 cmH20 Plateau Pressure:  [15 cmH20-25 cmH20] 25 cmH20   Intake/Output Summary (Last 24 hours) at 06/03/2020 1812 Last data filed at 06/03/2020 1700 Gross per 24 hour  Intake 3255.26 ml  Output 2210 ml  Net 1045.26 ml   Filed Weights   05/31/20 0500 06/01/20 0500 06/03/20 0500  Weight: 74.1 kg 75 kg 75.1 kg   Physical exam   General: Obese woman, appears chronically ill. Intubated, sedated in supine position. HEENT: OG tube, ET tube in place with no skin breakdown. Minimal facial edema. Respiratory: Supine ventilation with no ventilator dyssynchrony. Fine crackles.  Acceptable airway pressures. Cardiovascular: Extremities warm well perfused minimal edema. Unable to auscultate heart sounds due to prone position. Abdomen: In prone position Neuro: No neuromuscular blockade. Deeply sedated RASS -4, no response to pain or voice. Extremities: No skin breakdown Central line site intact.  Daily Goals Checklist  Pain/Anxiety/Delirium protocol (if indicated): Titrate midazolam and fentanyl infusions to keep RASS -4 while prone.  Start enteral sedatives VAP protocol (if indicated): Bundle in place Respiratory support goals: Resume prone ventilation, follow PF ratio wean FiO2 as tolerated. Begin PEEP wean once FiO2 at 0.4 Blood pressure target: On no vasopressors. Maintain MAP greater than 65. DVT prophylaxis: Unfractionated heparin for DVT prophylaxis Nutrition Status: Tolerating tube feeds GI prophylaxis: Pantoprazole Fluid status goals: +3 L.  Diurese today Urinary catheter: Guide hemodynamic management Central lines: Left subclavian central line Glucose control: Type 2 diabetes with stress hyperglycemia adequate control at present. Mobility/therapy needs: Bedrest Antibiotic de-escalation: Continue steroid taper. Stop antibiotics if cultures negative at 48 hours Home medication reconciliation: Continue home Synthroid Daily labs: Daily CBC and CMP ordered Code Status: Full code Family Communication: Daughter updated 10/11 Disposition: ICU   Resolved Hospital Problem list    Hyperkalemia- resolved  Assessment & Plan:   Critically ill due to acute hypoxic/hypercapnic respiratory failure and sepsis (POA) due to severe ARDS from COVID19 pneumonia -Continue prone ventilation until tomorrow morning to get supine trials on day schedule. -Continue current low tidal volume ventilation strategy as airway pressures and oxygenation acceptable. Adequate gas exchange.  COVID -57 - unvaccinated. Refused remdesivir -Taper methylprednisolone.   Concern for sepsis  06/01/2020-now off vasopressors -Stop antibiotics if cultures negative at 48 hours  Acute on chronic renal failure - - thought to be ATN on baseline CKD Currently no indications for dialysis -Family is agreeable for HD if needed   ABLA secondary to Rectus sheath hematoma seen on CT 10/6 -Transfuse as necessary  LABS    PULMONARY Recent Labs  Lab 05/31/20 0536 06/01/20 0221 06/01/20 0558 06/01/20 2217 06/03/20 1617  PHART 7.317* 7.353 7.309* 7.381 7.294*  PCO2ART 52.9* 49.9* 53.8* 46.5 58.9*  PO2ART 121* 52* 124* 81* 60*  HCO3 27.0 27.8 27.2 27.7 28.5*  TCO2 29 29 29 29 30   O2SAT 98.0 84.0 98.0 96.0 86.0    CBC Recent Labs  Lab 06/02/20 1050 06/02/20 1050 06/03/20 0429 06/03/20 1044 06/03/20 1617  HGB 7.1*   < > 6.3* 8.1* 7.5*  HCT 23.0*   < > 20.6* 26.3* 22.0*  WBC 16.6*  --  13.3* 13.3*  --   PLT 129*  --  114* 115*  --    < > = values in this interval not displayed.    COAGULATION Recent Labs  Lab 05/27/20 2024  INR  1.5*    CARDIAC  No results for input(s): TROPONINI in the last 168 hours. No results for input(s): PROBNP in the last 168 hours.   CHEMISTRY Recent Labs  Lab 05/28/20 0512 05/28/20 1812 05/29/20 0348 05/29/20 0430 05/29/20 1644 05/29/20 1820 05/31/20 0350 05/31/20 0536 06/01/20 0243 06/01/20 0558 06/01/20 2217 06/01/20 2217 06/02/20 0450 06/02/20 0450 06/02/20 1050 06/02/20 1050 06/03/20 0429 06/03/20 1617  NA   < >  --  144   < > 145   < > 151*   < > 152*   < > 153*  --  154*  --  155*  --  149* 147*  K   < >  --  4.4   < > 4.9   < > 4.7   < > 5.0   < > 4.9   < > 4.7   < > 5.0   < > 5.4* 5.3*  CL   < >  --  112*   < > 113*   < > 114*  --  115*  --   --   --  118*  --  120*  --  115*  --   CO2   < >  --  18*   < > 20*   < > 25  --  26  --   --   --  25  --  25  --  25  --   GLUCOSE   < >  --  249*   < > 329*   < > 136*  --  175*  --   --   --  107*  --  156*  --  202*  --   BUN   < >  --  90*   < > 89*   < > 113*  --   118*  --   --   --  123*  --  118*  --  115*  --   CREATININE   < >  --  3.66*   < > 3.44*   < > 3.50*  --  3.27*  --   --   --  2.96*  --  3.02*  --  2.71*  --   CALCIUM   < >  --  7.9*   < > 8.0*   < > 8.9  --  8.9  --   --   --  8.9  --  8.8*  --  8.7*  --   MG  --  2.7* 2.7*  --  2.8*  --   --   --  2.5*  --   --   --  2.4  --   --   --   --   --   PHOS   < > 7.9* 7.2*  --  6.4*  --   --   --  4.9*  --   --   --  4.0  --   --   --  4.2  --    < > = values in this interval not displayed.   Estimated Creatinine Clearance: 18.4 mL/min (A) (by C-G formula based on SCr of 2.71 mg/dL (H)).   LIVER Recent Labs  Lab 05/27/20 2024 06/01/20 0243 06/02/20 1050 06/03/20 0429  AST  --  13* 17 17  ALT  --  17 16 16   ALKPHOS  --  59 53 58  BILITOT  --  0.2* 0.3 0.2*  PROT  --  4.8* 5.1* 5.1*  ALBUMIN  --  1.5* 1.4* 1.3*  INR 1.5*  --   --   --      INFECTIOUS Recent Labs  Lab 05/27/20 2024 05/28/20 0155 05/29/20 0348 06/01/20 1138 06/01/20 1139 06/02/20 0450 06/03/20 0429  LATICACIDVEN 3.0* 1.6  --   --  1.1  --   --   PROCALCITON  --   --    < > 0.21  --  0.45 1.74   < > = values in this interval not displayed.     ENDOCRINE CBG (last 3)  Recent Labs    06/03/20 1128 06/03/20 1154 06/03/20 1603  GLUCAP 174* 176* 140*   CRITICAL CARE Performed by: Kipp Brood   Total critical care time: 40 minutes  Critical care time was exclusive of separately billable procedures and treating other patients.  Critical care was necessary to treat or prevent imminent or life-threatening deterioration.  Critical care was time spent personally by me on the following activities: development of treatment plan with patient and/or surrogate as well as nursing, discussions with consultants, evaluation of patient's response to treatment, examination of patient, obtaining history from patient or surrogate, ordering and performing treatments and interventions, ordering and review of  laboratory studies, ordering and review of radiographic studies, pulse oximetry, re-evaluation of patient's condition and participation in multidisciplinary rounds.  Kipp Brood, MD [facility] ICU Physician [place]  Pager: [phone] Mobile: [phone] After hours: [phone].

## 2020-06-03 NOTE — Progress Notes (Signed)
Patient SpO2 was 66% immediately after being supine. Patient's SpO2 was at 88% in less than 5 minutes. FiO2 increased from 60% to 80% at this time.

## 2020-06-03 NOTE — Progress Notes (Signed)
Head turned at this time, no complications noted.  

## 2020-06-03 NOTE — Progress Notes (Signed)
Assisted with un prone. Increased FIO2 to 80% D/T low spo2.

## 2020-06-03 NOTE — Progress Notes (Signed)
[facility] [place] Progress Note Patient Name: [name] DOB: 17-Oct-1950 MRN: [id number]   Date of Service  06/03/2020  HPI/Events of Note  Anemia - Hgb = 6.3.   eICU Interventions  Will transfuse 1 unit PRBC now.      Intervention Category Major Interventions: Other:  Shalimar Mcclain Cornelia Copa 06/03/2020, 6:10 AM

## 2020-06-03 NOTE — Progress Notes (Signed)
[facility] Progress Note   Subjective:   Remains hypoxic FIO2 80%  SNa and SCr improved  1.7L UOP  I/Os yesterday:  1.8 / 1.8 all UOP  Objective Vitals:   06/03/20 0908 06/03/20 0911 06/03/20 1000 06/03/20 1100  BP: (!) 130/55 (!) 130/55 122/65 124/65  Pulse:   99 97  Resp: (!) 30 (!) 30 (!) 30 (!) 30  Temp: 98.1 F (36.7 C) 98.1 F (36.7 C) 97.7 F (36.5 C) (!) 97.3 F (36.3 C)  TempSrc:      SpO2:  93% (!) 88% 93%  Weight:      Height:       Physical Exam Gen:  Intubated, sedated, proned CV: tachycardic Lungs: coarse, noted vent settings Extr: no edema GU: foley with clear yellow urine  Additional Objective Labs: Basic Metabolic Panel: Recent Labs  Lab 06/01/20 0243 06/01/20 0558 06/02/20 0450 06/02/20 1050 06/03/20 0429  NA 152*   < > 154* 155* 149*  K 5.0   < > 4.7 5.0 5.4*  CL 115*   < > 118* 120* 115*  CO2 26   < > '25 25 25  ' GLUCOSE 175*   < > 107* 156* 202*  BUN 118*   < > 123* 118* 115*  CREATININE 3.27*   < > 2.96* 3.02* 2.71*  CALCIUM 8.9   < > 8.9 8.8* 8.7*  PHOS 4.9*  --  4.0  --  4.2   < > = values in this interval not displayed.   Liver Function Tests: Recent Labs  Lab 06/01/20 0243 06/02/20 1050 06/03/20 0429  AST 13* 17 17  ALT '17 16 16  ' ALKPHOS 59 53 58  BILITOT 0.2* 0.3 0.2*  PROT 4.8* 5.1* 5.1*  ALBUMIN 1.5* 1.4* 1.3*   No results for input(s): LIPASE, AMYLASE in the last 168 hours. CBC: Recent Labs  Lab 06/01/20 1138 06/01/20 2217 06/02/20 0450 06/02/20 0450 06/02/20 1050 06/03/20 0429 06/03/20 1044  WBC 13.3*  --  16.5*   < > 16.6* 13.3* 13.3*  NEUTROABS  --   --   --   --  15.1* 12.3*  --   HGB 7.3*   < > 7.4*   < > 7.1* 6.3* 8.1*  HCT 23.2*   < > 23.3*   < > 23.0* 20.6* 26.3*  MCV 97.5  --  99.1  --  99.6 100.0 97.4  PLT 151  --  141*   < > 129* 114* 115*   < > = values in this interval not displayed.   Blood Culture    Component Value Date/Time   SDES TRACHEAL ASPIRATE 06/01/2020 1337    SPECREQUEST NONE 06/01/2020 1337   CULT FEW CANDIDA ALBICANS 06/01/2020 1337   REPTSTATUS 06/03/2020 FINAL 06/01/2020 1337    Cardiac Enzymes: No results for input(s): CKTOTAL, CKMB, CKMBINDEX, TROPONINI in the last 168 hours. CBG: Recent Labs  Lab 06/02/20 2323 06/03/20 0347 06/03/20 0851 06/03/20 1128 06/03/20 1154  GLUCAP 146* 183* 207* 174* 176*   Iron Studies:  No results for input(s): IRON, TIBC, TRANSFERRIN, FERRITIN in the last 72 hours. '@[email or web address]' @ Studies/Results: DG CHEST PORT 1 VIEW  Result Date: 06/02/2020 CLINICAL DATA:  COVID positive. EXAM: PORTABLE CHEST 1 VIEW COMPARISON:  06/01/2020 FINDINGS: 0422 hours. Endotracheal tube tip is 3.4 cm above the base of the carina. The NG tube passes into the stomach although the distal tip position is not included on the film. Left IJ central line tip overlies the proximal  SVC level. Diffuse interstitial and patchy basilar predominant airspace disease is similar to prior. The cardiopericardial silhouette is within normal limits for size. Bones are diffusely demineralized. Telemetry leads overlie the chest. IMPRESSION: 1. No substantial interval change in exam. Diffuse interstitial and patchy basilar predominant airspace disease. 2. Support apparatus as above. Electronically Signed   By: Misty Stanley M.D.   On: 06/02/2020 06:27   DG Chest Port 1 View  Result Date: 06/01/2020 CLINICAL DATA:  Respiratory failure EXAM: PORTABLE CHEST 1 VIEW COMPARISON:  06/01/2020 at 5:37 a.m. FINDINGS: Endotracheal tube is seen 4.0 cm above the carina. Nasogastric tube extends into the upper abdomen beyond the margin of the examination. Left internal jugular central venous catheter seen with its tip within the superior vena cava. The lungs are symmetrically expanded. Superimposed diffuse airspace infiltrate, slight more prevalent within the lung bases bilaterally, appears stable. No pneumothorax or pleural effusion. Cardiac size within normal  limits. No acute bone abnormality. IMPRESSION: Support lines and tubes are unchanged. Stable diffuse pulmonary infiltrate, edema versus infection. Electronically Signed   By: Fidela Salisbury MD   On: 06/01/2020 22:21   Medications: . sodium chloride    . feeding supplement (VITAL AF 1.2 CAL) 1,000 mL (06/02/20 1456)  . fentaNYL infusion INTRAVENOUS 400 mcg/hr (06/03/20 1200)  . midazolam (VERSED) infusion 10 mg/hr (06/03/20 0340)   . sodium chloride   Intravenous Once  . sodium chloride   Intravenous Once  . artificial tears   Both Eyes Q8H  . chlorhexidine gluconate (MEDLINE KIT)  15 mL Mouth Rinse BID  . Chlorhexidine Gluconate Cloth  6 each Topical Daily  . clonazepam  1 mg Per Tube BID  . free water  200 mL Per Tube Q2H  . heparin  5,000 Units Subcutaneous Q8H  . insulin aspart  0-20 Units Subcutaneous Q4H  . insulin aspart  5 Units Subcutaneous Q4H  . insulin detemir  20 Units Subcutaneous BID  . levothyroxine  175 mcg Per Tube Q0600  . linagliptin  5 mg Per Tube Daily  . mouth rinse  15 mL Mouth Rinse 10 times per day  . methylPREDNISolone (SOLU-MEDROL) injection  20 mg Intravenous Q12H   Followed by  . [START ON 06/04/2020] methylPREDNISolone (SOLU-MEDROL) injection  20 mg Intravenous Daily  . oxyCODONE  7.5 mg Per Tube Q6H  . pantoprazole sodium  40 mg Per Tube Daily  . polyethylene glycol  17 g Per Tube BID  . QUEtiapine  25 mg Per Tube BID  . senna-docusate  2 tablet Oral BID  . sodium bicarbonate  1,300 mg Per Tube BID  . sodium chloride flush  10-40 mL Intracatheter Q12H   Assessment/Plan **hypoxic respiratory failure: secondary to [place].  Vent and therapies per PCCM.   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  Presume she has ATN on top of baseline CKD.  UP/C < 2 --> not consistent with florid nephrosis/flare of membranous.  Started on prograf several  months ago for persistant proteinuria but in setting of acute issues and shock have d/c'd due to vasoconstrictive effect (Also holding RAAS inhibition at this time).   Renal function improving currently; sig azotemia (partially is catabolic state from steroids).  Likely for ongoing further recovery. Cont hdyration. No further recommendations at this tmie.  WIll sign off but call for questions or concerns., No indications for dialysis.  Should need  arise family is ok with it but have asked to be notified prior to initiation.   **Hyperkalemia: Keep < 5.5, prn lokelma  **Hypernatremia: Cont FWF.  **Mixed acidosis: secondary to AKI, enteral bicarb, vent changes per primary.   **ABLA: rectus hematoma, some report of GI bleeding per RN.  Transfusions per primary.    Rexene Agent  06/03/2020, 12:25 PM  [facility]

## 2020-06-03 NOTE — Progress Notes (Signed)
[place] Progress Note Patient Name: EVAH RASHID DOB: September 03, 1950 MRN: [id number]   Date of Service  06/03/2020  HPI/Events of Note  PH 7.19, PCO2 76.9 on post-proning ABG, RR 30.  eICU Interventions  RR increased to 35, ABG at midnight.        Kerry Kass [place] 06/03/2020, 9:59 PM

## 2020-06-04 DIAGNOSIS — J9601 Acute respiratory failure with hypoxia: Secondary | ICD-10-CM | POA: Diagnosis not present

## 2020-06-04 DIAGNOSIS — U071 COVID-19: Secondary | ICD-10-CM | POA: Diagnosis not present

## 2020-06-04 LAB — COMPREHENSIVE METABOLIC PANEL
ALT: 17 U/L (ref 0–44)
AST: 17 U/L (ref 15–41)
Albumin: 1.2 g/dL — ABNORMAL LOW (ref 3.5–5.0)
Alkaline Phosphatase: 53 U/L (ref 38–126)
Anion gap: 11 (ref 5–15)
BUN: 113 mg/dL — ABNORMAL HIGH (ref 8–23)
CO2: 24 mmol/L (ref 22–32)
Calcium: 8.5 mg/dL — ABNORMAL LOW (ref 8.9–10.3)
Chloride: 110 mmol/L (ref 98–111)
Creatinine, Ser: 2.64 mg/dL — ABNORMAL HIGH (ref 0.44–1.00)
GFR, Estimated: 18 mL/min — ABNORMAL LOW (ref >60)
Glucose, Bld: 195 mg/dL — ABNORMAL HIGH (ref 70–99)
Potassium: 5.8 mmol/L — ABNORMAL HIGH (ref 3.5–5.1)
Sodium: 145 mmol/L (ref 135–145)
Total Bilirubin: 0.5 mg/dL (ref 0.3–1.2)
Total Protein: 4.8 g/dL — ABNORMAL LOW (ref 6.5–8.1)

## 2020-06-04 LAB — CBC WITH DIFFERENTIAL/PLATELET
Abs Immature Granulocytes: 0.09 10*3/uL — ABNORMAL HIGH (ref 0.00–0.07)
Abs Immature Granulocytes: 0.12 10*3/uL — ABNORMAL HIGH (ref 0.00–0.07)
Basophils Absolute: 0 10*3/uL (ref 0.0–0.1)
Basophils Absolute: 0 10*3/uL (ref 0.0–0.1)
Basophils Relative: 0 %
Basophils Relative: 0 %
Eosinophils Absolute: 0 10*3/uL (ref 0.0–0.5)
Eosinophils Absolute: 0 10*3/uL (ref 0.0–0.5)
Eosinophils Relative: 0 %
Eosinophils Relative: 0 %
HCT: 21.7 % — ABNORMAL LOW (ref 36.0–46.0)
HCT: 21.8 % — ABNORMAL LOW (ref 36.0–46.0)
Hemoglobin: 6.7 g/dL — CL (ref 12.0–15.0)
Hemoglobin: 6.7 g/dL — CL (ref 12.0–15.0)
Immature Granulocytes: 1 %
Immature Granulocytes: 1 %
Lymphocytes Relative: 4 %
Lymphocytes Relative: 5 %
Lymphs Abs: 0.3 10*3/uL — ABNORMAL LOW (ref 0.7–4.0)
Lymphs Abs: 0.4 10*3/uL — ABNORMAL LOW (ref 0.7–4.0)
MCH: 30 pg (ref 26.0–34.0)
MCH: 29.8 pg (ref 26.0–34.0)
MCHC: 30.9 g/dL (ref 30.0–36.0)
MCHC: 30.7 g/dL (ref 30.0–36.0)
MCV: 97.3 fL (ref 80.0–100.0)
MCV: 96.9 fL (ref 80.0–100.0)
Monocytes Absolute: 0.2 10*3/uL (ref 0.1–1.0)
Monocytes Absolute: 0.2 10*3/uL (ref 0.1–1.0)
Monocytes Relative: 2 %
Monocytes Relative: 2 %
Neutro Abs: 7.8 10*3/uL — ABNORMAL HIGH (ref 1.7–7.7)
Neutro Abs: 8.4 10*3/uL — ABNORMAL HIGH (ref 1.7–7.7)
Neutrophils Relative %: 93 %
Neutrophils Relative %: 92 %
Platelets: 95 10*3/uL — ABNORMAL LOW (ref 150–400)
Platelets: 88 10*3/uL — ABNORMAL LOW (ref 150–400)
RBC: 2.23 MIL/uL — ABNORMAL LOW (ref 3.87–5.11)
RBC: 2.25 MIL/uL — ABNORMAL LOW (ref 3.87–5.11)
RDW: 16.2 % — ABNORMAL HIGH (ref 11.5–15.5)
RDW: 15.9 % — ABNORMAL HIGH (ref 11.5–15.5)
WBC: 8.4 10*3/uL (ref 4.0–10.5)
WBC: 9.2 10*3/uL (ref 4.0–10.5)
nRBC: 0 % (ref 0.0–0.2)
nRBC: 0 % (ref 0.0–0.2)

## 2020-06-04 LAB — POCT I-STAT 7, (LYTES, BLD GAS, ICA,H+H)
Acid-Base Excess: 2 mmol/L (ref 0.0–2.0)
Bicarbonate: 29.4 mmol/L — ABNORMAL HIGH (ref 20.0–28.0)
Calcium, Ion: 1.3 mmol/L (ref 1.15–1.40)
HCT: 20 % — ABNORMAL LOW (ref 36.0–46.0)
Hemoglobin: 6.8 g/dL — CL (ref 12.0–15.0)
O2 Saturation: 98 %
Patient temperature: 36.9
Potassium: 6 mmol/L — ABNORMAL HIGH (ref 3.5–5.1)
Sodium: 144 mmol/L (ref 135–145)
TCO2: 31 mmol/L (ref 22–32)
pCO2 arterial: 65.9 mmHg (ref 32.0–48.0)
pH, Arterial: 7.256 — ABNORMAL LOW (ref 7.350–7.450)
pO2, Arterial: 118 mmHg — ABNORMAL HIGH (ref 83.0–108.0)

## 2020-06-04 LAB — GLUCOSE, CAPILLARY
Glucose-Capillary: 218 mg/dL — ABNORMAL HIGH (ref 70–99)
Glucose-Capillary: 162 mg/dL — ABNORMAL HIGH (ref 70–99)
Glucose-Capillary: 127 mg/dL — ABNORMAL HIGH (ref 70–99)
Glucose-Capillary: 135 mg/dL — ABNORMAL HIGH (ref 70–99)
Glucose-Capillary: 140 mg/dL — ABNORMAL HIGH (ref 70–99)

## 2020-06-04 LAB — BPAM RBC
Blood Product Expiration Date: 202111042359
ISSUE DATE / TIME: 202110120618
Unit Type and Rh: 7300

## 2020-06-04 LAB — TYPE AND SCREEN
ABO/RH(D): B POS
Antibody Screen: NEGATIVE
Unit division: 0

## 2020-06-04 LAB — PHOSPHORUS: Phosphorus: 5.9 mg/dL — ABNORMAL HIGH (ref 2.5–4.6)

## 2020-06-04 LAB — PREPARE RBC (CROSSMATCH)

## 2020-06-04 MED ORDER — SODIUM CHLORIDE 0.9% IV SOLUTION: Freq: Once | INTRAVENOUS | Status: AC

## 2020-06-04 MED ORDER — DOCUSATE SODIUM 50 MG/5ML PO LIQD
200.0000 mg | Freq: Two times a day (BID) | ORAL | Status: DC
  Administered 2020-06-04 – 2020-06-14 (×11): 200 mg
  Filled 2020-06-04 (×17): qty 20

## 2020-06-04 MED ORDER — OXYCODONE HCL 5 MG PO TABS
10.0000 mg | ORAL_TABLET | Freq: Four times a day (QID) | ORAL | Status: DC
  Administered 2020-06-04 – 2020-06-14 (×39): 10 mg
  Filled 2020-06-04 (×39): qty 2

## 2020-06-04 MED ORDER — SENNA 8.6 MG PO TABS
2.0000 | ORAL_TABLET | Freq: Two times a day (BID) | ORAL | Status: DC
  Administered 2020-06-05 – 2020-06-11 (×8): 17.2 mg
  Filled 2020-06-04 (×17): qty 2

## 2020-06-04 MED ORDER — ACETAMINOPHEN 325 MG PO TABS
650.0000 mg | ORAL_TABLET | Freq: Four times a day (QID) | ORAL | Status: DC | PRN
  Administered 2020-06-10 – 2020-06-12 (×7): 650 mg
  Filled 2020-06-04 (×7): qty 2

## 2020-06-04 NOTE — Progress Notes (Signed)
RT note. Pt. Head turned from LT to RT without any complications.

## 2020-06-04 NOTE — Progress Notes (Signed)
[place] Progress Note Patient Name: [name] DOB: 06-Dec-1950 MRN: [id number]   Date of Service  06/04/2020  HPI/Events of Note  Hemoglobin 6.7 gm earlier today requiring transfusion of 1 unit PRBC.  eICU Interventions  Continue to hold SQ Heparin. Bilateral SCD's to be applied conscientiously.        Kerry Kass [name] 06/04/2020, 10:23 PM

## 2020-06-04 NOTE — Progress Notes (Signed)
NAME:  [name], MRN:  [id number], DOB:  07/29/1951, LOS: 71 ADMISSION DATE:  05/12/2020, CONSULTATION DATE:  10/5 REFERRING MD: Memon/ Triad, CHIEF COMPLAINT:  resp distress    Brief History     53 yowf , with history of T2DM, OSA, Hothyroidism, HTN, HLD, hepatitis, and GERD presents to the ED with a chief complaint of dyspnea.  Patient reports that symptom onset was 5 weeks ago, and started with diverticulitis.  Patient reports that since then she has had dyspnea and cough that started simultaneously.  The dyspnea and cough became worse over the last 2 weeks.  Patient reports that dyspnea is worse with exertion, and she can even walk now.  She reports that she fell on 9/29, which she attributes to being so weak.  She describes the fall as she was sitting in her chair got up took 2 steps and ended up on the ground.  She does not remember feeling lightheaded, or any preceding symptoms, and she did not hit her head.  Patient reports that she is weak because she has had decreased appetite and decreased p.o. intake since the diverticulitis 5 weeks pta.  Patient reports that her taste and smell are intact.  She does not have chest pain or palpitations.  She reports that her cough is a dry cough.  She had fevers for the first 3 weeks of her symptoms, but not for the last 2 weeks.  She reports that her T-max was 103.1, and Tylenol helps when she remembers to take it.  Patient has not had body aches, nausea vomiting, diarrhea, dysuria.  She does admit to shakiness, and vision changes over the last 2 weeks.  She reports that she cannot focus her eyes, so is blurry vision, but not double vision.  She does not have a sore throat and is not has clogged sinuses.  Patient did not get a Covid vaccine, because "I did not want 1."  Patient is refusing remdesivir.  Past Medical History  DM  II OSA Hypothyroid HBP HLD GERD   Significant Hospital Events   05/04/2020 - admit 05/30/20 - Sedated and prone . No acute  issues overnight received on dose of NMB last night and again this AM  10/19 -  05/31/2020: On fentanyl infusion, Versed infusion.  Off Levophed infusion and then back on.  On tube feeds.  On the ventilator FiO2 60%. Peep 12 -> pulse ox 95%. Afebrile.  Supine since 4am. PF ratio 201  Consults:  PCCM  10/5  Procedures:  Oral ET  10/5  L IJ CVL  10/5   Significant Diagnostic Tests:  CT Abd L large rectus sheath hematoma, COVID PNA on visible lung fields  Micro Data:  Covid 19  PCR  9/30  POS MRSA PCR  10/1 >   Neg   Blood 05/27/2020: Negative Tracheal aspirate 05/28/2020: Rare Candida blood culture Blood 06/01/2020 Urine 06/01/2020 Tracheal aspirate 06/01/2020 Procalcitonin 06/01/2020  ID Rx:  Baricitinib 10/3 > 10/5 (stopped following abd pain and rise in LFT) Remdesivir [refused] Solu-Medrol 10/1  > (stop in 14 days total)  Interim history/subjective:   Resumed supine ventilation this morning.  Slight increase in FiO2 requirement.  Transfusing for low hemoglobin.  Objective   Blood pressure (!) 134/58, pulse 81, temperature (!) 97.2 F (36.2 C), resp. rate (!) 35, height 5\' 1"  (1.549 m), weight 75.1 kg, SpO2 91 %.    Vent Mode: PRVC FiO2 (%):  [80 %-100 %] 100 % Set  Rate:  [30 bmp-35 bmp] 35 bmp Vt Set:  [350 mL] 350 mL PEEP:  [15 cmH20] 15 cmH20 Plateau Pressure:  [22 cmH20-29 cmH20] 29 cmH20   Intake/Output Summary (Last 24 hours) at 06/04/2020 1525 Last data filed at 06/04/2020 1444 Gross per 24 hour  Intake 2987.56 ml  Output 2810 ml  Net 177.56 ml   Filed Weights   06/01/20 0500 06/03/20 0500 06/04/20 0500  Weight: 75 kg 75.1 kg 75.1 kg   Physical exam   General: Obese woman, appears chronically ill. Intubated, sedated in supine position. HEENT: OG tube, ET tube in place with no skin breakdown. Minimal facial edema. Respiratory: Supine ventilation with no ventilator dyssynchrony. Fine crackles. Acceptable airway pressures. Cardiovascular: Extremities warm  well perfused minimal edema. Unable to auscultate heart sounds due to prone position. Abdomen: Abdomen soft nontender.  Heparin ecchymoses lower abdomen but no palpable mass Neuro: No neuromuscular blockade. Deeply sedated RASS -4, no response to pain or voice. Extremities: No skin breakdown Central line site intact.  Daily Goals Checklist  Pain/Anxiety/Delirium protocol (if indicated): Titrate midazolam and fentanyl infusions to keep RASS -4 while prone.  Start enteral sedatives VAP protocol (if indicated): Bundle in place Respiratory support goals: Resume prone ventilation, follow PF ratio wean FiO2 as tolerated. Begin PEEP wean once FiO2 at 0.4 Blood pressure target: On no vasopressors. Maintain MAP greater than 65. DVT prophylaxis: Unfractionated heparin for DVT prophylaxis Nutrition Status: Tolerating tube feeds GI prophylaxis: Pantoprazole Fluid status goals: +3 L.  Diurese today Urinary catheter: Guide hemodynamic management Central lines: Left subclavian central line Glucose control: Type 2 diabetes with stress hyperglycemia adequate control at present. Mobility/therapy needs: Bedrest Antibiotic de-escalation: Continue steroid taper. Stop antibiotics if cultures negative at 48 hours Home medication reconciliation: Continue home Synthroid Daily labs: Daily CBC and CMP ordered Code Status: Full code Family Communication: Daughter updated 10/13 Disposition: ICU   Resolved Hospital Problem list    Hyperkalemia- resolved  Assessment & Plan:   Critically ill due to acute hypoxic/hypercapnic respiratory failure and sepsis (POA) due to severe ARDS from COVID19 pneumonia -Continue prone ventilation until tomorrow morning to get supine trials on day schedule. -Continue current low tidal volume ventilation strategy as airway pressures and oxygenation acceptable. Adequate gas exchange.  COVID -69 - unvaccinated. Refused remdesivir -Taper methylprednisolone.   Concern for sepsis  06/01/2020-now off vasopressors -Stop antibiotics if cultures negative at 48 hours  Acute on chronic renal failure - - thought to be ATN on baseline CKD Currently no indications for dialysis -Family is agreeable for HD if needed   ABLA secondary to Rectus sheath hematoma seen on CT 10/6-no signs of active bleeding -Transfuse as necessary  LABS    PULMONARY Recent Labs  Lab 06/01/20 0558 06/01/20 2217 06/03/20 1617 06/03/20 2006 06/04/20 0032  PHART 7.309* 7.381 7.294* 7.198* 7.256*  PCO2ART 53.8* 46.5 58.9* 76.9* 65.9*  PO2ART 124* 81* 60* 106 118*  HCO3 27.2 27.7 28.5* 29.6* 29.4*  TCO2 29 29 30  32 31  O2SAT 98.0 96.0 86.0 96.0 98.0    CBC Recent Labs  Lab 06/03/20 1044 06/03/20 1617 06/04/20 0032 06/04/20 0552 06/04/20 1227  HGB 8.1*   < > 6.8* 6.7* 6.7*  HCT 26.3*   < > 20.0* 21.7* 21.8*  WBC 13.3*  --   --  8.4 9.2  PLT 115*  --   --  95* 88*   < > = values in this interval not displayed.    COAGULATION No results for input(s):  INR in the last 168 hours.  CARDIAC  No results for input(s): TROPONINI in the last 168 hours. No results for input(s): PROBNP in the last 168 hours.   CHEMISTRY Recent Labs  Lab 05/28/20 1812 05/28/20 1812 05/29/20 0348 05/29/20 0430 05/29/20 1644 05/29/20 1820 06/01/20 0243 06/01/20 0558 06/02/20 0450 06/02/20 0450 06/02/20 1050 06/02/20 1050 06/03/20 0429 06/03/20 0429 06/03/20 1617 06/03/20 1617 06/03/20 2006 06/03/20 2006 06/04/20 0032 06/04/20 0552  NA  --   --  144   < > 145   < > 152*   < > 154*   < > 155*   < > 149*  --  147*  --  145  --  144 145  K  --   --  4.4   < > 4.9   < > 5.0   < > 4.7   < > 5.0   < > 5.4*   < > 5.3*   < > 5.6*   < > 6.0* 5.8*  CL  --   --  112*   < > 113*   < > 115*  --  118*  --  120*  --  115*  --   --   --   --   --   --  110  CO2  --   --  18*   < > 20*   < > 26  --  25  --  25  --  25  --   --   --   --   --   --  24  GLUCOSE  --   --  249*   < > 329*   < > 175*  --  107*   --  156*  --  202*  --   --   --   --   --   --  195*  BUN  --   --  90*   < > 89*   < > 118*  --  123*  --  118*  --  115*  --   --   --   --   --   --  113*  CREATININE  --   --  3.66*   < > 3.44*   < > 3.27*  --  2.96*  --  3.02*  --  2.71*  --   --   --   --   --   --  2.64*  CALCIUM  --   --  7.9*   < > 8.0*   < > 8.9  --  8.9  --  8.8*  --  8.7*  --   --   --   --   --   --  8.5*  MG 2.7*  --  2.7*  --  2.8*  --  2.5*  --  2.4  --   --   --   --   --   --   --   --   --   --   --   PHOS 7.9*   < > 7.2*   < > 6.4*  --  4.9*  --  4.0  --   --   --  4.2  --   --   --   --   --   --  5.9*   < > = values in this interval not displayed.   Estimated Creatinine Clearance: 18.9 mL/min (A) (by C-G formula based on  SCr of 2.64 mg/dL (H)).   LIVER Recent Labs  Lab 06/01/20 0243 06/02/20 1050 06/03/20 0429 06/04/20 0552  AST 13* 17 17 17   ALT 17 16 16 17   ALKPHOS 59 53 58 53  BILITOT 0.2* 0.3 0.2* 0.5  PROT 4.8* 5.1* 5.1* 4.8*  ALBUMIN 1.5* 1.4* 1.3* 1.2*     INFECTIOUS Recent Labs  Lab 06/01/20 1138 06/01/20 1139 06/02/20 0450 06/03/20 0429  LATICACIDVEN  --  1.1  --   --   PROCALCITON 0.21  --  0.45 1.74     ENDOCRINE CBG (last 3)  Recent Labs    06/04/20 0351 06/04/20 0741 06/04/20 1218  GLUCAP 218* 162* 127*   CRITICAL CARE Performed by: Kipp Brood   Total critical care time: 40 minutes  Critical care time was exclusive of separately billable procedures and treating other patients.  Critical care was necessary to treat or prevent imminent or life-threatening deterioration.  Critical care was time spent personally by me on the following activities: development of treatment plan with patient and/or surrogate as well as nursing, discussions with consultants, evaluation of patient's response to treatment, examination of patient, obtaining history from patient or surrogate, ordering and performing treatments and interventions, ordering and review of laboratory  studies, ordering and review of radiographic studies, pulse oximetry, re-evaluation of patient's condition and participation in multidisciplinary rounds.  Kipp Brood, MD [facility] ICU Physician [place]  Pager: [phone] Mobile: [phone] After hours: [phone].

## 2020-06-04 NOTE — Progress Notes (Signed)
RT note. Pt. Head turned RT to Lt without any complications. RT will continue to monitor.

## 2020-06-05 ENCOUNTER — Inpatient Hospital Stay (HOSPITAL_COMMUNITY): Payer: Medicare HMO

## 2020-06-05 DIAGNOSIS — J9601 Acute respiratory failure with hypoxia: Secondary | ICD-10-CM | POA: Diagnosis not present

## 2020-06-05 DIAGNOSIS — U071 COVID-19: Secondary | ICD-10-CM | POA: Diagnosis not present

## 2020-06-05 LAB — COMPREHENSIVE METABOLIC PANEL
ALT: 22 U/L (ref 0–44)
AST: 22 U/L (ref 15–41)
Albumin: 1.4 g/dL — ABNORMAL LOW (ref 3.5–5.0)
Alkaline Phosphatase: 60 U/L (ref 38–126)
Anion gap: 11 (ref 5–15)
BUN: 117 mg/dL — ABNORMAL HIGH (ref 8–23)
CO2: 25 mmol/L (ref 22–32)
Calcium: 8.8 mg/dL — ABNORMAL LOW (ref 8.9–10.3)
Chloride: 108 mmol/L (ref 98–111)
Creatinine, Ser: 2.42 mg/dL — ABNORMAL HIGH (ref 0.44–1.00)
GFR, Estimated: 20 mL/min — ABNORMAL LOW (ref >60)
Glucose, Bld: 117 mg/dL — ABNORMAL HIGH (ref 70–99)
Potassium: 5.2 mmol/L — ABNORMAL HIGH (ref 3.5–5.1)
Sodium: 144 mmol/L (ref 135–145)
Total Bilirubin: 0.6 mg/dL (ref 0.3–1.2)
Total Protein: 5.3 g/dL — ABNORMAL LOW (ref 6.5–8.1)

## 2020-06-05 LAB — POCT I-STAT 7, (LYTES, BLD GAS, ICA,H+H)
Acid-Base Excess: 2 mmol/L (ref 0.0–2.0)
Acid-Base Excess: 2 mmol/L (ref 0.0–2.0)
Bicarbonate: 28.1 mmol/L — ABNORMAL HIGH (ref 20.0–28.0)
Bicarbonate: 28.8 mmol/L — ABNORMAL HIGH (ref 20.0–28.0)
Calcium, Ion: 1.32 mmol/L (ref 1.15–1.40)
Calcium, Ion: 1.33 mmol/L (ref 1.15–1.40)
HCT: 25 % — ABNORMAL LOW (ref 36.0–46.0)
HCT: 26 % — ABNORMAL LOW (ref 36.0–46.0)
Hemoglobin: 8.5 g/dL — ABNORMAL LOW (ref 12.0–15.0)
Hemoglobin: 8.8 g/dL — ABNORMAL LOW (ref 12.0–15.0)
O2 Saturation: 72 %
O2 Saturation: 86 %
Patient temperature: 37.4
Patient temperature: 37.4
Potassium: 5.2 mmol/L — ABNORMAL HIGH (ref 3.5–5.1)
Potassium: 5.2 mmol/L — ABNORMAL HIGH (ref 3.5–5.1)
Sodium: 144 mmol/L (ref 135–145)
Sodium: 144 mmol/L (ref 135–145)
TCO2: 30 mmol/L (ref 22–32)
TCO2: 30 mmol/L (ref 22–32)
pCO2 arterial: 55.4 mmHg — ABNORMAL HIGH (ref 32.0–48.0)
pCO2 arterial: 55 mmHg — ABNORMAL HIGH (ref 32.0–48.0)
pH, Arterial: 7.315 — ABNORMAL LOW (ref 7.350–7.450)
pH, Arterial: 7.329 — ABNORMAL LOW (ref 7.350–7.450)
pO2, Arterial: 43 mmHg — ABNORMAL LOW (ref 83.0–108.0)
pO2, Arterial: 58 mmHg — ABNORMAL LOW (ref 83.0–108.0)

## 2020-06-05 LAB — GLUCOSE, CAPILLARY
Glucose-Capillary: 127 mg/dL — ABNORMAL HIGH (ref 70–99)
Glucose-Capillary: 134 mg/dL — ABNORMAL HIGH (ref 70–99)
Glucose-Capillary: 144 mg/dL — ABNORMAL HIGH (ref 70–99)
Glucose-Capillary: 177 mg/dL — ABNORMAL HIGH (ref 70–99)
Glucose-Capillary: 199 mg/dL — ABNORMAL HIGH (ref 70–99)
Glucose-Capillary: 82 mg/dL (ref 70–99)
Glucose-Capillary: 90 mg/dL (ref 70–99)

## 2020-06-05 LAB — CBC WITH DIFFERENTIAL/PLATELET
Abs Immature Granulocytes: 0.14 10*3/uL — ABNORMAL HIGH (ref 0.00–0.07)
Basophils Absolute: 0 10*3/uL (ref 0.0–0.1)
Basophils Relative: 0 %
Eosinophils Absolute: 0.1 10*3/uL (ref 0.0–0.5)
Eosinophils Relative: 1 %
HCT: 27.1 % — ABNORMAL LOW (ref 36.0–46.0)
Hemoglobin: 8.8 g/dL — ABNORMAL LOW (ref 12.0–15.0)
Immature Granulocytes: 2 %
Lymphocytes Relative: 7 %
Lymphs Abs: 0.7 10*3/uL (ref 0.7–4.0)
MCH: 30.7 pg (ref 26.0–34.0)
MCHC: 32.5 g/dL (ref 30.0–36.0)
MCV: 94.4 fL (ref 80.0–100.0)
Monocytes Absolute: 0.2 10*3/uL (ref 0.1–1.0)
Monocytes Relative: 3 %
Neutro Abs: 8.3 10*3/uL — ABNORMAL HIGH (ref 1.7–7.7)
Neutrophils Relative %: 87 %
Platelets: 103 10*3/uL — ABNORMAL LOW (ref 150–400)
RBC: 2.87 MIL/uL — ABNORMAL LOW (ref 3.87–5.11)
RDW: 15.6 % — ABNORMAL HIGH (ref 11.5–15.5)
WBC: 9.4 10*3/uL (ref 4.0–10.5)
nRBC: 0 % (ref 0.0–0.2)

## 2020-06-05 LAB — TYPE AND SCREEN
ABO/RH(D): B POS
Antibody Screen: NEGATIVE
Unit division: 0

## 2020-06-05 LAB — BPAM RBC
Blood Product Expiration Date: 202110202359
ISSUE DATE / TIME: 202110131732
Unit Type and Rh: 7300

## 2020-06-05 MED ORDER — VITAL 1.5 CAL PO LIQD
1000.0000 mL | ORAL | Status: DC
  Administered 2020-06-05 – 2020-06-14 (×9): 1000 mL
  Filled 2020-06-05 (×9): qty 1000

## 2020-06-05 MED ORDER — PROSOURCE TF PO LIQD
90.0000 mL | Freq: Two times a day (BID) | ORAL | Status: DC
  Administered 2020-06-05 – 2020-06-14 (×17): 90 mL
  Filled 2020-06-05 (×18): qty 90

## 2020-06-05 MED ORDER — FUROSEMIDE 10 MG/ML IJ SOLN
100.0000 mg | Freq: Once | INTRAVENOUS | Status: AC
  Administered 2020-06-05: 100 mg via INTRAVENOUS
  Filled 2020-06-05: qty 10

## 2020-06-05 NOTE — Progress Notes (Signed)
Patient placed in prone position. RT X2 RN X4. ETT tube secured with cloth tape and foam pads placed on face. No complications noted.

## 2020-06-05 NOTE — Progress Notes (Signed)
RT note. Turned pt. Head from Lt to Rt without any complications, able to pass cath.

## 2020-06-05 NOTE — Progress Notes (Signed)
NAME:  JUPITER BOYS, MRN:  [id number], DOB:  27-Nov-1950, LOS: 88 ADMISSION DATE:  04/23/2020, CONSULTATION DATE:  10/5 REFERRING MD: Memon/ Triad, CHIEF COMPLAINT:  resp distress    Brief History     44 yowf , with history of T2DM, OSA, Hothyroidism, HTN, HLD, hepatitis, and GERD presents to the ED with a chief complaint of dyspnea.  Patient reports that symptom onset was 5 weeks ago, and started with diverticulitis.  Patient reports that since then she has had dyspnea and cough that started simultaneously.  The dyspnea and cough became worse over the last 2 weeks.  Patient reports that dyspnea is worse with exertion, and she can even walk now.  She reports that she fell on 9/29, which she attributes to being so weak.  She describes the fall as she was sitting in her chair got up took 2 steps and ended up on the ground.  She does not remember feeling lightheaded, or any preceding symptoms, and she did not hit her head.  Patient reports that she is weak because she has had decreased appetite and decreased p.o. intake since the diverticulitis 5 weeks pta.  Patient reports that her taste and smell are intact.  She does not have chest pain or palpitations.  She reports that her cough is a dry cough.  She had fevers for the first 3 weeks of her symptoms, but not for the last 2 weeks.  She reports that her T-max was 103.1, and Tylenol helps when she remembers to take it.  Patient has not had body aches, nausea vomiting, diarrhea, dysuria.  She does admit to shakiness, and vision changes over the last 2 weeks.  She reports that she cannot focus her eyes, so is blurry vision, but not double vision.  She does not have a sore throat and is not has clogged sinuses.  Patient did not get a Covid vaccine, because "I did not want 1."  Patient is refusing remdesivir.  Past Medical History  DM  II OSA Hypothyroid HBP HLD GERD   Significant Hospital Events   05/10/2020 - admit 05/30/20 - Sedated and prone . No acute  issues overnight received on dose of NMB last night and again this AM  10/19 -  05/31/2020: On fentanyl infusion, Versed infusion.  Off Levophed infusion and then back on.  On tube feeds.  On the ventilator FiO2 60%. Peep 12 -> pulse ox 95%. Afebrile.  Supine since 4am. PF ratio 201  Consults:  PCCM  10/5  Procedures:  Oral ET  10/5  L IJ CVL  10/5   Significant Diagnostic Tests:  CT Abd L large rectus sheath hematoma, COVID PNA on visible lung fields  Micro Data:  Covid 19  PCR  9/30  POS MRSA PCR  10/1 >   Neg   Blood 05/27/2020: Negative Tracheal aspirate 05/28/2020: Rare Candida blood culture Blood 06/01/2020 Urine 06/01/2020 Tracheal aspirate 06/01/2020 Procalcitonin 06/01/2020  ID Rx:  Baricitinib 10/3 > 10/5 (stopped following abd pain and rise in LFT) Remdesivir [refused] Solu-Medrol 10/1  > (stop in 14 days total)  Interim history/subjective:   Remains supine yesterday. FiO2 remains elevated  Objective   Blood pressure (!) 134/55, pulse 94, temperature 99.1 F (37.3 C), resp. rate (!) 32, height 5\' 1"  (1.549 m), weight 85.4 kg, SpO2 95 %.    Vent Mode: PRVC FiO2 (%):  [70 %-100 %] 90 % Set Rate:  [35 bmp] 35 bmp Vt Set:  [350 mL]  350 mL PEEP:  [15 cmH20-18 cmH20] 18 cmH20 Plateau Pressure:  [24 cmH20-29 cmH20] 26 cmH20   Intake/Output Summary (Last 24 hours) at 06/05/2020 1117 Last data filed at 06/05/2020 0800 Gross per 24 hour  Intake 3764.44 ml  Output 2795 ml  Net 969.44 ml   Filed Weights   06/03/20 0500 06/04/20 0500 06/05/20 0500  Weight: 75.1 kg 75.1 kg 85.4 kg   Physical exam   General: Obese woman, appears chronically ill. Intubated, sedated in supine position. HEENT: OG tube, ET tube in place with no skin breakdown. Minimal facial edema. Respiratory: Supine ventilation with no ventilator dyssynchrony. Fine crackles. Acceptable airway pressures. Occasional ventilator dyssynchrony Cardiovascular: Extremities warm well perfused minimal edema.  Unable to auscultate heart sounds due to prone position. Abdomen: Abdomen soft nontender.  Heparin ecchymoses lower abdomen but no palpable mass Neuro: No neuromuscular blockade. Deeply sedated RASS -4, no response to pain or voice. Extremities: No skin breakdown Central line site intact.  Daily Goals Checklist  Pain/Anxiety/Delirium protocol (if indicated): Titrate midazolam and fentanyl infusions to keep RASS -4 while prone.  Start enteral sedatives VAP protocol (if indicated): Bundle in place Respiratory support goals: Resume prone ventilation, follow PF ratio wean FiO2 as tolerated. Begin PEEP wean once FiO2 at 0.4 Blood pressure target: On no vasopressors. Maintain MAP greater than 65. DVT prophylaxis: Unfractionated heparin for DVT prophylaxis Nutrition Status: Tolerating tube feeds GI prophylaxis: Pantoprazole Fluid status goals: +3 L.  Diurese today again Urinary catheter: Guide hemodynamic management Central lines: Left subclavian central line Glucose control: Type 2 diabetes with stress hyperglycemia adequate control at present. Mobility/therapy needs: Bedrest Antibiotic de-escalation: Continue steroid taper. Stop antibiotics if cultures negative at 48 hours Home medication reconciliation: Continue home Synthroid Daily labs: Daily CBC and CMP ordered Code Status: Full code Family Communication: Daughter updated 10/13 Disposition: ICU  Resolved Hospital Problem list    Hyperkalemia- resolved  Assessment & Plan:   Critically ill due to acute hypoxic/hypercapnic respiratory failure and sepsis (POA) due to severe ARDS from COVID19 pneumonia -Continue prone ventilation until tomorrow morning to get supine trials on day schedule. -Continue current low tidal volume ventilation strategy as airway pressures and oxygenation acceptable. Gas exchange somewhat worse. -Attempt diuresis today  COVID -19 - unvaccinated. Refused remdesivir -Taper methylprednisolone.   Concern for  sepsis 06/01/2020-now off vasopressors -Stop antibiotics if cultures negative at 48 hours  Acute on chronic renal failure - - thought to be ATN on baseline CKD Currently no indications for dialysis -Family is agreeable for HD if needed   ABLA secondary to Rectus sheath hematoma seen on CT 10/6-no signs of active bleeding -Transfuse as necessary  LABS    PULMONARY Recent Labs  Lab 06/03/20 1617 06/03/20 2006 06/04/20 0032 06/05/20 0921 06/05/20 0937  PHART 7.294* 7.198* 7.256* 7.315* 7.329*  PCO2ART 58.9* 76.9* 65.9* 55.4* 55.0*  PO2ART 60* 106 118* 43* 58*  HCO3 28.5* 29.6* 29.4* 28.1* 28.8*  TCO2 30 32 31 30 30   O2SAT 86.0 96.0 98.0 72.0 86.0    CBC Recent Labs  Lab 06/04/20 0552 06/04/20 0552 06/04/20 1227 06/04/20 1227 06/05/20 0236 06/05/20 0921 06/05/20 0937  HGB 6.7*   < > 6.7*   < > 8.8* 8.5* 8.8*  HCT 21.7*   < > 21.8*   < > 27.1* 25.0* 26.0*  WBC 8.4  --  9.2  --  9.4  --   --   PLT 95*  --  88*  --  103*  --   --    < > =  values in this interval not displayed.    COAGULATION No results for input(s): INR in the last 168 hours.  CARDIAC  No results for input(s): TROPONINI in the last 168 hours. No results for input(s): PROBNP in the last 168 hours.   CHEMISTRY Recent Labs  Lab 05/29/20 1644 05/29/20 1820 06/01/20 0243 06/01/20 0558 06/02/20 0450 06/02/20 0450 06/02/20 1050 06/02/20 1050 06/03/20 0429 06/03/20 1617 06/04/20 0032 06/04/20 0032 06/04/20 0552 06/04/20 0552 06/05/20 0236 06/05/20 0236 06/05/20 0921 06/05/20 0937  NA 145   < > 152*   < > 154*   < > 155*   < > 149*   < > 144  --  145  --  144  --  144 144  K 4.9   < > 5.0   < > 4.7   < > 5.0   < > 5.4*   < > 6.0*   < > 5.8*   < > 5.2*   < > 5.2* 5.2*  CL 113*   < > 115*   < > 118*  --  120*  --  115*  --   --   --  110  --  108  --   --   --   CO2 20*   < > 26   < > 25  --  25  --  25  --   --   --  24  --  25  --   --   --   GLUCOSE 329*   < > 175*   < > 107*  --  156*   --  202*  --   --   --  195*  --  117*  --   --   --   BUN 89*   < > 118*   < > 123*  --  118*  --  115*  --   --   --  113*  --  117*  --   --   --   CREATININE 3.44*   < > 3.27*   < > 2.96*  --  3.02*  --  2.71*  --   --   --  2.64*  --  2.42*  --   --   --   CALCIUM 8.0*   < > 8.9   < > 8.9  --  8.8*  --  8.7*  --   --   --  8.5*  --  8.8*  --   --   --   MG 2.8*  --  2.5*  --  2.4  --   --   --   --   --   --   --   --   --   --   --   --   --   PHOS 6.4*  --  4.9*  --  4.0  --   --   --  4.2  --   --   --  5.9*  --   --   --   --   --    < > = values in this interval not displayed.   Estimated Creatinine Clearance: 22.1 mL/min (A) (by C-G formula based on SCr of 2.42 mg/dL (H)).   LIVER Recent Labs  Lab 06/01/20 0243 06/02/20 1050 06/03/20 0429 06/04/20 0552 06/05/20 0236  AST 13* 17 17 17 22   ALT 17 16 16 17 22   ALKPHOS 59 53 58 53 60  BILITOT 0.2* 0.3 0.2* 0.5 0.6  PROT 4.8* 5.1* 5.1* 4.8* 5.3*  ALBUMIN 1.5* 1.4* 1.3* 1.2* 1.4*     INFECTIOUS Recent Labs  Lab 06/01/20 1138 06/01/20 1139 06/02/20 0450 06/03/20 0429  LATICACIDVEN  --  1.1  --   --   PROCALCITON 0.21  --  0.45 1.74     ENDOCRINE CBG (last 3)  Recent Labs    06/05/20 0328 06/05/20 0749 06/05/20 1058  GLUCAP 90 82 127*   CRITICAL CARE Performed by: Kipp Brood   Total critical care time: 40 minutes  Critical care time was exclusive of separately billable procedures and treating other patients.  Critical care was necessary to treat or prevent imminent or life-threatening deterioration.  Critical care was time spent personally by me on the following activities: development of treatment plan with patient and/or surrogate as well as nursing, discussions with consultants, evaluation of patient's response to treatment, examination of patient, obtaining history from patient or surrogate, ordering and performing treatments and interventions, ordering and review of laboratory studies, ordering  and review of radiographic studies, pulse oximetry, re-evaluation of patient's condition and participation in multidisciplinary rounds.  Kipp Brood, MD [facility] ICU Physician [place]  Pager: [phone] Mobile: [phone] After hours: [phone].

## 2020-06-05 NOTE — Progress Notes (Signed)
RT note. Pt. Head turned from RT to LT without any complications, able to pass cath. 

## 2020-06-05 NOTE — Progress Notes (Signed)
Nutrition Follow-up   DOCUMENTATION CODES:   Not applicable  INTERVENTION:   Tube feeding via OG tube: Vital 1.5 @ 60 ml/h (1440 ml per day) Prosource TF 90 ml BID  Provides 2320 kcal, 141 gm protein, 1100 ml free water daily  NUTRITION DIAGNOSIS:   Inadequate oral intake related to inability to eat as evidenced by NPO status.  Ongoing   GOAL:   Patient will meet greater than or equal to 90% of their needs  Met.   MONITOR:   Weight trends, Vent status, Labs, I & O's, TF tolerance  REASON FOR ASSESSMENT:   Consult Enteral/tube feeding initiation and management  ASSESSMENT:   69 yo female admitted to [facility] on 9/30 with 2 week hx of worsening dyspnea, cough, weakness, and decreased appetite. COVID positive. Intubated 10/5 and transferred to [facility] for treatment of COVID PNA. PMH includes DM-2, glomerulonephritis, OSA, hypothyroidism, HLD, hepatitis, GERD, diverticulitis.  10/8 prone  Rectus hematoma causing GI bleeding.  Free water flushes 200 ml every 2 hours.  Patient is currently intubated on ventilator support. MV: 12.7 L/min Temp (24hrs), Avg:98.5 F (36.9 C), Min:97.5 F (36.4 C), Max:99.3 F (37.4 C)   Labs reviewed. Na 144, K+ 5.2  CBG: 127-199  Medications reviewed and include senokot, colace, novolog 5 units every 4 hours, , levemir 20 units BID, tradjenta, sodium bicarb tablet, miralax. Fentanyl Versed   Admission weight 78.9 kg Current weight 85.4 kg I/O +11 L UOP 2,765 ml x 24 h  Current TF:  Vital AF 1.2 @ 65 ml/hr Provides: 1872 kcal and 117 grams protein  Diet Order:   Diet Order            Diet NPO time specified  Diet effective now                 EDUCATION NEEDS:   No education needs have been identified at this time  Skin:  Skin Assessment: Reviewed RN Assessment  Last BM:  250 ml via rectal tube  Height:   Ht Readings from Last 1 Encounters:  05/30/20 _0  (1.549 m)    Weight:   Wt Readings from Last 1  Encounters:  06/05/20 85.4 kg    Ideal Body Weight:  47.7 kg  BMI:  Body mass index is 35.57 kg/m.  Estimated Nutritional Needs:   Kcal:  1820-2450  Protein:  105-140 gm  Fluid:  >/= 2 L/day   Lockie Pares., RD, LDN, CNSC See AMiON for contact information

## 2020-06-06 DIAGNOSIS — J9601 Acute respiratory failure with hypoxia: Secondary | ICD-10-CM | POA: Diagnosis not present

## 2020-06-06 DIAGNOSIS — U071 COVID-19: Secondary | ICD-10-CM | POA: Diagnosis not present

## 2020-06-06 LAB — CULTURE, BLOOD (ROUTINE X 2)
Culture: NO GROWTH
Culture: NO GROWTH

## 2020-06-06 LAB — COMPREHENSIVE METABOLIC PANEL
ALT: 20 U/L (ref 0–44)
AST: 17 U/L (ref 15–41)
Albumin: 1.4 g/dL — ABNORMAL LOW (ref 3.5–5.0)
Alkaline Phosphatase: 53 U/L (ref 38–126)
Anion gap: 11 (ref 5–15)
BUN: 111 mg/dL — ABNORMAL HIGH (ref 8–23)
CO2: 27 mmol/L (ref 22–32)
Calcium: 9.1 mg/dL (ref 8.9–10.3)
Chloride: 107 mmol/L (ref 98–111)
Creatinine, Ser: 2.08 mg/dL — ABNORMAL HIGH (ref 0.44–1.00)
GFR, Estimated: 24 mL/min — ABNORMAL LOW (ref >60)
Glucose, Bld: 158 mg/dL — ABNORMAL HIGH (ref 70–99)
Potassium: 5.4 mmol/L — ABNORMAL HIGH (ref 3.5–5.1)
Sodium: 145 mmol/L (ref 135–145)
Total Bilirubin: 0.6 mg/dL (ref 0.3–1.2)
Total Protein: 5.3 g/dL — ABNORMAL LOW (ref 6.5–8.1)

## 2020-06-06 LAB — CBC WITH DIFFERENTIAL/PLATELET
Abs Immature Granulocytes: 0.08 10*3/uL — ABNORMAL HIGH (ref 0.00–0.07)
Basophils Absolute: 0 10*3/uL (ref 0.0–0.1)
Basophils Relative: 0 %
Eosinophils Absolute: 0.1 10*3/uL (ref 0.0–0.5)
Eosinophils Relative: 1 %
HCT: 29 % — ABNORMAL LOW (ref 36.0–46.0)
Hemoglobin: 9 g/dL — ABNORMAL LOW (ref 12.0–15.0)
Immature Granulocytes: 1 %
Lymphocytes Relative: 11 %
Lymphs Abs: 0.9 10*3/uL (ref 0.7–4.0)
MCH: 30.1 pg (ref 26.0–34.0)
MCHC: 31 g/dL (ref 30.0–36.0)
MCV: 97 fL (ref 80.0–100.0)
Monocytes Absolute: 0.3 10*3/uL (ref 0.1–1.0)
Monocytes Relative: 4 %
Neutro Abs: 6.5 10*3/uL (ref 1.7–7.7)
Neutrophils Relative %: 83 %
Platelets: 113 10*3/uL — ABNORMAL LOW (ref 150–400)
RBC: 2.99 MIL/uL — ABNORMAL LOW (ref 3.87–5.11)
RDW: 15.9 % — ABNORMAL HIGH (ref 11.5–15.5)
WBC: 7.9 10*3/uL (ref 4.0–10.5)
nRBC: 0 % (ref 0.0–0.2)

## 2020-06-06 LAB — GLUCOSE, CAPILLARY
Glucose-Capillary: 134 mg/dL — ABNORMAL HIGH (ref 70–99)
Glucose-Capillary: 140 mg/dL — ABNORMAL HIGH (ref 70–99)
Glucose-Capillary: 181 mg/dL — ABNORMAL HIGH (ref 70–99)
Glucose-Capillary: 192 mg/dL — ABNORMAL HIGH (ref 70–99)
Glucose-Capillary: 203 mg/dL — ABNORMAL HIGH (ref 70–99)
Glucose-Capillary: 209 mg/dL — ABNORMAL HIGH (ref 70–99)

## 2020-06-06 LAB — POCT I-STAT 7, (LYTES, BLD GAS, ICA,H+H)
Acid-Base Excess: 3 mmol/L — ABNORMAL HIGH (ref 0.0–2.0)
Bicarbonate: 29.4 mmol/L — ABNORMAL HIGH (ref 20.0–28.0)
Calcium, Ion: 1.33 mmol/L (ref 1.15–1.40)
HCT: 25 % — ABNORMAL LOW (ref 36.0–46.0)
Hemoglobin: 8.5 g/dL — ABNORMAL LOW (ref 12.0–15.0)
O2 Saturation: 83 %
Patient temperature: 36.7
Potassium: 5.1 mmol/L (ref 3.5–5.1)
Sodium: 142 mmol/L (ref 135–145)
TCO2: 31 mmol/L (ref 22–32)
pCO2 arterial: 52.1 mmHg — ABNORMAL HIGH (ref 32.0–48.0)
pH, Arterial: 7.359 (ref 7.350–7.450)
pO2, Arterial: 49 mmHg — ABNORMAL LOW (ref 83.0–108.0)

## 2020-06-06 MED ORDER — FUROSEMIDE 10 MG/ML IJ SOLN
80.0000 mg | Freq: Two times a day (BID) | INTRAMUSCULAR | Status: AC
  Administered 2020-06-06 – 2020-06-08 (×6): 80 mg via INTRAVENOUS
  Filled 2020-06-06 (×7): qty 8

## 2020-06-06 MED ORDER — SODIUM ZIRCONIUM CYCLOSILICATE 10 G PO PACK
10.0000 g | PACK | Freq: Once | ORAL | Status: AC
  Administered 2020-06-06: 10 g
  Filled 2020-06-06: qty 1

## 2020-06-06 NOTE — Progress Notes (Signed)
Patients head turned to the right side. No complications at this time.

## 2020-06-06 NOTE — Progress Notes (Signed)
RT x2 re-taped ETT. RT and RNx5 turned patient to prone position. No complications. RT will continue to monitor.

## 2020-06-06 NOTE — Progress Notes (Signed)
NAME:  [name], MRN:  [id number], DOB:  May 22, 1951, LOS: 20 ADMISSION DATE:  04/28/2020, CONSULTATION DATE:  10/5 REFERRING MD: Memon/ Triad, CHIEF COMPLAINT:  resp distress    Brief History     26 yowf , with history of T2DM, OSA, Hothyroidism, HTN, HLD, hepatitis, and GERD presents to the ED with a chief complaint of dyspnea.  Patient reports that symptom onset was 5 weeks ago, and started with diverticulitis.  Patient reports that since then she has had dyspnea and cough that started simultaneously.  The dyspnea and cough became worse over the last 2 weeks.  Patient reports that dyspnea is worse with exertion, and she can even walk now.  She reports that she fell on 9/29, which she attributes to being so weak.  She describes the fall as she was sitting in her chair got up took 2 steps and ended up on the ground.  She does not remember feeling lightheaded, or any preceding symptoms, and she did not hit her head.  Patient reports that she is weak because she has had decreased appetite and decreased p.o. intake since the diverticulitis 5 weeks pta.  Patient reports that her taste and smell are intact.  She does not have chest pain or palpitations.  She reports that her cough is a dry cough.  She had fevers for the first 3 weeks of her symptoms, but not for the last 2 weeks.  She reports that her T-max was 103.1, and Tylenol helps when she remembers to take it.  Patient has not had body aches, nausea vomiting, diarrhea, dysuria.  She does admit to shakiness, and vision changes over the last 2 weeks.  She reports that she cannot focus her eyes, so is blurry vision, but not double vision.  She does not have a sore throat and is not has clogged sinuses.  Patient did not get a Covid vaccine, because "I did not want 1."  Patient is refusing remdesivir.  Past Medical History  DM  II OSA Hypothyroid HBP HLD GERD   Significant Hospital Events   05/01/2020 - admit 05/30/20 - Sedated and prone . No acute  issues overnight received on dose of NMB last night and again this AM  10/19 -  05/31/2020: On fentanyl infusion, Versed infusion.  Off Levophed infusion and then back on.  On tube feeds.  On the ventilator FiO2 60%. Peep 12 -> pulse ox 95%. Afebrile.  Supine since 4am. PF ratio 201  Consults:  PCCM  10/5  Procedures:  Oral ET  10/5  L IJ CVL  10/5   Significant Diagnostic Tests:  CT Abd L large rectus sheath hematoma, COVID PNA on visible lung fields  Micro Data:  Covid 19  PCR  9/30  POS MRSA PCR  10/1 >   Neg   Blood 05/27/2020: Negative Tracheal aspirate 05/28/2020: Rare Candida blood culture Blood 06/01/2020 Urine 06/01/2020 Tracheal aspirate 06/01/2020 Procalcitonin 06/01/2020  ID Rx:  Baricitinib 10/3 > 10/5 (stopped following abd pain and rise in LFT) Remdesivir [refused] Solu-Medrol 10/1  > (stop in 14 days total)  Interim history/subjective:   Continues to have much better oxygenation prone than supine.  Transitioned again today to supine ventilation with worsening FiO2.  Continues to be tachypneic overbreathing set rate.  Objective   Blood pressure (!) 156/69, pulse 89, temperature 97.7 F (36.5 C), resp. rate (!) 47, height 5\' 1"  (1.549 m), weight 83.7 kg, SpO2 (!) 85 %.    Vent  Mode: Bi-Vent FiO2 (%):  [50 %-90 %] 70 % Set Rate:  [35 bmp] 35 bmp Vt Set:  [350 mL] 350 mL PEEP:  [0 cmH20-18 cmH20] 0 cmH20 Plateau Pressure:  [32 cmH20-34 cmH20] 33 cmH20   Intake/Output Summary (Last 24 hours) at 06/06/2020 1052 Last data filed at 06/06/2020 0930 Gross per 24 hour  Intake 3843.16 ml  Output 4645 ml  Net -801.84 ml   Filed Weights   06/04/20 0500 06/05/20 0500 06/06/20 0402  Weight: 75.1 kg 85.4 kg 83.7 kg   Physical exam   General: Obese woman, appears chronically ill. Intubated, sedated in supine position. HEENT: OG tube, ET tube in place with no skin breakdown. Minimal facial edema. Respiratory: Supine ventilation with tachypnea and occasional  ventilator dyssynchrony. Fine crackles. Acceptable airway pressures. Cardiovascular: Extremities warm well perfused moderate edema.  Heart sounds are unremarkable abdomen: Abdomen soft nontender.  Heparin ecchymoses lower abdomen but no palpable mass Neuro: No neuromuscular blockade. Deeply sedated RASS -4, no response to pain or voice. Extremities: No skin breakdown Central line site intact.  Daily Goals Checklist  Pain/Anxiety/Delirium protocol (if indicated): Titrate midazolam and fentanyl infusions to keep RASS -4 while prone.  Continue enteral sedatives VAP protocol (if indicated): Bundle in place Respiratory support goals: Switched to APRV resume prone ventilation, follow PF ratio wean FiO2 as tolerated.  Blood pressure target: On no vasopressors. Maintain MAP greater than 65. DVT prophylaxis: Unfractionated heparin for DVT prophylaxis Nutrition Status: Tolerating tube feeds GI prophylaxis: Pantoprazole Fluid status goals: +3 L.  Diurese today again Urinary catheter: Guide hemodynamic management Central lines: Left subclavian central line Glucose control: Type 2 diabetes with stress hyperglycemia adequate control at present. Mobility/therapy needs: Bedrest Antibiotic de-escalation: Continue steroid taper. Stop antibiotics if cultures negative at 48 hours Home medication reconciliation: Continue home Synthroid Daily labs: Daily CBC and CMP ordered Code Status: Full code Family Communication: Daughter updated 10/13 Disposition: ICU  Resolved Hospital Problem list    Hyperkalemia- resolved  Assessment & Plan:   Critically ill due to acute hypoxic/hypercapnic respiratory failure and sepsis (POA) due to severe ARDS from COVID19 pneumonia -Switch to APRV and resume prone ventilation after 4-hour facial rest.  Hope is that change in ventilator strategy might recruit remaining dependent lung in prone position. -Attempt diuresis today  COVID -19 - unvaccinated. Refused  remdesivir -Taper methylprednisolone.   Concern for sepsis 06/01/2020-now off vasopressors -Stop antibiotics as cultures negative  Acute on chronic renal failure  - thought to be ATN on baseline CKD Currently no indications for dialysis -Family is agreeable for HD if needed  -Continue to at attempt diuresis  ABLA secondary to Rectus sheath hematoma seen on CT 10/6-no signs of active bleeding -Transfuse as necessary -currently stable.  LABS    PULMONARY Recent Labs  Lab 06/03/20 1617 06/03/20 2006 06/04/20 0032 06/05/20 0921 06/05/20 0937  PHART 7.294* 7.198* 7.256* 7.315* 7.329*  PCO2ART 58.9* 76.9* 65.9* 55.4* 55.0*  PO2ART 60* 106 118* 43* 58*  HCO3 28.5* 29.6* 29.4* 28.1* 28.8*  TCO2 30 32 31 30 30   O2SAT 86.0 96.0 98.0 72.0 86.0    CBC Recent Labs  Lab 06/04/20 1227 06/04/20 1227 06/05/20 0236 06/05/20 0236 06/05/20 0921 06/05/20 0937 06/06/20 0405  HGB 6.7*   < > 8.8*   < > 8.5* 8.8* 9.0*  HCT 21.8*   < > 27.1*   < > 25.0* 26.0* 29.0*  WBC 9.2  --  9.4  --   --   --  7.9  PLT 88*  --  103*  --   --   --  113*   < > = values in this interval not displayed.    COAGULATION No results for input(s): INR in the last 168 hours.  CARDIAC  No results for input(s): TROPONINI in the last 168 hours. No results for input(s): PROBNP in the last 168 hours.   CHEMISTRY Recent Labs  Lab 06/01/20 0243 06/01/20 0558 06/02/20 0450 06/02/20 0450 06/02/20 1050 06/02/20 1050 06/03/20 0429 06/03/20 1617 06/04/20 0552 06/04/20 0552 06/05/20 0236 06/05/20 0236 06/05/20 0921 06/05/20 0921 06/05/20 0937 06/06/20 0405  NA 152*   < > 154*   < > 155*   < > 149*   < > 145  --  144  --  144  --  144 145  K 5.0   < > 4.7   < > 5.0   < > 5.4*   < > 5.8*   < > 5.2*   < > 5.2*   < > 5.2* 5.4*  CL 115*   < > 118*   < > 120*  --  115*  --  110  --  108  --   --   --   --  107  CO2 26   < > 25   < > 25  --  25  --  24  --  25  --   --   --   --  27  GLUCOSE 175*   < >  107*   < > 156*  --  202*  --  195*  --  117*  --   --   --   --  158*  BUN 118*   < > 123*   < > 118*  --  115*  --  113*  --  117*  --   --   --   --  111*  CREATININE 3.27*   < > 2.96*   < > 3.02*  --  2.71*  --  2.64*  --  2.42*  --   --   --   --  2.08*  CALCIUM 8.9   < > 8.9   < > 8.8*  --  8.7*  --  8.5*  --  8.8*  --   --   --   --  9.1  MG 2.5*  --  2.4  --   --   --   --   --   --   --   --   --   --   --   --   --   PHOS 4.9*  --  4.0  --   --   --  4.2  --  5.9*  --   --   --   --   --   --   --    < > = values in this interval not displayed.   Estimated Creatinine Clearance: 25.4 mL/min (A) (by C-G formula based on SCr of 2.08 mg/dL (H)).   LIVER Recent Labs  Lab 06/02/20 1050 06/03/20 0429 06/04/20 0552 06/05/20 0236 06/06/20 0405  AST 17 17 17 22 17   ALT 16 16 17 22 20   ALKPHOS 53 58 53 60 53  BILITOT 0.3 0.2* 0.5 0.6 0.6  PROT 5.1* 5.1* 4.8* 5.3* 5.3*  ALBUMIN 1.4* 1.3* 1.2* 1.4* 1.4*     INFECTIOUS Recent Labs  Lab 06/01/20 1138 06/01/20 1139 06/02/20 0450  06/03/20 0429  LATICACIDVEN  --  1.1  --   --   PROCALCITON 0.21  --  0.45 1.74     ENDOCRINE CBG (last 3)  Recent Labs    06/05/20 2331 06/06/20 0339 06/06/20 0741  GLUCAP 144* 140* 134*   CRITICAL CARE Performed by: Kipp Brood   Total critical care time: 40 minutes  Critical care time was exclusive of separately billable procedures and treating other patients.  Critical care was necessary to treat or prevent imminent or life-threatening deterioration.  Critical care was time spent personally by me on the following activities: development of treatment plan with patient and/or surrogate as well as nursing, discussions with consultants, evaluation of patient's response to treatment, examination of patient, obtaining history from patient or surrogate, ordering and performing treatments and interventions, ordering and review of laboratory studies, ordering and review of radiographic  studies, pulse oximetry, re-evaluation of patient's condition and participation in multidisciplinary rounds.  Kipp Brood, MD [facility] ICU Physician [place]  Pager: [phone] Mobile: [phone] After hours: [phone].

## 2020-06-06 NOTE — Progress Notes (Signed)
Patient placed back in supine position. RT and RNx4 assist. No skin breakdown noted. No complications at this time.  

## 2020-06-06 NOTE — Progress Notes (Signed)
RT note. Pt. Head turned from LT to RT without any complications, able to pass cath 

## 2020-06-07 ENCOUNTER — Inpatient Hospital Stay (HOSPITAL_COMMUNITY): Payer: Medicare HMO

## 2020-06-07 DIAGNOSIS — U071 COVID-19: Secondary | ICD-10-CM | POA: Diagnosis not present

## 2020-06-07 DIAGNOSIS — J9601 Acute respiratory failure with hypoxia: Secondary | ICD-10-CM | POA: Diagnosis not present

## 2020-06-07 LAB — COMPREHENSIVE METABOLIC PANEL
ALT: 20 U/L (ref 0–44)
AST: 15 U/L (ref 15–41)
Albumin: 1.3 g/dL — ABNORMAL LOW (ref 3.5–5.0)
Alkaline Phosphatase: 59 U/L (ref 38–126)
Anion gap: 10 (ref 5–15)
BUN: 109 mg/dL — ABNORMAL HIGH (ref 8–23)
CO2: 28 mmol/L (ref 22–32)
Calcium: 8.8 mg/dL — ABNORMAL LOW (ref 8.9–10.3)
Chloride: 105 mmol/L (ref 98–111)
Creatinine, Ser: 1.9 mg/dL — ABNORMAL HIGH (ref 0.44–1.00)
GFR, Estimated: 27 mL/min — ABNORMAL LOW (ref >60)
Glucose, Bld: 227 mg/dL — ABNORMAL HIGH (ref 70–99)
Potassium: 5.1 mmol/L (ref 3.5–5.1)
Sodium: 143 mmol/L (ref 135–145)
Total Bilirubin: 0.5 mg/dL (ref 0.3–1.2)
Total Protein: 5.1 g/dL — ABNORMAL LOW (ref 6.5–8.1)

## 2020-06-07 LAB — POCT I-STAT 7, (LYTES, BLD GAS, ICA,H+H)
Acid-Base Excess: 3 mmol/L — ABNORMAL HIGH (ref 0.0–2.0)
Bicarbonate: 31.6 mmol/L — ABNORMAL HIGH (ref 20.0–28.0)
Calcium, Ion: 1.27 mmol/L (ref 1.15–1.40)
HCT: 38 % (ref 36.0–46.0)
Hemoglobin: 12.9 g/dL (ref 12.0–15.0)
O2 Saturation: 87 %
Potassium: 4.8 mmol/L (ref 3.5–5.1)
Sodium: 142 mmol/L (ref 135–145)
TCO2: 34 mmol/L — ABNORMAL HIGH (ref 22–32)
pCO2 arterial: 67.3 mmHg (ref 32.0–48.0)
pH, Arterial: 7.279 — ABNORMAL LOW (ref 7.350–7.450)
pO2, Arterial: 61 mmHg — ABNORMAL LOW (ref 83.0–108.0)

## 2020-06-07 LAB — CBC WITH DIFFERENTIAL/PLATELET
Abs Immature Granulocytes: 0.04 10*3/uL (ref 0.00–0.07)
Basophils Absolute: 0 10*3/uL (ref 0.0–0.1)
Basophils Relative: 0 %
Eosinophils Absolute: 0.1 10*3/uL (ref 0.0–0.5)
Eosinophils Relative: 2 %
HCT: 26.9 % — ABNORMAL LOW (ref 36.0–46.0)
Hemoglobin: 8 g/dL — ABNORMAL LOW (ref 12.0–15.0)
Immature Granulocytes: 1 %
Lymphocytes Relative: 11 %
Lymphs Abs: 0.6 10*3/uL — ABNORMAL LOW (ref 0.7–4.0)
MCH: 29.4 pg (ref 26.0–34.0)
MCHC: 29.7 g/dL — ABNORMAL LOW (ref 30.0–36.0)
MCV: 98.9 fL (ref 80.0–100.0)
Monocytes Absolute: 0.1 10*3/uL (ref 0.1–1.0)
Monocytes Relative: 2 %
Neutro Abs: 4.6 10*3/uL (ref 1.7–7.7)
Neutrophils Relative %: 84 %
Platelets: 99 10*3/uL — ABNORMAL LOW (ref 150–400)
RBC: 2.72 MIL/uL — ABNORMAL LOW (ref 3.87–5.11)
RDW: 15.5 % (ref 11.5–15.5)
WBC: 5.4 10*3/uL (ref 4.0–10.5)
nRBC: 0 % (ref 0.0–0.2)

## 2020-06-07 LAB — GLUCOSE, CAPILLARY
Glucose-Capillary: 211 mg/dL — ABNORMAL HIGH (ref 70–99)
Glucose-Capillary: 201 mg/dL — ABNORMAL HIGH (ref 70–99)
Glucose-Capillary: 232 mg/dL — ABNORMAL HIGH (ref 70–99)
Glucose-Capillary: 228 mg/dL — ABNORMAL HIGH (ref 70–99)
Glucose-Capillary: 182 mg/dL — ABNORMAL HIGH (ref 70–99)

## 2020-06-07 NOTE — Progress Notes (Signed)
Pt proned at this time with no complications. Et secured in proper position with cloth tape. VS within normal limits

## 2020-06-07 NOTE — Progress Notes (Signed)
NAME:  [name], MRN:  [id number], DOB:  06/26/1951, LOS: 60 ADMISSION DATE:  05/06/2020, CONSULTATION DATE:  10/5 REFERRING MD: Memon/ Triad, CHIEF COMPLAINT:  resp distress    Brief History     89 yowf , with history of T2DM, OSA, Hothyroidism, HTN, HLD, hepatitis, and GERD presents to the ED with a chief complaint of dyspnea.  Patient reports that symptom onset was 5 weeks ago, and started with diverticulitis.  Patient reports that since then she has had dyspnea and cough that started simultaneously.  The dyspnea and cough became worse over the last 2 weeks.  Patient reports that dyspnea is worse with exertion, and she can even walk now.  She reports that she fell on 9/29, which she attributes to being so weak.  She describes the fall as she was sitting in her chair got up took 2 steps and ended up on the ground.  She does not remember feeling lightheaded, or any preceding symptoms, and she did not hit her head.  Patient reports that she is weak because she has had decreased appetite and decreased p.o. intake since the diverticulitis 5 weeks pta.  Patient reports that her taste and smell are intact.  She does not have chest pain or palpitations.  She reports that her cough is a dry cough.  She had fevers for the first 3 weeks of her symptoms, but not for the last 2 weeks.  She reports that her T-max was 103.1, and Tylenol helps when she remembers to take it.  Patient has not had body aches, nausea vomiting, diarrhea, dysuria.  She does admit to shakiness, and vision changes over the last 2 weeks.  She reports that she cannot focus her eyes, so is blurry vision, but not double vision.  She does not have a sore throat and is not has clogged sinuses.  Patient did not get a Covid vaccine, because "I did not want 1."  Patient is refusing remdesivir.  Past Medical History  DM  II OSA Hypothyroid HBP HLD GERD   Significant Hospital Events   05/18/2020 - admit 05/30/20 - Sedated and prone . No acute  issues overnight received on dose of NMB last night and again this AM  10/19 -  05/31/2020: On fentanyl infusion, Versed infusion.  Off Levophed infusion and then back on.  On tube feeds.  On the ventilator FiO2 60%. Peep 12 -> pulse ox 95%. Afebrile.  Supine since 4am. PF ratio 201  Consults:  PCCM  10/5  Procedures:  Oral ET  10/5  L IJ CVL  10/5   Significant Diagnostic Tests:  CT Abd L large rectus sheath hematoma, COVID PNA on visible lung fields  Micro Data:  Covid 19  PCR  9/30  POS MRSA PCR  10/1 >   Neg   Blood 05/27/2020: Negative Tracheal aspirate 05/28/2020: Rare Candida blood culture Blood 06/01/2020 Urine 06/01/2020 Tracheal aspirate 06/01/2020 Procalcitonin 06/01/2020  ID Rx:  Baricitinib 10/3 > 10/5 (stopped following abd pain and rise in LFT) Remdesivir [refused] Solu-Medrol 10/1  > (stop in 14 days total)  Interim history/subjective:   Continues to have much better oxygenation prone than supine.  However more settled even supine with less tachypnea  Objective   Blood pressure (!) 119/53, pulse 97, temperature 98.6 F (37 C), resp. rate (!) 35, height 5\' 1"  (1.549 m), weight 83.5 kg, SpO2 92 %.    Vent Mode: PRVC FiO2 (%):  [40 %-80 %] 70 %  Set Rate:  [35 bmp] 35 bmp Vt Set:  [350 mL] 350 mL PEEP:  [18 cmH20] 18 cmH20 Plateau Pressure:  [32 cmH20] 32 cmH20   Intake/Output Summary (Last 24 hours) at 06/07/2020 1404 Last data filed at 06/07/2020 1300 Gross per 24 hour  Intake 3468.75 ml  Output 3005 ml  Net 463.75 ml   Filed Weights   06/05/20 0500 06/06/20 0402 06/07/20 0405  Weight: 85.4 kg 83.7 kg 83.5 kg   Physical exam   General: Obese woman, appears chronically ill. Intubated, sedated in supine position. HEENT: OG tube, ET tube in place with no skin breakdown. Minimal facial edema. Respiratory: Supine ventilation with tachypnea and occasional ventilator dyssynchrony. Fine crackles. Acceptable airway pressures, PF ratio less than  100 Cardiovascular: Extremities warm well perfused moderate edema.  Heart sounds are unremarkable abdomen: Abdomen soft nontender.  Heparin ecchymoses lower abdomen but no palpable mass Neuro: No neuromuscular blockade. Deeply sedated RASS -4, no response to pain or voice. Extremities: No skin breakdown Central line site intact.  Daily Goals Checklist  Pain/Anxiety/Delirium protocol (if indicated): Titrate midazolam and fentanyl infusions to keep RASS -4 while prone.  Continue enteral sedatives VAP protocol (if indicated): Bundle in place Respiratory support goals: Continue on PRVC at current settings.  Continues to require prone ventilation. Blood pressure target: On no vasopressors. Maintain MAP greater than 65. DVT prophylaxis: Unfractionated heparin for DVT prophylaxis Nutrition Status: Tolerating tube feeds GI prophylaxis: Pantoprazole Fluid status goals: +3 L.  Diurese today again Urinary catheter: Guide hemodynamic management Central lines: Left subclavian central line Glucose control: Type 2 diabetes with stress hyperglycemia adequate control at present. Mobility/therapy needs: Bedrest Antibiotic de-escalation: Continue steroid taper. Stop antibiotics if cultures negative at 48 hours Home medication reconciliation: Continue home Synthroid Daily labs: Daily CBC and CMP ordered Code Status: Full code Family Communication: Daughter updated 10/16. Told condition essentially unchanged. Disposition: ICU  Resolved Hospital Problem list    Hyperkalemia- resolved  Assessment & Plan:   Critically ill due to acute hypoxic/hypercapnic respiratory failure and sepsis (POA) due to severe ARDS from COVID19 pneumonia -Continue PRVC with prone ventilation per protocol.  Progress is static. -Attempt diuresis today  COVID -19 - unvaccinated. Refused remdesivir -Taper methylprednisolone.   Concern for sepsis 06/01/2020-now off vasopressors -Stop antibiotics as cultures negative  Acute on  chronic renal failure  - thought to be ATN on baseline CKD Currently no indications for dialysis and creatinine improving -Family is agreeable for HD if needed  -Continue to at attempt diuresis  ABLA secondary to Rectus sheath hematoma seen on CT 10/6-no signs of active bleeding -Transfuse as necessary -currently stable.  LABS    PULMONARY Recent Labs  Lab 06/04/20 0032 06/05/20 0921 06/05/20 0937 06/06/20 1059 06/07/20 0906  PHART 7.256* 7.315* 7.329* 7.359 7.279*  PCO2ART 65.9* 55.4* 55.0* 52.1* 67.3*  PO2ART 118* 43* 58* 49* 61*  HCO3 29.4* 28.1* 28.8* 29.4* 31.6*  TCO2 31 30 30 31  34*  O2SAT 98.0 72.0 86.0 83.0 87.0    CBC Recent Labs  Lab 06/05/20 0236 06/05/20 0921 06/06/20 0405 06/06/20 0405 06/06/20 1059 06/07/20 0356 06/07/20 0906  HGB 8.8*   < > 9.0*   < > 8.5* 8.0* 12.9  HCT 27.1*   < > 29.0*   < > 25.0* 26.9* 38.0  WBC 9.4  --  7.9  --   --  5.4  --   PLT 103*  --  113*  --   --  99*  --    < > =  values in this interval not displayed.    COAGULATION No results for input(s): INR in the last 168 hours.  CARDIAC  No results for input(s): TROPONINI in the last 168 hours. No results for input(s): PROBNP in the last 168 hours.   CHEMISTRY Recent Labs  Lab 06/01/20 0243 06/01/20 0558 06/02/20 0450 06/02/20 1050 06/03/20 0429 06/03/20 1617 06/04/20 0552 06/04/20 0552 06/05/20 0236 06/05/20 0921 06/05/20 0937 06/05/20 0937 06/06/20 0405 06/06/20 0405 06/06/20 1059 06/06/20 1059 06/07/20 0356 06/07/20 0906  NA 152*   < > 154*   < > 149*   < > 145   < > 144   < > 144  --  145  --  142  --  143 142  K 5.0   < > 4.7   < > 5.4*   < > 5.8*   < > 5.2*   < > 5.2*   < > 5.4*   < > 5.1   < > 5.1 4.8  CL 115*   < > 118*   < > 115*  --  110  --  108  --   --   --  107  --   --   --  105  --   CO2 26   < > 25   < > 25  --  24  --  25  --   --   --  27  --   --   --  28  --   GLUCOSE 175*   < > 107*   < > 202*  --  195*  --  117*  --   --   --  158*   --   --   --  227*  --   BUN 118*   < > 123*   < > 115*  --  113*  --  117*  --   --   --  111*  --   --   --  109*  --   CREATININE 3.27*   < > 2.96*   < > 2.71*  --  2.64*  --  2.42*  --   --   --  2.08*  --   --   --  1.90*  --   CALCIUM 8.9   < > 8.9   < > 8.7*  --  8.5*  --  8.8*  --   --   --  9.1  --   --   --  8.8*  --   MG 2.5*  --  2.4  --   --   --   --   --   --   --   --   --   --   --   --   --   --   --   PHOS 4.9*  --  4.0  --  4.2  --  5.9*  --   --   --   --   --   --   --   --   --   --   --    < > = values in this interval not displayed.   Estimated Creatinine Clearance: 27.8 mL/min (A) (by C-G formula based on SCr of 1.9 mg/dL (H)).   LIVER Recent Labs  Lab 06/03/20 0429 06/04/20 0552 06/05/20 0236 06/06/20 0405 06/07/20 0356  AST 17 17 22 17 15   ALT 16 17 22 20 20   ALKPHOS 58 53  60 53 59  BILITOT 0.2* 0.5 0.6 0.6 0.5  PROT 5.1* 4.8* 5.3* 5.3* 5.1*  ALBUMIN 1.3* 1.2* 1.4* 1.4* 1.3*     INFECTIOUS Recent Labs  Lab 06/01/20 1138 06/01/20 1139 06/02/20 0450 06/03/20 0429  LATICACIDVEN  --  1.1  --   --   PROCALCITON 0.21  --  0.45 1.74     ENDOCRINE CBG (last 3)  Recent Labs    06/07/20 0333 06/07/20 0810 06/07/20 1205  GLUCAP 211* 201* 232*   CRITICAL CARE Performed by: Kipp Brood   Total critical care time: 40 minutes  Critical care time was exclusive of separately billable procedures and treating other patients.  Critical care was necessary to treat or prevent imminent or life-threatening deterioration.  Critical care was time spent personally by me on the following activities: development of treatment plan with patient and/or surrogate as well as nursing, discussions with consultants, evaluation of patient's response to treatment, examination of patient, obtaining history from patient or surrogate, ordering and performing treatments and interventions, ordering and review of laboratory studies, ordering and review of radiographic  studies, pulse oximetry, re-evaluation of patient's condition and participation in multidisciplinary rounds.  Kipp Brood, MD [facility] ICU Physician [place]  Pager: [phone] Mobile: [phone] After hours: [phone].

## 2020-06-07 NOTE — Therapy (Signed)
Pt just flipped and is currently supine at 0530. Pt has also been re-tapped.

## 2020-06-07 NOTE — Progress Notes (Signed)
[place] Progress Note Patient Name: RUWEYDA MACKNIGHT DOB: 04-20-1951 MRN: [id number]   Date of Service  06/07/2020  HPI/Events of Note  Nursing request for CMP in AM/  eICU Interventions  Will order CMP at 5 AM.      Intervention Category Major Interventions: Other:  Nikita Surman Cornelia Copa 06/07/2020, 3:30 AM

## 2020-06-08 DIAGNOSIS — U071 COVID-19: Secondary | ICD-10-CM | POA: Diagnosis not present

## 2020-06-08 DIAGNOSIS — J9601 Acute respiratory failure with hypoxia: Secondary | ICD-10-CM | POA: Diagnosis not present

## 2020-06-08 LAB — POCT I-STAT 7, (LYTES, BLD GAS, ICA,H+H)
Acid-Base Excess: 4 mmol/L — ABNORMAL HIGH (ref 0.0–2.0)
Bicarbonate: 31.6 mmol/L — ABNORMAL HIGH (ref 20.0–28.0)
Calcium, Ion: 1.22 mmol/L (ref 1.15–1.40)
HCT: 45 % (ref 36.0–46.0)
Hemoglobin: 15.3 g/dL — ABNORMAL HIGH (ref 12.0–15.0)
O2 Saturation: 78 %
Patient temperature: 37.6
Potassium: 4.7 mmol/L (ref 3.5–5.1)
Sodium: 137 mmol/L (ref 135–145)
TCO2: 33 mmol/L — ABNORMAL HIGH (ref 22–32)
pCO2 arterial: 58.2 mmHg — ABNORMAL HIGH (ref 32.0–48.0)
pH, Arterial: 7.346 — ABNORMAL LOW (ref 7.350–7.450)
pO2, Arterial: 47 mmHg — ABNORMAL LOW (ref 83.0–108.0)

## 2020-06-08 LAB — CBC WITH DIFFERENTIAL/PLATELET
Abs Immature Granulocytes: 0 10*3/uL (ref 0.00–0.07)
Band Neutrophils: 3 %
Basophils Absolute: 0 10*3/uL (ref 0.0–0.1)
Basophils Relative: 0 %
Eosinophils Absolute: 0.1 10*3/uL (ref 0.0–0.5)
Eosinophils Relative: 1 %
HCT: 26.8 % — ABNORMAL LOW (ref 36.0–46.0)
Hemoglobin: 8.1 g/dL — ABNORMAL LOW (ref 12.0–15.0)
Lymphocytes Relative: 6 %
Lymphs Abs: 0.3 10*3/uL — ABNORMAL LOW (ref 0.7–4.0)
MCH: 30.3 pg (ref 26.0–34.0)
MCHC: 30.2 g/dL (ref 30.0–36.0)
MCV: 100.4 fL — ABNORMAL HIGH (ref 80.0–100.0)
Monocytes Absolute: 0.1 10*3/uL (ref 0.1–1.0)
Monocytes Relative: 1 %
Neutro Abs: 4.6 10*3/uL (ref 1.7–7.7)
Neutrophils Relative %: 89 %
Platelets: 100 10*3/uL — ABNORMAL LOW (ref 150–400)
RBC: 2.67 MIL/uL — ABNORMAL LOW (ref 3.87–5.11)
RDW: 15 % (ref 11.5–15.5)
WBC: 5 10*3/uL (ref 4.0–10.5)
nRBC: 0 % (ref 0.0–0.2)

## 2020-06-08 LAB — GLUCOSE, CAPILLARY
Glucose-Capillary: 174 mg/dL — ABNORMAL HIGH (ref 70–99)
Glucose-Capillary: 184 mg/dL — ABNORMAL HIGH (ref 70–99)
Glucose-Capillary: 184 mg/dL — ABNORMAL HIGH (ref 70–99)
Glucose-Capillary: 188 mg/dL — ABNORMAL HIGH (ref 70–99)
Glucose-Capillary: 196 mg/dL — ABNORMAL HIGH (ref 70–99)
Glucose-Capillary: 215 mg/dL — ABNORMAL HIGH (ref 70–99)
Glucose-Capillary: 226 mg/dL — ABNORMAL HIGH (ref 70–99)

## 2020-06-08 LAB — BASIC METABOLIC PANEL
Anion gap: 9 (ref 5–15)
BUN: 106 mg/dL — ABNORMAL HIGH (ref 8–23)
CO2: 30 mmol/L (ref 22–32)
Calcium: 8.6 mg/dL — ABNORMAL LOW (ref 8.9–10.3)
Chloride: 101 mmol/L (ref 98–111)
Creatinine, Ser: 1.7 mg/dL — ABNORMAL HIGH (ref 0.44–1.00)
GFR, Estimated: 30 mL/min — ABNORMAL LOW (ref >60)
Glucose, Bld: 198 mg/dL — ABNORMAL HIGH (ref 70–99)
Potassium: 4.8 mmol/L (ref 3.5–5.1)
Sodium: 140 mmol/L (ref 135–145)

## 2020-06-08 MED ORDER — METOLAZONE 5 MG PO TABS
2.5000 mg | ORAL_TABLET | Freq: Once | ORAL | Status: AC
  Administered 2020-06-08: 2.5 mg via ORAL
  Filled 2020-06-08: qty 1

## 2020-06-08 MED ORDER — FREE WATER
200.0000 mL | Status: DC
  Administered 2020-06-08 – 2020-06-10 (×12): 200 mL

## 2020-06-08 NOTE — Progress Notes (Signed)
NAME:  [name], MRN:  [id number], DOB:  Dec 29, 1950, LOS: 24 ADMISSION DATE:  04/29/2020, CONSULTATION DATE:  10/5 REFERRING MD: Memon/ Triad, CHIEF COMPLAINT:  resp distress    Brief History     20 yowf , with history of T2DM, OSA, Hothyroidism, HTN, HLD, hepatitis, and GERD presents to the ED with a chief complaint of dyspnea.  Patient reports that symptom onset was 5 weeks ago, and started with diverticulitis.  Patient reports that since then she has had dyspnea and cough that started simultaneously.  The dyspnea and cough became worse over the last 2 weeks.  Patient reports that dyspnea is worse with exertion, and she can even walk now.  She reports that she fell on 9/29, which she attributes to being so weak.  She describes the fall as she was sitting in her chair got up took 2 steps and ended up on the ground.  She does not remember feeling lightheaded, or any preceding symptoms, and she did not hit her head.  Patient reports that she is weak because she has had decreased appetite and decreased p.o. intake since the diverticulitis 5 weeks pta.  Patient reports that her taste and smell are intact.  She does not have chest pain or palpitations.  She reports that her cough is a dry cough.  She had fevers for the first 3 weeks of her symptoms, but not for the last 2 weeks.  She reports that her T-max was 103.1, and Tylenol helps when she remembers to take it.  Patient has not had body aches, nausea vomiting, diarrhea, dysuria.  She does admit to shakiness, and vision changes over the last 2 weeks.  She reports that she cannot focus her eyes, so is blurry vision, but not double vision.  She does not have a sore throat and is not has clogged sinuses.  Patient did not get a Covid vaccine, because "I did not want 1."  Patient is refusing remdesivir.  Past Medical History  DM  II OSA Hypothyroid HBP HLD GERD   Significant Hospital Events   04/28/2020 - admit 05/30/20 - Sedated and prone . No acute  issues overnight received on dose of NMB last night and again this AM  10/19 -  05/31/2020: On fentanyl infusion, Versed infusion.  Off Levophed infusion and then back on.  On tube feeds.  On the ventilator FiO2 60%. Peep 12 -> pulse ox 95%. Afebrile.  Supine since 4am. PF ratio 201 10/17 continues on prone and supine ventilation.  Consults:  PCCM  10/5  Procedures:  Oral ET  10/5  L IJ CVL  10/5   Significant Diagnostic Tests:  CT Abd L large rectus sheath hematoma, COVID PNA on visible lung fields  Micro Data:  Covid 19  PCR  9/30  POS MRSA PCR  10/1 >   Neg   Blood 05/27/2020: Negative Tracheal aspirate 05/28/2020: Rare Candida blood culture Blood 06/01/2020 Urine 06/01/2020 Tracheal aspirate 06/01/2020 Procalcitonin 06/01/2020  ID Rx:  Baricitinib 10/3 > 10/5 (stopped following abd pain and rise in LFT) Remdesivir [refused] Solu-Medrol 10/1  > (stop in 14 days total)  Interim history/subjective:   Continues to have much better oxygenation prone than supine.  However more settled even supine with less tachypnea.  Objective   Blood pressure (!) 132/55, pulse 88, temperature 98.4 F (36.9 C), temperature source Core, resp. rate (!) 37, height 5\' 1"  (1.549 m), weight 83.5 kg, SpO2 97 %.    Vent  Mode: PRVC FiO2 (%):  [40 %-70 %] 40 % Set Rate:  [35 bmp] 35 bmp Vt Set:  [350 mL] 350 mL PEEP:  [18 cmH20] 18 cmH20 Plateau Pressure:  [32 cmH20-37 cmH20] 33 cmH20   Intake/Output Summary (Last 24 hours) at 06/08/2020 1022 Last data filed at 06/08/2020 0700 Gross per 24 hour  Intake 4304.29 ml  Output 2265 ml  Net 2039.29 ml   Filed Weights   06/05/20 0500 06/06/20 0402 06/07/20 0405  Weight: 85.4 kg 83.7 kg 83.5 kg   Physical exam   General: Obese woman, appears chronically ill. Intubated, sedated in prone position HEENT: OG tube, ET tube in place with no skin breakdown. Minimal facial edema. Respiratory: Supine ventilation with tachypnea and occasional ventilator  dyssynchrony. Fine crackles with bronchial breath sounds at left base. Acceptable airway pressures, Cardiovascular: Extremities warm well perfused moderate edema.  Heart sounds are unremarkable   Abdomen soft nontender.  Heparin ecchymoses lower abdomen but no palpable mass Neuro: No neuromuscular blockade. Deeply sedated RASS -4, no response to pain or voice. Extremities: No skin breakdown Central line site intact.  Daily Goals Checklist  Pain/Anxiety/Delirium protocol (if indicated): Titrate midazolam and fentanyl infusions to keep RASS -4 while prone.  Continue enteral sedatives VAP protocol (if indicated): Bundle in place Respiratory support goals: Continue on PRVC at current settings.  Continues to require prone ventilation. Blood pressure target: On no vasopressors. Maintain MAP greater than 65. DVT prophylaxis: Unfractionated heparin for DVT prophylaxis Nutrition Status: Tolerating tube feeds GI prophylaxis: Pantoprazole Fluid status goals: +3 L.  Diurese today again Urinary catheter: Guide hemodynamic management Central lines: Left subclavian central line Glucose control: Type 2 diabetes with stress hyperglycemia adequate control at present. Mobility/therapy needs: Bedrest Antibiotic de-escalation: Continue steroid taper. Stop antibiotics if cultures negative at 48 hours Home medication reconciliation: Continue home Synthroid Daily labs: Daily CBC and CMP ordered Code Status: Full code Family Communication: Daughter updated 10/16. Told condition essentially unchanged. Disposition: ICU  Resolved Hospital Problem list    Hyperkalemia- resolved  Assessment & Plan:   Critically ill due to acute hypoxic/hypercapnic respiratory failure and sepsis (POA) due to severe ARDS from COVID19 pneumonia -Continue PRVC with prone ventilation per protocol.  Progress is static. -Continue diuresis today  COVID -19 - unvaccinated. Refused remdesivir -Taper methylprednisolone.   Concern for  sepsis 06/01/2020-now off vasopressors -Stop antibiotics as cultures negative  Acute on chronic renal failure  - thought to be ATN on baseline CKD Currently no indications for dialysis and creatinine improving -Family is agreeable for HD if needed  -Continue to at attempt diuresis  ABLA secondary to Rectus sheath hematoma seen on CT 10/6-no signs of active bleeding -Transfuse as necessary -currently stable.  LABS    PULMONARY Recent Labs  Lab 06/04/20 0032 06/05/20 0921 06/05/20 0937 06/06/20 1059 06/07/20 0906  PHART 7.256* 7.315* 7.329* 7.359 7.279*  PCO2ART 65.9* 55.4* 55.0* 52.1* 67.3*  PO2ART 118* 43* 58* 49* 61*  HCO3 29.4* 28.1* 28.8* 29.4* 31.6*  TCO2 31 30 30 31  34*  O2SAT 98.0 72.0 86.0 83.0 87.0    CBC Recent Labs  Lab 06/06/20 0405 06/06/20 1059 06/07/20 0356 06/07/20 0906 06/08/20 0536  HGB 9.0*   < > 8.0* 12.9 8.1*  HCT 29.0*   < > 26.9* 38.0 26.8*  WBC 7.9  --  5.4  --  5.0  PLT 113*  --  99*  --  100*   < > = values in this interval not displayed.  COAGULATION No results for input(s): INR in the last 168 hours.  CARDIAC  No results for input(s): TROPONINI in the last 168 hours. No results for input(s): PROBNP in the last 168 hours.   CHEMISTRY Recent Labs  Lab 06/02/20 0450 06/02/20 1050 06/03/20 0429 06/03/20 1617 06/04/20 0552 06/04/20 0552 06/05/20 0236 06/05/20 0921 06/06/20 0405 06/06/20 0405 06/06/20 1059 06/06/20 1059 06/07/20 0356 06/07/20 0356 06/07/20 0906 06/08/20 0536  NA 154*   < > 149*   < > 145   < > 144   < > 145  --  142  --  143  --  142 140  K 4.7   < > 5.4*   < > 5.8*   < > 5.2*   < > 5.4*   < > 5.1   < > 5.1   < > 4.8 4.8  CL 118*   < > 115*  --  110  --  108  --  107  --   --   --  105  --   --  101  CO2 25   < > 25  --  24  --  25  --  27  --   --   --  28  --   --  30  GLUCOSE 107*   < > 202*  --  195*  --  117*  --  158*  --   --   --  227*  --   --  198*  BUN 123*   < > 115*  --  113*  --  117*   --  111*  --   --   --  109*  --   --  106*  CREATININE 2.96*   < > 2.71*  --  2.64*  --  2.42*  --  2.08*  --   --   --  1.90*  --   --  1.70*  CALCIUM 8.9   < > 8.7*  --  8.5*  --  8.8*  --  9.1  --   --   --  8.8*  --   --  8.6*  MG 2.4  --   --   --   --   --   --   --   --   --   --   --   --   --   --   --   PHOS 4.0  --  4.2  --  5.9*  --   --   --   --   --   --   --   --   --   --   --    < > = values in this interval not displayed.   Estimated Creatinine Clearance: 31.1 mL/min (A) (by C-G formula based on SCr of 1.7 mg/dL (H)).   LIVER Recent Labs  Lab 06/03/20 0429 06/04/20 0552 06/05/20 0236 06/06/20 0405 06/07/20 0356  AST 17 17 22 17 15   ALT 16 17 22 20 20   ALKPHOS 58 53 60 53 59  BILITOT 0.2* 0.5 0.6 0.6 0.5  PROT 5.1* 4.8* 5.3* 5.3* 5.1*  ALBUMIN 1.3* 1.2* 1.4* 1.4* 1.3*     INFECTIOUS Recent Labs  Lab 06/01/20 1138 06/01/20 1139 06/02/20 0450 06/03/20 0429  LATICACIDVEN  --  1.1  --   --   PROCALCITON 0.21  --  0.45 1.74     ENDOCRINE CBG (last 3)  Recent Labs  06/07/20 2359 06/08/20 0338 06/08/20 0810  GLUCAP 184* 184* 188*   CRITICAL CARE Performed by: Kipp Brood   Total critical care time: 40 minutes  Critical care time was exclusive of separately billable procedures and treating other patients.  Critical care was necessary to treat or prevent imminent or life-threatening deterioration.  Critical care was time spent personally by me on the following activities: development of treatment plan with patient and/or surrogate as well as nursing, discussions with consultants, evaluation of patient's response to treatment, examination of patient, obtaining history from patient or surrogate, ordering and performing treatments and interventions, ordering and review of laboratory studies, ordering and review of radiographic studies, pulse oximetry, re-evaluation of patient's condition and participation in multidisciplinary rounds.  Kipp Brood, MD [facility] ICU Physician [place]  Pager: [phone] Mobile: [phone] After hours: [phone].

## 2020-06-08 NOTE — Progress Notes (Signed)
[place] Progress Note Patient Name: [name] DOB: [birth year] MRN: [id number]   Date of Service  06/08/2020  HPI/Events of Note  Nurssing request for BMP in AM.  eICU Interventions  Will order BMP at 5 AM.         Lysle Dingwall 06/08/2020, 3:55 AM

## 2020-06-08 NOTE — Therapy (Signed)
Pt head turned to the right

## 2020-06-08 NOTE — Progress Notes (Signed)
Pt supined at this time with no complications. ET secured with commercial tube holder. No Breakdown noted on face. VS within normal limits

## 2020-06-09 ENCOUNTER — Inpatient Hospital Stay (HOSPITAL_COMMUNITY): Payer: Medicare HMO

## 2020-06-09 DIAGNOSIS — J9601 Acute respiratory failure with hypoxia: Secondary | ICD-10-CM | POA: Diagnosis not present

## 2020-06-09 DIAGNOSIS — U071 COVID-19: Secondary | ICD-10-CM | POA: Diagnosis not present

## 2020-06-09 DIAGNOSIS — J80 Acute respiratory distress syndrome: Secondary | ICD-10-CM

## 2020-06-09 LAB — CBC WITH DIFFERENTIAL/PLATELET
Abs Immature Granulocytes: 0.09 10*3/uL — ABNORMAL HIGH (ref 0.00–0.07)
Basophils Absolute: 0 10*3/uL (ref 0.0–0.1)
Basophils Relative: 0 %
Eosinophils Absolute: 0.1 10*3/uL (ref 0.0–0.5)
Eosinophils Relative: 2 %
HCT: 25.8 % — ABNORMAL LOW (ref 36.0–46.0)
Hemoglobin: 7.8 g/dL — ABNORMAL LOW (ref 12.0–15.0)
Immature Granulocytes: 2 %
Lymphocytes Relative: 8 %
Lymphs Abs: 0.4 10*3/uL — ABNORMAL LOW (ref 0.7–4.0)
MCH: 29.8 pg (ref 26.0–34.0)
MCHC: 30.2 g/dL (ref 30.0–36.0)
MCV: 98.5 fL (ref 80.0–100.0)
Monocytes Absolute: 0.2 10*3/uL (ref 0.1–1.0)
Monocytes Relative: 3 %
Neutro Abs: 4.5 10*3/uL (ref 1.7–7.7)
Neutrophils Relative %: 85 %
Platelets: 114 10*3/uL — ABNORMAL LOW (ref 150–400)
RBC: 2.62 MIL/uL — ABNORMAL LOW (ref 3.87–5.11)
RDW: 14.8 % (ref 11.5–15.5)
WBC: 5.3 10*3/uL (ref 4.0–10.5)
nRBC: 0 % (ref 0.0–0.2)

## 2020-06-09 LAB — POCT I-STAT 7, (LYTES, BLD GAS, ICA,H+H)
Acid-Base Excess: 7 mmol/L — ABNORMAL HIGH (ref 0.0–2.0)
Bicarbonate: 34.1 mmol/L — ABNORMAL HIGH (ref 20.0–28.0)
Calcium, Ion: 1.28 mmol/L (ref 1.15–1.40)
HCT: 23 % — ABNORMAL LOW (ref 36.0–46.0)
Hemoglobin: 7.8 g/dL — ABNORMAL LOW (ref 12.0–15.0)
O2 Saturation: 75 %
Potassium: 4.9 mmol/L (ref 3.5–5.1)
Sodium: 137 mmol/L (ref 135–145)
TCO2: 36 mmol/L — ABNORMAL HIGH (ref 22–32)
pCO2 arterial: 67 mmHg (ref 32.0–48.0)
pH, Arterial: 7.315 — ABNORMAL LOW (ref 7.350–7.450)
pO2, Arterial: 46 mmHg — ABNORMAL LOW (ref 83.0–108.0)

## 2020-06-09 LAB — GLUCOSE, CAPILLARY
Glucose-Capillary: 112 mg/dL — ABNORMAL HIGH (ref 70–99)
Glucose-Capillary: 127 mg/dL — ABNORMAL HIGH (ref 70–99)
Glucose-Capillary: 147 mg/dL — ABNORMAL HIGH (ref 70–99)
Glucose-Capillary: 180 mg/dL — ABNORMAL HIGH (ref 70–99)
Glucose-Capillary: 190 mg/dL — ABNORMAL HIGH (ref 70–99)
Glucose-Capillary: 88 mg/dL (ref 70–99)

## 2020-06-09 LAB — BASIC METABOLIC PANEL
Anion gap: 9 (ref 5–15)
BUN: 97 mg/dL — ABNORMAL HIGH (ref 8–23)
CO2: 31 mmol/L (ref 22–32)
Calcium: 8.7 mg/dL — ABNORMAL LOW (ref 8.9–10.3)
Chloride: 98 mmol/L (ref 98–111)
Creatinine, Ser: 1.57 mg/dL — ABNORMAL HIGH (ref 0.44–1.00)
GFR, Estimated: 34 mL/min — ABNORMAL LOW (ref >60)
Glucose, Bld: 103 mg/dL — ABNORMAL HIGH (ref 70–99)
Potassium: 4.2 mmol/L (ref 3.5–5.1)
Sodium: 138 mmol/L (ref 135–145)

## 2020-06-09 LAB — PROCALCITONIN: Procalcitonin: 1.48 ng/mL

## 2020-06-09 MED ORDER — POTASSIUM CHLORIDE 20 MEQ/15ML (10%) PO SOLN
20.0000 meq | Freq: Two times a day (BID) | ORAL | Status: AC
  Administered 2020-06-09 (×2): 20 meq
  Filled 2020-06-09 (×2): qty 15

## 2020-06-09 MED ORDER — ALBUTEROL SULFATE (2.5 MG/3ML) 0.083% IN NEBU
2.5000 mg | INHALATION_SOLUTION | Freq: Once | RESPIRATORY_TRACT | Status: AC
  Administered 2020-06-09: 2.5 mg via RESPIRATORY_TRACT
  Filled 2020-06-09: qty 3

## 2020-06-09 MED ORDER — FUROSEMIDE 10 MG/ML IJ SOLN
80.0000 mg | Freq: Two times a day (BID) | INTRAMUSCULAR | Status: AC
  Administered 2020-06-09 – 2020-06-10 (×2): 80 mg via INTRAVENOUS
  Filled 2020-06-09 (×2): qty 8

## 2020-06-09 NOTE — Progress Notes (Signed)
Upon proning, patient vomited approximately 25 ml tube feed from mouth. Patient suctioned. No desaturations occurred nor changes observed in respiratory status. Tube feeds stopped. MD notified. Per Dr. Gillermina Phy, RN to resume feeds at 20 ml/hr and increase by end of shift. Tube feeds increased back up to 60 ml (previous rate) per orders as of time of this note. No further episodes of emesis or changes exhibited in respiratory status. Airway clear.

## 2020-06-09 NOTE — Progress Notes (Signed)
Pt returned to the supine position with RTx2 and RN x3. Cloth tape and protective barriers removed. ETT resecured with a hollister tube holder at 22cm. No breakdown to the pt cheeks, lips or gums. PT with bloody nose and bilateral swelling to the eyes. RT will continue to monitor.

## 2020-06-09 NOTE — Progress Notes (Signed)
  Spoke and updated daughter, Rise Paganini by phone.  All questions answered.  As patient is day 13/x of ETT, we continued previous discussions about a tracheostomy. Rise Paganini, who is a [employer] scientist, would like to discuss further with her dad before deciding later this week.    Additionally, family is anticipating and looking forward to visiting patient when she comes off isolation later this week (positive SARS2 9/30).  In the interim, she is interested in setting up a elink video visit and will set up time with RN.     Kennieth Rad, ACNP [facility] 06/09/2020, 1:35 PM

## 2020-06-09 NOTE — Therapy (Addendum)
Pt re-tapped and proned at this time. Tolerated well 

## 2020-06-09 NOTE — Progress Notes (Signed)
p    NAME:  [name], MRN:  [id number], DOB:  [birth year], LOS: 65 ADMISSION DATE:  05/09/2020, CONSULTATION DATE:  10/5 REFERRING MD: Memon/ Triad, CHIEF COMPLAINT:  resp distress    Brief History     84 yowf , with history of T2DM, OSA, Hothyroidism, HTN, HLD, hepatitis, and GERD presents to the ED with a chief complaint of dyspnea.  Patient reports that symptom onset was 5 weeks ago, and started with diverticulitis.  Patient reports that since then she has had dyspnea and cough that started simultaneously.  The dyspnea and cough became worse over the last 2 weeks.  Patient reports that dyspnea is worse with exertion, and she can even walk now.  She reports that she fell on 9/29, which she attributes to being so weak.  She describes the fall as she was sitting in her chair got up took 2 steps and ended up on the ground.  She does not remember feeling lightheaded, or any preceding symptoms, and she did not hit her head.  Patient reports that she is weak because she has had decreased appetite and decreased p.o. intake since the diverticulitis 5 weeks pta.  Patient reports that her taste and smell are intact.  She does not have chest pain or palpitations.  She reports that her cough is a dry cough.  She had fevers for the first 3 weeks of her symptoms, but not for the last 2 weeks.  She reports that her T-max was 103.1, and Tylenol helps when she remembers to take it.  Patient has not had body aches, nausea vomiting, diarrhea, dysuria.  She does admit to shakiness, and vision changes over the last 2 weeks.  She reports that she cannot focus her eyes, so is blurry vision, but not double vision.  She does not have a sore throat and is not has clogged sinuses.  Patient did not get a Covid vaccine, because "I did not want 1."  Patient is refusing remdesivir.  Past Medical History  DM  II OSA Hypothyroid HBP HLD GERD   Significant Hospital Events   05/16/2020 - admit 05/30/20 - Sedated and prone . No  acute issues overnight received on dose of NMB last night and again this AM  10/19 -  05/31/2020: On fentanyl infusion, Versed infusion.  Off Levophed infusion and then back on.  On tube feeds.  On the ventilator FiO2 60%. Peep 12 -> pulse ox 95%. Afebrile.  Supine since 4am. PF ratio 201 10/17 continues on prone and supine ventilation.  Consults:  PCCM  10/5  Procedures:  Oral ET  10/5  L IJ CVL  10/5   Significant Diagnostic Tests:  CT Abd L large rectus sheath hematoma, COVID PNA on visible lung fields  Micro Data:  Covid 19  PCR  9/30  POS MRSA PCR  10/1 >   Neg   Blood 05/27/2020: Negative Tracheal aspirate 05/28/2020: Rare Candida blood culture Blood 06/01/2020 >> neg Tracheal aspirate 06/01/2020 >> few candida albicans  ID Rx:  Baricitinib 10/3 > 10/5 (stopped following abd pain and rise in LFT) Remdesivir [refused] Solu-Medrol 10/1  > (stop in 14 days total) Cefepime 10/10 >> 10/11  Interim history/subjective:  Vomited small amount of TF after proning overnight Tmax 101.1 -853/ net +16.2 L  Objective   Blood pressure (!) 123/53, pulse 90, temperature 99.7 F (37.6 C), resp. rate (!) 27, height 5\' 1"  (1.549 m), weight 77.3 kg, SpO2 97 %.    Vent  Mode: PRVC FiO2 (%):  [60 %-80 %] 80 % Set Rate:  [35 bmp] 35 bmp Vt Set:  [350 mL] 350 mL PEEP:  [18 cmH20] 18 cmH20 Plateau Pressure:  [30 cmH20-34 cmH20] 30 cmH20   Intake/Output Summary (Last 24 hours) at 06/09/2020 0842 Last data filed at 06/09/2020 0700 Gross per 24 hour  Intake 3236.16 ml  Output 4200 ml  Net -963.84 ml   Filed Weights   06/06/20 0402 06/07/20 0405 06/09/20 0425  Weight: 83.7 kg 83.5 kg 77.3 kg   Physical exam  General:  Critically ill older female sedated/ intubated on MV, currently prone HEENT: ETT/ OGT, MM pink/moist, left pupil 3/reactive, right unable to visualize due to dependent facial edema Neuro: sedated RASS -5 CV: NSR  PULM:  MV supported breaths, occasional breath over MV,  coarse throughout with rub, possible faint exp wheeze right base Pplat 34, driving pressure 16 GI: obese, +bs, flexiseal and foley- cyu Extremities: warm/dry, generalized edema +1 Skin: no rashes, pale   Resolved Hospital Problem list    Hyperkalemia- resolved  Assessment & Plan:   Critically ill due to acute hypoxic/hypercapnic respiratory failure and sepsis (POA) due to severe ARDS from COVID19 pneumonia Continue mechanical ventilation per ARDS protocol Target TVol 6-8cc/kgIBW, currently at 7.5 Target Plateau Pressure < 30cm H20 Target driving pressure less than 15 cm of water Target PaO2 55-65: titrate PEEP/ FiO2 per protocol As long as PaO2 to FiO2 ratio is less than 1:150 position in prone position for 16 hours a day Check CVP daily  Ventilator associated pneumonia prevention protocol Intermittent CXR/ ABG prn  PAD protocol with fentanyl/ versed gtt with scheduled enteral oxy IR / klonopin  COVID -19 - unvaccinated. Refused remdesivir Methylprednisolone taper completed 10/14  Concern for sepsis, tmax 101.1 this morning Check PCT WBC remains reassuring and weaning FiO2 requirements  Pan culture and start empiric abx if fever ongoing   Acute on chronic renal failure  - thought to be ATN on baseline CKD Currently no indications for dialysis.  Creatinine continues to improve with good UOP Continue diuresis - lasix 80 mg BID with KCL 20 meq x 2 Strict I/Os Trend renal indices   ABLA secondary to Rectus sheath hematoma seen on CT 10/6-no signs of active bleeding Hgb stable, no further signs of bleeding Trend CBC, transfuse for Hgb <7  Emesis x 1 overnight Seems to be tolerate restarting of TFs, monitor closely    Daily Goals Checklist  Pain/Anxiety/Delirium protocol (if indicated): Fentanyl/ versed gtt, enteral OxyIR/ klonopin VAP protocol (if indicated): Bundle in place Respiratory support goals: Continue on PRVC at current settings.  Continues to require prone  ventilation. Blood pressure target: Not requiring vasopressors. Maintain MAP greater than 65. DVT prophylaxis: Unfractionated heparin for DVT prophylaxis Nutrition Status: EN GI prophylaxis: Pantoprazole Fluid status goals: -853 ml/ 24hrs; net +16.2L  Diurese today again Urinary catheter: Guide hemodynamic management Central lines: Left subclavian central line Glucose control: Type 2 diabetes with stress hyperglycemia adequate control at present. Mobility/therapy needs: Bedrest Antibiotic de-escalation: monitor clinically  Home medication reconciliation: Continue home Synthroid Daily labs: CBC/ BMET Code Status: Full code Family Communication: pending 10/18 Disposition: ICU  LABS    PULMONARY Recent Labs  Lab 06/05/20 0921 06/05/20 0937 06/06/20 1059 06/07/20 0906 06/08/20 1647  PHART 7.315* 7.329* 7.359 7.279* 7.346*  PCO2ART 55.4* 55.0* 52.1* 67.3* 58.2*  PO2ART 43* 58* 49* 61* 47*  HCO3 28.1* 28.8* 29.4* 31.6* 31.6*  TCO2 30 30 31  34* 33*  O2SAT 72.0 86.0 83.0 87.0 78.0    CBC Recent Labs  Lab 06/07/20 0356 06/07/20 0906 06/08/20 0536 06/08/20 1647 06/09/20 0457  HGB 8.0*   < > 8.1* 15.3* 7.8*  HCT 26.9*   < > 26.8* 45.0 25.8*  WBC 5.4  --  5.0  --  5.3  PLT 99*  --  100*  --  114*   < > = values in this interval not displayed.    COAGULATION No results for input(s): INR in the last 168 hours.  CARDIAC  No results for input(s): TROPONINI in the last 168 hours. No results for input(s): PROBNP in the last 168 hours.   CHEMISTRY Recent Labs  Lab 06/03/20 0429 06/03/20 1617 06/04/20 0552 06/04/20 0552 06/05/20 0236 06/05/20 0921 06/06/20 0405 06/06/20 0405 06/06/20 1059 06/06/20 1059 06/07/20 0356 06/07/20 0356 06/07/20 0906 06/07/20 0906 06/08/20 0536 06/08/20 1647  NA 149*   < > 145   < > 144   < > 145   < > 142  --  143  --  142  --  140 137  K 5.4*   < > 5.8*   < > 5.2*   < > 5.4*   < > 5.1   < > 5.1   < > 4.8   < > 4.8 4.7  CL 115*   --  110  --  108  --  107  --   --   --  105  --   --   --  101  --   CO2 25  --  24  --  25  --  27  --   --   --  28  --   --   --  30  --   GLUCOSE 202*  --  195*  --  117*  --  158*  --   --   --  227*  --   --   --  198*  --   BUN 115*  --  113*  --  117*  --  111*  --   --   --  109*  --   --   --  106*  --   CREATININE 2.71*  --  2.64*  --  2.42*  --  2.08*  --   --   --  1.90*  --   --   --  1.70*  --   CALCIUM 8.7*  --  8.5*  --  8.8*  --  9.1  --   --   --  8.8*  --   --   --  8.6*  --   PHOS 4.2  --  5.9*  --   --   --   --   --   --   --   --   --   --   --   --   --    < > = values in this interval not displayed.   Estimated Creatinine Clearance: 29.8 mL/min (A) (by C-G formula based on SCr of 1.7 mg/dL (H)).   LIVER Recent Labs  Lab 06/03/20 0429 06/04/20 0552 06/05/20 0236 06/06/20 0405 06/07/20 0356  AST 17 17 22 17 15   ALT 16 17 22 20 20   ALKPHOS 58 53 60 53 59  BILITOT 0.2* 0.5 0.6 0.6 0.5  PROT 5.1* 4.8* 5.3* 5.3* 5.1*  ALBUMIN 1.3* 1.2* 1.4* 1.4* 1.3*     INFECTIOUS Recent Labs  Lab  06/03/20 0429  PROCALCITON 1.74     ENDOCRINE CBG (last 3)  Recent Labs    06/08/20 2338 06/09/20 0357 06/09/20 0732  GLUCAP 174* 127* 88   CRITICAL CARE Performed by: Kennieth Rad   Total critical care time: 35 minutes  Critical care time was exclusive of separately billable procedures and treating other patients.  Critical care was necessary to treat or prevent imminent or life-threatening deterioration.  Critical care was time spent personally by me on the following activities: development of treatment plan with patient and/or surrogate as well as nursing, discussions with consultants, evaluation of patient's response to treatment, examination of patient, obtaining history from patient or surrogate, ordering and performing treatments and interventions, ordering and review of laboratory studies, ordering and review of radiographic studies, pulse oximetry,  re-evaluation of patient's condition and participation in multidisciplinary rounds.   Kennieth Rad, ACNP [facility] 06/09/2020, 8:42 AM

## 2020-06-10 DIAGNOSIS — J9601 Acute respiratory failure with hypoxia: Secondary | ICD-10-CM | POA: Diagnosis not present

## 2020-06-10 DIAGNOSIS — U071 COVID-19: Secondary | ICD-10-CM | POA: Diagnosis not present

## 2020-06-10 LAB — POCT I-STAT 7, (LYTES, BLD GAS, ICA,H+H)
Acid-Base Excess: 7 mmol/L — ABNORMAL HIGH (ref 0.0–2.0)
Acid-Base Excess: 5 mmol/L — ABNORMAL HIGH (ref 0.0–2.0)
Bicarbonate: 34.1 mmol/L — ABNORMAL HIGH (ref 20.0–28.0)
Bicarbonate: 34.2 mmol/L — ABNORMAL HIGH (ref 20.0–28.0)
Calcium, Ion: 1.26 mmol/L (ref 1.15–1.40)
Calcium, Ion: 1.24 mmol/L (ref 1.15–1.40)
HCT: 22 % — ABNORMAL LOW (ref 36.0–46.0)
HCT: 38 % (ref 36.0–46.0)
Hemoglobin: 7.5 g/dL — ABNORMAL LOW (ref 12.0–15.0)
Hemoglobin: 12.9 g/dL (ref 12.0–15.0)
O2 Saturation: 84 %
O2 Saturation: 82 %
Patient temperature: 37.6
Patient temperature: 98.6
Potassium: 4.9 mmol/L (ref 3.5–5.1)
Potassium: 4.4 mmol/L (ref 3.5–5.1)
Sodium: 138 mmol/L (ref 135–145)
Sodium: 139 mmol/L (ref 135–145)
TCO2: 36 mmol/L — ABNORMAL HIGH (ref 22–32)
TCO2: 36 mmol/L — ABNORMAL HIGH (ref 22–32)
pCO2 arterial: 70 mmHg (ref 32.0–48.0)
pCO2 arterial: 70.6 mmHg (ref 32.0–48.0)
pH, Arterial: 7.298 — ABNORMAL LOW (ref 7.350–7.450)
pH, Arterial: 7.294 — ABNORMAL LOW (ref 7.350–7.450)
pO2, Arterial: 57 mmHg — ABNORMAL LOW (ref 83.0–108.0)
pO2, Arterial: 54 mmHg — ABNORMAL LOW (ref 83.0–108.0)

## 2020-06-10 LAB — BASIC METABOLIC PANEL
Anion gap: 10 (ref 5–15)
BUN: 99 mg/dL — ABNORMAL HIGH (ref 8–23)
CO2: 29 mmol/L (ref 22–32)
Calcium: 8.4 mg/dL — ABNORMAL LOW (ref 8.9–10.3)
Chloride: 99 mmol/L (ref 98–111)
Creatinine, Ser: 1.58 mg/dL — ABNORMAL HIGH (ref 0.44–1.00)
GFR, Estimated: 33 mL/min — ABNORMAL LOW (ref >60)
Glucose, Bld: 192 mg/dL — ABNORMAL HIGH (ref 70–99)
Potassium: 4.8 mmol/L (ref 3.5–5.1)
Sodium: 138 mmol/L (ref 135–145)

## 2020-06-10 LAB — CBC WITH DIFFERENTIAL/PLATELET
Abs Immature Granulocytes: 0.07 10*3/uL (ref 0.00–0.07)
Basophils Absolute: 0 10*3/uL (ref 0.0–0.1)
Basophils Relative: 0 %
Eosinophils Absolute: 0.1 10*3/uL (ref 0.0–0.5)
Eosinophils Relative: 1 %
HCT: 24.8 % — ABNORMAL LOW (ref 36.0–46.0)
Hemoglobin: 7.4 g/dL — ABNORMAL LOW (ref 12.0–15.0)
Immature Granulocytes: 1 %
Lymphocytes Relative: 5 %
Lymphs Abs: 0.3 10*3/uL — ABNORMAL LOW (ref 0.7–4.0)
MCH: 29.6 pg (ref 26.0–34.0)
MCHC: 29.8 g/dL — ABNORMAL LOW (ref 30.0–36.0)
MCV: 99.2 fL (ref 80.0–100.0)
Monocytes Absolute: 0.3 10*3/uL (ref 0.1–1.0)
Monocytes Relative: 5 %
Neutro Abs: 5.4 10*3/uL (ref 1.7–7.7)
Neutrophils Relative %: 88 %
Platelets: 104 10*3/uL — ABNORMAL LOW (ref 150–400)
RBC: 2.5 MIL/uL — ABNORMAL LOW (ref 3.87–5.11)
RDW: 14.8 % (ref 11.5–15.5)
WBC: 6.1 10*3/uL (ref 4.0–10.5)
nRBC: 0 % (ref 0.0–0.2)

## 2020-06-10 LAB — GLUCOSE, CAPILLARY
Glucose-Capillary: 149 mg/dL — ABNORMAL HIGH (ref 70–99)
Glucose-Capillary: 171 mg/dL — ABNORMAL HIGH (ref 70–99)
Glucose-Capillary: 174 mg/dL — ABNORMAL HIGH (ref 70–99)
Glucose-Capillary: 175 mg/dL — ABNORMAL HIGH (ref 70–99)
Glucose-Capillary: 209 mg/dL — ABNORMAL HIGH (ref 70–99)
Glucose-Capillary: 46 mg/dL — ABNORMAL LOW (ref 70–99)

## 2020-06-10 MED ORDER — SODIUM CHLORIDE 0.9 % IV SOLN
0.5000 mg/h | INTRAVENOUS | Status: DC
  Filled 2020-06-10: qty 5

## 2020-06-10 MED ORDER — INSULIN DETEMIR 100 UNIT/ML ~~LOC~~ SOLN
25.0000 [IU] | Freq: Two times a day (BID) | SUBCUTANEOUS | Status: DC
  Administered 2020-06-10 (×2): 25 [IU] via SUBCUTANEOUS
  Filled 2020-06-10 (×4): qty 0.25

## 2020-06-10 MED ORDER — FREE WATER
100.0000 mL | Freq: Three times a day (TID) | Status: DC
  Administered 2020-06-10 – 2020-06-14 (×11): 100 mL

## 2020-06-10 MED ORDER — DEXTROSE 50 % IV SOLN
INTRAVENOUS | Status: AC
  Administered 2020-06-10: 50 mL
  Filled 2020-06-10: qty 50

## 2020-06-10 MED ORDER — HYDROMORPHONE HCL 1 MG/ML IJ SOLN
0.5000 mg | Freq: Once | INTRAMUSCULAR | Status: DC
  Filled 2020-06-10: qty 0.5

## 2020-06-10 MED ORDER — FUROSEMIDE 10 MG/ML IJ SOLN
80.0000 mg | Freq: Two times a day (BID) | INTRAMUSCULAR | Status: DC
  Administered 2020-06-10 – 2020-06-11 (×2): 80 mg via INTRAVENOUS
  Filled 2020-06-10 (×2): qty 8

## 2020-06-10 MED ORDER — HYDROMORPHONE BOLUS VIA INFUSION
0.2000 mg | INTRAVENOUS | Status: DC | PRN
  Filled 2020-06-10: qty 1

## 2020-06-10 NOTE — Progress Notes (Signed)
ABG stable.[place] Progress Note Patient Name: [name] DOB: 15-Dec-1950 MRN: [id number]   Date of Service  06/10/2020  HPI/Events of Note  ABG stable type 2 failure. hco3 > 30  eICU Interventions  Continue same.     Intervention Category Minor Interventions: Other:  Elmer Sow 06/10/2020, 4:58 AM

## 2020-06-10 NOTE — Progress Notes (Signed)
Patient's daughter Rise Paganini updated by phone. The family is open to trach if it is medically indicated.    Otilio Carpen Savaughn Karwowski, PA-C

## 2020-06-10 NOTE — Progress Notes (Signed)
Patient placed in prone position.  ETT secured with cloth tape at 22cm at the lip.  Patient tolerated well with no complications.

## 2020-06-10 NOTE — Progress Notes (Signed)
NAME:  [name], MRN:  [id number], DOB:  April 08, 1951, LOS: 36 ADMISSION DATE:  04/28/2020, CONSULTATION DATE:  10/5 REFERRING MD: Memon/ Triad, CHIEF COMPLAINT:  resp distress    Brief History     37 yowf , with history of T2DM, OSA, Hothyroidism, HTN, HLD, hepatitis, and GERD presents to the ED with a chief complaint of dyspnea.  Patient reports that symptom onset was 5 weeks ago, and started with diverticulitis.  Patient reports that since then she has had dyspnea and cough that started simultaneously.  The dyspnea and cough became worse over the last 2 weeks.  Patient reports that dyspnea is worse with exertion, and she can even walk now.  She reports that she fell on 9/29, which she attributes to being so weak.  She describes the fall as she was sitting in her chair got up took 2 steps and ended up on the ground.  She does not remember feeling lightheaded, or any preceding symptoms, and she did not hit her head.  Patient reports that she is weak because she has had decreased appetite and decreased p.o. intake since the diverticulitis 5 weeks pta.  Patient reports that her taste and smell are intact.  She does not have chest pain or palpitations.  She reports that her cough is a dry cough.  She had fevers for the first 3 weeks of her symptoms, but not for the last 2 weeks.  She reports that her T-max was 103.1, and Tylenol helps when she remembers to take it.  Patient has not had body aches, nausea vomiting, diarrhea, dysuria.  She does admit to shakiness, and vision changes over the last 2 weeks.  She reports that she cannot focus her eyes, so is blurry vision, but not double vision.  She does not have a sore throat and is not has clogged sinuses.  Patient did not get a Covid vaccine, because "I did not want 1."  Patient is refusing remdesivir.  Past Medical History  DM  II OSA Hypothyroid HBP HLD GERD   Significant Hospital Events   05/21/2020 - admit 05/30/20 - Sedated and prone . No  acute issues overnight received on dose of NMB last night and again this AM  10/19 -  05/31/2020: On fentanyl infusion, Versed infusion.  Off Levophed infusion and then back on.  On tube feeds.  On the ventilator FiO2 60%. Peep 12 -> pulse ox 95%. Afebrile.  Supine since 4am. PF ratio 201 10/17 continues on prone and supine ventilation. 10/19 Attempting to wean PEEP, family discussion regarding trach  Consults:  PCCM  10/5  Procedures:  Oral ET  10/5  L IJ CVL  10/5   Significant Diagnostic Tests:  CT Abd L large rectus sheath hematoma, COVID PNA on visible lung fields  Micro Data:  Covid 19  PCR  9/30  POS MRSA PCR  10/1 >   Neg   Blood 05/27/2020: Negative Tracheal aspirate 05/28/2020: Rare Candida blood culture Blood 06/01/2020 >> neg Tracheal aspirate 06/01/2020 >> few candida albicans  ID Rx:  Baricitinib 10/3 > 10/5 (stopped following abd pain and rise in LFT) Remdesivir [refused] Solu-Medrol 10/1  > (stop in 14 days total) Cefepime 10/10 >> 10/11  Interim history/subjective:  Supinated today, no overnight events  Objective   Blood pressure (!) 119/51, pulse 93, temperature 99.7 F (37.6 C), resp. rate (!) 35, height 5\' 1"  (1.549 m), weight 86.6 kg, SpO2 92 %.    Vent Mode:  PRVC FiO2 (%):  [60 %-70 %] 70 % Set Rate:  [35 bmp] 35 bmp Vt Set:  [350 mL] 350 mL PEEP:  [18 cmH20] 18 cmH20 Plateau Pressure:  [29 cmH20-35 cmH20] 35 cmH20   Intake/Output Summary (Last 24 hours) at 06/10/2020 1962 Last data filed at 06/10/2020 0900 Gross per 24 hour  Intake 2118.75 ml  Output 2320 ml  Net -201.25 ml   Filed Weights   06/07/20 0405 06/09/20 0425 06/10/20 0343  Weight: 83.5 kg 77.3 kg 86.6 kg   General:  Critically ill, sedated F on ventilator support HEENT: MM pink/moist, ETT in place Neuro: sedated on fentanyl and versed CV: s1s2 rrr, no m/r/g PULM:  Lungs without wheezing or rhonchi, on full vent support, no vent dyssynchrony  GI: soft, bsx4 active   Extremities: warm/dry, 2+ edema upper and lower extremities Skin: no rashes or lesions   Resolved Hospital Problem list    Hyperkalemia- resolved  Assessment & Plan:    Acute hypoxic/hypercapnic respiratory failure and sepsis (POA) due to severe ARDS from COVID19 pneumonia Day 14 with ETT, family discussion regarding trach initiated yesterday, not vaccinated, refused Remdesevir P: -attempt to wean PEEP today in anticipation of needing trach, repeat ABG this afternoon -Maintain full vent support with SAT/SBT as tolerated -ARDSnet ventilation with 6cc/kg and goal driving pressure <22 -titrate Vent setting to maintain SpO2 greater than or equal to 85%. -HOB elevated 30 degrees. -Plateau pressures less than 30 cm H20.  -Follow chest x-ray, ABG prn.   -Bronchial hygiene and RT/bronchodilator protocol. -proning holiday today secondary to facial and upper body edema -RASS goal -2, add hydromorphone to try to wean off Fentanyl       Concern for sepsis, Fever curve improving, no pressor requirement, monitor and re-culture if shows signs of sepsis   Acute on chronic renal failure  - thought to be ATN on baseline CKD Currently no indications for dialysis.  Creatinine continues to improve with good UOP P: -Good response to Lasix, continue to monitor UOP and renal indices -repeat Lasix 80mg  today    ABLA secondary to Rectus sheath hematoma seen on CT 10/6-no signs of active bleeding -continue to monitor Hgb and transfuse for <7     Daily Goals Checklist  Pain/Anxiety/Delirium protocol (if indicated): Fentanyl/ versed gtt, enteral OxyIR/ klonopin VAP protocol (if indicated): Bundle in place Respiratory support goals: Continue on PRVC at current settings.  Continues to require prone ventilation. Blood pressure target: Not requiring vasopressors. Maintain MAP greater than 65. DVT prophylaxis: Unfractionated heparin for DVT prophylaxis Nutrition Status: EN GI prophylaxis:  Pantoprazole Fluid status goals:  Diurese today again Urinary catheter:  Central lines: Left subclavian central line Glucose control: Type 2 diabetes with stress hyperglycemia adequate control at present. Mobility/therapy needs: Bedrest Antibiotic de-escalation: monitor clinically  Home medication reconciliation: Continue home Synthroid Daily labs: CBC/ BMET Code Status: Full code Family Communication: pending 10/19 Disposition: ICU  LABS    PULMONARY Recent Labs  Lab 06/06/20 1059 06/07/20 0906 06/08/20 1647 06/09/20 1725 06/10/20 0430  PHART 7.359 7.279* 7.346* 7.315* 7.298*  PCO2ART 52.1* 67.3* 58.2* 67.0* 70.0*  PO2ART 49* 61* 47* 46* 57*  HCO3 29.4* 31.6* 31.6* 34.1* 34.1*  TCO2 31 34* 33* 36* 36*  O2SAT 83.0 87.0 78.0 75.0 84.0    CBC Recent Labs  Lab 06/08/20 0536 06/08/20 1647 06/09/20 0457 06/09/20 0457 06/09/20 1725 06/10/20 0333 06/10/20 0430  HGB 8.1*   < > 7.8*   < > 7.8* 7.4* 7.5*  HCT 26.8*   < > 25.8*   < > 23.0* 24.8* 22.0*  WBC 5.0  --  5.3  --   --  6.1  --   PLT 100*  --  114*  --   --  104*  --    < > = values in this interval not displayed.    COAGULATION No results for input(s): INR in the last 168 hours.  CARDIAC  No results for input(s): TROPONINI in the last 168 hours. No results for input(s): PROBNP in the last 168 hours.   CHEMISTRY Recent Labs  Lab 06/04/20 0552 06/05/20 0236 06/06/20 0405 06/06/20 1059 06/07/20 0356 06/07/20 0906 06/08/20 0536 06/08/20 0536 06/08/20 1647 06/08/20 1647 06/09/20 0758 06/09/20 0758 06/09/20 1725 06/09/20 1725 06/10/20 0333 06/10/20 0430  NA 145   < > 145   < > 143   < > 140   < > 137  --  138  --  137  --  138 138  K 5.8*   < > 5.4*   < > 5.1   < > 4.8   < > 4.7   < > 4.2   < > 4.9   < > 4.8 4.9  CL 110   < > 107  --  105  --  101  --   --   --  98  --   --   --  99  --   CO2 24   < > 27  --  28  --  30  --   --   --  31  --   --   --  29  --   GLUCOSE 195*   < > 158*  --  227*   --  198*  --   --   --  103*  --   --   --  192*  --   BUN 113*   < > 111*  --  109*  --  106*  --   --   --  97*  --   --   --  99*  --   CREATININE 2.64*   < > 2.08*  --  1.90*  --  1.70*  --   --   --  1.57*  --   --   --  1.58*  --   CALCIUM 8.5*   < > 9.1  --  8.8*  --  8.6*  --   --   --  8.7*  --   --   --  8.4*  --   PHOS 5.9*  --   --   --   --   --   --   --   --   --   --   --   --   --   --   --    < > = values in this interval not displayed.   Estimated Creatinine Clearance: 34.1 mL/min (A) (by C-G formula based on SCr of 1.58 mg/dL (H)).   LIVER Recent Labs  Lab 06/04/20 0552 06/05/20 0236 06/06/20 0405 06/07/20 0356  AST 17 22 17 15   ALT 17 22 20 20   ALKPHOS 53 60 53 59  BILITOT 0.5 0.6 0.6 0.5  PROT 4.8* 5.3* 5.3* 5.1*  ALBUMIN 1.2* 1.4* 1.4* 1.3*     INFECTIOUS Recent Labs  Lab 06/09/20 0918  PROCALCITON 1.48     ENDOCRINE CBG (last 3)  Recent Labs  06/09/20 2354 06/10/20 0359 06/10/20 0745  GLUCAP 147* 175* 171*   CRITICAL CARE Performed by: Otilio Carpen Ashford Clouse   Total critical care time: 40 minutes  Critical care time was exclusive of separately billable procedures and treating other patients.  Critical care was necessary to treat or prevent imminent or life-threatening deterioration.  Critical care was time spent personally by me on the following activities: development of treatment plan with patient and/or surrogate as well as nursing, discussions with consultants, evaluation of patient's response to treatment, examination of patient, obtaining history from patient or surrogate, ordering and performing treatments and interventions, ordering and review of laboratory studies, ordering and review of radiographic studies, pulse oximetry, re-evaluation of patient's condition and participation in multidisciplinary rounds.  Otilio Carpen Kilynn Fitzsimmons, PA-C [facility]  Pager# [phone], if no answer [phone]

## 2020-06-11 ENCOUNTER — Encounter (HOSPITAL_COMMUNITY): Payer: Medicare HMO

## 2020-06-11 DIAGNOSIS — J9601 Acute respiratory failure with hypoxia: Secondary | ICD-10-CM | POA: Diagnosis not present

## 2020-06-11 DIAGNOSIS — U071 COVID-19: Secondary | ICD-10-CM | POA: Diagnosis not present

## 2020-06-11 LAB — GLUCOSE, CAPILLARY
Glucose-Capillary: 112 mg/dL — ABNORMAL HIGH (ref 70–99)
Glucose-Capillary: 117 mg/dL — ABNORMAL HIGH (ref 70–99)
Glucose-Capillary: 164 mg/dL — ABNORMAL HIGH (ref 70–99)
Glucose-Capillary: 195 mg/dL — ABNORMAL HIGH (ref 70–99)
Glucose-Capillary: 225 mg/dL — ABNORMAL HIGH (ref 70–99)
Glucose-Capillary: 49 mg/dL — ABNORMAL LOW (ref 70–99)
Glucose-Capillary: 86 mg/dL (ref 70–99)

## 2020-06-11 LAB — POCT I-STAT 7, (LYTES, BLD GAS, ICA,H+H)
Acid-Base Excess: 10 mmol/L — ABNORMAL HIGH (ref 0.0–2.0)
Bicarbonate: 35.5 mmol/L — ABNORMAL HIGH (ref 20.0–28.0)
Calcium, Ion: 1.23 mmol/L (ref 1.15–1.40)
HCT: 23 % — ABNORMAL LOW (ref 36.0–46.0)
Hemoglobin: 7.8 g/dL — ABNORMAL LOW (ref 12.0–15.0)
O2 Saturation: 93 %
Potassium: 4.1 mmol/L (ref 3.5–5.1)
Sodium: 142 mmol/L (ref 135–145)
TCO2: 37 mmol/L — ABNORMAL HIGH (ref 22–32)
pCO2 arterial: 56.9 mmHg — ABNORMAL HIGH (ref 32.0–48.0)
pH, Arterial: 7.403 (ref 7.350–7.450)
pO2, Arterial: 69 mmHg — ABNORMAL LOW (ref 83.0–108.0)

## 2020-06-11 LAB — CBC WITH DIFFERENTIAL/PLATELET
Abs Immature Granulocytes: 0.12 10*3/uL — ABNORMAL HIGH (ref 0.00–0.07)
Basophils Absolute: 0 10*3/uL (ref 0.0–0.1)
Basophils Relative: 0 %
Eosinophils Absolute: 0.1 10*3/uL (ref 0.0–0.5)
Eosinophils Relative: 1 %
HCT: 23.8 % — ABNORMAL LOW (ref 36.0–46.0)
Hemoglobin: 7.2 g/dL — ABNORMAL LOW (ref 12.0–15.0)
Immature Granulocytes: 2 %
Lymphocytes Relative: 8 %
Lymphs Abs: 0.5 10*3/uL — ABNORMAL LOW (ref 0.7–4.0)
MCH: 29.4 pg (ref 26.0–34.0)
MCHC: 30.3 g/dL (ref 30.0–36.0)
MCV: 97.1 fL (ref 80.0–100.0)
Monocytes Absolute: 0.3 10*3/uL (ref 0.1–1.0)
Monocytes Relative: 4 %
Neutro Abs: 5.6 10*3/uL (ref 1.7–7.7)
Neutrophils Relative %: 85 %
Platelets: 122 10*3/uL — ABNORMAL LOW (ref 150–400)
RBC: 2.45 MIL/uL — ABNORMAL LOW (ref 3.87–5.11)
RDW: 14.9 % (ref 11.5–15.5)
WBC: 6.6 10*3/uL (ref 4.0–10.5)
nRBC: 0 % (ref 0.0–0.2)

## 2020-06-11 LAB — BASIC METABOLIC PANEL
Anion gap: 10 (ref 5–15)
BUN: 97 mg/dL — ABNORMAL HIGH (ref 8–23)
CO2: 31 mmol/L (ref 22–32)
Calcium: 8.7 mg/dL — ABNORMAL LOW (ref 8.9–10.3)
Chloride: 100 mmol/L (ref 98–111)
Creatinine, Ser: 1.55 mg/dL — ABNORMAL HIGH (ref 0.44–1.00)
GFR, Estimated: 34 mL/min — ABNORMAL LOW (ref >60)
Glucose, Bld: 65 mg/dL — ABNORMAL LOW (ref 70–99)
Potassium: 3.8 mmol/L (ref 3.5–5.1)
Sodium: 141 mmol/L (ref 135–145)

## 2020-06-11 MED ORDER — DEXTROSE 50 % IV SOLN
INTRAVENOUS | Status: AC
  Administered 2020-06-11: 50 mL
  Filled 2020-06-11: qty 50

## 2020-06-11 MED ORDER — INSULIN DETEMIR 100 UNIT/ML ~~LOC~~ SOLN
20.0000 [IU] | Freq: Two times a day (BID) | SUBCUTANEOUS | Status: DC
  Administered 2020-06-11 – 2020-06-14 (×6): 20 [IU] via SUBCUTANEOUS
  Filled 2020-06-11 (×9): qty 0.2

## 2020-06-11 MED ORDER — FUROSEMIDE 10 MG/ML IJ SOLN
8.0000 mg/h | INTRAVENOUS | Status: DC
  Administered 2020-06-11: 8 mg/h via INTRAVENOUS
  Administered 2020-06-12: 6 mg/h via INTRAVENOUS
  Administered 2020-06-13: 8 mg/h via INTRAVENOUS
  Filled 2020-06-11 (×4): qty 25

## 2020-06-11 MED ORDER — INSULIN ASPART 100 UNIT/ML ~~LOC~~ SOLN
5.0000 [IU] | SUBCUTANEOUS | Status: DC
  Administered 2020-06-11 – 2020-06-12 (×5): 5 [IU] via SUBCUTANEOUS

## 2020-06-11 MED ORDER — INSULIN ASPART 100 UNIT/ML ~~LOC~~ SOLN: 12.0000 [IU] | SUBCUTANEOUS | Status: DC

## 2020-06-11 NOTE — Procedures (Signed)
Cortrak  Person Inserting Tube:  Jacklynn Barnacle E, RD Tube Type:  Cortrak - 43 inches Tube Location:  Right nare Initial Placement:  Stomach Secured by: Clip Technique Used to Measure Tube Placement:  Documented cm marking at nare/ corner of mouth Cortrak Secured At:  70 cm    Cortrak Tube Team Note:  Consult received to place a Cortrak feeding tube.   No x-ray is required. RN may begin using tube.   Unable to place bridle after multiple attempts. Tube is secured with clips. Left bridle in bag in patient's room.  If the tube becomes dislodged please keep the tube and contact the Cortrak team at [email or web address] (password [email or web address]) for replacement.  If after hours and replacement cannot be delayed, place a NG tube and confirm placement with an abdominal x-ray.   Jacklynn Barnacle, MS, RD, LDN Pager number available on Amion

## 2020-06-11 NOTE — Progress Notes (Signed)
Inpatient Diabetes Program Recommendations  AACE/ADA: New Consensus Statement on Inpatient Glycemic Control (2015)  Target Ranges:  Prepandial:   less than 140 mg/dL      Peak postprandial:   less than 180 mg/dL (1-2 hours)      Critically ill patients:  140 - 180 mg/dL   Lab Results  Component Value Date   GLUCAP 112 (H) 06/11/2020   HGBA1C 9.1 (H) 05/15/2020    Review of Glycemic Control Results for ELOISE, PICONE (MRN [id number]) as of 06/11/2020 14:13  Ref. Range 06/11/2020 03:58 06/11/2020 04:44 06/11/2020 07:45 06/11/2020 11:24  Glucose-Capillary Latest Ref Range: 70 - 99 mg/dL 49 (L) 117 (H) 86 112 (H)   Diabetes history: DM 2 Outpatient Diabetes medications:  Levemir 30 units q AM and Levemir 35 units q PM Novolog 3-11 units tid with meals Current orders for Inpatient glycemic control:  Novolog resistant q 4 hours Novolog 12 units q 4 hours Levemir 20 units bid Vital 60 ml/hr  Inpatient Diabetes Program Recommendations:    Note blood sugar dropped overnight. Agree with reduction of Levemir.   Also please reduce Novolog tube feed coverage to 5 units q 4 hours.   Thanks,  Adah Perl, RN, BC-ADM Inpatient Diabetes Coordinator Pager [phone] (8a-5p)

## 2020-06-11 NOTE — Progress Notes (Signed)
RN attempted to call daughter, Rise Paganini, twice throughout the shift to give patient update.

## 2020-06-11 NOTE — Progress Notes (Addendum)
NAME:  [name], MRN:  [id number], DOB:  Jan 19, 1951, LOS: 36 ADMISSION DATE:  05/05/2020, CONSULTATION DATE:  10/5 REFERRING MD: Memon/ Triad, CHIEF COMPLAINT:  resp distress    Brief History     81 yowf , with history of T2DM, OSA, Hothyroidism, HTN, HLD, hepatitis, and GERD presents to the ED with a chief complaint of dyspnea.  Patient reports that symptom onset was 5 weeks ago, and started with diverticulitis.  Patient reports that since then she has had dyspnea and cough that started simultaneously.  The dyspnea and cough became worse over the last 2 weeks.  Patient reports that dyspnea is worse with exertion, and she can even walk now.  She reports that she fell on 9/29, which she attributes to being so weak.  She describes the fall as she was sitting in her chair got up took 2 steps and ended up on the ground.  She does not remember feeling lightheaded, or any preceding symptoms, and she did not hit her head.  Patient reports that she is weak because she has had decreased appetite and decreased p.o. intake since the diverticulitis 5 weeks pta.  Patient reports that her taste and smell are intact.  She does not have chest pain or palpitations.  She reports that her cough is a dry cough.  She had fevers for the first 3 weeks of her symptoms, but not for the last 2 weeks.  She reports that her T-max was 103.1, and Tylenol helps when she remembers to take it.  Patient has not had body aches, nausea vomiting, diarrhea, dysuria.  She does admit to shakiness, and vision changes over the last 2 weeks.  She reports that she cannot focus her eyes, so is blurry vision, but not double vision.  She does not have a sore throat and is not has clogged sinuses.  Patient did not get a Covid vaccine, because "I did not want 1."  Patient is refusing remdesivir.  Past Medical History  DM  II OSA Hypothyroid HBP HLD GERD   Significant Hospital Events   05/19/2020 - admit 05/30/20 - Sedated and prone . No  acute issues overnight received on dose of NMB last night and again this AM  10/19 -  05/31/2020: On fentanyl infusion, Versed infusion.  Off Levophed infusion and then back on.  On tube feeds.  On the ventilator FiO2 60%. Peep 12 -> pulse ox 95%. Afebrile.  Supine since 4am. PF ratio 201 10/17 continues on prone and supine ventilation. 10/19 Attempting to wean PEEP, family discussion regarding trach  Consults:  PCCM  10/5  Procedures:  Oral ET  10/5  L IJ CVL  10/5   Significant Diagnostic Tests:  CT Abd L large rectus sheath hematoma, COVID PNA on visible lung fields  Micro Data:  Covid 19  PCR  9/30  POS MRSA PCR  10/1 >   Neg   Blood 05/27/2020: Negative Tracheal aspirate 05/28/2020: Rare Candida blood culture Blood 06/01/2020 >> neg Tracheal aspirate 06/01/2020 >> few candida albicans  ID Rx:  Baricitinib 10/3 > 10/5 (stopped following abd pain and rise in LFT) Remdesivir [refused] Solu-Medrol 10/1  > (stop in 14 days total) Cefepime 10/10 >> 10/11  Interim history/subjective:  Required re-proning yesterday afternoon, no overnight events  Objective   Blood pressure (!) 107/48, pulse 83, temperature 98.2 F (36.8 C), resp. rate (!) 35, height 5\' 1"  (1.549 m), weight 84.4 kg, SpO2 91 %.  Vent Mode: PRVC FiO2 (%):  [70 %] 70 % Set Rate:  [35 bmp] 35 bmp Vt Set:  [350 mL-400 mL] 400 mL PEEP:  [16 cmH20-18 cmH20] 16 cmH20 Plateau Pressure:  [30 cmH20-40 cmH20] 36 cmH20   Intake/Output Summary (Last 24 hours) at 06/11/2020 0716 Last data filed at 06/11/2020 0700 Gross per 24 hour  Intake 1769.73 ml  Output 3410 ml  Net -1640.27 ml   Filed Weights   06/09/20 0425 06/10/20 0343 06/11/20 0342  Weight: 77.3 kg 86.6 kg 84.4 kg   General:  Critically ill, proned sedated F on ventilator support HEENT: MM pink/moist, ETT in place, facial edema Neuro: sedated on fentanyl and versed, unresponsive  CV: s1s2 rrr, no m/r/g PULM: on full vent support, no significant  wheezing/crackles, better air movement in the RML today GI: soft, bsx4 active  Extremities: warm/dry, 2+ edema upper and lower extremities Skin: no rashes or lesions   Resolved Hospital Problem list    Hyperkalemia- resolved  Assessment & Plan:   Acute hypoxic/hypercapnic respiratory failure and sepsis (POA) due to severe ARDS from COVID19 pneumonia Pt with improved gas today, but P/F ratio remains 99, worsens every time is supinated and is requiring high PEEP with peak pressures 30's.  Further family discussion per Dr. Lynetta Mare today P: -attempt to supine again today, repeat ABG this afternoon -Goal PEEP <10 to consider trach -Lasix gtt, ramians +10L since admission -Maintain full vent support with SAT/SBT as tolerated -ARDSnet ventilation with 6cc/kg and goal driving pressure <92 -titrate Vent setting to maintain SpO2 greater than or equal to 85%. -HOB elevated 30 degrees. -Plateau pressures less than 30 cm H20.  -Follow chest x-ray, ABG prn.   -Bronchial hygiene and RT/bronchodilator protocol. -proning holiday today secondary to facial and upper body edema -RASS goal -2, add hydromorphone to try to wean off Fentanyl     Concern for sepsis, Fever curve improving, no pressor requirement, monitor and re-culture if shows signs of sepsis   Acute on chronic renal failure  - thought to be ATN on baseline CKD Currently no indications for dialysis.  Creatinine continues to improve with good UOP P: -Renal function stable,  Lasix gtt today    ABLA secondary to Rectus sheath hematoma seen on CT 10/6-no signs of active bleeding -continue to monitor Hgb and transfuse for <7     Daily Goals Checklist  Pain/Anxiety/Delirium protocol (if indicated): Fentanyl/ versed gtt, enteral OxyIR/ klonopin VAP protocol (if indicated): Bundle in place Respiratory support goals: Continue on PRVC at current settings.  Continues to require prone ventilation. Blood pressure target: Not requiring  vasopressors. Maintain MAP greater than 65. DVT prophylaxis: Unfractionated heparin for DVT prophylaxis Nutrition Status: EN GI prophylaxis: Pantoprazole Fluid status goals:  Diurese today again Urinary catheter:  Central lines: Left subclavian central line Glucose control: Type 2 diabetes with stress hyperglycemia adequate control at present. Mobility/therapy needs: Bedrest Antibiotic de-escalation: monitor clinically  Home medication reconciliation: Continue home Synthroid Daily labs: CBC/ BMET Code Status: Full code Family Communication: pending 10/20 Disposition: ICU  LABS    PULMONARY Recent Labs  Lab 06/07/20 0906 06/08/20 1647 06/09/20 1725 06/10/20 0430 06/10/20 1533  PHART 7.279* 7.346* 7.315* 7.298* 7.294*  PCO2ART 67.3* 58.2* 67.0* 70.0* 70.6*  PO2ART 61* 47* 46* 57* 54*  HCO3 31.6* 31.6* 34.1* 34.1* 34.2*  TCO2 34* 33* 36* 36* 36*  O2SAT 87.0 78.0 75.0 84.0 82.0    CBC Recent Labs  Lab 06/09/20 0457 06/09/20 1725 06/10/20 0333 06/10/20 1194  06/10/20 0430 06/10/20 1533 06/11/20 0334  HGB 7.8*   < > 7.4*   < > 7.5* 12.9 7.2*  HCT 25.8*   < > 24.8*   < > 22.0* 38.0 23.8*  WBC 5.3  --  6.1  --   --   --  6.6  PLT 114*  --  104*  --   --   --  122*   < > = values in this interval not displayed.    COAGULATION No results for input(s): INR in the last 168 hours.  CARDIAC  No results for input(s): TROPONINI in the last 168 hours. No results for input(s): PROBNP in the last 168 hours.   CHEMISTRY Recent Labs  Lab 06/07/20 0356 06/07/20 0906 06/08/20 0536 06/08/20 1647 06/09/20 0758 06/09/20 0758 06/09/20 1725 06/09/20 1725 06/10/20 0333 06/10/20 0333 06/10/20 0430 06/10/20 0430 06/10/20 1533 06/11/20 0334  NA 143   < > 140   < > 138   < > 137  --  138  --  138  --  139 141  K 5.1   < > 4.8   < > 4.2   < > 4.9   < > 4.8   < > 4.9   < > 4.4 3.8  CL 105  --  101  --  98  --   --   --  99  --   --   --   --  100  CO2 28  --  30  --  31   --   --   --  29  --   --   --   --  31  GLUCOSE 227*  --  198*  --  103*  --   --   --  192*  --   --   --   --  65*  BUN 109*  --  106*  --  97*  --   --   --  99*  --   --   --   --  97*  CREATININE 1.90*  --  1.70*  --  1.57*  --   --   --  1.58*  --   --   --   --  1.55*  CALCIUM 8.8*  --  8.6*  --  8.7*  --   --   --  8.4*  --   --   --   --  8.7*   < > = values in this interval not displayed.   Estimated Creatinine Clearance: 34.2 mL/min (A) (by C-G formula based on SCr of 1.55 mg/dL (H)).   LIVER Recent Labs  Lab 06/05/20 0236 06/06/20 0405 06/07/20 0356  AST 22 17 15   ALT 22 20 20   ALKPHOS 60 53 59  BILITOT 0.6 0.6 0.5  PROT 5.3* 5.3* 5.1*  ALBUMIN 1.4* 1.4* 1.3*     INFECTIOUS Recent Labs  Lab 06/09/20 0918  PROCALCITON 1.48     ENDOCRINE CBG (last 3)  Recent Labs    06/10/20 2339 06/11/20 0358 06/11/20 0444  GLUCAP 46* 49* 117*   CRITICAL CARE Performed by: Otilio Carpen Jahmire Ruffins   Total critical care time: 35 minutes  Critical care time was exclusive of separately billable procedures and treating other patients.  Critical care was necessary to treat or prevent imminent or life-threatening deterioration.  Critical care was time spent personally by me on the following activities: development of treatment plan with patient and/or surrogate as  well as nursing, discussions with consultants, evaluation of patient's response to treatment, examination of patient, obtaining history from patient or surrogate, ordering and performing treatments and interventions, ordering and review of laboratory studies, ordering and review of radiographic studies, pulse oximetry, re-evaluation of patient's condition and participation in multidisciplinary rounds.  Otilio Carpen Kaniya Trueheart, PA-C [facility]  Pager# [phone], if no answer [phone]

## 2020-06-12 ENCOUNTER — Inpatient Hospital Stay (HOSPITAL_COMMUNITY): Payer: Medicare HMO

## 2020-06-12 DIAGNOSIS — R609 Edema, unspecified: Secondary | ICD-10-CM | POA: Diagnosis not present

## 2020-06-12 DIAGNOSIS — J9690 Respiratory failure, unspecified, unspecified whether with hypoxia or hypercapnia: Secondary | ICD-10-CM | POA: Diagnosis not present

## 2020-06-12 DIAGNOSIS — J9601 Acute respiratory failure with hypoxia: Secondary | ICD-10-CM | POA: Diagnosis not present

## 2020-06-12 DIAGNOSIS — U071 COVID-19: Secondary | ICD-10-CM | POA: Diagnosis not present

## 2020-06-12 LAB — POCT I-STAT 7, (LYTES, BLD GAS, ICA,H+H)
Acid-Base Excess: 10 mmol/L — ABNORMAL HIGH (ref 0.0–2.0)
Acid-Base Excess: 9 mmol/L — ABNORMAL HIGH (ref 0.0–2.0)
Bicarbonate: 36.1 mmol/L — ABNORMAL HIGH (ref 20.0–28.0)
Bicarbonate: 34.1 mmol/L — ABNORMAL HIGH (ref 20.0–28.0)
Calcium, Ion: 1.18 mmol/L (ref 1.15–1.40)
Calcium, Ion: 1.2 mmol/L (ref 1.15–1.40)
HCT: 31 % — ABNORMAL LOW (ref 36.0–46.0)
HCT: 21 % — ABNORMAL LOW (ref 36.0–46.0)
Hemoglobin: 10.5 g/dL — ABNORMAL LOW (ref 12.0–15.0)
Hemoglobin: 7.1 g/dL — ABNORMAL LOW (ref 12.0–15.0)
O2 Saturation: 92 %
O2 Saturation: 86 %
Patient temperature: 100.6
Patient temperature: 100.9
Potassium: 3.3 mmol/L — ABNORMAL LOW (ref 3.5–5.1)
Potassium: 4.6 mmol/L (ref 3.5–5.1)
Sodium: 142 mmol/L (ref 135–145)
Sodium: 143 mmol/L (ref 135–145)
TCO2: 38 mmol/L — ABNORMAL HIGH (ref 22–32)
TCO2: 36 mmol/L — ABNORMAL HIGH (ref 22–32)
pCO2 arterial: 61.7 mmHg — ABNORMAL HIGH (ref 32.0–48.0)
pCO2 arterial: 56.6 mmHg — ABNORMAL HIGH (ref 32.0–48.0)
pH, Arterial: 7.38 (ref 7.350–7.450)
pH, Arterial: 7.393 (ref 7.350–7.450)
pO2, Arterial: 70 mmHg — ABNORMAL LOW (ref 83.0–108.0)
pO2, Arterial: 57 mmHg — ABNORMAL LOW (ref 83.0–108.0)

## 2020-06-12 LAB — GLUCOSE, CAPILLARY
Glucose-Capillary: 138 mg/dL — ABNORMAL HIGH (ref 70–99)
Glucose-Capillary: 167 mg/dL — ABNORMAL HIGH (ref 70–99)
Glucose-Capillary: 228 mg/dL — ABNORMAL HIGH (ref 70–99)
Glucose-Capillary: 233 mg/dL — ABNORMAL HIGH (ref 70–99)
Glucose-Capillary: 237 mg/dL — ABNORMAL HIGH (ref 70–99)
Glucose-Capillary: 253 mg/dL — ABNORMAL HIGH (ref 70–99)

## 2020-06-12 LAB — CBC
HCT: 23.5 % — ABNORMAL LOW (ref 36.0–46.0)
Hemoglobin: 7.2 g/dL — ABNORMAL LOW (ref 12.0–15.0)
MCH: 29.6 pg (ref 26.0–34.0)
MCHC: 30.6 g/dL (ref 30.0–36.0)
MCV: 96.7 fL (ref 80.0–100.0)
Platelets: 135 10*3/uL — ABNORMAL LOW (ref 150–400)
RBC: 2.43 MIL/uL — ABNORMAL LOW (ref 3.87–5.11)
RDW: 15.3 % (ref 11.5–15.5)
WBC: 5.5 10*3/uL (ref 4.0–10.5)
nRBC: 0 % (ref 0.0–0.2)

## 2020-06-12 LAB — BASIC METABOLIC PANEL
Anion gap: 11 (ref 5–15)
BUN: 95 mg/dL — ABNORMAL HIGH (ref 8–23)
CO2: 32 mmol/L (ref 22–32)
Calcium: 8.3 mg/dL — ABNORMAL LOW (ref 8.9–10.3)
Chloride: 100 mmol/L (ref 98–111)
Creatinine, Ser: 1.59 mg/dL — ABNORMAL HIGH (ref 0.44–1.00)
GFR, Estimated: 33 mL/min — ABNORMAL LOW (ref >60)
Glucose, Bld: 245 mg/dL — ABNORMAL HIGH (ref 70–99)
Potassium: 3.2 mmol/L — ABNORMAL LOW (ref 3.5–5.1)
Sodium: 143 mmol/L (ref 135–145)

## 2020-06-12 LAB — MAGNESIUM: Magnesium: 1.6 mg/dL — ABNORMAL LOW (ref 1.7–2.4)

## 2020-06-12 LAB — PHOSPHORUS: Phosphorus: 4.3 mg/dL (ref 2.5–4.6)

## 2020-06-12 MED ORDER — MAGNESIUM SULFATE 4 GM/100ML IV SOLN
4.0000 g | Freq: Once | INTRAVENOUS | Status: AC
  Administered 2020-06-12: 4 g via INTRAVENOUS
  Filled 2020-06-12: qty 100

## 2020-06-12 MED ORDER — POTASSIUM CHLORIDE 20 MEQ/15ML (10%) PO SOLN
20.0000 meq | ORAL | Status: AC
  Administered 2020-06-12 (×2): 20 meq
  Filled 2020-06-12 (×2): qty 15

## 2020-06-12 MED ORDER — CLONAZEPAM 0.5 MG PO TBDP
2.0000 mg | ORAL_TABLET | Freq: Two times a day (BID) | ORAL | Status: DC
  Administered 2020-06-12 – 2020-06-14 (×4): 2 mg
  Filled 2020-06-12 (×5): qty 4

## 2020-06-12 MED ORDER — INSULIN ASPART 100 UNIT/ML ~~LOC~~ SOLN
12.0000 [IU] | SUBCUTANEOUS | Status: DC
  Administered 2020-06-12 – 2020-06-14 (×8): 12 [IU] via SUBCUTANEOUS

## 2020-06-12 MED ORDER — POTASSIUM CHLORIDE 10 MEQ/50ML IV SOLN
10.0000 meq | INTRAVENOUS | Status: AC
  Administered 2020-06-12 (×4): 10 meq via INTRAVENOUS
  Filled 2020-06-12 (×4): qty 50

## 2020-06-12 MED ORDER — QUETIAPINE FUMARATE 50 MG PO TABS
50.0000 mg | ORAL_TABLET | Freq: Two times a day (BID) | ORAL | Status: DC
  Administered 2020-06-12 – 2020-06-14 (×4): 50 mg
  Filled 2020-06-12 (×5): qty 1

## 2020-06-12 NOTE — Progress Notes (Signed)
NAME:  [name], MRN:  [id number], DOB:  09-03-1950, LOS: 21 ADMISSION DATE:  05/21/2020, CONSULTATION DATE:  10/5 REFERRING MD: Memon/ Triad, CHIEF COMPLAINT:  resp distress    Brief History     34 yowf , with history of T2DM, OSA, Hothyroidism, HTN, HLD, hepatitis, and GERD presents to the ED with a chief complaint of dyspnea.  Patient reports that symptom onset was 5 weeks ago, and started with diverticulitis.  Patient reports that since then she has had dyspnea and cough that started simultaneously.  The dyspnea and cough became worse over the last 2 weeks.  Patient reports that dyspnea is worse with exertion, and she can even walk now.  She reports that she fell on 9/29, which she attributes to being so weak.  She describes the fall as she was sitting in her chair got up took 2 steps and ended up on the ground.  She does not remember feeling lightheaded, or any preceding symptoms, and she did not hit her head.  Patient reports that she is weak because she has had decreased appetite and decreased p.o. intake since the diverticulitis 5 weeks pta.  Patient reports that her taste and smell are intact.  She does not have chest pain or palpitations.  She reports that her cough is a dry cough.  She had fevers for the first 3 weeks of her symptoms, but not for the last 2 weeks.  She reports that her T-max was 103.1, and Tylenol helps when she remembers to take it.  Patient has not had body aches, nausea vomiting, diarrhea, dysuria.  She does admit to shakiness, and vision changes over the last 2 weeks.  She reports that she cannot focus her eyes, so is blurry vision, but not double vision.  She does not have a sore throat and is not has clogged sinuses.  Patient did not get a Covid vaccine, because "I did not want 1."  Patient is refusing remdesivir.  Past Medical History  DM  II OSA Hypothyroid HBP HLD GERD   Significant Hospital Events   04/25/2020 - admit 05/30/20 - Sedated and prone . No  acute issues overnight received on dose of NMB last night and again this AM  10/19 -  05/31/2020: On fentanyl infusion, Versed infusion.  Off Levophed infusion and then back on.  On tube feeds.  On the ventilator FiO2 60%. Peep 12 -> pulse ox 95%. Afebrile.  Supine since 4am. PF ratio 201 10/17 continues on prone and supine ventilation. 10/19 Attempting to wean PEEP, family discussion regarding trach  Consults:  PCCM  10/5  Procedures:  Oral ET  10/5  L IJ CVL  10/5   Significant Diagnostic Tests:  CT Abd L large rectus sheath hematoma, COVID PNA on visible lung fields  Micro Data:  Covid 19  PCR  9/30  POS MRSA PCR  10/1 >   Neg   Blood 05/27/2020: Negative Tracheal aspirate 05/28/2020: Rare Candida blood culture Blood 06/01/2020 >> neg Tracheal aspirate 06/01/2020 >> few candida albicans  ID Rx:  Baricitinib 10/3 > 10/5 (stopped following abd pain and rise in LFT) Remdesivir [refused] Solu-Medrol 10/1  > (stop in 14 days total) Cefepime 10/10 >> 10/11  Interim history/subjective:  Pt supinated 24hrs, ABG without significantly worsening pCo2 today, though no significant progress in terms of ventilator weaning, pt remains in severe ARDS with  P/F ratio of 88.    Objective   Blood pressure (!) 101/49, pulse  97, temperature 100.2 F (37.9 C), resp. rate (!) 21, height 5\' 1"  (1.549 m), weight 85.6 kg, SpO2 94 %.    Vent Mode: PRVC FiO2 (%):  [65 %-80 %] 80 % Set Rate:  [35 bmp] 35 bmp Vt Set:  [400 mL] 400 mL PEEP:  [16 cmH20] 16 cmH20 Plateau Pressure:  [31 cmH20-32 cmH20] 32 cmH20   Intake/Output Summary (Last 24 hours) at 06/12/2020 1003 Last data filed at 06/12/2020 0947 Gross per 24 hour  Intake 1560.23 ml  Output 4020 ml  Net -2459.77 ml   Filed Weights   06/10/20 0343 06/11/20 0342 06/12/20 0422  Weight: 86.6 kg 84.4 kg 85.6 kg   General:  Critically ill, supinated sedated F on ventilator support HEENT: MM pink/moist, ETT in place, facial edema Neuro: sedated  on fentanyl and versed, unresponsive, PERRLA, minimal gag CV: s1s2 rrr, no m/r/g PULM: synchronous with vent, diffusely decreased breath sounds anteriorly, no rhonchi or wheezing GI: soft, bsx4 active  Extremities: warm/dry, 2+ edema upper and lower extremities Skin: no rashes or lesions   Resolved Hospital Problem list    Hyperkalemia- resolved  Assessment & Plan:   Acute hypoxic/hypercapnic respiratory failure and sepsis (POA) due to severe ARDS from COVID19 pneumonia Requiring ~2 weeks proning with continued high FiO2 and PEEP requirements, unable to decrease PEEP from 16 to 14 without de-saturations today.   Sedation weaning resulted in double stacking and vent dyssynchrony P: -Trial ARPV mode then ABG this afternoon, may need re-proned -Given prolonged hospitalization and ventilator dependence without signs of significant lung recovery will consult palliative care to assist with [place] discussions with family ,  -Goal PEEP <10 to consider trach -Lasix gtt effective, out 3L, decrease from 10mg /hr to 6mg /hr today -Maintain full vent support with SAT/SBT as tolerated -ARDSnet ventilation with 6cc/kg and goal driving pressure <57 -titrate Vent setting to maintain SpO2 greater than or equal to 85%. -HOB elevated 30 degrees. -Plateau pressures less than 30 cm H20.  -Follow chest x-ray, ABG prn.   -Bronchial hygiene and RT/bronchodilator protocol. -Klonopin increase from 1mg  to 2mg , Seroquel from 25mg  to 50mg  in attempt to decrease Fentanyl/Versed   Fever Continued intermittent low grade fever without hypotension or leukocytosis P: -re-culture tracheal aspirate   Hypokalemia Likely secondary to Lasix P: Repleted, follow BMET    Acute on chronic renal failure  - thought to be ATN on baseline CKD Currently no indications for dialysis.  Creatinine continues to improve with good UOP P: -Renal function stable,  Lasix gtt today    ABLA secondary to Rectus sheath hematoma seen on  CT 10/6-no signs of active bleeding -continue to monitor Hgb and transfuse for <7     Daily Goals Checklist  Pain/Anxiety/Delirium protocol (if indicated): Fentanyl/ versed gtt, enteral OxyIR/ klonopin VAP protocol (if indicated): Bundle in place Respiratory support goals: Continue on PRVC at current settings.  Continues to require prone ventilation. Blood pressure target: Not requiring vasopressors. Maintain MAP greater than 65. DVT prophylaxis: Unfractionated heparin for DVT prophylaxis Nutrition Status: EN GI prophylaxis: Pantoprazole Fluid status goals:  Diurese today again Urinary catheter:  Central lines: Left subclavian central line Glucose control: Type 2 diabetes with stress hyperglycemia adequate control at present. Mobility/therapy needs: Bedrest Antibiotic de-escalation: monitor clinically  Home medication reconciliation: Continue home Synthroid Daily labs: CBC/ BMET Code Status: Full code Family Communication: PT's daughter Rise Paganini updated 10/21 Disposition: ICU  LABS    PULMONARY Recent Labs  Lab 06/09/20 1725 06/10/20 0430 06/10/20 1533 06/11/20 0813  06/12/20 0457  PHART 7.315* 7.298* 7.294* 7.403 7.380  PCO2ART 67.0* 70.0* 70.6* 56.9* 61.7*  PO2ART 46* 57* 54* 69* 70*  HCO3 34.1* 34.1* 34.2* 35.5* 36.1*  TCO2 36* 36* 36* 37* 38*  O2SAT 75.0 84.0 82.0 93.0 92.0    CBC Recent Labs  Lab 06/10/20 0333 06/10/20 0430 06/11/20 0334 06/11/20 0334 06/11/20 0813 06/12/20 0457 06/12/20 0537  HGB 7.4*   < > 7.2*   < > 7.8* 10.5* 7.2*  HCT 24.8*   < > 23.8*   < > 23.0* 31.0* 23.5*  WBC 6.1  --  6.6  --   --   --  5.5  PLT 104*  --  122*  --   --   --  135*   < > = values in this interval not displayed.    COAGULATION No results for input(s): INR in the last 168 hours.  CARDIAC  No results for input(s): TROPONINI in the last 168 hours. No results for input(s): PROBNP in the last 168 hours.   CHEMISTRY Recent Labs  Lab 06/08/20 0536  06/08/20 1647 06/09/20 0758 06/09/20 1725 06/10/20 0333 06/10/20 0430 06/10/20 1533 06/10/20 1533 06/11/20 0334 06/11/20 0334 06/11/20 0813 06/11/20 0813 06/12/20 0457 06/12/20 0537  NA 140   < > 138   < > 138   < > 139  --  141  --  142  --  142 143  K 4.8   < > 4.2   < > 4.8   < > 4.4   < > 3.8   < > 4.1   < > 3.3* 3.2*  CL 101  --  98  --  99  --   --   --  100  --   --   --   --  100  CO2 30  --  31  --  29  --   --   --  31  --   --   --   --  32  GLUCOSE 198*  --  103*  --  192*  --   --   --  65*  --   --   --   --  245*  BUN 106*  --  97*  --  99*  --   --   --  97*  --   --   --   --  95*  CREATININE 1.70*  --  1.57*  --  1.58*  --   --   --  1.55*  --   --   --   --  1.59*  CALCIUM 8.6*  --  8.7*  --  8.4*  --   --   --  8.7*  --   --   --   --  8.3*  MG  --   --   --   --   --   --   --   --   --   --   --   --   --  1.6*  PHOS  --   --   --   --   --   --   --   --   --   --   --   --   --  4.3   < > = values in this interval not displayed.   Estimated Creatinine Clearance: 33.6 mL/min (A) (by C-G formula based on SCr of 1.59 mg/dL (H)).   LIVER Recent  Labs  Lab 06/06/20 0405 06/07/20 0356  AST 17 15  ALT 20 20  ALKPHOS 53 59  BILITOT 0.6 0.5  PROT 5.3* 5.1*  ALBUMIN 1.4* 1.3*     INFECTIOUS Recent Labs  Lab 06/09/20 0918  PROCALCITON 1.48     ENDOCRINE CBG (last 3)  Recent Labs    06/11/20 2336 06/12/20 0407 06/12/20 0721  GLUCAP 225* 233* 228*   CRITICAL CARE Performed by: Otilio Carpen Hema Lanza   Total critical care time: 33 minutes  Critical care time was exclusive of separately billable procedures and treating other patients.  Critical care was necessary to treat or prevent imminent or life-threatening deterioration.  Critical care was time spent personally by me on the following activities: development of treatment plan with patient and/or surrogate as well as nursing, discussions with consultants, evaluation of patient's response to  treatment, examination of patient, obtaining history from patient or surrogate, ordering and performing treatments and interventions, ordering and review of laboratory studies, ordering and review of radiographic studies, pulse oximetry, re-evaluation of patient's condition and participation in multidisciplinary rounds.  Otilio Carpen Bekah Igoe, PA-C [facility]  Pager# [phone], if no answer [phone]

## 2020-06-12 NOTE — Progress Notes (Signed)
RT NOTES: Patient's ET tube taped with cloth tape at 23 at the lip to prepare for proning. Assisted nursing in placing patient in proned position.

## 2020-06-12 NOTE — Progress Notes (Signed)
Nutrition Follow-up   DOCUMENTATION CODES:   Not applicable  INTERVENTION:   Tube feeding via cortrak tube (gastric): Vital 1.5 @ 60 ml/h (1440 ml per day) Prosource TF 90 ml BID  Provides 2320 kcal, 141 gm protein, 1100 ml free water daily  100 ml free water every 8 hours  Total free water: 1400 ml   NUTRITION DIAGNOSIS:   Inadequate oral intake related to inability to eat as evidenced by NPO status.  Ongoing   GOAL:   Patient will meet greater than or equal to 90% of their needs  Met.   MONITOR:   Weight trends, Vent status, Labs, I & O's, TF tolerance  REASON FOR ASSESSMENT:   Consult Enteral/tube feeding initiation and management  ASSESSMENT:   69 yo female admitted to [facility] on 9/30 with 2 week hx of worsening dyspnea, cough, weakness, and decreased appetite. COVID positive. Intubated 10/5 and transferred to [facility] for treatment of COVID PNA. PMH includes DM-2, glomerulonephritis, OSA, hypothyroidism, HLD, hepatitis, GERD, diverticulitis.  Per MD pt continues to require prone positioning. Pt with ++ facial swelling  10/8 prone  10/20 Cortrak placed, tip gastric. Bridle unable to be placed due to facial swelling.   Patient is currently intubated on ventilator support. MV: 15.9 L/min Temp (24hrs), Avg:100.2 F (37.9 C), Min:98.6 F (37 C), Max:101.3 F (38.5 C)   Labs reviewed. Na 143, K+ 3.2, Magnesium: 1.6 CBG: 127-199  Medications reviewed and include senokot, colace, novolog 12 units every 4 hours, , levemir 20 units BID, tradjenta, miralax, KCl  Fentanyl Lasix 6 ml/hr Mag sulfate x 1 Versed   Admission weight 78.9 kg Current weight 85.4 kg I/O +6.9 L since admission, -2312 ml x 24 hr UOP 3385 ml x 24 h  Current TF:  Vital AF 1.2 @ 65 ml/hr Provides: 1872 kcal and 117 grams protein  Diet Order:   Diet Order            Diet NPO time specified  Diet effective now                 EDUCATION NEEDS:   No education needs have been  identified at this time  Skin:  Skin Assessment:  (skin tears)  Last BM:  625 ml -  Height:   Ht Readings from Last 1 Encounters:  05/30/20 '5\' 1"'  (1.549 m)    Weight:   Wt Readings from Last 1 Encounters:  06/12/20 85.6 kg    Ideal Body Weight:  47.7 kg  BMI:  Body mass index is 35.66 kg/m.  Estimated Nutritional Needs:   Kcal:  2175-2450  Protein:  115-140 grams  Fluid:  >/= 2 L/day   Lockie Pares., RD, LDN, CNSC See AMiON for contact information

## 2020-06-12 NOTE — Progress Notes (Signed)
Inpatient Diabetes Program Recommendations  AACE/ADA: New Consensus Statement on Inpatient Glycemic Control (2015)  Target Ranges:  Prepandial:   less than 140 mg/dL      Peak postprandial:   less than 180 mg/dL (1-2 hours)      Critically ill patients:  140 - 180 mg/dL   Lab Results  Component Value Date   GLUCAP 228 (H) 06/12/2020   HGBA1C 9.1 (H) 05/20/2020    Review of Glycemic Control Results for [name], [name] (MRN [id number]) as of 06/12/2020 11:05  Ref. Range 06/11/2020 19:22 06/11/2020 23:36 06/12/2020 04:07 06/12/2020 07:21  Glucose-Capillary Latest Ref Range: 70 - 99 mg/dL 195 (H) 225 (H) 233 (H) 228 (H)   Diabetes history: DM 2 Outpatient Diabetes medications:  Levemir 30 units q AM and Levemir 35 units q PM Novolog 3-11 units tid with meals Current orders for Inpatient glycemic control:  Novolog resistant q 4 hours Novolog 12 units q 4 hours Levemir 20 units bid Vital 60 ml/hr Inpatient Diabetes Program Recommendations:   Consider changing Novolog tube feed coverage to 8 units q 4 hours.   Thanks  Adah Perl, RN, BC-ADM Inpatient Diabetes Coordinator Pager [phone] (8a-5p)

## 2020-06-12 NOTE — Progress Notes (Signed)
Lower extremity venous bilateral study completed.   Please see CV Proc for preliminary results.   Azlaan Isidore, RDMS  

## 2020-06-12 NOTE — Progress Notes (Signed)
[facility] ADULT ICU REPLACEMENT PROTOCOL   The patient does apply for the [facility] Adult ICU Electrolyte Replacment Protocol based on the criteria listed below:   1. Is GFR >/= 30 ml/min? Yes.    Patient's GFR today is 33 2. Is SCr </= 2? Yes.   Patient's SCr is 1.59 ml/kg/hr 3. Did SCr increase >/= 0.5 in 24 hours? No. 4. Abnormal electrolyte(s): k 3.2 5. Ordered repletion with: protocol 6. If a panic level lab has been reported, has the CCM MD in charge been notified? No..   Physician:    Ronda Fairly A 06/12/2020 6:10 AM

## 2020-06-13 DIAGNOSIS — Z515 Encounter for palliative care: Secondary | ICD-10-CM

## 2020-06-13 DIAGNOSIS — Z7189 Other specified counseling: Secondary | ICD-10-CM | POA: Diagnosis not present

## 2020-06-13 DIAGNOSIS — U071 COVID-19: Secondary | ICD-10-CM

## 2020-06-13 LAB — URINALYSIS, ROUTINE W REFLEX MICROSCOPIC
Bilirubin Urine: NEGATIVE
Glucose, UA: NEGATIVE mg/dL
Ketones, ur: NEGATIVE mg/dL
Leukocytes,Ua: NEGATIVE
Nitrite: NEGATIVE
Protein, ur: 30 mg/dL — AB
Specific Gravity, Urine: 1.012 (ref 1.005–1.030)
pH: 5 (ref 5.0–8.0)

## 2020-06-13 LAB — POCT I-STAT 7, (LYTES, BLD GAS, ICA,H+H)
Acid-Base Excess: 13 mmol/L — ABNORMAL HIGH (ref 0.0–2.0)
Acid-Base Excess: 9 mmol/L — ABNORMAL HIGH (ref 0.0–2.0)
Bicarbonate: 39 mmol/L — ABNORMAL HIGH (ref 20.0–28.0)
Bicarbonate: 35.1 mmol/L — ABNORMAL HIGH (ref 20.0–28.0)
Calcium, Ion: 1.2 mmol/L (ref 1.15–1.40)
Calcium, Ion: 1.17 mmol/L (ref 1.15–1.40)
HCT: 20 % — ABNORMAL LOW (ref 36.0–46.0)
HCT: 20 % — ABNORMAL LOW (ref 36.0–46.0)
Hemoglobin: 6.8 g/dL — CL (ref 12.0–15.0)
Hemoglobin: 6.8 g/dL — CL (ref 12.0–15.0)
O2 Saturation: 99 %
O2 Saturation: 81 %
Potassium: 4.2 mmol/L (ref 3.5–5.1)
Potassium: 4.1 mmol/L (ref 3.5–5.1)
Sodium: 144 mmol/L (ref 135–145)
Sodium: 144 mmol/L (ref 135–145)
TCO2: 41 mmol/L — ABNORMAL HIGH (ref 22–32)
TCO2: 37 mmol/L — ABNORMAL HIGH (ref 22–32)
pCO2 arterial: 60.1 mmHg — ABNORMAL HIGH (ref 32.0–48.0)
pCO2 arterial: 58 mmHg — ABNORMAL HIGH (ref 32.0–48.0)
pH, Arterial: 7.42 (ref 7.350–7.450)
pH, Arterial: 7.39 (ref 7.350–7.450)
pO2, Arterial: 136 mmHg — ABNORMAL HIGH (ref 83.0–108.0)
pO2, Arterial: 47 mmHg — ABNORMAL LOW (ref 83.0–108.0)

## 2020-06-13 LAB — HEPATIC FUNCTION PANEL
ALT: 245 U/L — ABNORMAL HIGH (ref 0–44)
AST: 495 U/L — ABNORMAL HIGH (ref 15–41)
Albumin: <1 g/dL — ABNORMAL LOW (ref 3.5–5.0)
Alkaline Phosphatase: 96 U/L (ref 38–126)
Bilirubin, Direct: <0.1 mg/dL (ref 0.0–0.2)
Total Bilirubin: 0.5 mg/dL (ref 0.3–1.2)
Total Protein: 4.9 g/dL — ABNORMAL LOW (ref 6.5–8.1)

## 2020-06-13 LAB — CBC
HCT: 22.7 % — ABNORMAL LOW (ref 36.0–46.0)
HCT: 25.1 % — ABNORMAL LOW (ref 36.0–46.0)
Hemoglobin: 7 g/dL — ABNORMAL LOW (ref 12.0–15.0)
Hemoglobin: 7.7 g/dL — ABNORMAL LOW (ref 12.0–15.0)
MCH: 29.8 pg (ref 26.0–34.0)
MCH: 30 pg (ref 26.0–34.0)
MCHC: 30.8 g/dL (ref 30.0–36.0)
MCHC: 30.7 g/dL (ref 30.0–36.0)
MCV: 96.6 fL (ref 80.0–100.0)
MCV: 97.7 fL (ref 80.0–100.0)
Platelets: 127 10*3/uL — ABNORMAL LOW (ref 150–400)
Platelets: 106 10*3/uL — ABNORMAL LOW (ref 150–400)
RBC: 2.35 MIL/uL — ABNORMAL LOW (ref 3.87–5.11)
RBC: 2.57 MIL/uL — ABNORMAL LOW (ref 3.87–5.11)
RDW: 15.7 % — ABNORMAL HIGH (ref 11.5–15.5)
RDW: 16.2 % — ABNORMAL HIGH (ref 11.5–15.5)
WBC: 5.5 10*3/uL (ref 4.0–10.5)
WBC: 4.1 10*3/uL (ref 4.0–10.5)
nRBC: 0 % (ref 0.0–0.2)
nRBC: 0 % (ref 0.0–0.2)

## 2020-06-13 LAB — HEMOGLOBIN AND HEMATOCRIT, BLOOD
HCT: 21.2 % — ABNORMAL LOW (ref 36.0–46.0)
Hemoglobin: 6.5 g/dL — CL (ref 12.0–15.0)

## 2020-06-13 LAB — POCT I-STAT EG7
Acid-Base Excess: 9 mmol/L — ABNORMAL HIGH (ref 0.0–2.0)
Bicarbonate: 35.2 mmol/L — ABNORMAL HIGH (ref 20.0–28.0)
Calcium, Ion: 1.18 mmol/L (ref 1.15–1.40)
HCT: 21 % — ABNORMAL LOW (ref 36.0–46.0)
Hemoglobin: 7.1 g/dL — ABNORMAL LOW (ref 12.0–15.0)
O2 Saturation: 82 %
Potassium: 4 mmol/L (ref 3.5–5.1)
Sodium: 145 mmol/L (ref 135–145)
TCO2: 37 mmol/L — ABNORMAL HIGH (ref 22–32)
pCO2, Ven: 58.8 mmHg (ref 44.0–60.0)
pH, Ven: 7.384 (ref 7.250–7.430)
pO2, Ven: 49 mmHg — ABNORMAL HIGH (ref 32.0–45.0)

## 2020-06-13 LAB — BASIC METABOLIC PANEL
Anion gap: 12 (ref 5–15)
BUN: 98 mg/dL — ABNORMAL HIGH (ref 8–23)
CO2: 32 mmol/L (ref 22–32)
Calcium: 8.4 mg/dL — ABNORMAL LOW (ref 8.9–10.3)
Chloride: 102 mmol/L (ref 98–111)
Creatinine, Ser: 1.57 mg/dL — ABNORMAL HIGH (ref 0.44–1.00)
GFR, Estimated: 36 mL/min — ABNORMAL LOW (ref >60)
Glucose, Bld: 63 mg/dL — ABNORMAL LOW (ref 70–99)
Potassium: 3.9 mmol/L (ref 3.5–5.1)
Sodium: 146 mmol/L — ABNORMAL HIGH (ref 135–145)

## 2020-06-13 LAB — GLUCOSE, CAPILLARY
Glucose-Capillary: 117 mg/dL — ABNORMAL HIGH (ref 70–99)
Glucose-Capillary: 26 mg/dL — CL (ref 70–99)
Glucose-Capillary: 106 mg/dL — ABNORMAL HIGH (ref 70–99)
Glucose-Capillary: 40 mg/dL — CL (ref 70–99)
Glucose-Capillary: 174 mg/dL — ABNORMAL HIGH (ref 70–99)
Glucose-Capillary: 126 mg/dL — ABNORMAL HIGH (ref 70–99)
Glucose-Capillary: 147 mg/dL — ABNORMAL HIGH (ref 70–99)
Glucose-Capillary: 115 mg/dL — ABNORMAL HIGH (ref 70–99)

## 2020-06-13 LAB — PROCALCITONIN: Procalcitonin: 1.68 ng/mL

## 2020-06-13 LAB — PHOSPHORUS: Phosphorus: 3.7 mg/dL (ref 2.5–4.6)

## 2020-06-13 LAB — PREPARE RBC (CROSSMATCH)

## 2020-06-13 LAB — MAGNESIUM: Magnesium: 2.4 mg/dL (ref 1.7–2.4)

## 2020-06-13 MED ORDER — DEXTROSE 50 % IV SOLN
25.0000 g | Freq: Once | INTRAVENOUS | Status: AC
  Administered 2020-06-13: 25 g via INTRAVENOUS
  Filled 2020-06-13: qty 50

## 2020-06-13 MED ORDER — DEXTROSE 5 % IV SOLN: INTRAVENOUS | Status: DC

## 2020-06-13 MED ORDER — SODIUM CHLORIDE 0.9 % IV SOLN
2.0000 g | Freq: Two times a day (BID) | INTRAVENOUS | Status: DC
  Administered 2020-06-13 (×2): 2 g via INTRAVENOUS
  Filled 2020-06-13 (×2): qty 2

## 2020-06-13 MED ORDER — DEXTROSE 50 % IV SOLN
25.0000 g | Freq: Once | INTRAVENOUS | Status: AC
  Administered 2020-06-13: 25 g via INTRAVENOUS

## 2020-06-13 MED ORDER — SODIUM CHLORIDE 0.9% IV SOLUTION: Freq: Once | INTRAVENOUS | Status: AC

## 2020-06-13 NOTE — Progress Notes (Signed)
RT NOTE:  Pts ETT taped w/cloth tape and @ 23cm center lip. Foam pads placed on cheeks and upper lip. Rt assisted BOB4 with turning patient to prone position. Prone pillow placed under patients head.

## 2020-06-13 NOTE — Progress Notes (Addendum)
NAME:  [name], MRN:  [id number], DOB:  1950-10-22, LOS: 28 ADMISSION DATE:  05/11/2020, CONSULTATION DATE:  10/5 REFERRING MD: Memon/ Triad, CHIEF COMPLAINT:  resp distress    Brief History     12 yowf , with history of T2DM, OSA, Hothyroidism, HTN, HLD, hepatitis, and GERD presents to the ED with a chief complaint of dyspnea.  Patient reports that symptom onset was 5 weeks ago, and started with diverticulitis.  Patient reports that since then she has had dyspnea and cough that started simultaneously.  The dyspnea and cough became worse over the last 2 weeks.  Patient reports that dyspnea is worse with exertion, and she can even walk now.  She reports that she fell on 9/29, which she attributes to being so weak.  She describes the fall as she was sitting in her chair got up took 2 steps and ended up on the ground.  She does not remember feeling lightheaded, or any preceding symptoms, and she did not hit her head.  Patient reports that she is weak because she has had decreased appetite and decreased p.o. intake since the diverticulitis 5 weeks pta.  Patient reports that her taste and smell are intact.  She does not have chest pain or palpitations.  She reports that her cough is a dry cough.  She had fevers for the first 3 weeks of her symptoms, but not for the last 2 weeks.  She reports that her T-max was 103.1, and Tylenol helps when she remembers to take it.  Patient has not had body aches, nausea vomiting, diarrhea, dysuria.  She does admit to shakiness, and vision changes over the last 2 weeks.  She reports that she cannot focus her eyes, so is blurry vision, but not double vision.  She does not have a sore throat and is not has clogged sinuses.  Patient did not get a Covid vaccine, because "I did not want 1."  Patient is refusing remdesivir.  Past Medical History  DM  II OSA Hypothyroid HBP HLD GERD   Significant Hospital Events   05/17/2020 - admit 05/30/20 - Sedated and prone . No  acute issues overnight received on dose of NMB last night and again this AM  10/19 -  05/31/2020: On fentanyl infusion, Versed infusion.  Off Levophed infusion and then back on.  On tube feeds.  On the ventilator FiO2 60%. Peep 12 -> pulse ox 95%. Afebrile.  Supine since 4am. PF ratio 201 10/17 continues on prone and supine ventilation. 10/19 Attempting to wean PEEP, family discussion regarding trach 10/22 Back to PRVC>> dyssynchronous in APRV mode   Consults:  PCCM  10/5  Procedures:  Oral ET  10/5  L IJ CVL  10/5   Significant Diagnostic Tests:  CT Abd L large rectus sheath hematoma, COVID PNA on visible lung fields  Micro Data:  Covid 19  PCR  9/30  POS MRSA PCR  10/1 >   Neg   Blood 05/27/2020: Negative Tracheal aspirate 05/28/2020: Rare Candida blood culture Blood 06/01/2020 >> neg Tracheal aspirate 06/01/2020 >> few candida albicans Tracheal Aspirate 10/21>> GS Abundant G + rods, rare gram + cocci,rare yeast   ID Rx:  Baricitinib 10/3 > 10/5 (stopped following abd pain and rise in LFT) Remdesivir [refused] Solu-Medrol 10/1  > (stop in 14 days total) Cefepime 10/10 >> 10/11 Cefepime 10/22  Interim history/subjective:  + 10.8 L On Lasix gtt at 6 mg/ hr>> Net negative 250 cc last  24 T max 101.7, WBC 5.5 Sputum recultured 10/21>>  Switched to [facility] 10/22>> ABG pending BS low this am 2/2 displacement of feeding tube>> TF on hold CBG was 26 on 10/22 am>> D50 with recheck at 110 Na 146 Creatinine 1.57 HGB is 7, platelets are 127 ( ? If hemo dilutional) Fentanyl at 200/ Versed at 6  Objective   Blood pressure (!) 116/49, pulse 97, temperature (!) 100.6 F (38.1 C), resp. rate (!) 22, height 5\' 1"  (1.549 m), weight 82.5 kg, SpO2 98 %.    Vent Mode: Bi-Vent FiO2 (%):  [100 %] 100 % Set Rate:  [35 bmp] 35 bmp PEEP:  [0 cmH20] 0 cmH20   Intake/Output Summary (Last 24 hours) at 06/13/2020 1761 Last data filed at 06/13/2020 0800 Gross per 24 hour  Intake 1985.46 ml   Output 2340 ml  Net -354.54 ml   Filed Weights   06/11/20 0342 06/12/20 0422 06/13/20 0442  Weight: 84.4 kg 85.6 kg 82.5 kg   General:  Critically ill, supinated sedated F on ventilator support HEENT: MM pink/moist, ETT in place, Cor Trac is pulled out, TF are off, + facial edema Neuro: sedated on fentanyl and versed, unresponsive, PERRLA, minimal gag CV: s1s2 rrr, no m/r/g PULM: synchronous with vent, diffusely decreased breath sounds anteriorly, n+ rhonchi on L, No  wheezing GI: soft, bsx4 active , TF on hold Extremities: warm/dry, 2+ edema upper and lower extremities Skin: no rashes or lesions   Resolved Hospital Problem list    Hyperkalemia- resolved  Assessment & Plan:   Acute hypoxic/hypercapnic respiratory failure and sepsis (POA) due to severe ARDS from COVID19 pneumonia Requiring ~2 weeks proning with continued high FiO2 and PEEP requirements, unable to decrease PEEP from 16 to 14 without de-saturations today.   Sedation weaning resulted in double stacking and vent dyssynchrony P: - Will resume PRVC, Check ABG in 1 hour, proned -Given prolonged hospitalization and ventilator dependence without signs of significant lung recovery will consult palliative care to assist with [place] discussions with family ,  -Goal PEEP <10 to consider trach -Lasix gtt effective, out 3L, decrease to 6 mg 10/21only net negative 250 cc's -Maintain full vent support with SAT/SBT as tolerated -ARDSnet ventilation with 6cc/kg and goal driving pressure <60 -titrate Vent setting to maintain SpO2 greater than or equal to 85%. -HOB elevated 30 degrees. -Plateau pressures less than 30 cm H20.  -Follow chest x-ray, ABG prn.   -Bronchial hygiene and RT/bronchodilator protocol. -Klonopin increase from 1mg  to 2mg , Seroquel from 25mg  to 50mg  in attempt to decrease Fentanyl/Versed   Fever Continued intermittent low grade fever without hypotension or leukocytosis P: -re-culture tracheal aspirate - Will  re-start cefepime as febrile x 24 hours, consider re-culturing( Blood/ Urine) - Will check PCT   Hypokalemia Likely secondary to Lasix P: Repleted, follow BMET  Trend Mag  Hypoglycemia Most likely 2/2 TF on hold, but big drop Plan Hold long acting insulin for now Will check LFT's in am Consider adding destrose tto Maintenance IVF if Cor Trac is not replaced    Acute on chronic renal failure  - thought to be ATN on baseline CKD Currently no indications for dialysis.  Creatinine continues to improve with good UOP P: -Renal function stable,  Lasix gtt today    ABLA secondary to Rectus sheath hematoma seen on CT 10/6-no signs of active bleeding -continue to monitor Hgb and transfuse for <7     Daily Goals Checklist  Pain/Anxiety/Delirium protocol (if indicated): Fentanyl/ versed  gtt, enteral OxyIR/ klonopin VAP protocol (if indicated): Bundle in place Respiratory support goals: Continue on PRVC at current settings.  Continues to require prone ventilation. Blood pressure target: Not requiring vasopressors. Maintain MAP greater than 65. DVT prophylaxis: Unfractionated heparin for DVT prophylaxis Nutrition Status: EN GI prophylaxis: Pantoprazole Fluid status goals:  Diurese today again Urinary catheter:  Central lines: Left subclavian central line Glucose control: Type 2 diabetes with stress hyperglycemia adequate control at present. Mobility/therapy needs: Bedrest Antibiotic de-escalation: monitor clinically  Home medication reconciliation: Continue home Synthroid Daily labs: CBC/ BMET Code Status: Full code Family Communication: PT's daughter Rise Paganini updated 10/21 Disposition: ICU  LABS    PULMONARY Recent Labs  Lab 06/10/20 0430 06/10/20 1533 06/11/20 0813 06/12/20 0457 06/12/20 1403  PHART 7.298* 7.294* 7.403 7.380 7.393  PCO2ART 70.0* 70.6* 56.9* 61.7* 56.6*  PO2ART 57* 54* 69* 70* 57*  HCO3 34.1* 34.2* 35.5* 36.1* 34.1*  TCO2 36* 36* 37* 38* 36*  O2SAT  84.0 82.0 93.0 92.0 86.0    CBC Recent Labs  Lab 06/11/20 0334 06/11/20 0813 06/12/20 0537 06/12/20 1403 06/13/20 0536  HGB 7.2*   < > 7.2* 7.1* 7.0*  HCT 23.8*   < > 23.5* 21.0* 22.7*  WBC 6.6  --  5.5  --  5.5  PLT 122*  --  135*  --  127*   < > = values in this interval not displayed.    COAGULATION No results for input(s): INR in the last 168 hours.  CARDIAC  No results for input(s): TROPONINI in the last 168 hours. No results for input(s): PROBNP in the last 168 hours.   CHEMISTRY Recent Labs  Lab 06/09/20 0758 06/09/20 1725 06/10/20 0333 06/10/20 0430 06/11/20 0334 06/11/20 0334 06/11/20 0813 06/11/20 0813 06/12/20 0457 06/12/20 0457 06/12/20 0537 06/12/20 0537 06/12/20 1403 06/13/20 0536  NA 138   < > 138   < > 141   < > 142  --  142  --  143  --  143 146*  K 4.2   < > 4.8   < > 3.8   < > 4.1   < > 3.3*   < > 3.2*   < > 4.6 3.9  CL 98  --  99  --  100  --   --   --   --   --  100  --   --  102  CO2 31  --  29  --  31  --   --   --   --   --  32  --   --  32  GLUCOSE 103*  --  192*  --  65*  --   --   --   --   --  245*  --   --  63*  BUN 97*  --  99*  --  97*  --   --   --   --   --  95*  --   --  98*  CREATININE 1.57*  --  1.58*  --  1.55*  --   --   --   --   --  1.59*  --   --  1.57*  CALCIUM 8.7*  --  8.4*  --  8.7*  --   --   --   --   --  8.3*  --   --  8.4*  MG  --   --   --   --   --   --   --   --   --   --  1.6*  --   --  2.4  PHOS  --   --   --   --   --   --   --   --   --   --  4.3  --   --  3.7   < > = values in this interval not displayed.   Estimated Creatinine Clearance: 33.4 mL/min (A) (by C-G formula based on SCr of 1.57 mg/dL (H)).   LIVER Recent Labs  Lab 06/07/20 0356  AST 15  ALT 20  ALKPHOS 59  BILITOT 0.5  PROT 5.1*  ALBUMIN 1.3*     INFECTIOUS Recent Labs  Lab 06/09/20 0918  PROCALCITON 1.48     ENDOCRINE CBG (last 3)  Recent Labs    06/13/20 0401 06/13/20 0711 06/13/20 0738  GLUCAP 117* 26* 106*    CRITICAL CARE Performed by: Magdalen Spatz   Total critical care time: 40 minutes  Critical care time was exclusive of separately billable procedures and treating other patients.  Critical care was necessary to treat or prevent imminent or life-threatening deterioration.  Critical care was time spent personally by me on the following activities: development of treatment plan with patient and/or surrogate as well as nursing, discussions with consultants, evaluation of patient's response to treatment, examination of patient, obtaining history from patient or surrogate, ordering and performing treatments and interventions, ordering and review of laboratory studies, ordering and review of radiographic studies, pulse oximetry, re-evaluation of patient's condition and participation in multidisciplinary rounds.  Magdalen Spatz, MSN, AGACNP-BC [place] for personal pager PCCM on call pager [phone] 06/13/2020 9:26 AM

## 2020-06-13 NOTE — Progress Notes (Signed)
[facility] Progress Note Patient Name: [name] DOB: [birth year] MRN: [id number]   Date of Service  06/13/2020  HPI/Events of Note  Last H & H was 6.8/20.  eICU Interventions  CBC ordered.        Kerry Kass Mavis Fichera 06/13/2020, 10:32 PM

## 2020-06-13 NOTE — Progress Notes (Signed)
Pt supined at this time with no complications. Et secured in proper position with commercial tube holder. Purulent secretions from mouth. MD aware. No breakdown noted on skin. ABG to be collected per protocol

## 2020-06-13 NOTE — Consult Note (Addendum)
Palliative Medicine Inpatient Consult Note  Reason for consult:  Goals of Care "Covid-19 patient with minimal improvement 2 weeks on vent"  HPI:  Per intake H&P --> 84 yowf ,with history of T2DM, OSA, Hothyroidism, HTN, HLD, hepatitis, and GERD presents to the ED with a chief complaint of dyspnea.  Identified to be Covid pneumonia positive.  Has been ventilated for the past 2 weeks without significant improvement.  Palliative care was asked to get involved to speak to the patient's family about goals of care.  Clinical Assessment/Goals of Care: I have reviewed medical records including EPIC notes, labs and imaging, received report from bedside RN who shares that there have not been great changes despite proning of the patient assessed the patient who was intubated and sedated.    I called Beverly Hanks to further discuss diagnosis prognosis, GOC, EOL wishes, disposition and options.   I introduced Palliative Medicine as specialized medical care for people living with serious illness. It focuses on providing relief from the symptoms and stress of a serious illness. The goal is to improve quality of life for both the patient and the family.  I asked [name] to tell me a little bit more about her mother.  [address] lives in [place], [place].  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  She is married to her husband Annie Main.  They have been wed for the past 52 years.  They share a son and a daughter, 3 grandchildren, and one great grandchild.  In terms of things that are most important to [place] she is identified to be a family woman, placing the greatest joy on spending time with her family.  Patient is faithful in a member of the [employer] [facility] denomination.  Prior to hospital admission [name] was fully independent of all basic activities of daily living and instrumental activities of daily living.  A detailed discussion was had today regarding advanced directives -  there are none noted to be on record in [place].    Per discussion with [name] patient had never completed advanced directives.  Concepts specific to code status, artifical feeding and hydration, continued IV antibiotics and rehospitalization was had.    At this point in time [name] would like to speak to her father Annie Main, to get a better impression of what his thoughts are.  We talked about cardiopulmonary resuscitation and how at this juncture it is likely to cause far more burden than potential benefit likely prolonging the inevitable.    The difference between a aggressive medical intervention path  and a palliative comfort care path for this patient at this time was had.  I asked [name] how long she and her family think would be acceptable for [name] to be intubated if she were not making meaningful improvements.  I suggested having a conversation about this is a worry each day that she is intubated she is getting further and further from the likelihood of any hopeful recovery.  Really and I talked about best case scenario that there is improvement in lung function and potential extubation.  We also discussed worse case scenario which is no improvement possible tracheostomy and life in a long-term acute care facility.  We discussed quality of life over quantity of life.  [name] is clear that her mother would likely not want to sustain her days intubated in a long-term facility.  [name] shares that she has some feelings of frustration towards the medical team.  She goes on to  state that the patient was not offered monoclonal antibodies high-dose vitamin C or zinc supplementation.  There is a level of concern that perhaps if these treatments had been offered that [name] would be in a better health position.  I was able to offer support through listening.  I shared that I can bring these concerns up to the primary medical team.  [name] states "at least then I would know I did something" had these  treatments been provided.  We discussed how seeing [name] would be the most valuable next step at better assessing her present health state.  Transfer orders have been placed by the primary team for a medical intensive care bed as patient no longer requires Covid isolation.  I shared with [name] that ideally she her father and the medical team and Palliative care team would be able to meet to discuss the next steps.  Her father is not off Covid isolation until this upcoming Wednesday.  In the meanwhile [name] plans to speak with her father about our conversation.  Discussed the importance of continued conversation with family and their  medical providers regarding overall plan of care and treatment options, ensuring decisions are within the context of the patients values and GOCs.  Decision Maker: [name] (Spouse) [phone] [name] (Daughter) [phone]  SUMMARY OF RECOMMENDATIONS   Full code/full scope of care -strongly recommended consideration of DO NOT RESUSCITATE CODE STATUS given patient's prolonged ventilation and poor likelihood of meaningful recovery if underwent a major cardiac event  Brought up to primary team that patient's daughter, [name] has some questions about monoclonal antibodies and high-dose vitamin C infusions  Spiritual support-patient is [facility]  Ongoing palliative team support it would be most beneficial if we could meet in person though patient's husband will not be off isolation until Wednesday of this upcoming week.  In the meanwhile, patient should transition out of Covid ICU to medical ICU she will no longer require isolation.  Code Status/Advance Care Planning: FULL CODE   Palliative Prophylaxis:   Oral care, mobility, Pain, Delirium Precuations  Additional Recommendations (Limitations, Scope, Preferences): Continue current scope of care   Psycho-social/Spiritual:   Desire for further Chaplaincy support: Yes - [employer]  Additional  Recommendations: Education on chronic disease processes and prolonged ventilation   Prognosis: Exceptionally poor - ProVent score of 92% expected one year mortality  Discharge Planning: Unclear  PPS: 10%   This conversation/these recommendations were discussed with patient primary care team, Dr. Tacy Learn  Time In: 1320 Time Out:  1430 Total Time: 70 Greater than 50%  of this time was spent counseling and coordinating care related to the above assessment and plan.  [facility] Team Team Cell Phone: [phone] Please utilize secure chat with additional questions, if there is no response within 30 minutes please call the above phone number  Palliative Medicine Team providers are available by phone from 7am to 7pm daily and can be reached through the team cell phone.  Should this patient require assistance outside of these hours, please call the patient's attending physician.

## 2020-06-13 NOTE — Procedures (Signed)
Cortrak  Person Inserting Tube:  Maylon Peppers C, RD Tube Type:  Cortrak - 43 inches Tube Location:  Right nare Initial Placement:  Postpyloric Secured by: Bridle Technique Used to Measure Tube Placement:  Documented cm marking at nare/ corner of mouth Cortrak Secured At:  83 cm    Cortrak Tube Team Note:  Consult received to place a Cortrak feeding tube after previous tube dislodged during turning.   No x-ray is required. RN may begin using tube.   If the tube becomes dislodged please keep the tube and contact the Cortrak team at [email or web address] ([email or web address]) for replacement.  If after hours and replacement cannot be delayed, place a NG tube and confirm placement with an abdominal x-ray.    Lockie Pares., RD, LDN, CNSC See AMiON for contact information

## 2020-06-13 NOTE — Progress Notes (Signed)
Brief Progress Note: Pt daughter [name] updated by phone. All questions asked and answered.  Magdalen Spatz, MSN, AGACNP-BC [place] See Amion for personal pager PCCM on call pager [phone]

## 2020-06-13 NOTE — Progress Notes (Signed)
Palliative Medicine RN Note: PMT NP Wadie Lessen attempted to reach family yesterday without success. We continue to make attempts to reach them.  Marjie Skiff Nikhita Mentzel, RN, BSN, [facility] Palliative Medicine Team 06/13/2020 10:15 AM Office [phone]

## 2020-06-14 ENCOUNTER — Inpatient Hospital Stay (HOSPITAL_COMMUNITY): Payer: Medicare HMO

## 2020-06-14 DIAGNOSIS — J9601 Acute respiratory failure with hypoxia: Secondary | ICD-10-CM

## 2020-06-14 DIAGNOSIS — Z7189 Other specified counseling: Secondary | ICD-10-CM | POA: Diagnosis not present

## 2020-06-14 DIAGNOSIS — Z515 Encounter for palliative care: Secondary | ICD-10-CM | POA: Diagnosis not present

## 2020-06-14 LAB — CBC
HCT: 26.1 % — ABNORMAL LOW (ref 36.0–46.0)
Hemoglobin: 7.7 g/dL — ABNORMAL LOW (ref 12.0–15.0)
MCH: 29.4 pg (ref 26.0–34.0)
MCHC: 29.5 g/dL — ABNORMAL LOW (ref 30.0–36.0)
MCV: 99.6 fL (ref 80.0–100.0)
Platelets: 113 10*3/uL — ABNORMAL LOW (ref 150–400)
RBC: 2.62 MIL/uL — ABNORMAL LOW (ref 3.87–5.11)
RDW: 16.6 % — ABNORMAL HIGH (ref 11.5–15.5)
WBC: 4.8 10*3/uL (ref 4.0–10.5)
nRBC: 0 % (ref 0.0–0.2)

## 2020-06-14 LAB — TYPE AND SCREEN
ABO/RH(D): B POS
Antibody Screen: NEGATIVE
Unit division: 0

## 2020-06-14 LAB — BPAM RBC
Blood Product Expiration Date: 202111102359
ISSUE DATE / TIME: 202110221315
Unit Type and Rh: 7300

## 2020-06-14 LAB — GLUCOSE, CAPILLARY
Glucose-Capillary: 104 mg/dL — ABNORMAL HIGH (ref 70–99)
Glucose-Capillary: 116 mg/dL — ABNORMAL HIGH (ref 70–99)
Glucose-Capillary: 123 mg/dL — ABNORMAL HIGH (ref 70–99)
Glucose-Capillary: 113 mg/dL — ABNORMAL HIGH (ref 70–99)

## 2020-06-14 LAB — CULTURE, RESPIRATORY W GRAM STAIN: Culture: NORMAL

## 2020-06-14 LAB — COMPREHENSIVE METABOLIC PANEL
ALT: 593 U/L — ABNORMAL HIGH (ref 0–44)
AST: 1413 U/L — ABNORMAL HIGH (ref 15–41)
Albumin: <1 g/dL — ABNORMAL LOW (ref 3.5–5.0)
Alkaline Phosphatase: 99 U/L (ref 38–126)
Anion gap: 12 (ref 5–15)
BUN: 111 mg/dL — ABNORMAL HIGH (ref 8–23)
CO2: 30 mmol/L (ref 22–32)
Calcium: 8.2 mg/dL — ABNORMAL LOW (ref 8.9–10.3)
Chloride: 103 mmol/L (ref 98–111)
Creatinine, Ser: 2.11 mg/dL — ABNORMAL HIGH (ref 0.44–1.00)
GFR, Estimated: 25 mL/min — ABNORMAL LOW (ref >60)
Glucose, Bld: 105 mg/dL — ABNORMAL HIGH (ref 70–99)
Potassium: 4.4 mmol/L (ref 3.5–5.1)
Sodium: 145 mmol/L (ref 135–145)
Total Bilirubin: 0.7 mg/dL (ref 0.3–1.2)
Total Protein: 4.6 g/dL — ABNORMAL LOW (ref 6.5–8.1)

## 2020-06-14 LAB — MAGNESIUM: Magnesium: 2.3 mg/dL (ref 1.7–2.4)

## 2020-06-14 LAB — PROCALCITONIN: Procalcitonin: 2.29 ng/mL

## 2020-06-14 LAB — D-DIMER, QUANTITATIVE: D-Dimer, Quant: 12.47 ug/mL-FEU — ABNORMAL HIGH (ref 0.00–0.50)

## 2020-06-14 MED ORDER — BISACODYL 10 MG RE SUPP: 10.0000 mg | Freq: Every day | RECTAL | Status: DC | PRN

## 2020-06-14 MED ORDER — NOREPINEPHRINE 16 MG/250ML-% IV SOLN
0.0000 ug/min | INTRAVENOUS | Status: DC
  Administered 2020-06-14: 20 ug/min via INTRAVENOUS
  Administered 2020-06-14: 36 ug/min via INTRAVENOUS
  Filled 2020-06-14 (×2): qty 250

## 2020-06-14 MED ORDER — VASOPRESSIN 20 UNITS/100 ML INFUSION FOR SHOCK
0.0000 [IU]/min | INTRAVENOUS | Status: DC
  Administered 2020-06-14: 0.03 [IU]/min via INTRAVENOUS
  Filled 2020-06-14: qty 100

## 2020-06-14 MED ORDER — METOLAZONE 5 MG PO TABS: 5.0000 mg | ORAL_TABLET | Freq: Two times a day (BID) | ORAL | Status: DC

## 2020-06-14 MED ORDER — SODIUM CHLORIDE 0.9 % IV SOLN: 0.0000 mg/h | INTRAVENOUS | Status: DC

## 2020-06-14 MED ORDER — PHENYLEPHRINE HCL-NACL 10-0.9 MG/250ML-% IV SOLN
INTRAVENOUS | Status: AC
  Administered 2020-06-14: 10 mg
  Filled 2020-06-14: qty 250

## 2020-06-14 MED ORDER — PHENYLEPHRINE HCL-NACL 20-0.9 MG/250ML-% IV SOLN
0.0000 ug/min | INTRAVENOUS | Status: DC
  Administered 2020-06-14: 150 ug/min via INTRAVENOUS
  Filled 2020-06-14 (×2): qty 250

## 2020-06-14 MED ORDER — MIDAZOLAM 50MG/50ML (1MG/ML) PREMIX INFUSION
0.0000 mg/h | INTRAVENOUS | Status: DC
  Administered 2020-06-14: 6 mg/h via INTRAVENOUS
  Filled 2020-06-14: qty 50

## 2020-06-14 MED ORDER — SODIUM CHLORIDE 0.9 % IV SOLN: 2.0000 g | INTRAVENOUS | Status: DC

## 2020-06-14 MED ORDER — HYDROMORPHONE HCL 1 MG/ML IJ SOLN: 0.5000 mg | INTRAMUSCULAR | Status: DC | PRN

## 2020-06-14 MED ORDER — PHENYLEPHRINE HCL-NACL 10-0.9 MG/250ML-% IV SOLN
0.0000 ug/min | INTRAVENOUS | Status: DC
  Administered 2020-06-14: 20 ug/min via INTRAVENOUS
  Administered 2020-06-14: 100 ug/min via INTRAVENOUS
  Filled 2020-06-14 (×2): qty 250

## 2020-06-15 LAB — CALCIUM, IONIZED: Calcium, Ionized, Serum: 4.5 mg/dL (ref 4.5–5.6)

## 2020-06-18 LAB — CULTURE, BLOOD (ROUTINE X 2)
Culture: NO GROWTH
Culture: NO GROWTH
Special Requests: ADEQUATE

## 2020-06-23 NOTE — Progress Notes (Signed)
Received pt sedated on bed comfortable not in distress with ongoing fentanyl infusion at 300 mcg/hr, midazolam drip at 6 mg/hr,  with ETT to Mv setting Fi02=100%, with ongoing norepinephrine infusion at 36 mcg/min, phenylephrine drip at 13.33 mcg/min, vasopressin at 0.03 units /min.with ongoing OGT feeding , with foley catheter to bag, with rectal tube, as reported pt is for comfort care with orders awaiting for the son to come, other pt relative is inside the pt

## 2020-06-23 NOTE — Progress Notes (Signed)
Palliative Medicine Inpatient Follow Up Note  Reason for consult:  Goals of Care "Covid-19 patient with minimal improvement 2 weeks on vent"  HPI:  Per intake H&P --> 16 yowf ,with history of T2DM, OSA, Hothyroidism, HTN, HLD, hepatitis, and GERD presents to the ED with a chief complaint of dyspnea.  Identified to be Covid pneumonia positive.  Has been ventilated for the past 2 weeks without significant improvement.  Palliative care was asked to get involved to speak to the patient's family about goals of care.  Today's Discussion (06-17-20): I met with RN, Vivien Rota at bedside. She shares with me that [name] had an increase in pressor requirements overnight. Concern that she is reaching a point of maximum support with little benefit.   Patients husband and daughter have been granted permission to come in and  Spend 15 minutes with [name] this afternoon.  I was able to meet with Rise Paganini and Remo Lipps at bedside - this is the first time they have had the opportunity to see [name] in the last few weeks. Requested chaplaincy support given the magnitude of the situation at hand.   I was able to find a quiet space to talk with Rise Paganini and Remo Lipps. I shared that her respiratory state was tenuous though more notably her kidneys were halting in their functioning. Kaeli daughters said "once your kidneys do not work you are done". I validated this as often being a sign of the bodies inability to keep up with the demands at hand - we discussed this being a sign that her organs were shutting down and her time was short. Remo Lipps understood this. He stated that she would not wish to be in her present situation if there was no likelihood of improvement.   We talked about transition to comfort measures in house and what that would entail inclusive of medications to control pain, dyspnea, agitation, nausea, itching, and hiccups.   We discussed stopping all uneccessary measures such as blood draws, needle sticks, and  frequent vital signs. We talked about allowing family to visit and then proceeding with extubation and weaning off pressor therapy when Remo Lipps feels as prepared as he can.  Utilized reflective listening throughout our time together. This is clearly a very difficult time for Remo Lipps and his family. Utilized empathetic listening.   Patients family are mobilizing to see her.  Verified visitation policy of three people at a time in rotation.  I was able to call Rise Paganini back and share with her the information.  Discussed the importance of continued conversation with family and their  medical providers regarding overall plan of care and treatment options, ensuring decisions are within the context of the patients values and GOCs.  Questions and concerns addressed   Objective Assessment: Vital Signs Vitals:   June 17, 2020 1200 06-17-2020 1221  BP: (!) 132/49   Pulse: (!) 114   Resp: (!) 28   Temp:  97.8 F (36.6 C)  SpO2: (!) 79%     Intake/Output Summary (Last 24 hours) at 06/17/20 1255 Last data filed at 06/17/2020 1200 Gross per 24 hour  Intake 3697.35 ml  Output 1285 ml  Net 2412.35 ml   Last Weight  Most recent update: 2020/06/17  4:34 AM   Weight  81.1 kg (178 lb 12.7 oz)           Gen:  Elderly F intubated and sedated HEENT: Coretrack in place, dry mucous membranes CV: Irregular rate and rhythm, no murmurs rubs or gallops PULM: Intubated on  vent support ABD: soft  EXT: generalized edema  Neuro: Sedated  SUMMARY OF RECOMMENDATIONS DNR  Patients family plans to come and spend time with her then will transition fully to comfort focused care with liberation from ventilatory support - orders per [facility]  Spiritual support- Patient is [facility] and would benefit from ongoing spiritual support  Ongoing palliative team support  Time Spent: 70 Greater than 50% of the time was spent in counseling and coordination of  care ______________________________________________________________________________________ [facility] Team Team Cell Phone: [phone] Please utilize secure chat with additional questions, if there is no response within 30 minutes please call the above phone number  Palliative Medicine Team providers are available by phone from 7am to 7pm daily and can be reached through the team cell phone.  Should this patient require assistance outside of these hours, please call the patient's attending physician.

## 2020-06-23 NOTE — Death Summary Note (Signed)
DEATH SUMMARY   Patient Details  Name: [name] MRN: [id number] DOB: Jan 07, 1951  Admission/Discharge Information   Admit Date:  Jun 14, 2020  Date of Death: Date of Death: Jul 07, 2020  Time of Death: Time of Death: December 06, 2038  Length of Stay: Dec 06, 2022  Referring Physician: Sharilyn Sites, MD   Reason(s) for Hospitalization  Acute hypoxic/hypercapnic respiratory failure due to ARDS from COVID-19 pneumonia Viral sepsis from COVID-19 (present on admission), complicated with septic shock Hypokalemia/hypomagnesemia AKI  On  CKD 3B Acute on chronic blood loss anemia due to rectus sheath hematoma  Diagnoses  Preliminary cause of death:  Secondary Diagnoses (including complications and co-morbidities):  Principal Problem:   Respiratory failure with hypoxia ([place]) Active Problems:   HTN (hypertension)   OSA (obstructive sleep apnea)   DM2 (diabetes mellitus, type 2) (HCC)   Acute respiratory disease due to COVID-19 virus   Pneumonia due to COVID-19 virus   Hypotension   Renal insufficiency   COVID-19 virus infection   ARDS (adult respiratory distress syndrome) ()   Palliative care by specialist   Goals of care, counseling/discussion   Brief Hospital Course (including significant findings, care, treatment, and services provided and events leading to death)  [name] is a 69 y.o. year old female with history of T2DM, OSA, Hothyroidism, HTN, HLD, hepatitis, and GERD presents to the ED with a chief complaint of dyspnea.Patient reports that symptom onset was 5 weeks ago, and started with diverticulitis. Patient reports that since then she has had dyspnea and cough that started simultaneously.The dyspnea and cough became worse over the last 2 weeks. Patient reports that dyspnea is worse with exertion, and she can even walk now. She reports that she fell on 9/29, which she attributes to being so weak. She describes the fall as she was sitting in her chair got up took 2 steps and ended up on  the ground. She does not remember feeling lightheaded, or any preceding symptoms, and she did not hit her head. Patient reports that she is weak because she has had decreased appetite and decreased p.o. intake since the diverticulitis 5 weeks pta. Patient reports that her taste and smell are intact. She does not have chest pain or palpitations. She reports that her cough is a dry cough. She had fevers for the first 3 weeks of her symptoms, but not for the last 2 weeks. She reports that her T-max was 103.1, and Tylenol helps when she remembers to take it. Patient has not had body aches, nausea vomiting, diarrhea, dysuria. She does admit to shakiness, and vision changes over the last 2 weeks. She reports that she cannot focus her eyes, so is blurry vision, but not double vision. She does not have a sore throat and is not has clogged sinuses. Patient did not get a Covid vaccine, because "I did not want 1." Patient is refusing remdesivir.  Patient was intubated in the ICU with increasing shortness of breath, she was found to be hypoxic and hypercapnic requiring proning, she was continued on a steroid therapy, patient did not improve she continued to get worse developed septic shock, she did qualify for severe sepsis upon admission but then she started requiring vasopressors, she had electrolyte abnormalities including hypokalemia and hypomagnesemia.  She has history of CKD stage III at baseline then developed acute kidney injury.  Due to her condition is started getting worse, palliative care consult was requested after meeting with family patient's family chose to proceed with DNR/DNI and comfort care with  palliative extubation which was done on 11-Jul-2023 December 09, 2019.  Patient was declared dead on 2020/07/10 at 8:40 PM    Pertinent Labs and Studies  Significant Diagnostic Studies CT ABDOMEN PELVIS WO CONTRAST  Result Date: 05/28/2020 CLINICAL DATA:  GI bleeding EXAM: CT ABDOMEN AND PELVIS WITHOUT CONTRAST  TECHNIQUE: Multidetector CT imaging of the abdomen and pelvis was performed following the standard protocol without IV contrast. COMPARISON:  10/15/2018 FINDINGS: Lower chest: Normal heart size. Left-sided central line with tip at the distal SVC. Ground-glass opacity in the bilateral lungs consistent with history of COVID-19 positivity. Hepatobiliary: No focal liver abnormality.Cholecystectomy without bile duct dilatation. Pancreas: Generalized atrophy Spleen: Granulomatous calcifications. Adrenals/Urinary Tract: Negative adrenals. No hydronephrosis or stone. Bladder is decompressed by Foley catheter and rightward displaced by the below described hematoma Stomach/Bowel: History of GI bleeding. No high-density material filling the stomach or other definite segment. Oral contrast is seen within nondilated colon where there are numerous left colonic diverticula. There is an enteric tube in good position. No visible bowel inflammation. Vascular/Lymphatic: Limited without contrast. Aortic atherosclerosis. No retroperitoneal mass or adenopathy. There is extraperitoneal hematoma as described below. Reproductive:Unremarkable for age Other: Rectus sheath hematoma inferiorly on the left which measures 14 cm craniocaudal and 8 cm in thickness. Inferiorly, the hematoma dissects into the extraperitoneal space on the left more than right with mass effect on pelvic viscera. No hemoperitoneum or pneumoperitoneum. Musculoskeletal: Spondylosis and degenerative change without acute abnormalities. Critical Value/emergent results were called by telephone at the time of interpretation on 05/28/2020 at 4:41 am to provider Dr Duwayne Heck , who verbally acknowledged these results. IMPRESSION: 1. Large left rectus sheath hematoma dissecting into the extraperitoneal space. The hematoma measures 14 cm craniocaudal and 8 cm in thickness. 2. COVID pneumonia. 3. Atherosclerosis and colonic diverticulosis. Electronically Signed   By: Monte Fantasia  M.D.   On: 05/28/2020 04:41   DG CHEST PORT 1 VIEW  Result Date: 06/12/2020 CLINICAL DATA:  COVID pneumonia EXAM: PORTABLE CHEST 1 VIEW COMPARISON:  06/09/2020 FINDINGS: Endotracheal tube, enteric tube, and left central venous catheter are unchanged in position. Shallow inspiration. Heart size is normal. Diffuse patchy airspace disease throughout the lungs consistent with multifocal pneumonia. No change since prior study. Old fracture deformity of the right clavicle. Chronic right shoulder dislocation. IMPRESSION: Diffuse bilateral patchy airspace disease consistent with multifocal pneumonia. No change. Electronically Signed   By: Lucienne Capers M.D.   On: 06/12/2020 04:42   DG CHEST PORT 1 VIEW  Result Date: 06/09/2020 CLINICAL DATA:  ARDS due to COVID EXAM: PORTABLE CHEST 1 VIEW COMPARISON:  06/07/2020 FINDINGS: Support devices are stable. Bilateral diffuse interstitial prominence with lower lobe airspace opacities. No significant change since prior study. Heart is normal size. No effusions or pneumothorax. IMPRESSION: Continued diffuse interstitial prominence with lower lobe airspace opacities. No change since prior study. Electronically Signed   By: Rolm Baptise M.D.   On: 06/09/2020 19:02   DG CHEST PORT 1 VIEW  Result Date: 06/07/2020 CLINICAL DATA:  Acute respiratory distress syndrome. EXAM: PORTABLE CHEST 1 VIEW COMPARISON:  06/05/2020 chest radiograph and prior. FINDINGS: Interval retraction of endotracheal tube terminating 2.6 cm above the carina. Remaining support lines and tubes are unchanged. Bilateral pulmonary opacities are unchanged. No pneumothorax. Small left pleural effusion. Cardiomediastinal silhouette within normal limits. Aortic atherosclerotic calcifications. IMPRESSION: Bilateral pulmonary opacities, unchanged. Endotracheal tube terminates 2.6 cm above the carina. Remaining support lines and tubes are unchanged. Electronically Signed   By: Milus Mallick.D.  On:  06/07/2020 08:03   DG CHEST PORT 1 VIEW  Result Date: 06/05/2020 CLINICAL DATA:  Acute respiratory distress syndrome due to COVID-19 virus. EXAM: PORTABLE CHEST 1 VIEW COMPARISON:  June 02, 2020. FINDINGS: Stable cardiomediastinal silhouette. Endotracheal nasogastric tubes are unchanged in position. Left subclavian catheter is unchanged in position. No pneumothorax is noted. Stable bilateral lung opacities are noted concerning for multifocal pneumonia. No significant pleural effusion is noted. Bony thorax is unremarkable. IMPRESSION: Stable support apparatus. Stable bilateral lung opacities are noted concerning for multifocal pneumonia. Electronically Signed   By: Marijo Conception M.D.   On: 06/05/2020 10:20   DG CHEST PORT 1 VIEW  Result Date: 06/02/2020 CLINICAL DATA:  COVID positive. EXAM: PORTABLE CHEST 1 VIEW COMPARISON:  06/01/2020 FINDINGS: 0422 hours. Endotracheal tube tip is 3.4 cm above the base of the carina. The NG tube passes into the stomach although the distal tip position is not included on the film. Left IJ central line tip overlies the proximal SVC level. Diffuse interstitial and patchy basilar predominant airspace disease is similar to prior. The cardiopericardial silhouette is within normal limits for size. Bones are diffusely demineralized. Telemetry leads overlie the chest. IMPRESSION: 1. No substantial interval change in exam. Diffuse interstitial and patchy basilar predominant airspace disease. 2. Support apparatus as above. Electronically Signed   By: Misty Stanley M.D.   On: 06/02/2020 06:27   DG Chest Port 1 View  Result Date: 06/01/2020 CLINICAL DATA:  Respiratory failure EXAM: PORTABLE CHEST 1 VIEW COMPARISON:  06/01/2020 at 5:37 a.m. FINDINGS: Endotracheal tube is seen 4.0 cm above the carina. Nasogastric tube extends into the upper abdomen beyond the margin of the examination. Left internal jugular central venous catheter seen with its tip within the superior vena  cava. The lungs are symmetrically expanded. Superimposed diffuse airspace infiltrate, slight more prevalent within the lung bases bilaterally, appears stable. No pneumothorax or pleural effusion. Cardiac size within normal limits. No acute bone abnormality. IMPRESSION: Support lines and tubes are unchanged. Stable diffuse pulmonary infiltrate, edema versus infection. Electronically Signed   By: Fidela Salisbury MD   On: 06/01/2020 22:21   DG Chest Port 1 View  Result Date: 06/01/2020 CLINICAL DATA:  Reason for exam: Endotracheal tube present in COVID + pt. Pt is PRONE EXAM: PORTABLE CHEST - 1 VIEW COMPARISON:  05/31/2020 FINDINGS: Endotracheal tube, gastric tube, left IJ central venous catheter stable in position. Some increase in coarse interstitial opacities throughout both lung fields with patchy airspace opacities in the lung bases and left mid lung slightly increased. Heart size and mediastinal contours are within normal limits. Aortic Atherosclerosis (ICD10-170.0). No effusion.  No pneumothorax. Visualized bones unremarkable. IMPRESSION: Worsening asymmetric infiltrates or edema as above. Electronically Signed   By: Lucrezia Europe M.D.   On: 06/01/2020 11:20   DG CHEST PORT 1 VIEW  Result Date: 05/31/2020 CLINICAL DATA:  COVID-19 pneumonia.  Endotracheal tube present. EXAM: PORTABLE CHEST 1 VIEW COMPARISON:  05/30/2020; 05/29/2020; 05/27/2020 FINDINGS: Grossly unchanged cardiac silhouette and mediastinal contours. Slight retraction of left subclavian vein approach central venous catheter with tip overlying the superior SVC and midportion herniated into the base of the left internal jugular vein. Otherwise, stable position of support apparatus. No pneumothorax. Minimally improved aeration of lung bases with persistent bilateral interstitial opacities. No new focal airspace opacities. No acute osseous abnormalities. Old right mid clavicular fracture. Post cholecystectomy. IMPRESSION: 1. Slight retraction of  left subclavian vein approach central venous catheter with tip overlying the superior  SVC and midportion herniated into the base of the left internal jugular vein. Otherwise, stable position of support apparatus. No pneumothorax. 2. Minimally improved aeration of the lungs with otherwise similar findings suggestive of COVID 19 infection. Electronically Signed   By: Sandi Mariscal M.D.   On: 05/31/2020 12:37   DG CHEST PORT 1 VIEW  Result Date: 05/30/2020 CLINICAL DATA:  69 year old female with ARDS. Positive COVID-19. EXAM: PORTABLE CHEST 1 VIEW COMPARISON:  Chest radiograph dated 05/29/2020. FINDINGS: Endotracheal tube above the carina and left IJ central venous line with tip in similar position. Enteric tube extends below the diaphragm with tip beyond the inferior margin of the image. Faint bilateral confluent pulmonary densities again noted. No large pleural effusion or pneumothorax. Stable cardiac silhouette. Atherosclerotic calcification of the aorta. No acute osseous pathology. IMPRESSION: No significant interval change. Electronically Signed   By: Anner Crete M.D.   On: 05/30/2020 18:37   DG CHEST PORT 1 VIEW  Result Date: 05/29/2020 CLINICAL DATA:  Hypoxia EXAM: PORTABLE CHEST 1 VIEW COMPARISON:  Two days ago FINDINGS: Endotracheal tube with tip at the clavicular heads. The enteric tube tip reaches the stomach. Left IJ line with tip at the SVC. Interstitial and airspace opacity has developed from before. By abdominal CT yesterday this is ground-glass opacity in the setting of COVID positivity. Cardiomegaly accentuated by low lung volumes. No visible effusion or pneumothorax. IMPRESSION: 1. Unremarkable hardware positioning. 2. Bilateral pneumonia. Electronically Signed   By: Monte Fantasia M.D.   On: 05/29/2020 05:14   DG CHEST PORT 1 VIEW  Result Date: 05/27/2020 CLINICAL DATA:  COVID pneumonia on ventilator. EXAM: PORTABLE CHEST 1 VIEW COMPARISON:  05/27/2020 FINDINGS: The endotracheal tube,  NG tube and left IJ central venous catheters are stable. Patchy subtle bilateral infiltrates slightly improved. No pleural effusions or pneumothorax. IMPRESSION: 1. Stable support apparatus. 2. Slight interval improvement in bilateral infiltrates. Electronically Signed   By: Marijo Sanes M.D.   On: 05/27/2020 20:16   Portable Chest x-ray  Result Date: 05/27/2020 CLINICAL DATA:  Shortness of breath, intubation. EXAM: PORTABLE CHEST 1 VIEW COMPARISON:  Chest radiograph dated 05/18/2020. FINDINGS: There is been interval placement of an endotracheal tube in the midthoracic trachea. An enteric tube terminates in the stomach. The heart size is normal. Vascular calcifications are seen in the aortic arch. Mild patchy bilateral airspace opacities appear similar to prior exam. There is no pleural effusion or pneumothorax. The visualized skeletal structures are unremarkable. IMPRESSION: 1. Endotracheal tube in appropriate position. 2. Unchanged mild patchy bilateral airspace opacities. Electronically Signed   By: Zerita Boers M.D.   On: 05/27/2020 14:19   DG Chest Port 1 View  Result Date: 05/18/2020 CLINICAL DATA:  Shortness of breath and cough. EXAM: PORTABLE CHEST 1 VIEW COMPARISON:  February 08, 2016 FINDINGS: Mild multifocal infiltrates are seen within the bilateral lung bases and periphery of the mid lung fields. There is no evidence of a pleural effusion or pneumothorax. The heart size and mediastinal contours are within normal limits. Radiopaque surgical clips are seen overlying the right upper quadrant. The visualized skeletal structures are unremarkable. IMPRESSION: Mild bilateral multifocal infiltrates. Electronically Signed   By: Virgina Norfolk M.D.   On: 04/29/2020 21:07   DG Chest Port 1V same Day  Result Date: 05/27/2020 CLINICAL DATA:  Status post PICC placement. EXAM: PORTABLE CHEST 1 VIEW COMPARISON:  Earlier today FINDINGS: Left central line which may be an internal jugular line with tip in the  region of  the lower SVC. No visualized pneumothorax. Skin fold projects over the left chest. Endotracheal tube tip is approximately 3.1 cm from the carina, obscured by overlying EKG lead. Enteric tube in place with tip and side-port below the diaphragm in the stomach. Patchy heterogeneous bilateral airspace opacities in a mid-lower lung zone predominant distribution, unchanged. No evidence of pneumomediastinum. Remote right clavicle fracture. IMPRESSION: 1. Left central line with tip in the region of the lower SVC. No pneumothorax. 2. Endotracheal tube and enteric tube remain in place. 3. Unchanged bilateral heterogeneous airspace opacities consistent with COVID pneumonia. Electronically Signed   By: Keith Rake M.D.   On: 05/27/2020 16:34   VAS [place] LOWER EXTREMITY VENOUS (DVT)  Result Date: 06/12/2020  Lower Venous DVTStudy Indications: Increasing dead space ventilation, and Edema.  Anticoagulation: Heparin. Limitations: Poor ultrasound/tissue interface. Comparison Study: No prior studies. Performing Technologist: Darlin Coco  Examination Guidelines: A complete evaluation includes B-mode imaging, spectral Doppler, color Doppler, and power Doppler as needed of all accessible portions of each vessel. Bilateral testing is considered an integral part of a complete examination. Limited examinations for reoccurring indications may be performed as noted. The reflux portion of the exam is performed with the patient in reverse Trendelenburg.  +---------+---------------+---------+-----------+----------+---------------+ RIGHT    CompressibilityPhasicitySpontaneityPropertiesThrombus Aging  +---------+---------------+---------+-----------+----------+---------------+ CFV      Full           Yes      Yes                                  +---------+---------------+---------+-----------+----------+---------------+ SFJ      Full                                                          +---------+---------------+---------+-----------+----------+---------------+ FV Prox  Full                                                         +---------+---------------+---------+-----------+----------+---------------+ FV Mid   Full                                                         +---------+---------------+---------+-----------+----------+---------------+ FV DistalFull                                                         +---------+---------------+---------+-----------+----------+---------------+ PFV      Full                                                         +---------+---------------+---------+-----------+----------+---------------+ POP      Full  Yes      Yes                                  +---------+---------------+---------+-----------+----------+---------------+ PTV      Full                                                         +---------+---------------+---------+-----------+----------+---------------+ PERO                                                  Patent by color +---------+---------------+---------+-----------+----------+---------------+   +---------+---------------+---------+-----------+----------+--------------+ LEFT     CompressibilityPhasicitySpontaneityPropertiesThrombus Aging +---------+---------------+---------+-----------+----------+--------------+ CFV      Full           Yes      Yes                                 +---------+---------------+---------+-----------+----------+--------------+ SFJ      Full                                                        +---------+---------------+---------+-----------+----------+--------------+ FV Prox  Full                                                        +---------+---------------+---------+-----------+----------+--------------+ FV Mid   Full                                                         +---------+---------------+---------+-----------+----------+--------------+ FV DistalFull                                                        +---------+---------------+---------+-----------+----------+--------------+ PFV      Full                                                        +---------+---------------+---------+-----------+----------+--------------+ POP      Full           Yes      Yes                                 +---------+---------------+---------+-----------+----------+--------------+ PTV      Full                                                        +---------+---------------+---------+-----------+----------+--------------+  PERO     Full                                                        +---------+---------------+---------+-----------+----------+--------------+     Summary: RIGHT: - There is no evidence of deep vein thrombosis in the lower extremity. However, portions of this examination were limited- see technologist comments above.  - No cystic structure found in the popliteal fossa.  LEFT: - There is no evidence of deep vein thrombosis in the lower extremity.  - No cystic structure found in the popliteal fossa.  *See table(s) above for measurements and observations. Electronically signed by Servando Snare MD on 06/12/2020 at 4:07:54 PM.    Final     Microbiology Recent Results (from the past 240 hour(s))  Culture, respiratory (non-expectorated)     Status: None   Collection Time: 06/12/20  9:16 AM   Specimen: Tracheal Aspirate; Respiratory  Result Value Ref Range Status   Specimen Description TRACHEAL ASPIRATE  Final   Special Requests NONE  Final   Gram Stain   Final    RARE WBC PRESENT,BOTH PMN AND MONONUCLEAR ABUNDANT GRAM POSITIVE RODS RARE GRAM POSITIVE COCCI RARE YEAST    Culture   Final    Normal respiratory flora-no Staph aureus or Pseudomonas seen Performed at [facility], [place] [address], [place], [place] [address]     Report Status 27-Jun-2020 FINAL  Final  Culture, blood (routine x 2)     Status: None (Preliminary result)   Collection Time: 06/13/20 11:05 AM   Specimen: BLOOD LEFT HAND  Result Value Ref Range Status   Specimen Description BLOOD LEFT HAND  Final   Special Requests   Final    BOTTLES DRAWN AEROBIC AND ANAEROBIC Blood Culture adequate volume   Culture   Final    NO GROWTH < 24 HOURS Performed at [facility], [place] [address], [place], [place] [address]    Report Status PENDING  Incomplete  Culture, blood (routine x 2)     Status: None (Preliminary result)   Collection Time: 06/13/20 11:33 AM   Specimen: BLOOD LEFT ARM  Result Value Ref Range Status   Specimen Description BLOOD LEFT ARM  Final   Special Requests   Final    BOTTLES DRAWN AEROBIC ONLY Blood Culture results may not be optimal due to an inadequate volume of blood received in culture bottles   Culture   Final    NO GROWTH < 24 HOURS Performed at [facility], [place] [address] [place], [place] [address]    Report Status PENDING  Incomplete    Lab Basic Metabolic Panel: Recent Labs  Lab 06/10/20 0333 06/10/20 0430 06/11/20 0334 06/11/20 0813 06/12/20 0537 06/12/20 1403 06/13/20 0536 06/13/20 1051 06/13/20 1429 06/13/20 1447 2020-06-27 0404  NA 138   < > 141   < > 143   < > 146* 144 145 144 145  K 4.8   < > 3.8   < > 3.2*   < > 3.9 4.2 4.0 4.1 4.4  CL 99  --  100  --  100  --  102  --   --   --  103  CO2 29  --  31  --  32  --  32  --   --   --  30  GLUCOSE 192*  --  65*  --  245*  --  63*  --   --   --  105*  BUN 99*  --  97*  --  95*  --  98*  --   --   --  111*  CREATININE 1.58*  --  1.55*  --  1.59*  --  1.57*  --   --   --  2.11*  CALCIUM 8.4*  --  8.7*  --  8.3*  --  8.4*  --   --   --  8.2*  MG  --   --   --   --  1.6*  --  2.4  --   --   --  2.3  PHOS  --   --   --   --  4.3  --  3.7  --   --   --   --    < > = values in this interval not displayed.   Liver Function Tests: Recent Labs   Lab 06/13/20 0954 07-07-2020 0404  AST 495* 1,413*  ALT 245* 593*  ALKPHOS 96 99  BILITOT 0.5 0.7  PROT 4.9* 4.6*  ALBUMIN <1.0* <1.0*   No results for input(s): LIPASE, AMYLASE in the last 168 hours. No results for input(s): AMMONIA in the last 168 hours. CBC: Recent Labs  Lab 06/09/20 0457 06/09/20 1725 06/10/20 0333 06/10/20 0430 06/11/20 0334 06/11/20 0813 06/12/20 0537 06/12/20 1403 06/13/20 0536 06/13/20 1051 06/13/20 1240 06/13/20 1429 06/13/20 1447 06/13/20 2231 Jul 07, 2020 0404  WBC 5.3  --  6.1   < > 6.6  --  5.5  --  5.5  --   --   --   --  4.1 4.8  NEUTROABS 4.5  --  5.4  --  5.6  --   --   --   --   --   --   --   --   --   --   HGB 7.8*   < > 7.4*   < > 7.2*   < > 7.2*   < > 7.0*   < > 6.5* 7.1* 6.8* 7.7* 7.7*  HCT 25.8*   < > 24.8*   < > 23.8*   < > 23.5*   < > 22.7*   < > 21.2* 21.0* 20.0* 25.1* 26.1*  MCV 98.5  --  99.2   < > 97.1  --  96.7  --  96.6  --   --   --   --  97.7 99.6  PLT 114*  --  104*   < > 122*  --  135*  --  127*  --   --   --   --  106* 113*   < > = values in this interval not displayed.   Cardiac Enzymes: No results for input(s): CKTOTAL, CKMB, CKMBINDEX, TROPONINI in the last 168 hours. Sepsis Labs: Recent Labs  Lab 06/09/20 0918 06/10/20 0333 06/12/20 0537 06/13/20 0536 06/13/20 0954 06/13/20 2231 07-07-2020 0404  PROCALCITON 1.48  --   --   --  1.68  --  2.29  WBC  --    < > 5.5 5.5  --  4.1 4.8   < > = values in this interval not displayed.    Procedures/Operations     [employer] 06/15/2020, 10:17 AM

## 2020-06-23 NOTE — Progress Notes (Signed)
Just spoke with pt daughter Ms Rise Paganini, and said that, there is no autopsy and the pt funeral service is [facility] and she already spoke with the patient placement personnel, informed Ms Rise Paganini that post mortem care was  Post mortem care was already done and pt sent to [facility] by Shona Needles , no further instruction was given by Ms Rise Paganini and was thankful for the care given to patient

## 2020-06-23 NOTE — Progress Notes (Signed)
MAP below 60 on max amount of Phenylephrine. Notified MD. Will continue to monitor.

## 2020-06-23 NOTE — Progress Notes (Signed)
100 mg of versed drip wasted in the stericycle with Louretta Parma, RN.

## 2020-06-23 NOTE — Progress Notes (Signed)
Pronounced expired verified by RN Leafy Ro

## 2020-06-23 NOTE — Progress Notes (Signed)
[place] Progress Note Patient Name: CASILDA PICKERILL DOB: [birth year] MRN: [id number]   Date of Service  07-02-2020  HPI/Events of Note  BP 100/41, MAP 59  eICU Interventions  Phenylephrine infusion ordered titrated to maintain MAP > 60 mmHg.        Kerry Kass Anabelen Kaminsky 2020-07-02, 1:48 AM

## 2020-06-23 NOTE — Progress Notes (Signed)
Called up Ms [name] to get inside the pt room since pt was already extubated

## 2020-06-23 NOTE — Progress Notes (Signed)
Spoke with [place] [facility], and Ms [name] said pt was not candidate for organ donation and can go to morgue or funeral, referral number-06/30/[id number]

## 2020-06-23 NOTE — Progress Notes (Signed)
Dr. Tacy Learn was made aware of increasing pressor requirements and low urine output. Orders for levophed given.   Patient's daughter, Rise Paganini, was updated this morning of current patient status and her increasing need for pressors.

## 2020-06-23 NOTE — Progress Notes (Signed)
Pt unresponsive husband of 89 years and daughter where at bedside. The husband told me a little about their history. They are waiting for a son, hoping he can get here before the pt passes. The chaplain offered support, caring presence, prayers and blessings.

## 2020-06-23 NOTE — Progress Notes (Signed)
Ms [name] advised to do the comfort care now, informed the RT, came and extubated the pt, all IV med stopped together with feeding

## 2020-06-23 NOTE — Procedures (Signed)
Extubation Procedure Note Terminal extubation  Patient Details:   Name: [name] DOB: 04-09-1951 MRN: [id number]   Airway Documentation:    Vent end date: 28-Jun-2020 Vent end time: 2040   Evaluation   Marissa Nestle 28-Jun-2020, 8:53 PM

## 2020-06-23 NOTE — Progress Notes (Signed)
NAME:  [name], MRN:  [id number], DOB:  1951-01-10, LOS: 23 ADMISSION DATE:  05/20/2020, CONSULTATION DATE:  10/5 REFERRING MD: Memon/ Triad, CHIEF COMPLAINT:  resp distress    Brief History     24 yowf , with history of T2DM, OSA, Hothyroidism, HTN, HLD, hepatitis, and GERD presents to the ED with a chief complaint of dyspnea.  Patient reports that symptom onset was 5 weeks ago, and started with diverticulitis.  Patient reports that since then she has had dyspnea and cough that started simultaneously.  The dyspnea and cough became worse over the last 2 weeks.  Patient reports that dyspnea is worse with exertion, and she can even walk now.  She reports that she fell on 9/29, which she attributes to being so weak.  She describes the fall as she was sitting in her chair got up took 2 steps and ended up on the ground.  She does not remember feeling lightheaded, or any preceding symptoms, and she did not hit her head.  Patient reports that she is weak because she has had decreased appetite and decreased p.o. intake since the diverticulitis 5 weeks pta.  Patient reports that her taste and smell are intact.  She does not have chest pain or palpitations.  She reports that her cough is a dry cough.  She had fevers for the first 3 weeks of her symptoms, but not for the last 2 weeks.  She reports that her T-max was 103.1, and Tylenol helps when she remembers to take it.  Patient has not had body aches, nausea vomiting, diarrhea, dysuria.  She does admit to shakiness, and vision changes over the last 2 weeks.  She reports that she cannot focus her eyes, so is blurry vision, but not double vision.  She does not have a sore throat and is not has clogged sinuses.  Patient did not get a Covid vaccine, because "I did not want 1."  Patient is refusing remdesivir.  Past Medical History  DM  II OSA Hypothyroid HBP HLD GERD   Significant Hospital Events   04/28/2020 - admit 05/30/20 - Sedated and prone . No  acute issues overnight received on dose of NMB last night and again this AM  10/19 -  05/31/2020: On fentanyl infusion, Versed infusion.  Off Levophed infusion and then back on.  On tube feeds.  On the ventilator FiO2 60%. Peep 12 -> pulse ox 95%. Afebrile.  Supine since 4am. PF ratio 201 10/17 continues on prone and supine ventilation. 10/19 Attempting to wean PEEP, family discussion regarding trach 10/22 Back to PRVC>> dyssynchronous in APRV mode   Consults:  PCCM  10/5  Procedures:  Oral ET  10/5  L IJ CVL  10/5   Significant Diagnostic Tests:  CT Abd L large rectus sheath hematoma, COVID PNA on visible lung fields  Micro Data:  Covid 19  PCR  9/30  POS MRSA PCR  10/1 >   Neg   Blood 05/27/2020: Negative Tracheal aspirate 05/28/2020: Rare Candida blood culture Blood 06/01/2020 >> neg Tracheal aspirate 06/01/2020 >> few candida albicans Tracheal Aspirate 10/21>> GS Abundant G + rods, rare gram + cocci,rare yeast   ID Rx:  Baricitinib 10/3 > 10/5 (stopped following abd pain and rise in LFT) Remdesivir [refused] Solu-Medrol 10/1  > (stop in 14 days total) Cefepime 10/10 >> 10/11 Cefepime 10/22  Interim history/subjective:  Spoke with patient's daughter and explained patient's condition that she is slowly deteriorating, after supination  patient's O2 sat was in 93s and she was hypotensive requiring vasopressors  Objective   Blood pressure (!) 127/55, pulse (!) 113, temperature 98 F (36.7 C), temperature source Oral, resp. rate (!) 31, height 5\' 1"  (1.549 m), weight 81.1 kg, SpO2 (!) 76 %.    Vent Mode: PRVC FiO2 (%):  [100 %] 100 % Set Rate:  [33 bmp] 33 bmp Vt Set:  [330 mL] 330 mL PEEP:  [14 cmH20] 14 cmH20 Plateau Pressure:  [21 cmH20-26 cmH20] 25 cmH20   Intake/Output Summary (Last 24 hours) at 07/09/2020 1848 Last data filed at 2020/07/09 1800 Gross per 24 hour  Intake 2796.68 ml  Output 1070 ml  Net 1726.68 ml   Filed Weights   06/12/20 0422 06/13/20 0442  09-Jul-2020 0434  Weight: 85.6 kg 82.5 kg 81.1 kg   General:  Critically ill, supinated orally intubated HEENT: MM pink/moist, ETT in place, Cor Trac is pulled out, TF are off, + facial edema Neuro: sedated on fentanyl and versed, unresponsive, PERRLA, minimal gag CV: s1s2 rrr, no m/r/g PULM: Mechanical breath sounds GI: soft, bsx4 active , TF on hold Extremities: warm/dry, 2+ edema upper and lower extremities Skin: no rashes or lesions   Resolved Hospital Problem list    Hyperkalemia- resolved  Assessment & Plan:   Acute hypoxic/hypercapnic respiratory failure and sepsis (POA) due to severe ARDS from COVID19 pneumonia Undifferentiated shock, likely septic Hypokalemia AKI on CKD stage IIIb Acute on chronic anemia of critical illness  Patient's condition got worse, she became hypoxic and developed septic shock requiring multiple vasopressors, family meeting was called in, after discussion with palliative care, family decided to keep patient DNR/DNI and proceed with palliative extubation with comfort measures. Comfort measures orders were placed, family is at bedside currently.  Daily Goals Checklist  Pain/Anxiety/Delirium protocol (if indicated): Fentanyl/ versed gtt, enteral OxyIR/ klonopin VAP protocol (if indicated): Bundle in place Respiratory support goals: Continue on PRVC at current settings.  Continues to require prone ventilation. Blood pressure target: Not requiring vasopressors. Maintain MAP greater than 65. DVT prophylaxis: Unfractionated heparin for DVT prophylaxis Nutrition Status: EN GI prophylaxis: Pantoprazole Fluid status goals:  Diurese today again Urinary catheter:  Central lines: Left subclavian central line Glucose control: Type 2 diabetes with stress hyperglycemia adequate control at present. Mobility/therapy needs: Bedrest Antibiotic de-escalation: monitor clinically  Home medication reconciliation: Continue home Synthroid Daily labs: CBC/ BMET Code  Status: Full code Family Communication: PT's daughter Rise Paganini updated 10/21 Disposition: ICU  LABS    PULMONARY Recent Labs  Lab 06/11/20 0813 06/11/20 0813 06/12/20 0457 06/12/20 1403 06/13/20 1051 06/13/20 1429 06/13/20 1447  PHART 7.403  --  7.380 7.393 7.420  --  7.390  PCO2ART 56.9*  --  61.7* 56.6* 60.1*  --  58.0*  PO2ART 69*  --  70* 57* 136*  --  47*  HCO3 35.5*   < > 36.1* 34.1* 39.0* 35.2* 35.1*  TCO2 37*   < > 38* 36* 41* 37* 37*  O2SAT 93.0   < > 92.0 86.0 99.0 82.0 81.0   < > = values in this interval not displayed.    CBC Recent Labs  Lab 06/13/20 0536 06/13/20 1051 06/13/20 1447 06/13/20 2231 July 09, 2020 0404  HGB 7.0*   < > 6.8* 7.7* 7.7*  HCT 22.7*   < > 20.0* 25.1* 26.1*  WBC 5.5  --   --  4.1 4.8  PLT 127*  --   --  106* 113*   < > =  values in this interval not displayed.    COAGULATION No results for input(s): INR in the last 168 hours.  CARDIAC  No results for input(s): TROPONINI in the last 168 hours. No results for input(s): PROBNP in the last 168 hours.   CHEMISTRY Recent Labs  Lab 06/10/20 0333 06/10/20 0430 06/11/20 0334 06/11/20 0813 06/12/20 0537 06/12/20 1403 06/13/20 0536 06/13/20 0536 06/13/20 1051 06/13/20 1051 06/13/20 1429 06/13/20 1429 06/13/20 1447 2020/06/22 0404  NA 138   < > 141   < > 143   < > 146*  --  144  --  145  --  144 145  K 4.8   < > 3.8   < > 3.2*   < > 3.9   < > 4.2   < > 4.0   < > 4.1 4.4  CL 99  --  100  --  100  --  102  --   --   --   --   --   --  103  CO2 29  --  31  --  32  --  32  --   --   --   --   --   --  30  GLUCOSE 192*  --  65*  --  245*  --  63*  --   --   --   --   --   --  105*  BUN 99*  --  97*  --  95*  --  98*  --   --   --   --   --   --  111*  CREATININE 1.58*  --  1.55*  --  1.59*  --  1.57*  --   --   --   --   --   --  2.11*  CALCIUM 8.4*  --  8.7*  --  8.3*  --  8.4*  --   --   --   --   --   --  8.2*  MG  --   --   --   --  1.6*  --  2.4  --   --   --   --   --   --  2.3   PHOS  --   --   --   --  4.3  --  3.7  --   --   --   --   --   --   --    < > = values in this interval not displayed.   Estimated Creatinine Clearance: 24.6 mL/min (A) (by C-G formula based on SCr of 2.11 mg/dL (H)).   LIVER Recent Labs  Lab 06/13/20 0954 06-22-2020 0404  AST 495* 1,413*  ALT 245* 593*  ALKPHOS 96 99  BILITOT 0.5 0.7  PROT 4.9* 4.6*  ALBUMIN <1.0* <1.0*     INFECTIOUS Recent Labs  Lab 06/09/20 0918 06/13/20 0954 06-22-20 0404  PROCALCITON 1.48 1.68 2.29     ENDOCRINE CBG (last 3)  Recent Labs    2020/06/22 0809 Jun 22, 2020 1218 06-22-20 1615  [place]* 113*   Jacky Kindle MD Critical care physician [facility]  Pager: [phone] Mobile: [phone]

## 2020-06-23 NOTE — Progress Notes (Signed)
Apt husband, son and daughter came informed pt was already expired at 2040H, informed CN Gerald Stabs, instruction was given to Ms Rise Paganini what will be the next step, Ms Rise Paganini claimed that no pt belongings was inside the pt room since it was hand over with them before transfer here in ICU, that includes jewelry and clothing's, Ms Rise Paganini advised will call back after she confirm with the father about the need of autopsy and the funeral, CN Gerald Stabs aware

## 2020-06-23 NOTE — Progress Notes (Signed)
PHARMACY NOTE:  ANTIMICROBIAL RENAL DOSAGE ADJUSTMENT  Current antimicrobial regimen includes a mismatch between antimicrobial dosage and estimated renal function.  As per policy approved by the Pharmacy & Therapeutics and Medical Executive Committees, the antimicrobial dosage will be adjusted accordingly.  Current antimicrobial dosage:  Cefepime 2g IV every 12 hours  Indication: Persistent Fever  Renal Function:  Estimated Creatinine Clearance: 24.6 mL/min (A) (by C-G formula based on SCr of 2.11 mg/dL (H)). []      On intermittent HD, scheduled: []      On CRRT    Antimicrobial dosage has been changed to:  Cefepime 2g IV every 24 hours  Additional comments:   Thank you for allowing pharmacy to be a part of this patient's care.  Brain Hilts, [facility] 07/04/20 8:07 AM

## 2020-06-23 NOTE — Progress Notes (Signed)
Spoke with Ms [name], from E - link informed that pt expired at 2040H, no further instruction was made

## 2020-06-23 NOTE — Progress Notes (Signed)
Wasted midazolam 20 mg=68ml, fentanyl 9ml=100 mcg, in stericycle as witness by [employer]

## 2020-06-23 NOTE — Progress Notes (Signed)
Pt supined at this time with no plans to re-prone per Dr Tacy Learn. Pt has multiple abrasions on tongue and bottom lip. Et secured in proper position to the left where the least amount of sores are. Pt's family coming in to see her this afternoon.

## 2020-06-23 NOTE — Progress Notes (Signed)
Spoke with patient placement Ms. Jerrell Belfast , patient placement and said all was completed and will notify funeral, MD Currie Paris will sign death cert as per Ms Solmon Ice

## 2020-06-23 NOTE — Progress Notes (Signed)
Just spoke with Ms Rise Paganini, pt daughter and said still cannot give the name of the funeral service and the decision for autopsy since the father is on his way home, Ms Rise Paganini said I can do the post mortem care now, informed Cn Gerald Stabs about the conversation, then also informed that [place] [facility] Ms Gentry Fitz already said can do post mortem and send the body to morgue or to funeral, also informed that Ms Solmon Ice from E link aware about the pt, Cn Gerald Stabs advised to start the care

## 2020-06-23 NOTE — Progress Notes (Signed)
Spoke with the pt relative discussed about the policy on comfort care, per discussion with the daughter MS Rise Paganini, nobody can stay inside the pt room when they will decide to do the comfort care tomorrow morning, they can come back tomorrow morning , if they will do it now they can just go out the room during extubation and can get inside after extubation- Ms Rise Paganini agreed

## 2020-06-23 DEATH — deceased

## 2020-12-07 IMAGING — CT CT ABD-PELV W/O
2 of 4 series · 16 of 46 positions shown, 18 images · non-contrast
Comparison: None.

CLINICAL DATA: Nausea vomiting for 4 days. Immunosuppressed
patient.

EXAM:
CT ABDOMEN AND PELVIS WITHOUT CONTRAST
TECHNIQUE: Multidetector CT imaging of the abdomen and pelvis was performed
following the standard protocol without IV contrast.

[Series 2: axial st · axial · 0.78mm/px · z∈[+1088,+1508]mm · 13 of 92 slices shown, 15 images]
[im 4/92  soft-tissue]
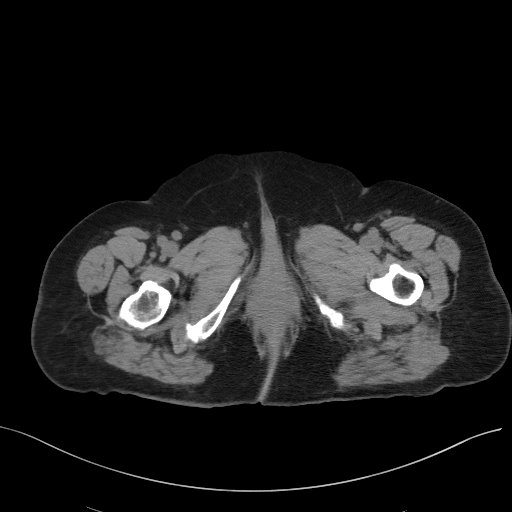
[im 4/92  bone]
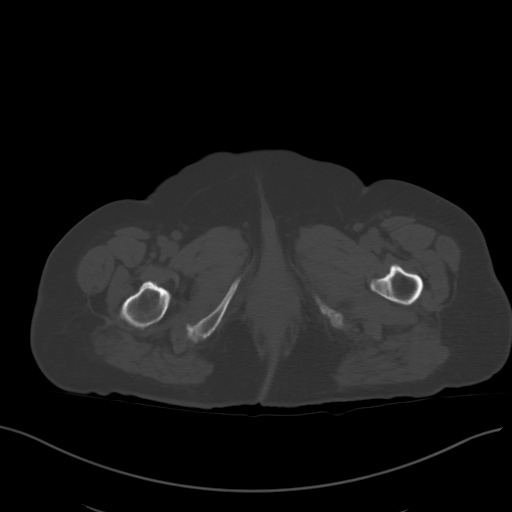
[im 11/92  soft-tissue]
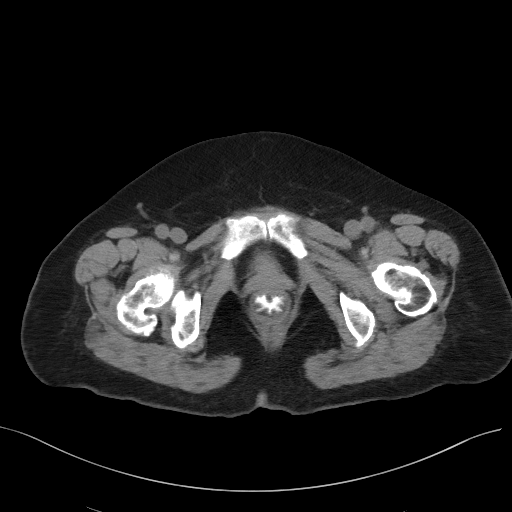
[im 19/92  soft-tissue]
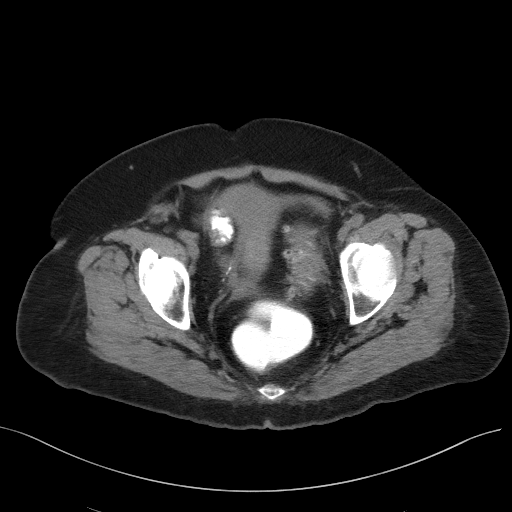
[im 26/92  soft-tissue]
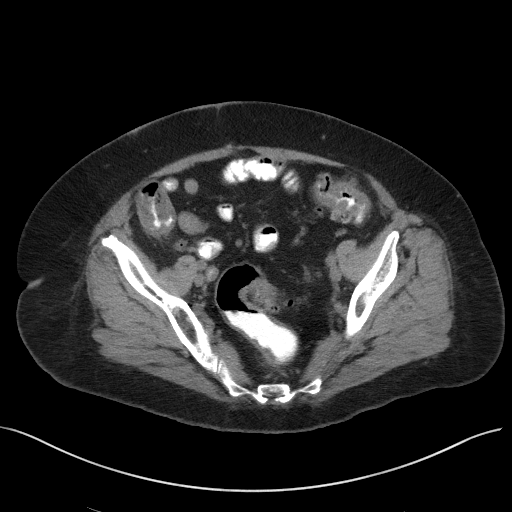
[im 33/92  soft-tissue]
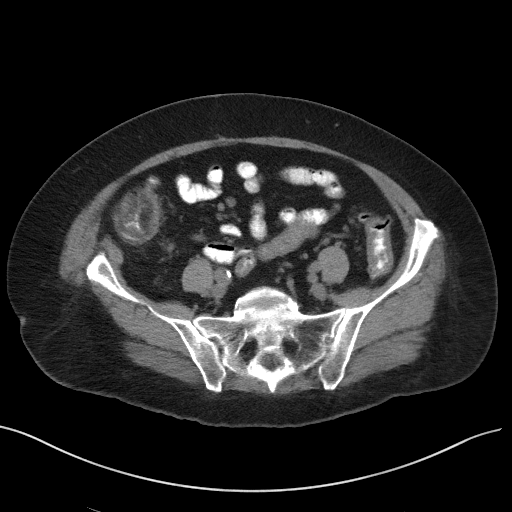
[im 41/92  soft-tissue]
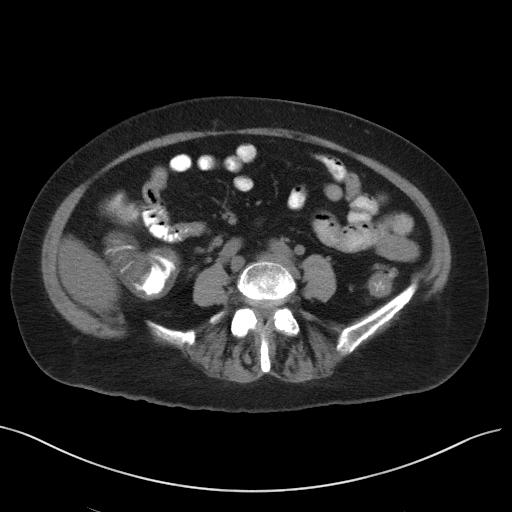
[im 48/92  soft-tissue]
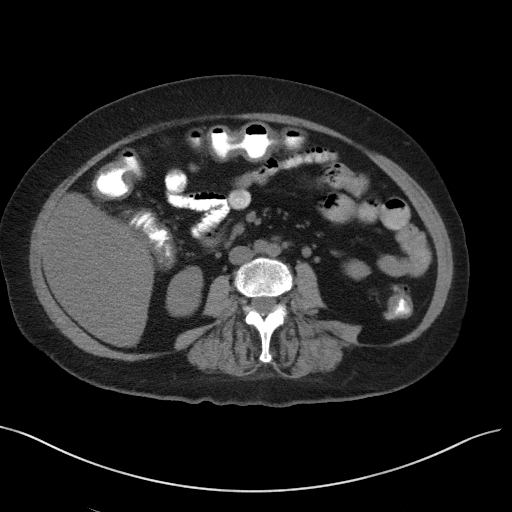
[im 51/92  soft-tissue]
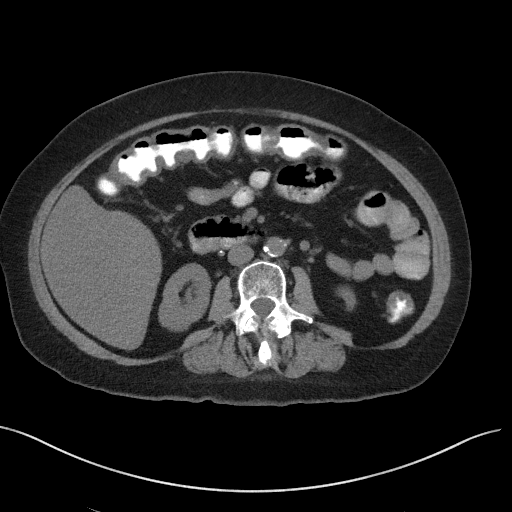
[im 59/92  soft-tissue]
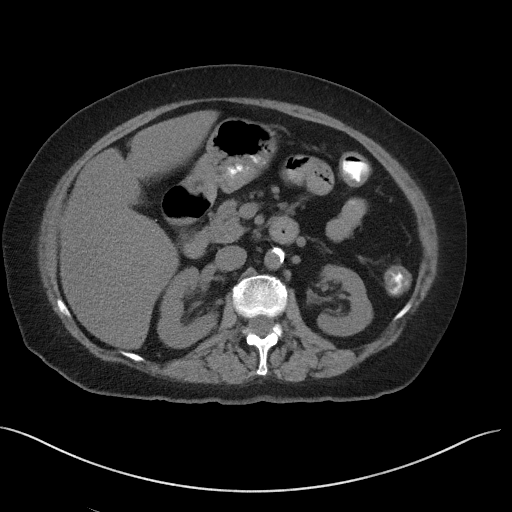
[im 59/92  bone]
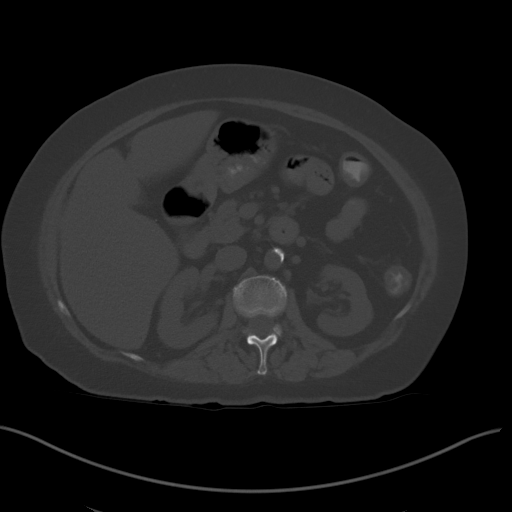
[im 66/92  soft-tissue]
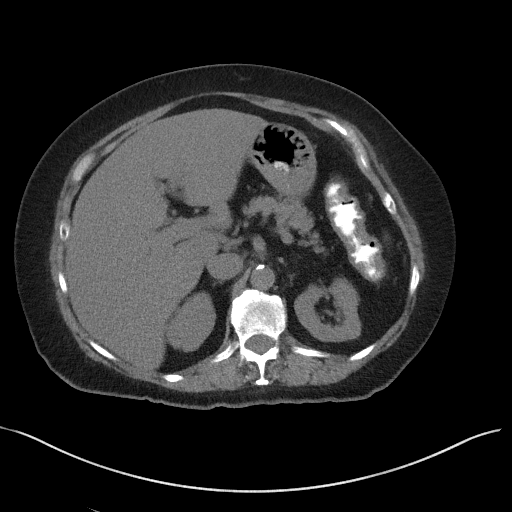
[im 73/92  soft-tissue]
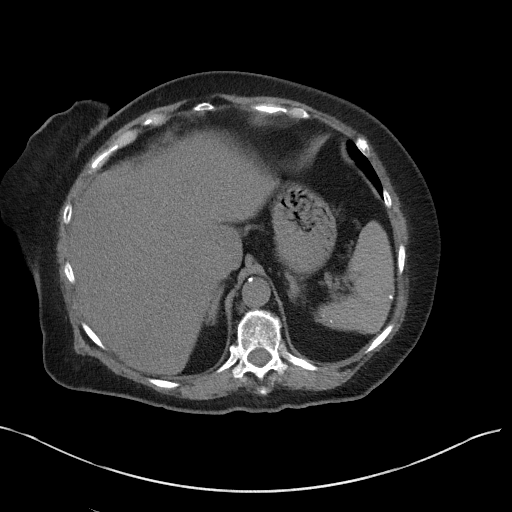
[im 81/92  soft-tissue]
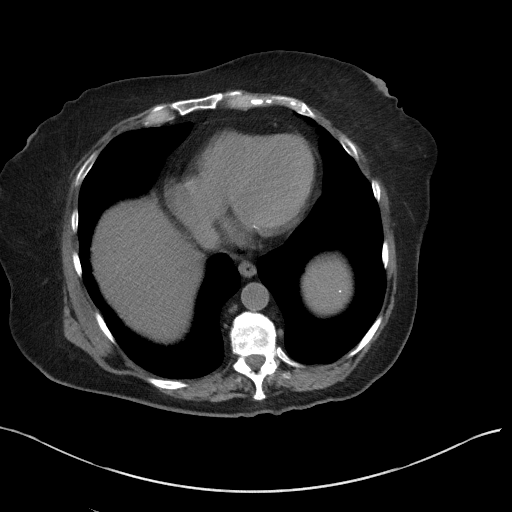
[im 88/92  soft-tissue]
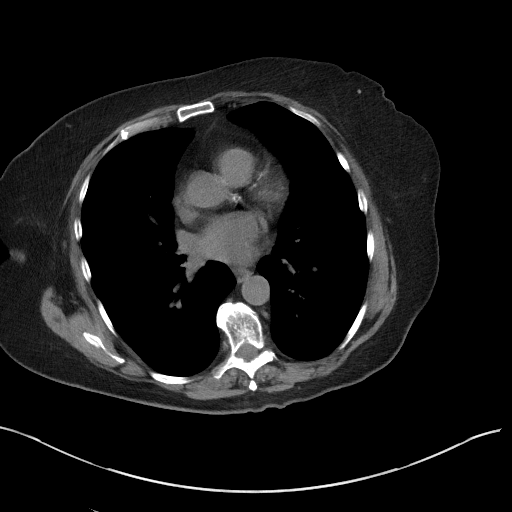

[Series 5: coronal st · coronal · 0.76mm/px · 3 of 97 slices shown]
[im 33/97  soft-tissue]
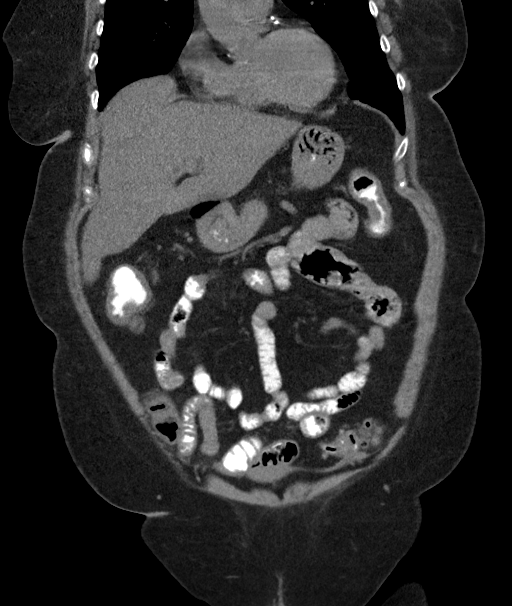
[im 43/97  soft-tissue]
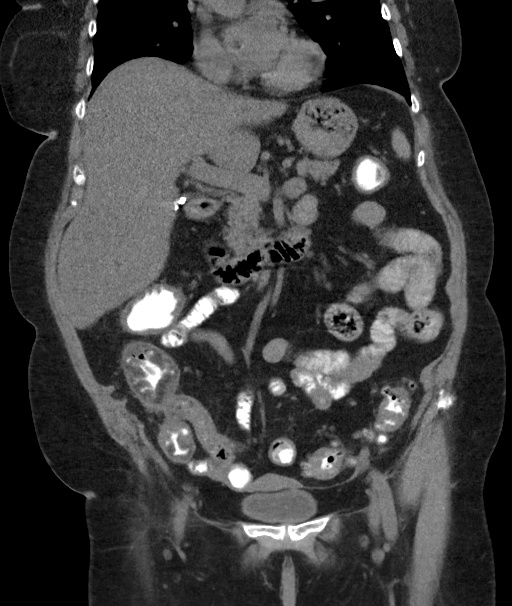
[im 54/97  soft-tissue]
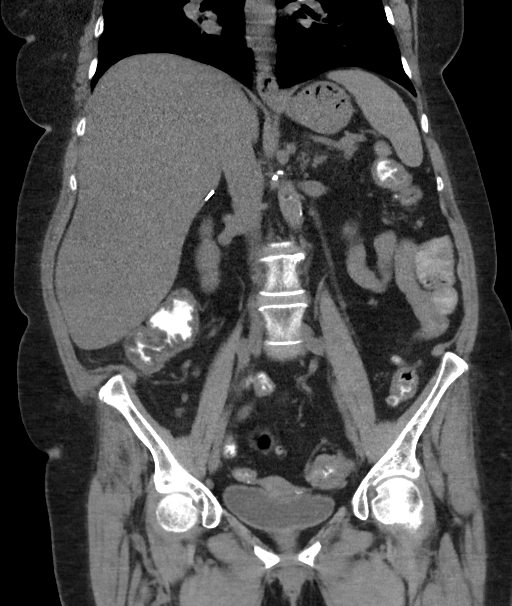

[16 of 46 positions shown; findings below may reference images not displayed]

FINDINGS: Lower chest: Calcific atherosclerotic disease of the coronary
arteries.

Hepatobiliary: No focal liver abnormality is seen. Status post
cholecystectomy. No biliary dilatation.

Pancreas: Unremarkable. No pancreatic ductal dilatation or
surrounding inflammatory changes.

Spleen: Splenic granulomata.

Adrenals/Urinary Tract: Adrenal glands are unremarkable. Kidneys are
normal, without renal calculi, focal lesion, or hydronephrosis.
Bladder is unremarkable.

Stomach/Bowel: Decompressed stomach. No small bowel obstruction.
Diffuse circumferential mucosal thickening of the cecum and sigmoid
colon, with less pronounced mucosal edema of the transverse colon.
Diffuse diverticulosis of the sigmoid colon. Normal appendix.

Vascular/Lymphatic: Aortic atherosclerosis. No enlarged abdominal or
pelvic lymph nodes. Shotty mesenteric lymph nodes, most pronounced
in the right lower quadrant.

Reproductive: Status post hysterectomy. No adnexal masses.

Other: No abdominal wall hernia or abnormality. No abdominopelvic
ascites.

Musculoskeletal: Spondylosis of the spine.
IMPRESSION: 1. Diffuse circumferential mucosal thickening of the cecum and
sigmoid colon, with less pronounced mucosal edema of the transverse
colon. Findings are consistent with colitis, likely infectious or
inflammatory.
2. Sigmoid diverticulosis, likely not the reason for colitis.
3. Shotty mesenteric lymph nodes, most pronounced in the right lower
quadrant, likely reactive.
4. Calcific atherosclerotic disease of the coronary arteries and
aorta.

## 2021-10-12 IMAGING — US US RENAL
1 series · 14 of 25 positions shown · non-contrast
Comparison: None.

CLINICAL DATA: Chronic renal disease

EXAM:
RENAL / URINARY TRACT ULTRASOUND COMPLETE

[Series 1: us renal · 0.26mm/px · 14 of 78 slices shown]
[im 1/78]
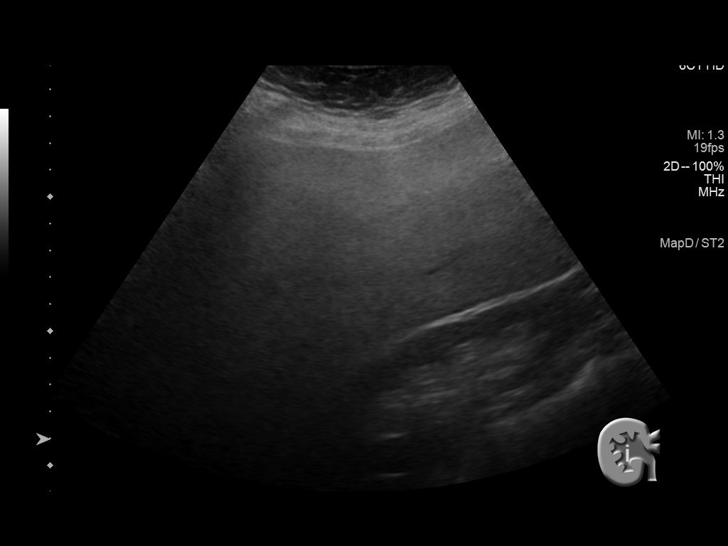
[im 7/78]
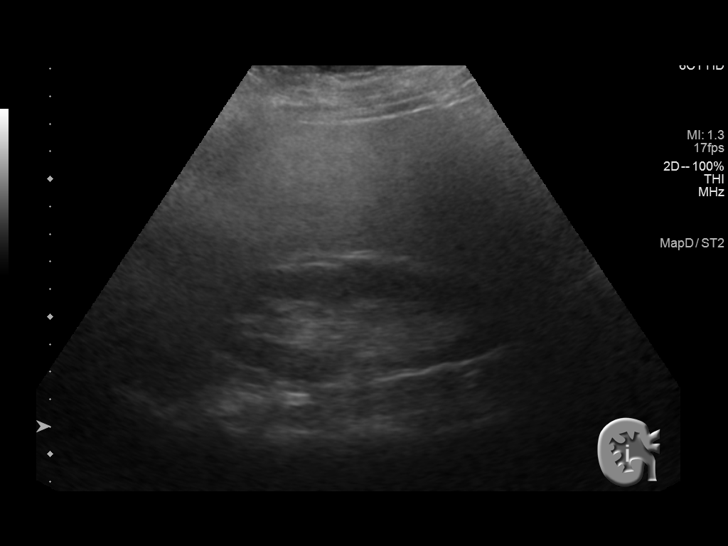
[im 13/78]
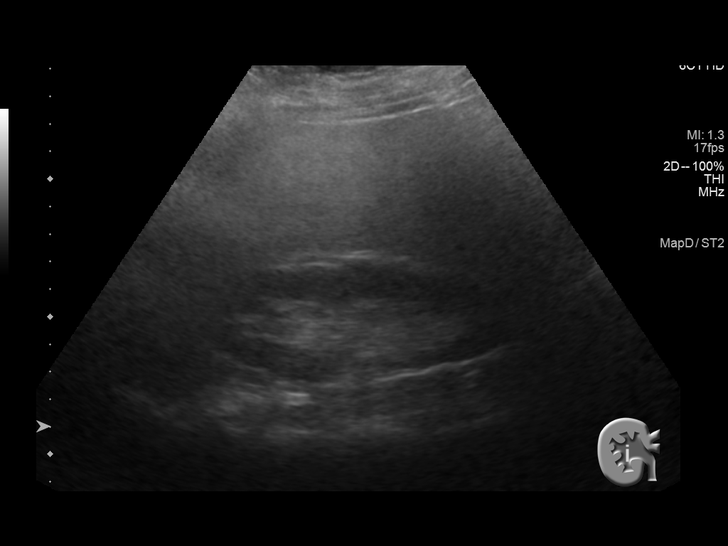
[im 20/78]
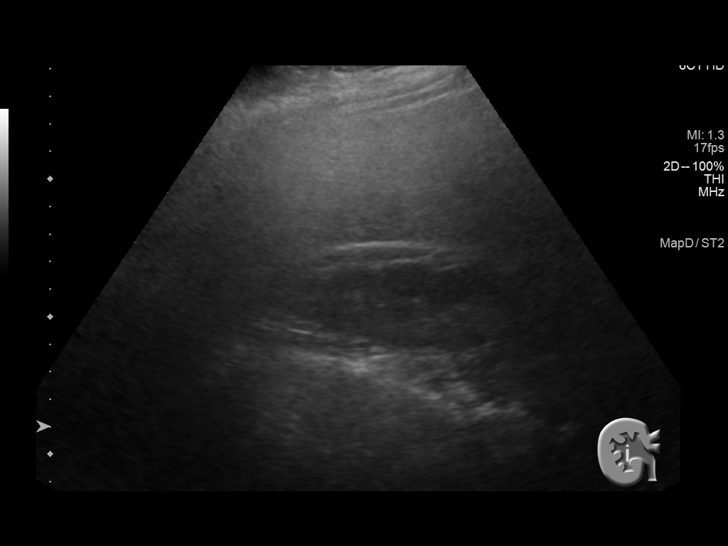
[im 26/78]
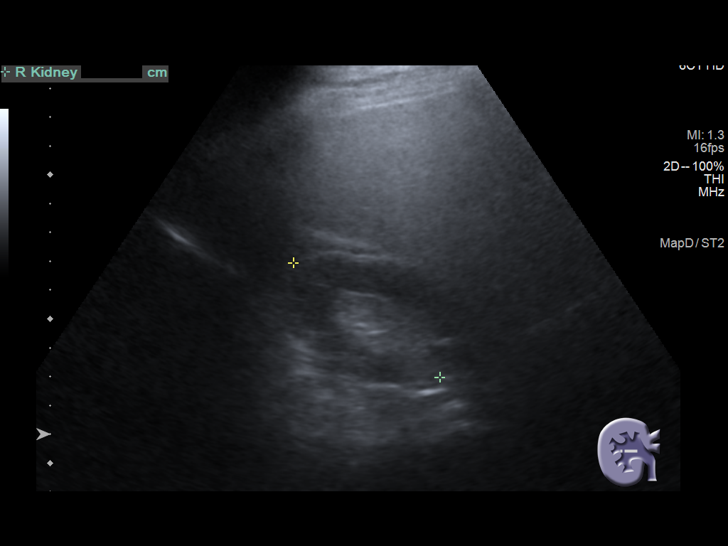
[im 29/78]
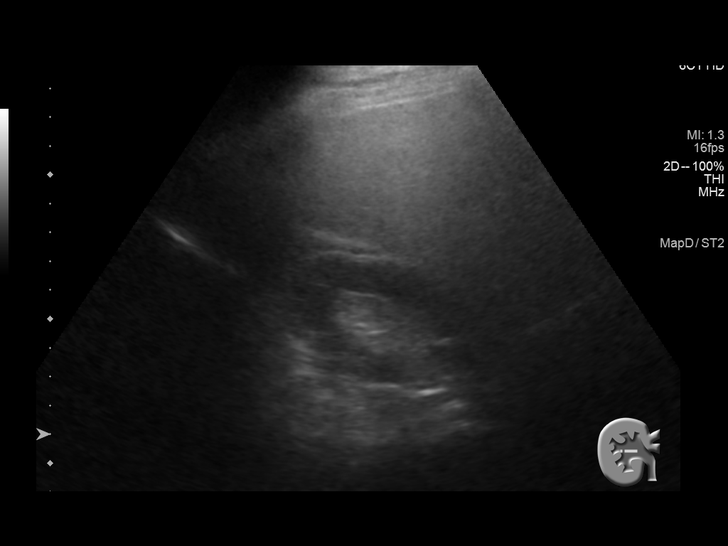
[im 36/78]
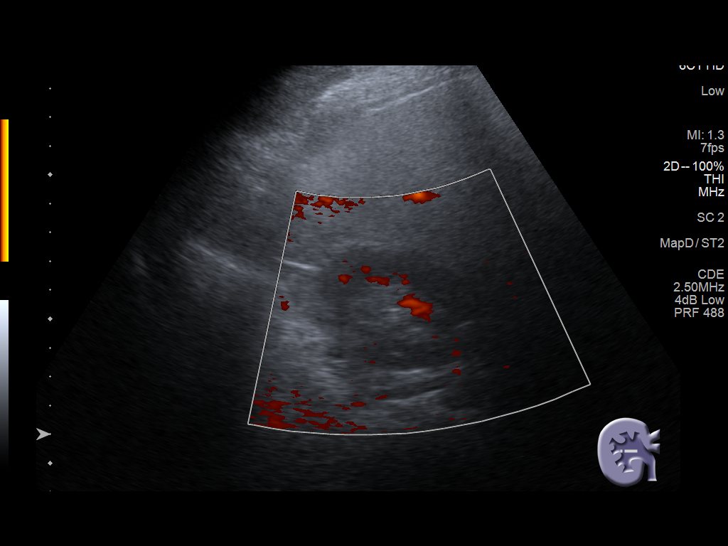
[im 42/78]
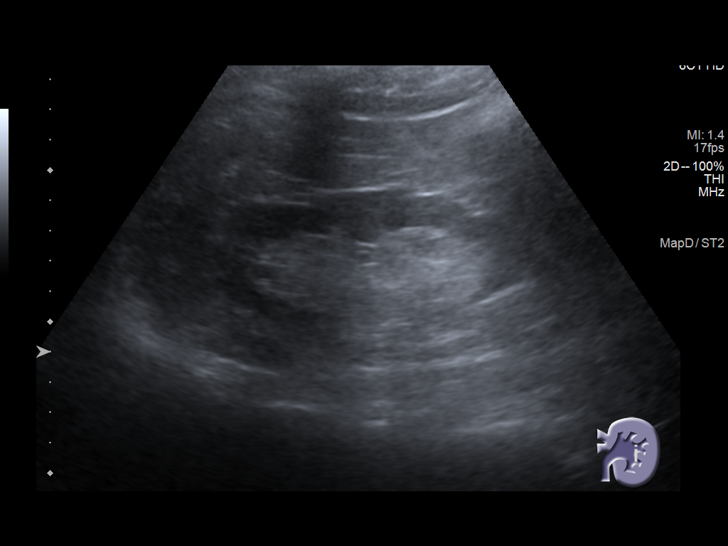
[im 49/78]
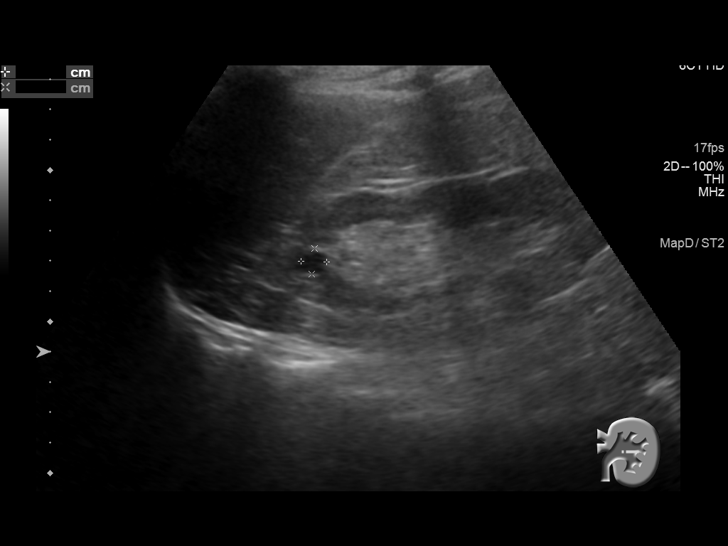
[im 52/78]
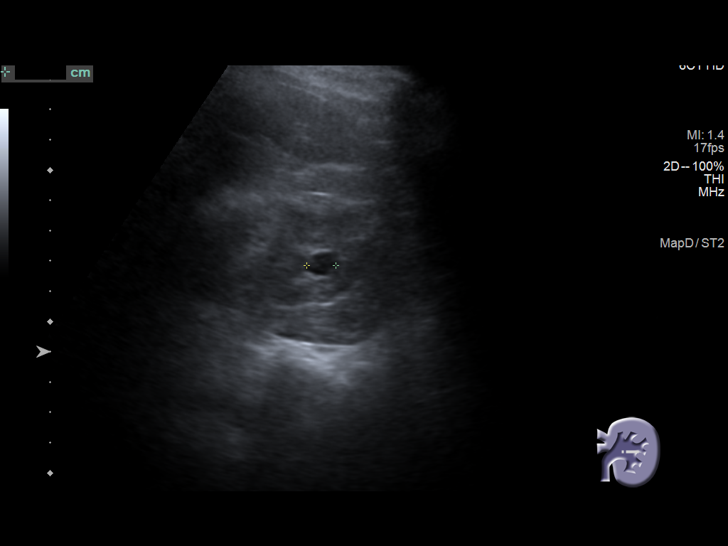
[im 58/78]
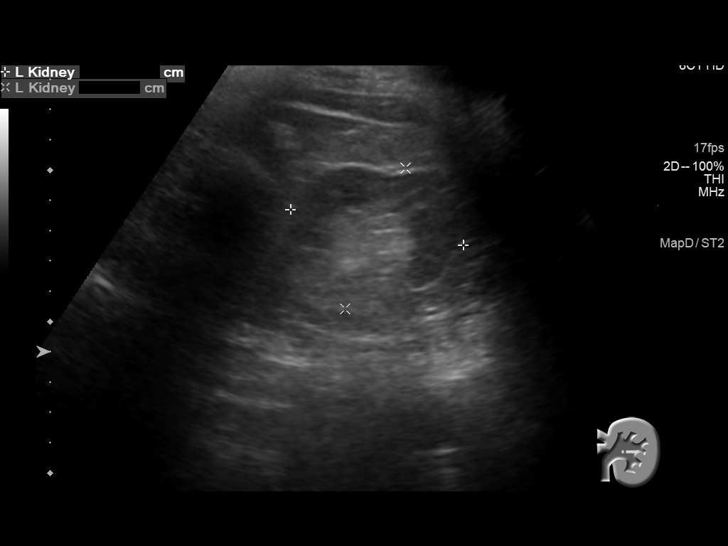
[im 65/78]
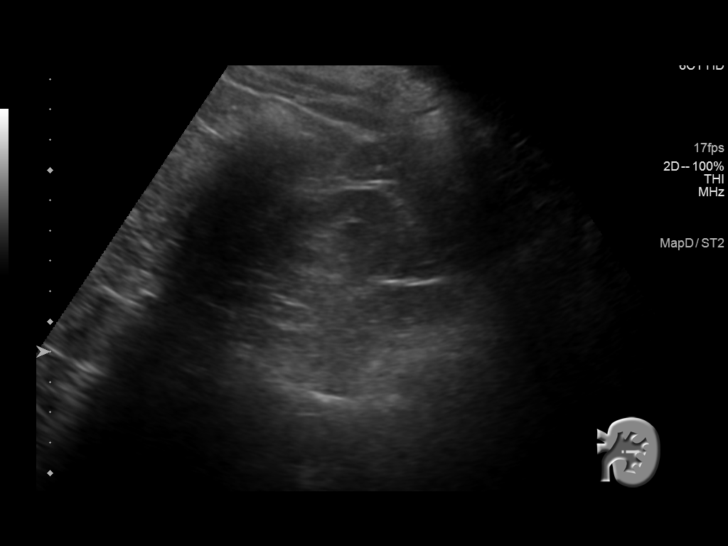
[im 71/78]
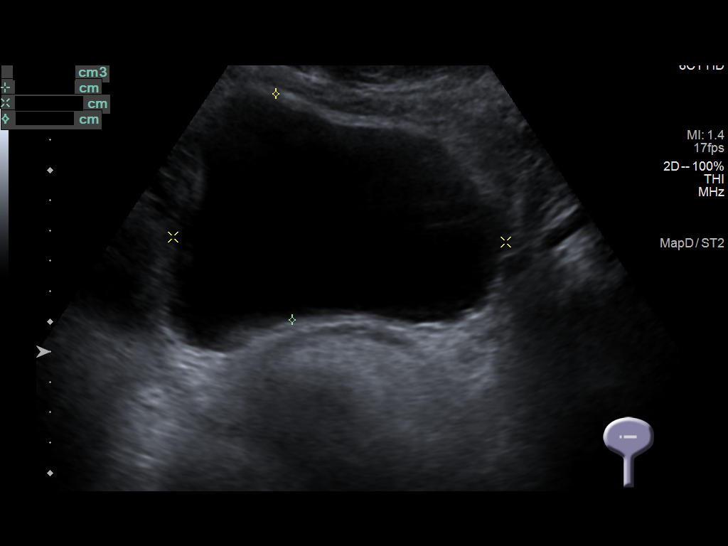
[im 78/78]
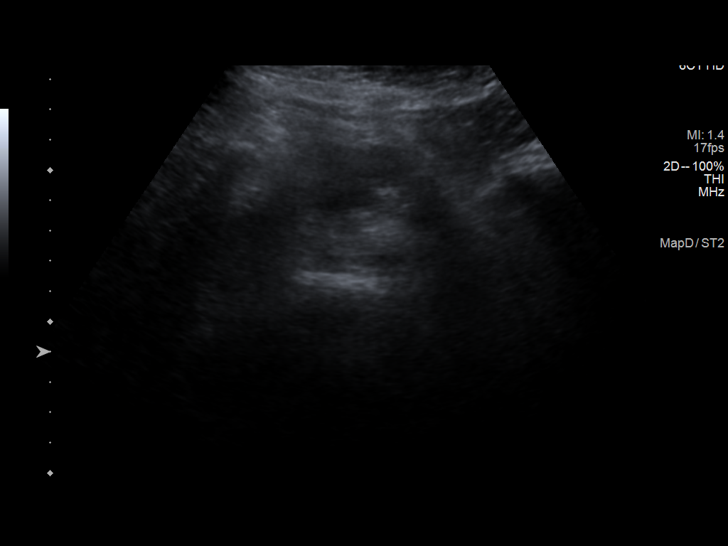

[14 of 25 positions shown; findings below may reference images not displayed]

FINDINGS: Right Kidney:

Renal measurements: 10.7 x 5.8 x 5.8 cm = volume: 165 mL .
Echogenicity within normal limits. No mass or hydronephrosis
visualized.

Left Kidney:

Renal measurements: 11.3 x 6.4 x 5.1 cm = volume: 159 mL. 9 mm cyst
is noted within the upper pole. No obstructive changes are seen.

Bladder:

Appears normal for degree of bladder distention.

Other:

None.
IMPRESSION: Small 9 mm left renal cyst.  No other focal abnormality is noted.
